# Patient Record
Sex: Male | Born: 1955
Health system: Southern US, Community
[De-identification: ages and names within clinical notes are randomized; demographics above are authoritative.]

## PROBLEM LIST (undated history)

## (undated) DIAGNOSIS — I10 Essential (primary) hypertension: Secondary | ICD-10-CM

## (undated) DIAGNOSIS — M1990 Unspecified osteoarthritis, unspecified site: Secondary | ICD-10-CM

## (undated) DIAGNOSIS — K219 Gastro-esophageal reflux disease without esophagitis: Secondary | ICD-10-CM

## (undated) DIAGNOSIS — E611 Iron deficiency: Secondary | ICD-10-CM

## (undated) DIAGNOSIS — I471 Supraventricular tachycardia: Secondary | ICD-10-CM

## (undated) DIAGNOSIS — I4719 Other supraventricular tachycardia: Secondary | ICD-10-CM

## (undated) DIAGNOSIS — E1129 Type 2 diabetes mellitus with other diabetic kidney complication: Secondary | ICD-10-CM

## (undated) DIAGNOSIS — E1139 Type 2 diabetes mellitus with other diabetic ophthalmic complication: Secondary | ICD-10-CM

## (undated) DIAGNOSIS — E785 Hyperlipidemia, unspecified: Secondary | ICD-10-CM

## (undated) DIAGNOSIS — I428 Other cardiomyopathies: Secondary | ICD-10-CM

## (undated) DIAGNOSIS — I509 Heart failure, unspecified: Secondary | ICD-10-CM

## (undated) DIAGNOSIS — N189 Chronic kidney disease, unspecified: Secondary | ICD-10-CM

## (undated) HISTORY — DX: Type 2 diabetes mellitus with other diabetic kidney complication: E11.29

## (undated) HISTORY — DX: Chronic kidney disease, unspecified: N18.9

## (undated) HISTORY — DX: Other cardiomyopathies: I42.8

## (undated) HISTORY — DX: Gastro-esophageal reflux disease without esophagitis: K21.9

## (undated) HISTORY — DX: Essential (primary) hypertension: I10

## (undated) HISTORY — DX: Other supraventricular tachycardia: I47.19

## (undated) HISTORY — DX: Hyperlipidemia, unspecified: E78.5

## (undated) HISTORY — DX: Supraventricular tachycardia: I47.1

## (undated) HISTORY — DX: Type 2 diabetes mellitus with other diabetic ophthalmic complication: E11.39

---
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
## 2001-01-14 ENCOUNTER — Emergency Department (HOSPITAL_COMMUNITY): Admission: EM | Admit: 2001-01-14 | Discharge: 2001-01-14 | Payer: Self-pay | Admitting: Emergency Medicine

## 2001-12-22 ENCOUNTER — Encounter: Admission: RE | Admit: 2001-12-22 | Discharge: 2001-12-22 | Payer: Self-pay | Admitting: Internal Medicine

## 2002-01-12 ENCOUNTER — Encounter: Admission: RE | Admit: 2002-01-12 | Discharge: 2002-01-12 | Payer: Self-pay | Admitting: Internal Medicine

## 2002-02-16 ENCOUNTER — Encounter: Admission: RE | Admit: 2002-02-16 | Discharge: 2002-02-16 | Payer: Self-pay | Admitting: Internal Medicine

## 2002-03-27 ENCOUNTER — Encounter: Admission: RE | Admit: 2002-03-27 | Discharge: 2002-03-27 | Payer: Self-pay | Admitting: Internal Medicine

## 2003-02-24 ENCOUNTER — Encounter: Admission: RE | Admit: 2003-02-24 | Discharge: 2003-02-24 | Payer: Self-pay | Admitting: Internal Medicine

## 2004-10-03 ENCOUNTER — Emergency Department (HOSPITAL_COMMUNITY): Admission: EM | Admit: 2004-10-03 | Discharge: 2004-10-03 | Payer: Self-pay | Admitting: Family Medicine

## 2004-10-04 ENCOUNTER — Emergency Department (HOSPITAL_COMMUNITY): Admission: EM | Admit: 2004-10-04 | Discharge: 2004-10-04 | Payer: Self-pay | Admitting: Family Medicine

## 2004-12-25 ENCOUNTER — Ambulatory Visit: Payer: Self-pay | Admitting: Internal Medicine

## 2004-12-29 ENCOUNTER — Ambulatory Visit: Payer: Self-pay | Admitting: Internal Medicine

## 2005-01-16 ENCOUNTER — Ambulatory Visit: Payer: Self-pay | Admitting: Internal Medicine

## 2006-04-22 DIAGNOSIS — E1165 Type 2 diabetes mellitus with hyperglycemia: Secondary | ICD-10-CM | POA: Insufficient documentation

## 2006-04-22 DIAGNOSIS — I428 Other cardiomyopathies: Secondary | ICD-10-CM | POA: Insufficient documentation

## 2006-04-22 DIAGNOSIS — E118 Type 2 diabetes mellitus with unspecified complications: Secondary | ICD-10-CM

## 2006-04-22 DIAGNOSIS — I10 Essential (primary) hypertension: Secondary | ICD-10-CM | POA: Insufficient documentation

## 2006-04-22 HISTORY — DX: Other cardiomyopathies: I42.8

## 2006-05-04 HISTORY — PX: CARDIAC CATHETERIZATION: SHX172

## 2006-05-10 ENCOUNTER — Inpatient Hospital Stay (HOSPITAL_COMMUNITY): Admission: EM | Admit: 2006-05-10 | Discharge: 2006-05-15 | Payer: Self-pay | Admitting: Family Medicine

## 2006-05-10 ENCOUNTER — Ambulatory Visit: Payer: Self-pay | Admitting: Internal Medicine

## 2006-05-13 ENCOUNTER — Encounter: Payer: Self-pay | Admitting: Vascular Surgery

## 2006-05-13 ENCOUNTER — Encounter: Payer: Self-pay | Admitting: Cardiology

## 2006-05-14 ENCOUNTER — Ambulatory Visit: Payer: Self-pay

## 2006-05-17 ENCOUNTER — Ambulatory Visit: Payer: Self-pay | Admitting: Internal Medicine

## 2006-05-22 ENCOUNTER — Encounter (INDEPENDENT_AMBULATORY_CARE_PROVIDER_SITE_OTHER): Payer: Self-pay | Admitting: *Deleted

## 2006-05-22 ENCOUNTER — Ambulatory Visit: Payer: Self-pay | Admitting: *Deleted

## 2006-05-22 LAB — CONVERTED CEMR LAB
CO2: 27 meq/L (ref 19–32)
Calcium: 9.8 mg/dL (ref 8.4–10.5)
Glucose, Bld: 180 mg/dL — ABNORMAL HIGH (ref 70–99)
Potassium: 4.1 meq/L (ref 3.5–5.3)

## 2006-05-23 ENCOUNTER — Ambulatory Visit: Payer: Self-pay | Admitting: Internal Medicine

## 2006-06-08 DIAGNOSIS — I471 Supraventricular tachycardia: Secondary | ICD-10-CM | POA: Insufficient documentation

## 2006-06-08 DIAGNOSIS — E785 Hyperlipidemia, unspecified: Secondary | ICD-10-CM | POA: Insufficient documentation

## 2006-06-08 DIAGNOSIS — K219 Gastro-esophageal reflux disease without esophagitis: Secondary | ICD-10-CM | POA: Insufficient documentation

## 2006-06-08 DIAGNOSIS — E1129 Type 2 diabetes mellitus with other diabetic kidney complication: Secondary | ICD-10-CM | POA: Insufficient documentation

## 2006-06-08 HISTORY — DX: Type 2 diabetes mellitus with other diabetic kidney complication: E11.29

## 2006-06-12 ENCOUNTER — Encounter (INDEPENDENT_AMBULATORY_CARE_PROVIDER_SITE_OTHER): Payer: Self-pay | Admitting: Internal Medicine

## 2006-06-24 ENCOUNTER — Ambulatory Visit: Payer: Self-pay | Admitting: Internal Medicine

## 2006-06-28 ENCOUNTER — Ambulatory Visit (HOSPITAL_COMMUNITY): Admission: RE | Admit: 2006-06-28 | Discharge: 2006-06-28 | Payer: Self-pay | Admitting: Internal Medicine

## 2006-06-28 ENCOUNTER — Ambulatory Visit: Payer: Self-pay | Admitting: Internal Medicine

## 2006-07-11 ENCOUNTER — Ambulatory Visit: Payer: Self-pay | Admitting: Internal Medicine

## 2006-07-15 ENCOUNTER — Ambulatory Visit: Payer: Self-pay | Admitting: Hospitalist

## 2006-07-17 ENCOUNTER — Encounter (INDEPENDENT_AMBULATORY_CARE_PROVIDER_SITE_OTHER): Payer: Self-pay | Admitting: *Deleted

## 2006-07-25 ENCOUNTER — Ambulatory Visit: Payer: Self-pay | Admitting: Internal Medicine

## 2006-07-25 LAB — CONVERTED CEMR LAB
BUN: 15 mg/dL (ref 6–23)
Basophils Absolute: 0.1 10*3/uL (ref 0.0–0.1)
Creatinine, Ser: 1.1 mg/dL (ref 0.4–1.5)
Eosinophils Absolute: 1.6 10*3/uL — ABNORMAL HIGH (ref 0.0–0.6)
GFR calc non Af Amer: 74 mL/min
MCHC: 33.3 g/dL (ref 30.0–36.0)
Monocytes Absolute: 1 10*3/uL — ABNORMAL HIGH (ref 0.2–0.7)
Monocytes Relative: 8.4 % (ref 3.0–11.0)
Potassium: 4.5 meq/L (ref 3.5–5.1)
Prothrombin Time: 11.7 s (ref 10.0–14.0)
RBC: 4.58 M/uL (ref 4.22–5.81)
RDW: 13 % (ref 11.5–14.6)
aPTT: 30 s (ref 26.5–36.5)

## 2006-07-30 ENCOUNTER — Telehealth (INDEPENDENT_AMBULATORY_CARE_PROVIDER_SITE_OTHER): Payer: Self-pay | Admitting: Pharmacy Technician

## 2006-07-31 ENCOUNTER — Telehealth (INDEPENDENT_AMBULATORY_CARE_PROVIDER_SITE_OTHER): Payer: Self-pay | Admitting: Pharmacy Technician

## 2006-08-01 ENCOUNTER — Ambulatory Visit: Payer: Self-pay | Admitting: Internal Medicine

## 2006-08-01 ENCOUNTER — Ambulatory Visit (HOSPITAL_COMMUNITY): Admission: RE | Admit: 2006-08-01 | Discharge: 2006-08-01 | Payer: Self-pay | Admitting: Internal Medicine

## 2006-08-16 ENCOUNTER — Ambulatory Visit: Payer: Self-pay

## 2006-08-16 ENCOUNTER — Encounter: Payer: Self-pay | Admitting: Cardiology

## 2006-08-20 ENCOUNTER — Ambulatory Visit: Payer: Self-pay | Admitting: Internal Medicine

## 2006-11-20 ENCOUNTER — Ambulatory Visit: Payer: Self-pay | Admitting: Internal Medicine

## 2006-12-11 ENCOUNTER — Telehealth (INDEPENDENT_AMBULATORY_CARE_PROVIDER_SITE_OTHER): Payer: Self-pay | Admitting: *Deleted

## 2007-02-06 ENCOUNTER — Encounter (INDEPENDENT_AMBULATORY_CARE_PROVIDER_SITE_OTHER): Payer: Self-pay | Admitting: *Deleted

## 2007-02-06 ENCOUNTER — Ambulatory Visit: Payer: Self-pay | Admitting: Internal Medicine

## 2007-02-06 DIAGNOSIS — E1139 Type 2 diabetes mellitus with other diabetic ophthalmic complication: Secondary | ICD-10-CM | POA: Insufficient documentation

## 2007-02-06 HISTORY — DX: Type 2 diabetes mellitus with other diabetic ophthalmic complication: E11.39

## 2007-02-06 LAB — CONVERTED CEMR LAB
Blood Glucose, Fingerstick: 429
Hgb A1c MFr Bld: >14 %

## 2007-02-11 ENCOUNTER — Ambulatory Visit: Payer: Self-pay | Admitting: Internal Medicine

## 2007-02-11 ENCOUNTER — Encounter (INDEPENDENT_AMBULATORY_CARE_PROVIDER_SITE_OTHER): Payer: Self-pay | Admitting: *Deleted

## 2007-02-11 LAB — CONVERTED CEMR LAB
ALT: 29 units/L (ref 0–53)
AST: 15 units/L (ref 0–37)
Alkaline Phosphatase: 116 units/L (ref 39–117)
LDL Cholesterol: 203 mg/dL — ABNORMAL HIGH (ref 0–99)
Sodium: 139 meq/L (ref 135–145)
Total Bilirubin: 0.7 mg/dL (ref 0.3–1.2)
Total CHOL/HDL Ratio: 5.4
Total Protein: 7.2 g/dL (ref 6.0–8.3)
VLDL: 45 mg/dL — ABNORMAL HIGH (ref 0–40)

## 2007-02-17 ENCOUNTER — Emergency Department (HOSPITAL_COMMUNITY): Admission: EM | Admit: 2007-02-17 | Discharge: 2007-02-17 | Payer: Self-pay | Admitting: Family Medicine

## 2007-06-18 ENCOUNTER — Ambulatory Visit: Payer: Self-pay | Admitting: Internal Medicine

## 2007-06-18 LAB — CONVERTED CEMR LAB
ALT: 21 units/L (ref 0–53)
AST: 21 units/L (ref 0–37)
Albumin: 3.8 g/dL (ref 3.5–5.2)
Bilirubin, Direct: 0.1 mg/dL (ref 0.0–0.3)
Calcium: 10.1 mg/dL (ref 8.4–10.5)
Chloride: 97 meq/L (ref 96–112)
Direct LDL: 247.6 mg/dL
GFR calc Af Amer: 81 mL/min
GFR calc non Af Amer: 67 mL/min
Glucose, Bld: 139 mg/dL — ABNORMAL HIGH (ref 70–99)
Hgb A1c MFr Bld: 12.9 % — ABNORMAL HIGH (ref 4.6–6.0)
Total CHOL/HDL Ratio: 8.3
Triglycerides: 281 mg/dL (ref 0–149)
VLDL: 56 mg/dL — ABNORMAL HIGH (ref 0–40)

## 2007-08-04 ENCOUNTER — Telehealth (INDEPENDENT_AMBULATORY_CARE_PROVIDER_SITE_OTHER): Payer: Self-pay | Admitting: *Deleted

## 2007-09-29 ENCOUNTER — Encounter (INDEPENDENT_AMBULATORY_CARE_PROVIDER_SITE_OTHER): Payer: Self-pay | Admitting: *Deleted

## 2008-01-22 ENCOUNTER — Ambulatory Visit: Payer: Self-pay | Admitting: Internal Medicine

## 2008-02-17 ENCOUNTER — Telehealth (INDEPENDENT_AMBULATORY_CARE_PROVIDER_SITE_OTHER): Payer: Self-pay | Admitting: Internal Medicine

## 2008-02-17 ENCOUNTER — Ambulatory Visit: Payer: Self-pay | Admitting: Gastroenterology

## 2008-02-23 ENCOUNTER — Ambulatory Visit: Payer: Self-pay | Admitting: Internal Medicine

## 2008-02-26 ENCOUNTER — Encounter: Payer: Self-pay | Admitting: Gastroenterology

## 2008-02-26 ENCOUNTER — Ambulatory Visit: Payer: Self-pay | Admitting: Gastroenterology

## 2008-02-26 ENCOUNTER — Encounter (INDEPENDENT_AMBULATORY_CARE_PROVIDER_SITE_OTHER): Payer: Self-pay | Admitting: Internal Medicine

## 2008-03-02 ENCOUNTER — Encounter: Payer: Self-pay | Admitting: Gastroenterology

## 2008-03-05 ENCOUNTER — Telehealth: Payer: Self-pay | Admitting: Internal Medicine

## 2008-04-19 ENCOUNTER — Ambulatory Visit: Payer: Self-pay | Admitting: Internal Medicine

## 2008-04-19 ENCOUNTER — Inpatient Hospital Stay (HOSPITAL_COMMUNITY): Admission: EM | Admit: 2008-04-19 | Discharge: 2008-04-26 | Payer: Self-pay | Admitting: Family Medicine

## 2008-04-19 LAB — CONVERTED CEMR LAB: Hgb A1c MFr Bld: 12 %

## 2008-04-20 ENCOUNTER — Ambulatory Visit: Payer: Self-pay | Admitting: Dentistry

## 2008-04-21 ENCOUNTER — Encounter (INDEPENDENT_AMBULATORY_CARE_PROVIDER_SITE_OTHER): Payer: Self-pay | Admitting: Internal Medicine

## 2008-04-21 ENCOUNTER — Ambulatory Visit: Payer: Self-pay | Admitting: Dentistry

## 2008-04-22 ENCOUNTER — Encounter (INDEPENDENT_AMBULATORY_CARE_PROVIDER_SITE_OTHER): Payer: Self-pay | Admitting: Internal Medicine

## 2008-05-03 ENCOUNTER — Encounter: Admission: RE | Admit: 2008-05-03 | Discharge: 2008-05-03 | Payer: Self-pay | Admitting: Dentistry

## 2008-05-04 ENCOUNTER — Telehealth (INDEPENDENT_AMBULATORY_CARE_PROVIDER_SITE_OTHER): Payer: Self-pay | Admitting: Internal Medicine

## 2008-05-04 ENCOUNTER — Ambulatory Visit: Payer: Self-pay | Admitting: Internal Medicine

## 2008-05-04 ENCOUNTER — Encounter (INDEPENDENT_AMBULATORY_CARE_PROVIDER_SITE_OTHER): Payer: Self-pay | Admitting: *Deleted

## 2008-05-04 LAB — CONVERTED CEMR LAB
Blood Glucose, Fingerstick: 330
CO2: 28 meq/L (ref 19–32)
Calcium: 9.6 mg/dL (ref 8.4–10.5)
Creatinine, Ser: 2.14 mg/dL — ABNORMAL HIGH (ref 0.40–1.50)
Creatinine, Urine: 89.3 mg/dL
Hemoglobin, Urine: NEGATIVE
Ketones, ur: NEGATIVE mg/dL
Leukocytes, UA: NEGATIVE
Microalb Creat Ratio: 314.7 mg/g — ABNORMAL HIGH (ref 0.0–30.0)
Microalb, Ur: 28.1 mg/dL — ABNORMAL HIGH (ref 0.00–1.89)
Nitrite: NEGATIVE
Protein, ur: 30 mg/dL — AB
Sodium: 134 meq/L — ABNORMAL LOW (ref 135–145)
Urobilinogen, UA: 0.2 (ref 0.0–1.0)
pH: 5.5 (ref 5.0–8.0)

## 2008-05-11 ENCOUNTER — Ambulatory Visit: Payer: Self-pay | Admitting: Internal Medicine

## 2008-05-13 ENCOUNTER — Telehealth (INDEPENDENT_AMBULATORY_CARE_PROVIDER_SITE_OTHER): Payer: Self-pay | Admitting: Internal Medicine

## 2008-05-20 ENCOUNTER — Encounter (INDEPENDENT_AMBULATORY_CARE_PROVIDER_SITE_OTHER): Payer: Self-pay | Admitting: Internal Medicine

## 2008-05-20 ENCOUNTER — Ambulatory Visit: Payer: Self-pay | Admitting: Internal Medicine

## 2008-05-20 ENCOUNTER — Encounter (INDEPENDENT_AMBULATORY_CARE_PROVIDER_SITE_OTHER): Payer: Self-pay | Admitting: *Deleted

## 2008-05-20 LAB — CONVERTED CEMR LAB
BUN: 41 mg/dL — ABNORMAL HIGH (ref 6–23)
CO2: 24 meq/L (ref 19–32)
Chloride: 102 meq/L (ref 96–112)
Glucose, Bld: 131 mg/dL — ABNORMAL HIGH (ref 70–99)
Potassium: 4.5 meq/L (ref 3.5–5.3)
Sodium: 139 meq/L (ref 135–145)

## 2008-05-31 ENCOUNTER — Ambulatory Visit: Payer: Self-pay | Admitting: Infectious Diseases

## 2008-05-31 ENCOUNTER — Encounter (INDEPENDENT_AMBULATORY_CARE_PROVIDER_SITE_OTHER): Payer: Self-pay | Admitting: Internal Medicine

## 2008-05-31 LAB — CONVERTED CEMR LAB: Blood Glucose, Fingerstick: 250

## 2008-06-02 ENCOUNTER — Encounter: Admission: RE | Admit: 2008-06-02 | Discharge: 2008-06-03 | Payer: Self-pay | Admitting: Internal Medicine

## 2008-06-16 ENCOUNTER — Ambulatory Visit: Payer: Self-pay | Admitting: Internal Medicine

## 2008-06-16 ENCOUNTER — Encounter (INDEPENDENT_AMBULATORY_CARE_PROVIDER_SITE_OTHER): Payer: Self-pay | Admitting: Internal Medicine

## 2008-06-16 LAB — CONVERTED CEMR LAB
Calcium: 9.8 mg/dL (ref 8.4–10.5)
Creatinine, Urine: 90.9 mg/dL
Glucose, Bld: 229 mg/dL — ABNORMAL HIGH (ref 70–99)
Hgb A1c MFr Bld: 9.9 %
Microalb, Ur: 9.07 mg/dL — ABNORMAL HIGH (ref 0.00–1.89)
Potassium: 4.1 meq/L (ref 3.5–5.3)
Sodium: 143 meq/L (ref 135–145)

## 2008-06-23 ENCOUNTER — Ambulatory Visit: Payer: Self-pay | Admitting: Internal Medicine

## 2008-06-23 ENCOUNTER — Encounter (INDEPENDENT_AMBULATORY_CARE_PROVIDER_SITE_OTHER): Payer: Self-pay | Admitting: Internal Medicine

## 2008-06-25 ENCOUNTER — Encounter (INDEPENDENT_AMBULATORY_CARE_PROVIDER_SITE_OTHER): Payer: Self-pay | Admitting: *Deleted

## 2008-06-25 ENCOUNTER — Encounter (INDEPENDENT_AMBULATORY_CARE_PROVIDER_SITE_OTHER): Payer: Self-pay | Admitting: Internal Medicine

## 2008-06-25 LAB — CONVERTED CEMR LAB
BUN: 42 mg/dL — ABNORMAL HIGH (ref 6–23)
Calcium: 8.8 mg/dL (ref 8.4–10.5)
Glucose, Bld: 220 mg/dL — ABNORMAL HIGH (ref 70–99)
Potassium: 4.3 meq/L (ref 3.5–5.3)
Sodium: 140 meq/L (ref 135–145)

## 2008-06-30 ENCOUNTER — Ambulatory Visit: Payer: Self-pay | Admitting: Internal Medicine

## 2008-06-30 ENCOUNTER — Encounter (INDEPENDENT_AMBULATORY_CARE_PROVIDER_SITE_OTHER): Payer: Self-pay | Admitting: Internal Medicine

## 2008-06-30 LAB — CONVERTED CEMR LAB
BUN: 46 mg/dL — ABNORMAL HIGH (ref 6–23)
Blood Glucose, Fingerstick: 231
CO2: 22 meq/L (ref 19–32)
Calcium: 9.1 mg/dL (ref 8.4–10.5)
Creatinine, Ser: 1.92 mg/dL — ABNORMAL HIGH (ref 0.40–1.50)

## 2008-07-12 ENCOUNTER — Ambulatory Visit: Payer: Self-pay | Admitting: Internal Medicine

## 2008-07-12 LAB — CONVERTED CEMR LAB

## 2008-07-14 ENCOUNTER — Encounter (INDEPENDENT_AMBULATORY_CARE_PROVIDER_SITE_OTHER): Payer: Self-pay | Admitting: Internal Medicine

## 2008-07-19 ENCOUNTER — Ambulatory Visit: Payer: Self-pay | Admitting: *Deleted

## 2008-07-29 ENCOUNTER — Encounter (INDEPENDENT_AMBULATORY_CARE_PROVIDER_SITE_OTHER): Payer: Self-pay | Admitting: Internal Medicine

## 2008-07-29 ENCOUNTER — Ambulatory Visit: Payer: Self-pay | Admitting: Internal Medicine

## 2008-07-29 LAB — CONVERTED CEMR LAB
BUN: 29 mg/dL — ABNORMAL HIGH (ref 6–23)
CO2: 23 meq/L (ref 19–32)
Chloride: 107 meq/L (ref 96–112)
Creatinine, Ser: 1.55 mg/dL — ABNORMAL HIGH (ref 0.40–1.50)
Glucose, Bld: 62 mg/dL — ABNORMAL LOW (ref 70–99)

## 2008-08-18 ENCOUNTER — Encounter (INDEPENDENT_AMBULATORY_CARE_PROVIDER_SITE_OTHER): Payer: Self-pay | Admitting: Internal Medicine

## 2008-08-19 ENCOUNTER — Ambulatory Visit: Payer: Self-pay | Admitting: Internal Medicine

## 2008-08-23 ENCOUNTER — Ambulatory Visit: Payer: Self-pay | Admitting: Internal Medicine

## 2008-08-23 ENCOUNTER — Encounter (INDEPENDENT_AMBULATORY_CARE_PROVIDER_SITE_OTHER): Payer: Self-pay | Admitting: Internal Medicine

## 2008-08-23 LAB — CONVERTED CEMR LAB
BUN: 42 mg/dL — ABNORMAL HIGH (ref 6–23)
Blood Glucose, Fingerstick: 155
CO2: 23 meq/L (ref 19–32)
Calcium: 9.3 mg/dL (ref 8.4–10.5)
Creatinine, Ser: 1.84 mg/dL — ABNORMAL HIGH (ref 0.40–1.50)
Glucose, Bld: 149 mg/dL — ABNORMAL HIGH (ref 70–99)
Total Bilirubin: 0.3 mg/dL (ref 0.3–1.2)

## 2008-08-25 ENCOUNTER — Telehealth (INDEPENDENT_AMBULATORY_CARE_PROVIDER_SITE_OTHER): Payer: Self-pay | Admitting: Internal Medicine

## 2008-09-01 ENCOUNTER — Encounter (INDEPENDENT_AMBULATORY_CARE_PROVIDER_SITE_OTHER): Payer: Self-pay | Admitting: Internal Medicine

## 2008-09-06 ENCOUNTER — Telehealth (INDEPENDENT_AMBULATORY_CARE_PROVIDER_SITE_OTHER): Payer: Self-pay | Admitting: Internal Medicine

## 2008-10-14 ENCOUNTER — Telehealth (INDEPENDENT_AMBULATORY_CARE_PROVIDER_SITE_OTHER): Payer: Self-pay | Admitting: Internal Medicine

## 2008-11-02 ENCOUNTER — Telehealth (INDEPENDENT_AMBULATORY_CARE_PROVIDER_SITE_OTHER): Payer: Self-pay | Admitting: Internal Medicine

## 2008-12-10 ENCOUNTER — Telehealth (INDEPENDENT_AMBULATORY_CARE_PROVIDER_SITE_OTHER): Payer: Self-pay | Admitting: Internal Medicine

## 2008-12-22 ENCOUNTER — Encounter (INDEPENDENT_AMBULATORY_CARE_PROVIDER_SITE_OTHER): Payer: Self-pay | Admitting: Internal Medicine

## 2008-12-22 ENCOUNTER — Ambulatory Visit: Payer: Self-pay | Admitting: Internal Medicine

## 2008-12-22 LAB — CONVERTED CEMR LAB
Blood Glucose, Fingerstick: 307
Creatinine, Urine: 218.6 mg/dL
Hgb A1c MFr Bld: 9.5 %
Microalb Creat Ratio: 45.5 mg/g — ABNORMAL HIGH (ref 0.0–30.0)
Microalb, Ur: 9.95 mg/dL — ABNORMAL HIGH (ref 0.00–1.89)

## 2008-12-29 ENCOUNTER — Ambulatory Visit: Payer: Self-pay | Admitting: Internal Medicine

## 2008-12-29 ENCOUNTER — Encounter (INDEPENDENT_AMBULATORY_CARE_PROVIDER_SITE_OTHER): Payer: Self-pay | Admitting: Internal Medicine

## 2008-12-31 LAB — CONVERTED CEMR LAB
ALT: 12 units/L (ref 0–53)
AST: 10 units/L (ref 0–37)
BUN: 17 mg/dL (ref 6–23)
Creatinine, Ser: 1.21 mg/dL (ref 0.40–1.50)
HDL: 48 mg/dL (ref >39)
TSH: 1.206 microintl units/mL (ref 0.350–4.5)
Total Bilirubin: 0.3 mg/dL (ref 0.3–1.2)
Total CHOL/HDL Ratio: 5.9
VLDL: 29 mg/dL (ref 0–40)

## 2009-01-05 ENCOUNTER — Ambulatory Visit: Payer: Self-pay | Admitting: Internal Medicine

## 2009-01-05 ENCOUNTER — Encounter (INDEPENDENT_AMBULATORY_CARE_PROVIDER_SITE_OTHER): Payer: Self-pay | Admitting: Internal Medicine

## 2009-01-05 LAB — CONVERTED CEMR LAB: Blood Glucose, Home Monitor: 3 mg/dL

## 2009-01-06 ENCOUNTER — Telehealth (INDEPENDENT_AMBULATORY_CARE_PROVIDER_SITE_OTHER): Payer: Self-pay | Admitting: Internal Medicine

## 2009-01-24 ENCOUNTER — Ambulatory Visit: Payer: Self-pay | Admitting: Internal Medicine

## 2009-01-31 ENCOUNTER — Encounter (INDEPENDENT_AMBULATORY_CARE_PROVIDER_SITE_OTHER): Payer: Self-pay | Admitting: Internal Medicine

## 2009-02-14 ENCOUNTER — Telehealth (INDEPENDENT_AMBULATORY_CARE_PROVIDER_SITE_OTHER): Payer: Self-pay | Admitting: Internal Medicine

## 2009-02-16 ENCOUNTER — Encounter (INDEPENDENT_AMBULATORY_CARE_PROVIDER_SITE_OTHER): Payer: Self-pay | Admitting: *Deleted

## 2009-02-21 ENCOUNTER — Telehealth: Payer: Self-pay | Admitting: Internal Medicine

## 2009-03-04 ENCOUNTER — Encounter (INDEPENDENT_AMBULATORY_CARE_PROVIDER_SITE_OTHER): Payer: Self-pay | Admitting: Internal Medicine

## 2009-03-10 ENCOUNTER — Telehealth: Payer: Self-pay | Admitting: Internal Medicine

## 2009-03-15 ENCOUNTER — Ambulatory Visit (HOSPITAL_COMMUNITY): Admission: RE | Admit: 2009-03-15 | Discharge: 2009-03-15 | Payer: Self-pay | Admitting: Internal Medicine

## 2009-03-15 ENCOUNTER — Ambulatory Visit: Payer: Self-pay | Admitting: Internal Medicine

## 2009-03-15 LAB — CONVERTED CEMR LAB
Blood Glucose, Fingerstick: 380
Hgb A1c MFr Bld: 9.6 %

## 2009-03-16 DIAGNOSIS — M1990 Unspecified osteoarthritis, unspecified site: Secondary | ICD-10-CM | POA: Insufficient documentation

## 2009-03-21 ENCOUNTER — Encounter (INDEPENDENT_AMBULATORY_CARE_PROVIDER_SITE_OTHER): Payer: Self-pay | Admitting: Internal Medicine

## 2009-03-23 ENCOUNTER — Ambulatory Visit: Payer: Self-pay | Admitting: Internal Medicine

## 2009-03-23 ENCOUNTER — Telehealth (INDEPENDENT_AMBULATORY_CARE_PROVIDER_SITE_OTHER): Payer: Self-pay | Admitting: Internal Medicine

## 2009-03-24 ENCOUNTER — Encounter (INDEPENDENT_AMBULATORY_CARE_PROVIDER_SITE_OTHER): Payer: Self-pay | Admitting: Internal Medicine

## 2009-03-29 ENCOUNTER — Ambulatory Visit: Payer: Self-pay | Admitting: Internal Medicine

## 2009-03-29 DIAGNOSIS — E669 Obesity, unspecified: Secondary | ICD-10-CM | POA: Insufficient documentation

## 2009-03-29 LAB — CONVERTED CEMR LAB
AST: 19 units/L (ref 0–37)
Albumin: 4.3 g/dL (ref 3.5–5.2)
BUN: 26 mg/dL — ABNORMAL HIGH (ref 6–23)
Calcium: 10.1 mg/dL (ref 8.4–10.5)
Chloride: 104 meq/L (ref 96–112)
Creatinine, Ser: 1.41 mg/dL (ref 0.40–1.50)
Glucose, Bld: 62 mg/dL — ABNORMAL LOW (ref 70–99)
HDL: 46 mg/dL (ref >39)
Potassium: 4.1 meq/L (ref 3.5–5.3)
Total CHOL/HDL Ratio: 3.5
Triglycerides: 131 mg/dL (ref <150)

## 2009-03-31 ENCOUNTER — Encounter (INDEPENDENT_AMBULATORY_CARE_PROVIDER_SITE_OTHER): Payer: Self-pay | Admitting: Internal Medicine

## 2009-04-01 ENCOUNTER — Telehealth (INDEPENDENT_AMBULATORY_CARE_PROVIDER_SITE_OTHER): Payer: Self-pay | Admitting: Internal Medicine

## 2009-04-22 ENCOUNTER — Telehealth (INDEPENDENT_AMBULATORY_CARE_PROVIDER_SITE_OTHER): Payer: Self-pay | Admitting: Internal Medicine

## 2009-05-31 ENCOUNTER — Telehealth (INDEPENDENT_AMBULATORY_CARE_PROVIDER_SITE_OTHER): Payer: Self-pay | Admitting: Internal Medicine

## 2009-06-01 ENCOUNTER — Telehealth (INDEPENDENT_AMBULATORY_CARE_PROVIDER_SITE_OTHER): Payer: Self-pay | Admitting: Internal Medicine

## 2009-11-29 ENCOUNTER — Telehealth (INDEPENDENT_AMBULATORY_CARE_PROVIDER_SITE_OTHER): Payer: Self-pay | Admitting: *Deleted

## 2009-12-27 ENCOUNTER — Telehealth (INDEPENDENT_AMBULATORY_CARE_PROVIDER_SITE_OTHER): Payer: Self-pay | Admitting: *Deleted

## 2010-01-26 ENCOUNTER — Telehealth: Payer: Self-pay | Admitting: Internal Medicine

## 2010-02-02 ENCOUNTER — Encounter: Payer: Self-pay | Admitting: Internal Medicine

## 2010-02-20 ENCOUNTER — Ambulatory Visit: Payer: Self-pay | Admitting: Internal Medicine

## 2010-02-20 LAB — CONVERTED CEMR LAB
Blood Glucose, Fingerstick: 347
Hgb A1c MFr Bld: 8.6 %

## 2010-02-22 ENCOUNTER — Ambulatory Visit: Payer: Self-pay | Admitting: Internal Medicine

## 2010-02-24 ENCOUNTER — Encounter: Payer: Self-pay | Admitting: Internal Medicine

## 2010-02-24 LAB — CONVERTED CEMR LAB
ALT: 13 units/L (ref 0–53)
AST: 12 units/L (ref 0–37)
CO2: 26 meq/L (ref 19–32)
Calcium: 9.6 mg/dL (ref 8.4–10.5)
Chloride: 99 meq/L (ref 96–112)
Cholesterol: 341 mg/dL — ABNORMAL HIGH (ref 0–200)
Creatinine, Ser: 1.31 mg/dL (ref 0.40–1.50)
Creatinine, Urine: 163.2 mg/dL
HCT: 43.8 % (ref 39.0–52.0)
Microalb Creat Ratio: 1192.6 mg/g — ABNORMAL HIGH (ref 0.0–30.0)
Microalb, Ur: 194.64 mg/dL — ABNORMAL HIGH (ref 0.00–1.89)
Platelets: 192 10*3/uL (ref 150–400)
Potassium: 4.7 meq/L (ref 3.5–5.3)
RDW: 12.9 % (ref 11.5–15.5)
Sodium: 137 meq/L (ref 135–145)
Total CHOL/HDL Ratio: 7.6
Total Protein: 7.1 g/dL (ref 6.0–8.3)
WBC: 7.3 10*3/uL (ref 4.0–10.5)

## 2010-03-06 ENCOUNTER — Encounter: Payer: Self-pay | Admitting: Internal Medicine

## 2010-03-17 ENCOUNTER — Ambulatory Visit: Payer: Self-pay | Admitting: Internal Medicine

## 2010-03-17 LAB — CONVERTED CEMR LAB: Blood Glucose, Fingerstick: 239

## 2010-03-18 ENCOUNTER — Encounter: Payer: Self-pay | Admitting: Internal Medicine

## 2010-03-21 ENCOUNTER — Telehealth: Payer: Self-pay | Admitting: *Deleted

## 2010-04-04 ENCOUNTER — Encounter: Payer: Self-pay | Admitting: Internal Medicine

## 2010-04-04 ENCOUNTER — Ambulatory Visit: Payer: Self-pay | Admitting: Internal Medicine

## 2010-04-04 LAB — CONVERTED CEMR LAB
ALT: 14 units/L (ref 0–53)
CO2: 27 meq/L (ref 19–32)
Calcium: 8.9 mg/dL (ref 8.4–10.5)
Chloride: 98 meq/L (ref 96–112)
Creatinine, Ser: 2.36 mg/dL — ABNORMAL HIGH (ref 0.40–1.50)
Glucose, Bld: 301 mg/dL — ABNORMAL HIGH (ref 70–99)
Sodium: 140 meq/L (ref 135–145)
Total Protein: 6.2 g/dL (ref 6.0–8.3)

## 2010-04-05 ENCOUNTER — Telehealth (INDEPENDENT_AMBULATORY_CARE_PROVIDER_SITE_OTHER): Payer: Self-pay | Admitting: *Deleted

## 2010-04-09 ENCOUNTER — Telehealth: Payer: Self-pay | Admitting: Internal Medicine

## 2010-04-10 ENCOUNTER — Ambulatory Visit: Payer: Self-pay | Admitting: Internal Medicine

## 2010-04-10 ENCOUNTER — Encounter: Payer: Self-pay | Admitting: Internal Medicine

## 2010-04-10 DIAGNOSIS — N179 Acute kidney failure, unspecified: Secondary | ICD-10-CM | POA: Insufficient documentation

## 2010-04-10 LAB — CONVERTED CEMR LAB
Blood Glucose, Fingerstick: 210
CO2: 26 meq/L (ref 19–32)
Calcium: 9.3 mg/dL (ref 8.4–10.5)
Calcium: 9.6 mg/dL (ref 8.4–10.5)
Creatinine, Ser: 1.22 mg/dL (ref 0.40–1.50)
Potassium: 4.2 meq/L (ref 3.5–5.3)
Sodium: 144 meq/L (ref 135–145)
Sodium: 140 meq/L (ref 135–145)

## 2010-04-13 ENCOUNTER — Ambulatory Visit: Payer: Self-pay | Admitting: Internal Medicine

## 2010-04-14 ENCOUNTER — Encounter: Payer: Self-pay | Admitting: Internal Medicine

## 2010-04-14 ENCOUNTER — Ambulatory Visit: Payer: Self-pay | Admitting: Cardiology

## 2010-04-14 ENCOUNTER — Ambulatory Visit (HOSPITAL_COMMUNITY): Admission: RE | Admit: 2010-04-14 | Discharge: 2010-04-14 | Payer: Self-pay | Admitting: Internal Medicine

## 2010-04-14 ENCOUNTER — Ambulatory Visit: Payer: Self-pay

## 2010-04-19 IMAGING — CR DG CHEST 2V
2 series · 2 of 2 positions shown · non-contrast
Comparison: 05/10/2006.

CLINICAL DATA: Facial abscess.  Preoperative evaluation.

CHEST - 2 VIEW

[w chest pa]
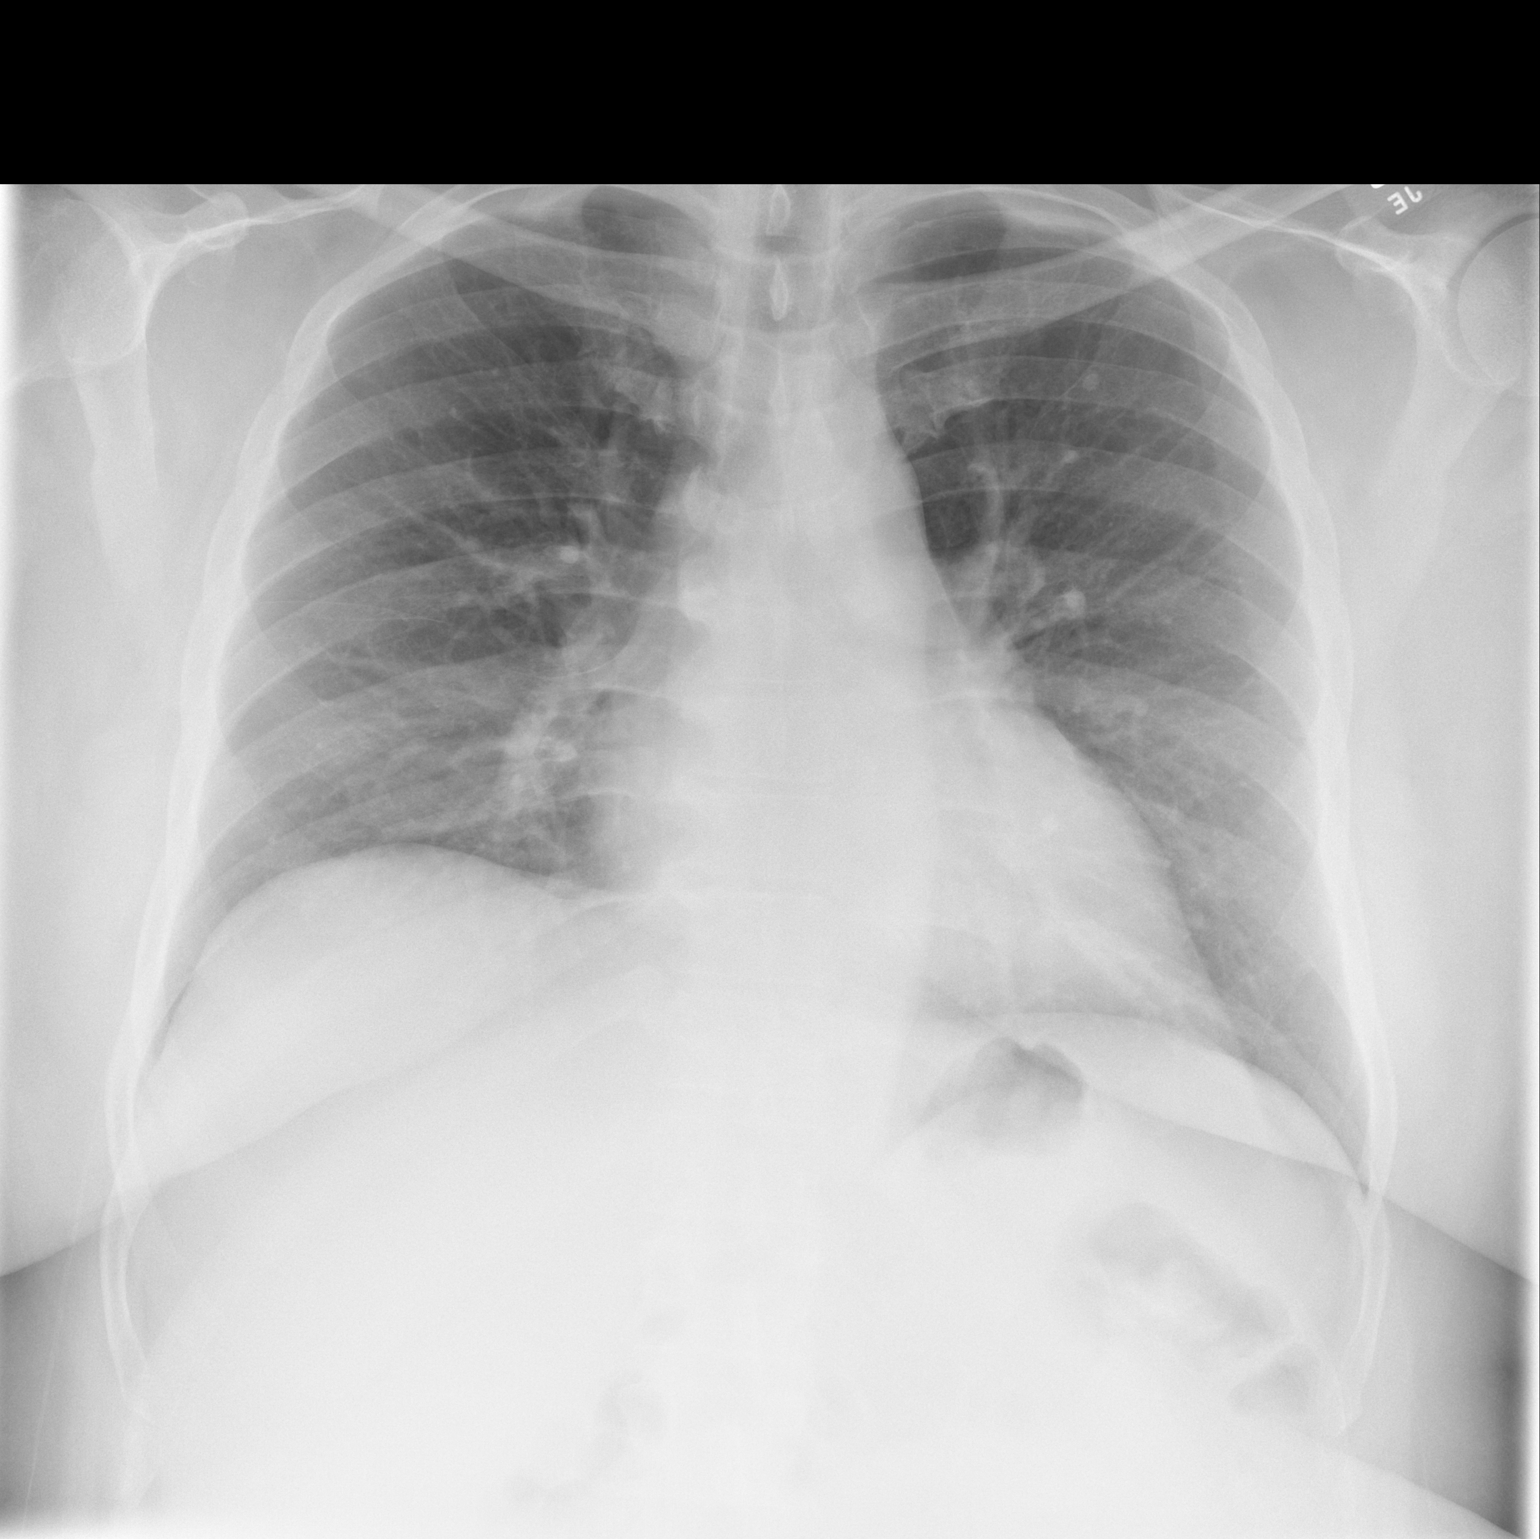

[w chest lat *]
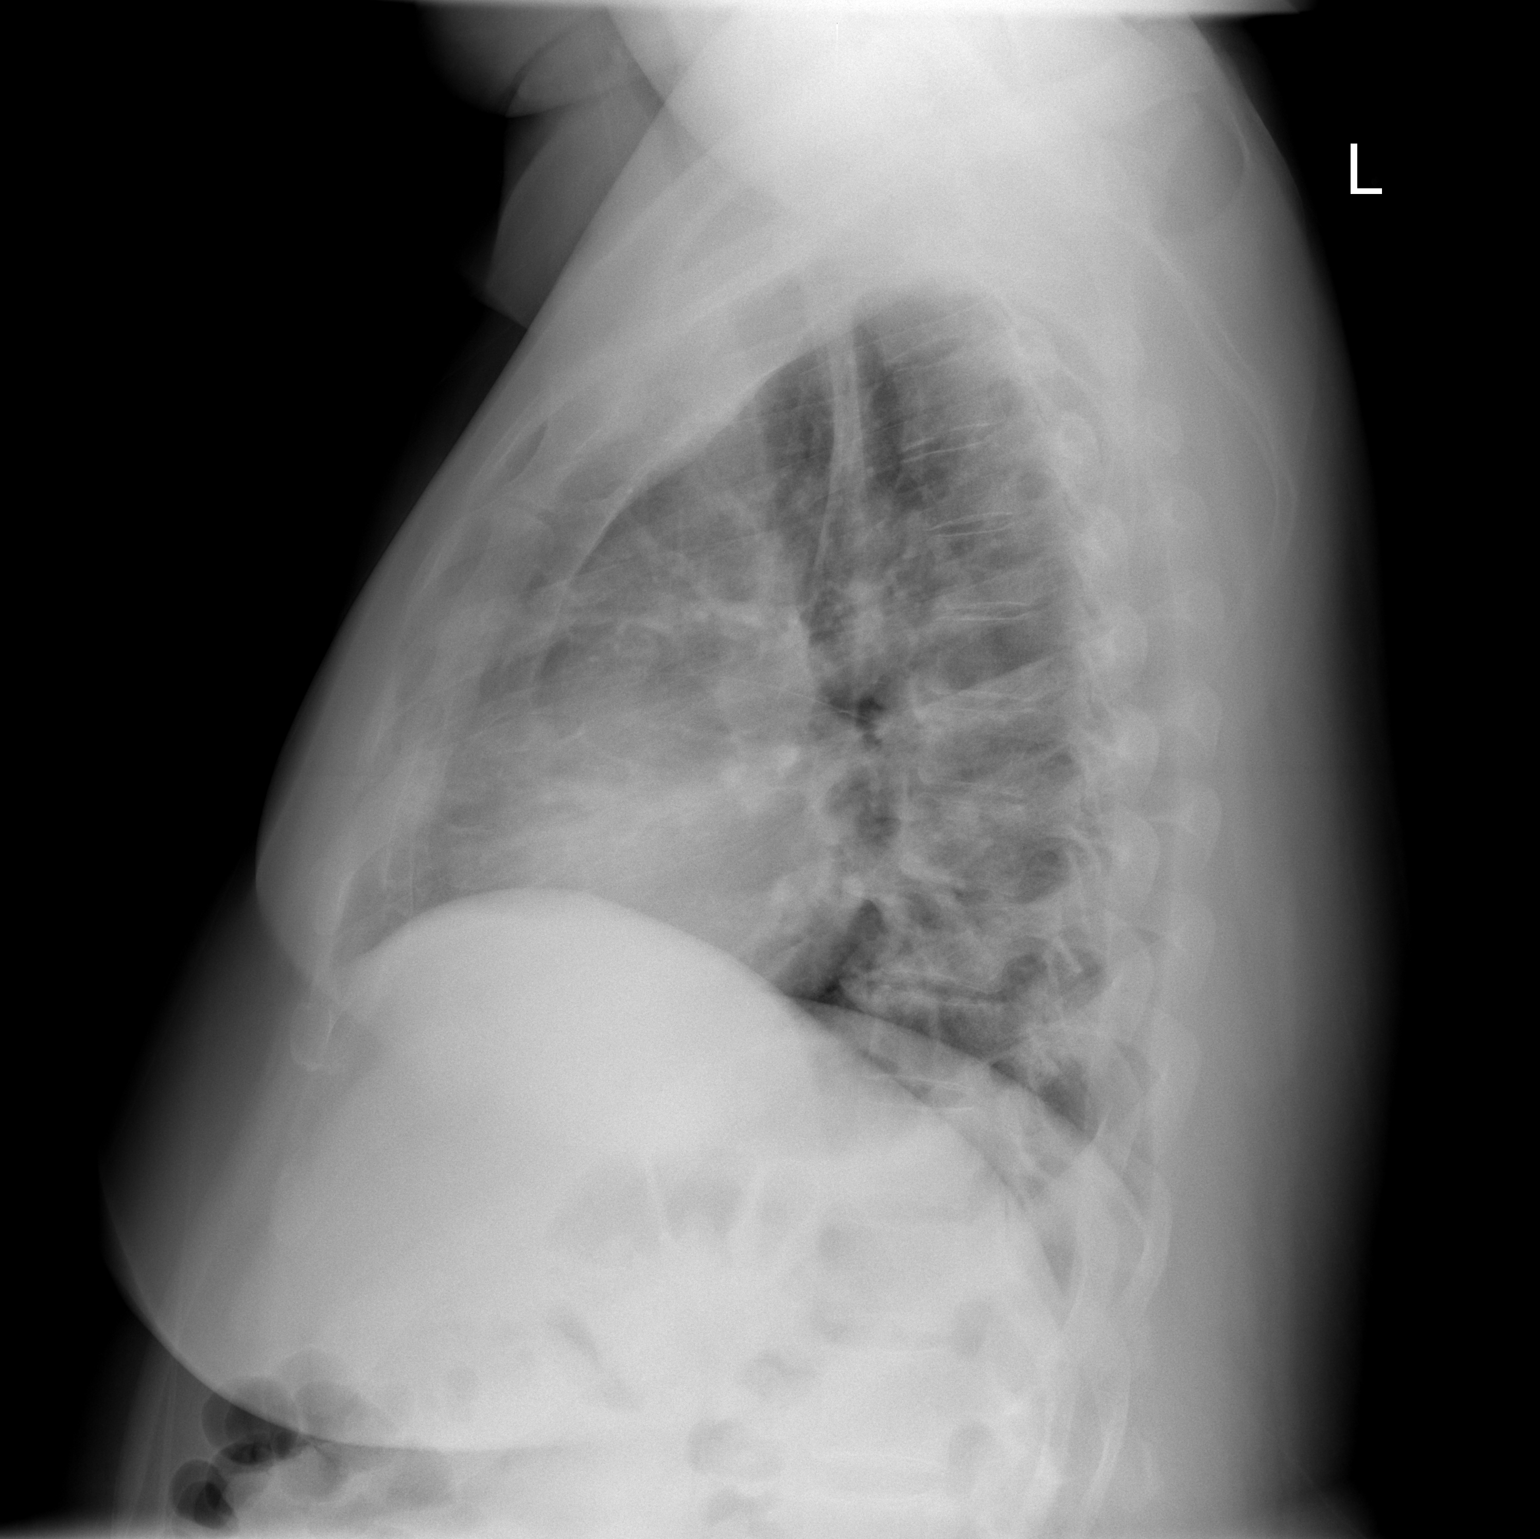

[2 of 2 positions shown; findings below may reference images not displayed]

FINDINGS: Borderline enlarged cardiac silhouette.  Clear lungs.
Mild central peribronchial thickening.  Thoracic spine degenerative
changes.
IMPRESSION: Borderline cardiomegaly and mild chronic bronchitic changes.  No
acute abnormality.

## 2010-04-21 ENCOUNTER — Ambulatory Visit: Payer: Self-pay | Admitting: Internal Medicine

## 2010-04-24 LAB — CONVERTED CEMR LAB
Chloride: 102 meq/L (ref 96–112)
Potassium: 4.2 meq/L (ref 3.5–5.3)

## 2010-05-05 ENCOUNTER — Ambulatory Visit: Payer: Self-pay | Admitting: Internal Medicine

## 2010-05-05 LAB — HM DIABETES FOOT EXAM

## 2010-05-09 LAB — CONVERTED CEMR LAB
BUN: 33 mg/dL — ABNORMAL HIGH (ref 6–23)
CO2: 28 meq/L (ref 19–32)
Calcium: 9.7 mg/dL (ref 8.4–10.5)
Creatinine, Ser: 1.44 mg/dL (ref 0.40–1.50)
Glucose, Bld: 159 mg/dL — ABNORMAL HIGH (ref 70–99)
Sodium: 138 meq/L (ref 135–145)

## 2010-05-15 ENCOUNTER — Telehealth: Payer: Self-pay | Admitting: Internal Medicine

## 2010-05-19 ENCOUNTER — Encounter: Payer: Self-pay | Admitting: Internal Medicine

## 2010-05-19 ENCOUNTER — Ambulatory Visit: Payer: Self-pay | Admitting: Internal Medicine

## 2010-05-19 LAB — CONVERTED CEMR LAB
CO2: 27 meq/L (ref 19–32)
Chloride: 105 meq/L (ref 96–112)
Creatinine, Ser: 1.28 mg/dL (ref 0.40–1.50)
OCCULT 3: NEGATIVE

## 2010-05-22 ENCOUNTER — Telehealth: Payer: Self-pay | Admitting: Internal Medicine

## 2010-05-30 ENCOUNTER — Ambulatory Visit: Payer: Self-pay | Admitting: Internal Medicine

## 2010-05-30 ENCOUNTER — Encounter: Payer: Self-pay | Admitting: Internal Medicine

## 2010-05-30 LAB — CONVERTED CEMR LAB
BUN: 25 mg/dL — ABNORMAL HIGH (ref 6–23)
Cholesterol: 247 mg/dL — ABNORMAL HIGH (ref 0–200)
Glucose, Bld: 92 mg/dL (ref 70–99)
LDL Cholesterol: 156 mg/dL — ABNORMAL HIGH (ref 0–99)
Potassium: 4.4 meq/L (ref 3.5–5.3)
VLDL: 41 mg/dL — ABNORMAL HIGH (ref 0–40)

## 2010-06-01 ENCOUNTER — Encounter: Payer: Self-pay | Admitting: Internal Medicine

## 2010-06-05 ENCOUNTER — Ambulatory Visit: Payer: Self-pay | Admitting: Internal Medicine

## 2010-06-26 ENCOUNTER — Ambulatory Visit: Admit: 2010-06-26 | Payer: Self-pay

## 2010-07-06 NOTE — Assessment & Plan Note (Signed)
Summary: EST-2 WEEK RECHECK FOR BP AND BMP/CH   Vital Signs:  Patient profile:   55 year old male Height:      71 inches (180.34 cm) Weight:      297.1 pounds (135.05 kg) BMI:     41.59 Temp:     98.7 degrees F (37.06 degrees C) oral Pulse rate:   70 / minute BP sitting:   176 / 85  (right arm) Cuff size:   large  Vitals Entered By: Morrison Old RN (March 17, 2010 3:27 PM) CC: Recheck BP. Is Patient Diabetic? Yes Did you bring your meter with you today? Yes Pain Assessment Patient in pain? no      Nutritional Status BMI of > 30 = obese CBG Result 239  Have you ever been in a relationship where you felt threatened, hurt or afraid?No   Does patient need assistance? Functional Status Self care Ambulation Normal   Primary Care Provider:  Rosalia Hammers MD  CC:  Recheck BP.Marland Kitchen  History of Present Illness: This is a 55 year old male with h/o  DM (including Diabetic retinopathy, Renal insufficiency), HTN, HLD , paroxysmal atrial tachycardia and Cardiomyopathy followed by Dr Caryl Comes who presents her for review of his medication since at the last visit it was not aware what he was taking.   1. DM : Today CBG 239, Hgb 8.6 on 9/19 .  We started patient on lantus 100 units  at the last visit to make his medication regimen easier. He noted that he just started it 1 week ago and was taking it in the morning.  Furthermore patient was advised to take Metformin 1000 mg two times a day . He was encouraged to take the medications and check blood glucose level on a regular basis and bring in his meds and his meter at the next visit to reevaluate for possible adjustment in his regimen. Patient brought his meter in but his last readin was on 9/30. He noted that the batteries were low and he did not get a chance to change it.  He did not brink his meds with him.   2. BP today 189/84, repeat 176/75. At the last visit BP was 144/82. Currently on Lasix 80mg   Carvedilol 25mg  and Lisinopril 5 mg which  was started at the last visit.  Patient noted that he is taking his medication on a regular basis. Patient has an appointment with his Cardiologist in November  3. HLD: Lipid panel 9/21 Chol 341, Trigl 195, LDL 257, HDL 45  Was started on Pravastatin 40 mg. Tolerating it well.      Depression History:      The patient denies a depressed mood most of the day and a diminished interest in his usual daily activities.         Preventive Screening-Counseling & Management  Alcohol-Tobacco     Alcohol type: very seldom     Smoking Status: never  Caffeine-Diet-Exercise     Caffeine use/day: tea     Does Patient Exercise: no     Type of exercise: walks on the job  Allergies: No Known Drug Allergies  Review of Systems  The patient denies anorexia, fever, weight loss, chest pain, dyspnea on exertion, peripheral edema, abdominal pain, and severe indigestion/heartburn.    Physical Exam  General:  Well-developed,well-nourished,in no acute distress; alert,appropriate and cooperative throughout examination Lungs:  Normal respiratory effort, chest expands symmetrically. Lungs are clear to auscultation, no crackles or wheezes. Heart:  Normal rate  and regular rhythm. S1 and S2 normal without gallop, murmur, click, rub or other extra sounds. Abdomen:  Bowel sounds positive,abdomen soft and non-tender without masses, organomegaly or hernias noted. Pulses:  R and L carotid,radial,dorsalis pedis and posterior tibial pulses are full and equal bilaterally Extremities:  No clubbing, cyanosis, edema, or deformity noted with normal full range of motion of all joints.   Psych:  Cognition and judgment appear intact. Alert and cooperative with normal attention span and concentration. No apparent delusions, illusions, hallucinations   Impression & Recommendations:  Problem # 1:  DIABETES MELLITUS, TYPE II (ICD-250.00) Patient was advised to continue on current regimen since there were no readings on his  Glucometer since 9/30 which means no information about glucose level on Lantus 100 units. Again  patient was  informed about the importance of taking his medication on a regular basis and was educated to check his blood sugars once a day before breakfast. He  was advised to bring his meter in 2 weeks to evaluate for possible changes in management. We considered to instruct patient to increase Lantus to 1 units every day until fasting blood glucose level were consistent around 130 but since patient has a history of non-compliance it was less likely that this would work for him for now. May consider it at the next visit if blood glucose level are not controlled. He may benefit from [facility] to assist with his mediation. Need to evaluate if he would be eligible.  His updated medication list for this problem includes:    Aspirin 81 Mg Tbec (Aspirin) .Marland Kitchen... Take 1 tablet by mouth once a day    Metformin Hcl 1000 Mg Tabs (Metformin hcl) .Marland Kitchen... Take 1 tablet by mouth two times a day.    Lisinopril 5 Mg Tabs (Lisinopril) .Marland Kitchen... Take one tablet by mouth once a day.    Lantus 100 Unit/ml Soln (Insulin glargine) .Marland Kitchen... Take 100 units once a day in the morning.  you can split 50 units and 50 units and inject it at two locations.  Orders: Capillary Blood Glucose/CBG GU:[id number])  Problem # 2:  HYPERTENSION (ICD-401.9) BP on current regimen uncontrolled. Will check Bmet today and consider if normal to increase Lisinopril to 20 mg once a day. Check Bmet in 2 weeks and evaluate at the next visit for possible changes.   His updated medication list for this problem includes:    Carvedilol 25 Mg Tabs (Carvedilol) .Marland Kitchen... Take 1 tablet by mouth two times a day    Furosemide 80 Mg Tabs (Furosemide) .Marland Kitchen... Take 1 pill by mouth daily, per dr. Caryl Comes    Lisinopril 5 Mg Tabs (Lisinopril) .Marland Kitchen... Take one tablet by mouth once a day.  Orders: T-Basic Metabolic Panel ([id number] Orders: T-Basic Metabolic Panel  ([id number]) ... 04/04/2010 T-CMP with Estimated GFR ([id number]) ... 04/04/2010  Problem # 3:  HYPERLIPIDEMIA (ICD-272.4)  Lipid panel 9/21 Chol 341, Trigl 195, LDL 257, HDL 45  Was started on Pravastatin 40 mg. Tolerating it well.  Continue on current regimen.  Consider to check LFTs in 2 weeks.   His updated medication list for this problem includes:    Pravastatin Sodium 40 Mg Tabs (Pravastatin sodium) .Marland Kitchen... Take one tablet by mouth once a day in the evening.  Future Orders: T-CMP with Estimated GFR ([id number]) ... 04/04/2010  Complete Medication List: 1)  Aspirin 81 Mg Tbec (Aspirin) .... Take 1 tablet by mouth once a day 2)  Carvedilol 25 Mg Tabs (Carvedilol) .... Take 1  tablet by mouth two times a day 3)  Cialis 10 Mg Tabs (Tadalafil) .... Take 1 tablet by mouth once a day as needed for sexual activity 4)  Bd Insulin Syringe 29g X 1/2" 1 Ml Misc (Insulin syringe-needle u-100) .... Use as directed 5)  Onetouch Ultra Test Strp (Glucose blood) .... Check blood sugars before each meal 6)  Onetouch Ultrasoft Lancets Misc (Lancets) .... Use to test your blood sugar before each meal each day 7)  Furosemide 80 Mg Tabs (Furosemide) .... Take 1 pill by mouth daily, per dr. Caryl Comes 8)  Metformin Hcl 1000 Mg Tabs (Metformin hcl) .... Take 1 tablet by mouth two times a day. 9)  Tramadol Hcl 50 Mg Tabs (Tramadol hcl) .... Take 1 pill by mouth three times a day as needed for pain. 10)  Onetouch Ultra Mini W/device Kit (Blood glucose monitoring suppl) .... Use to check blood sugar durng day at work before and after lunch 11)  Fish Oil 1200 Mg Caps (Omega-3 fatty acids) .... Taking one tablet once a day 12)  Osteo Bi-flex Regular Strength 250-200 Mg Tabs (Glucosamine-chondroitin) .... Taking 4 caplets per day with food. 13)  Lisinopril 5 Mg Tabs (Lisinopril) .... Take one tablet by mouth once a day. 14)  Lantus 100 Unit/ml Soln (Insulin glargine) .... Take 100 units once a day in the morning.  you  can split 50 units and 50 units and inject it at two locations. 15)  Pravastatin Sodium 40 Mg Tabs (Pravastatin sodium) .... Take one tablet by mouth once a day in the evening.  Patient Instructions: 1)  Please schedule a follow-up in 2 weeks for blood work and to download glucose meter.  2)  Check your blood sugars every morning before breakfast.  If your readings are usually above : or below 70 you should contact our office. 3)  Please schedule a follow-up appointment in 1 month with PCP.  4)  Limit your Sodium (Salt). 5)  It is important that you exercise regularly at least 20 minutes 5 times a week. If you develop chest pain, have severe difficulty breathing, or feel very tired , stop exercising immediately and seek medical attention. 6)  You need to lose weight. Consider a lower calorie diet and regular exercise.   Prevention & Chronic Care Immunizations   Influenza vaccine: Not documented   Influenza vaccine deferral: Deferred  (02/22/2010)    Tetanus booster: Not documented    Pneumococcal vaccine: Not documented   Pneumococcal vaccine deferral: Not indicated  (02/20/2010)  Colorectal Screening   Hemoccult: Negative  (12/29/2004)    Colonoscopy: Location:  [place].    (02/26/2008)   Colonoscopy due: 03/2013  Other Screening   PSA: Not documented   Smoking status: never  (03/17/2010)  Diabetes Mellitus   HgbA1C: 8.6  (02/20/2010)   HgbA1C action/deferral: Deferred  (01/24/2009)    Eye exam: Proliferative diabetic retinopathy.   Severe OU- referral to Reintal specialist Exam  by Monna Fam   (09/01/2008)   Diabetic eye exam action/deferral: Deferred  (01/24/2009)   Eye exam due: 12/2008    Foot exam: yes  (02/20/2010)   Foot exam action/deferral: Do today   High risk foot: No  (02/20/2010)   Foot care education: Done  (02/20/2010)   Foot exam due: 08/24/2010    Urine microalbumin/creatinine ratio: 1192.6  (02/22/2010)   Urine microalbumin  action/deferral: Ordered  Lipids   Total Cholesterol: 341  (02/22/2010)   Lipid panel action/deferral: Deferred  LDL: 257  (02/22/2010)   LDL Direct: 247.6  (06/18/2007)   HDL: 45  (02/22/2010)   Triglycerides: 195  (02/22/2010)    SGOT (AST): 12  (02/22/2010)   BMP action: Deferred   SGPT (ALT): 13  (02/22/2010)   Alkaline phosphatase: 104  (02/22/2010)   Total bilirubin: 0.5  (02/22/2010)  Hypertension   Last Blood Pressure: 176 / 85  (03/17/2010)   Serum creatinine: 1.31  (02/22/2010)   BMP action: Deferred   Serum potassium 4.7  (02/22/2010)  Self-Management Support :   Personal Goals (by the next clinic visit) :     Personal A1C goal: 7  (03/15/2009)     Personal blood pressure goal: 140/90  (03/15/2009)     Personal LDL goal: 70  (03/15/2009)    Patient will work on the following items until the next clinic visit to reach self-care goals:     Medications and monitoring: take my medicines every day, check my blood pressure, examine my feet every day  (03/17/2010)     Eating: drink diet soda or water instead of juice or soda, eat more vegetables, use fresh or frozen vegetables, eat foods that are low in salt, eat baked foods instead of fried foods, eat fruit for snacks and desserts  (03/17/2010)     Activity: take a 30 minute walk every day  (03/17/2010)     Other: walks a lot "on the job" - wants to start using stationary bike  (01/24/2009)    Diabetes self-management support: Copy of home glucose meter record, Written self-care plan  (03/17/2010)   Diabetes care plan printed   Last diabetes self-management training by diabetes educator: 03/23/2009   Last medical nutrition therapy: 05/13/2008    Hypertension self-management support: Written self-care plan  (03/17/2010)   Hypertension self-care plan printed.    Lipid self-management support: Written self-care plan  (03/17/2010)   Lipid self-care plan printed.  Process Orders Check Orders Results:     [facility]: [id number] not required for this insurance Tests Sent for requisitioning (March 18, 2010 4:32 PM):     03/17/2010: [facility] -- T-Basic Metabolic Panel 0000000 (signed)     04/04/2010: [facility] -- T-Basic Metabolic Panel 0000000 (signed)     04/04/2010: [facility] -- T-CMP with Estimated GFR [id number] (signed)

## 2010-07-06 NOTE — Consult Note (Addendum)
Summary: [facility]   Imported By: Garlan Fillers 06/09/2010 13:55:06  _____________________________________________________________________  External Attachment:    Type:   Image     Comment:   External Document  Appended Document: [facility]   Diabetic Eye Exam  Procedure date:  05/25/2010  Findings:      Severe Proliferative diabetic retinopathy.   Glaucoma suspect    Procedures Next Due Date:    Diabetic Eye Exam: 09/2010   Diabetic Eye Exam  Procedure date:  05/25/2010  Findings:      Severe Proliferative diabetic retinopathy.   Glaucoma suspect    Procedures Next Due Date:    Diabetic Eye Exam: 09/2010

## 2010-07-06 NOTE — Progress Notes (Signed)
  Patient was called for results of blood work and to inform him that he needs to stop lisinopril and lasix. Creatinine increase from 1.24 to 2.36 in 16 days after increasing Lisinopril from 5 mg to 20 mg.  He was asked to come in tomorrow at 2 pm for blood work and office visit.

## 2010-07-06 NOTE — Miscellaneous (Signed)
Summary: Medication  Clinical Lists Changes  Medications: Added new medication of PRAVASTATIN SODIUM 40 MG TABS (PRAVASTATIN SODIUM) Take one tablet by mouth once a day in the evening. - Signed Rx of PRAVASTATIN SODIUM 40 MG TABS (PRAVASTATIN SODIUM) Take one tablet by mouth once a day in the evening.;  #30 x 6;  Signed;  Entered by: Rosalia Hammers MD;  Authorized by: Rosalia Hammers MD;  Method used: Electronically to [facility] [address], [place], [place], [place]  [address], Ph: [phone], Fax: [phone] Orders: Added new Test order of T-Basic Metabolic Panel ([id number]) - Signed     Prescriptions: PRAVASTATIN SODIUM 40 MG TABS (PRAVASTATIN SODIUM) Take one tablet by mouth once a day in the evening.  #30 x 6   Entered and Authorized by:   Rosalia Hammers MD   Signed by:   Rosalia Hammers MD on 02/24/2010   Method used:   Electronically to        [facility] (retail)       [address]       [place], [place]  [address]       Ph: [phone]       Fax: [phone]   RxID:   [id number]  Call to pt to inform him of the need to start medication for his elevated Cholesterol.  Message left for pt to call the Clinics. Sander Nephew RN  February 24, 2010 10:07 AM

## 2010-07-06 NOTE — Assessment & Plan Note (Signed)
Summary: FU/SB.   Vital Signs:  Patient profile:   55 year old male Height:      71 inches Weight:      286.5 pounds BMI:     40.10 Temp:     98.2 degrees F oral Pulse rate:   90 / minute BP sitting:   150 / 90  (right arm)  Vitals Entered By: Silverio Decamp NT II (May 19, 2010 10:09 AM) Is Patient Diabetic? Yes Did you bring your meter with you today? Yes Pain Assessment Patient in pain? no      Nutritional Status BMI of > 30 = obese  Have you ever been in a relationship where you felt threatened, hurt or afraid?No   Does patient need assistance? Functional Status Self care Ambulation Normal   Primary Care Odeth Bry:  Rosalia Hammers MD   History of Present Illness: Patient is a 55 year old man who presents today for follow up from his last clinic appointment. He'd suffered acute renal failure in late October when his Lisinopril was increased from 5mg  to 20mg , Lasix and Lisinopril were stopped, his cardiologist subsequently restarted him on Lasix given poorly controlled HTN, and then Lisinopril was started back 2 weeks ago at 5mg  after his Cr returned back to normal. Today, blood pressure remains poorly controlled, however, Cr is normal today at 1.28.     Preventive Screening-Counseling & Management  Alcohol-Tobacco     Alcohol type: very seldom     Smoking Status: never  Caffeine-Diet-Exercise     Caffeine use/day: tea     Does Patient Exercise: no     Type of exercise: walks on the job  Current Problems (verified): 1)  Fatigue  (ICD-780.79) 2)  Renal Failure, Acute  (ICD-584.9) 3)  Obesity  (ICD-278.00) 4)  Paroxysmal Atrial Tachycardia  (ICD-427.0) 5)  Cardiomyopathy  (ICD-425.4) 6)  Degenerative Joint Disease, Advanced  (ICD-715.90) 7)  Ankle Pain, Right  (ICD-719.47) 8)  Joint Effusion, Left Knee  (ICD-719.06) 9)  Special Screening For Malignant Neoplasms Colon  (ICD-V76.51) 10)  Diabetic Retinopathy  (ICD-250.50) 11)  Nephropathy, Diabetic   (ICD-250.40) 12)  Hyperlipidemia  (ICD-272.4) 66)  Gerd  (ICD-530.81) 14)  Hypertension  (ICD-401.9) 15)  Diabetes Mellitus, Type II  (ICD-250.00)  Current Medications (verified): 1)  Aspirin 81 Mg Tbec (Aspirin) .... Take 1 Tablet By Mouth Once A Day 2)  Carvedilol 25 Mg  Tabs (Carvedilol) .... Take 1 Tablet By Mouth Two Times A Day 3)  Cialis 10 Mg  Tabs (Tadalafil) .... Take 1 Tablet By Mouth Once A Day As Needed For Sexual Activity 4)  Bd Insulin Syringe 29g X 1/2" 1 Ml  Misc (Insulin Syringe-Needle U-100) .... Use As Directed 5)  Onetouch Ultra Test  Strp (Glucose Blood) .... Check Blood Sugars Before Each Meal 6)  Onetouch Ultrasoft Lancets  Misc (Lancets) .... Use To Test Your Blood Sugar Before Each Meal Each Day 7)  Metformin Hcl 1000 Mg Tabs (Metformin Hcl) .... Take 1 Tablet By Mouth Two Times A Day. 8)  Tramadol Hcl 50 Mg Tabs (Tramadol Hcl) .... Take 1 Pill By Mouth Three Times A Day As Needed For Pain. 9)  Onetouch Ultra Mini W/device Kit (Blood Glucose Monitoring Suppl) .... Use To Check Blood Sugar Durng Day At Work Before and After Lunch 10)  Fish Oil 1200 Mg Caps (Omega-3 Fatty Acids) .... Taking Two Tablet Once A Day 11)  Osteo Bi-Flex Regular Strength 250-200 Mg Tabs (Glucosamine-Chondroitin) .... Taking 4 Caplets  Per Day With Food. 12)  Lantus 100 Unit/ml Soln (Insulin Glargine) .... Take 110 Units Once A Day in The Morning.  You Can Split 55 Units and 55 Units and Inject It At Two Locations. 13)  Pravastatin Sodium 40 Mg Tabs (Pravastatin Sodium) .... Take One Tablet By Mouth Once A Day in The Evening. 14)  Furosemide 80 Mg Tabs (Furosemide) .... Once Daily 15)  Lisinopril 5 Mg Tabs (Lisinopril) .... Take 2 Tablets By Mouth Once A Day For Blood Pressure  Allergies (verified): No Known Drug Allergies  Past History:  Past Medical History: Last updated: 02/22/2010 Diabetes mellitus, type II:  proteinuria     eye exam:  Dr. Elonda Husky Sept 18     Proteinuria:  micro/al:   113 Jul 06 Hypertension GERD Hyperlipidemia Paroxysmal atrial tachycardia:  Awaiting results of ambulatory heart monitor from Dr. Caryl Comes (also wearing life vest):   Cardiomyopathy, Nonischemic, tachycardia related:  Cath Dec 2007-normal; EF-20-25% ED  Past Surgical History: Last updated: 02/17/2008 Unremarkable  Family History: Last updated: 02/17/2008 Family History of Diabetes: Mother & Brother  Social History: Last updated: 02/17/2008 Occupation: Production assistant, radio Patient has never smoked.  Alcohol Use - no Illicit Drug Use - no  Risk Factors: Caffeine Use: tea (05/19/2010) Exercise: no (05/19/2010)  Risk Factors: Smoking Status: never (05/19/2010)  Review of Systems  The patient denies fever, chest pain, dyspnea on exertion, headaches, abdominal pain, melena, hematochezia, unusual weight change, and abnormal bleeding.    Physical Exam  General:  alert, well-developed, and well-hydrated.   Head:  normocephalic and atraumatic.   Lungs:  normal respiratory effort and normal breath sounds.   Heart:  normal rate, regular rhythm, no murmur, and no gallop.   Abdomen:  obese, non-tender.   Pulses:  normal peripheral pulses Extremities:  no edema or cyanosis Neurologic:  alert & oriented X3.     Impression & Recommendations:  Problem # 1:  HYPERTENSION (ICD-401.9) BP still not well controlled. On max dose of BB. Given normal Cr at 1.28 today, will increase Lisinopril slowly to 10mg  and have him come back in 2 weeks for another blood pressure check and bmet.  His updated medication list for this problem includes:    Carvedilol 25 Mg Tabs (Carvedilol) .Marland Kitchen... Take 1 tablet by mouth two times a day    Furosemide 80 Mg Tabs (Furosemide) ..... Once daily    Lisinopril 5 Mg Tabs (Lisinopril) .Marland Kitchen... Take 2 tablets by mouth once a day for blood pressure  BP today: 150/90 Prior BP: 147/77 (05/05/2010)  Labs Reviewed: K+: 4.2 (05/05/2010) Creat: : 1.44 (05/05/2010)   Chol:  341 (02/22/2010)   HDL: 45 (02/22/2010)   LDL: 257 (02/22/2010)   TG: 195 (02/22/2010)  Problem # 2:  RENAL FAILURE, ACUTE (ICD-584.9) Resolved.  Orders: T-Basic Metabolic Panel (99991111)  Complete Medication List: 1)  Aspirin 81 Mg Tbec (Aspirin) .... Take 1 tablet by mouth once a day 2)  Carvedilol 25 Mg Tabs (Carvedilol) .... Take 1 tablet by mouth two times a day 3)  Cialis 10 Mg Tabs (Tadalafil) .... Take 1 tablet by mouth once a day as needed for sexual activity 4)  Bd Insulin Syringe 29g X 1/2" 1 Ml Misc (Insulin syringe-needle u-100) .... Use as directed 5)  Onetouch Ultra Test Strp (Glucose blood) .... Check blood sugars before each meal 6)  Onetouch Ultrasoft Lancets Misc (Lancets) .... Use to test your blood sugar before each meal each day 7)  Metformin Hcl 1000 Mg Tabs (  Metformin hcl) .... Take 1 tablet by mouth two times a day. 8)  Tramadol Hcl 50 Mg Tabs (Tramadol hcl) .... Take 1 pill by mouth three times a day as needed for pain. 9)  Onetouch Ultra Mini W/device Kit (Blood glucose monitoring suppl) .... Use to check blood sugar durng day at work before and after lunch 10)  Fish Oil 1200 Mg Caps (Omega-3 fatty acids) .... Taking two tablet once a day 11)  Osteo Bi-flex Regular Strength 250-200 Mg Tabs (Glucosamine-chondroitin) .... Taking 4 caplets per day with food. 12)  Lantus 100 Unit/ml Soln (Insulin glargine) .... Take 110 units once a day in the morning.  you can split 55 units and 55 units and inject it at two locations. 13)  Pravastatin Sodium 40 Mg Tabs (Pravastatin sodium) .... Take one tablet by mouth once a day in the evening. 14)  Furosemide 80 Mg Tabs (Furosemide) .... Once daily 15)  Lisinopril 5 Mg Tabs (Lisinopril) .... Take 2 tablets by mouth once a day for blood pressure  Patient Instructions: 1)  Take 2 tablets of your Lisinopril (total 10mg ) for your blood pressure. 2)  Please make an appointment with Dr. Stanford Scotland for December 27 for blood pressure  check and lab work.   Orders Added: 1)  T-Basic Metabolic Panel 0000000 2)  Est. Patient Level II [id number]    Prevention & Chronic Care Immunizations   Influenza vaccine: Historical  (03/20/2010)   Influenza vaccine deferral: Deferred  (02/22/2010)    Tetanus booster: Not documented    Pneumococcal vaccine: Not documented   Pneumococcal vaccine deferral: Not indicated  (02/20/2010)  Colorectal Screening   Hemoccult: Negative  (12/29/2004)    Colonoscopy: Location:  [place].    (02/26/2008)   Colonoscopy due: 03/2013  Other Screening   PSA: Not documented   PSA action/deferral: Discussed-decision deferred  (05/05/2010)   Smoking status: never  (05/19/2010)  Diabetes Mellitus   HgbA1C: 9.2  (05/05/2010)   HgbA1C action/deferral: Deferred  (01/24/2009)    Eye exam: Proliferative diabetic retinopathy.   Severe OU- referral to Reintal specialist Exam  by Monna Fam   (09/01/2008)   Diabetic eye exam action/deferral: Deferred  (01/24/2009)   Eye exam due: 12/2008    Foot exam: yes  (05/05/2010)   Foot exam action/deferral: Do today   High risk foot: Yes  (05/05/2010)   Foot care education: Done  (05/05/2010)   Foot exam due: 08/24/2010    Urine microalbumin/creatinine ratio: 1192.6  (02/22/2010)   Urine microalbumin action/deferral: Ordered  Lipids   Total Cholesterol: 341  (02/22/2010)   Lipid panel action/deferral: Deferred   LDL: 257  (02/22/2010)   LDL Direct: 247.6  (06/18/2007)   HDL: 45  (02/22/2010)   Triglycerides: 195  (02/22/2010)    SGOT (AST): 12  (04/04/2010)   BMP action: Deferred   SGPT (ALT): 14  (04/04/2010)   Alkaline phosphatase: 91  (04/04/2010)   Total bilirubin: 0.3  (04/04/2010)  Hypertension   Last Blood Pressure: 150 / 90  (05/19/2010)   Serum creatinine: 1.44  (05/05/2010)   BMP action: Deferred   Serum potassium 4.2  (05/05/2010)    Hypertension flowsheet reviewed?: Yes   Progress toward BP goal:  Deteriorated  Self-Management Support :   Personal Goals (by the next clinic visit) :     Personal A1C goal: 7  (03/15/2009)     Personal blood pressure goal: 140/90  (03/15/2009)     Personal LDL goal: 70  (03/15/2009)  Patient will work on the following items until the next clinic visit to reach self-care goals:     Medications and monitoring: take my medicines every day, check my blood sugar, bring all of my medications to every visit  (05/19/2010)     Eating: eat more vegetables, use fresh or frozen vegetables, eat foods that are low in salt, eat baked foods instead of fried foods, eat fruit for snacks and desserts  (05/19/2010)     Activity: take a 30 minute walk every day  (05/19/2010)     Other: walks a lot "on the job" - wants to start using stationary bike  (01/24/2009)    Diabetes self-management support: Written self-care plan, Resources for patients handout  (05/19/2010)   Diabetes care plan printed   Last diabetes self-management training by diabetes educator: 03/23/2009   Last medical nutrition therapy: 05/13/2008    Hypertension self-management support: Education handout, Resources for patients handout  (05/19/2010)   Hypertension education handout printed    Lipid self-management support: Education handout, Resources for patients handout  (05/19/2010)     Lipid education handout printed      Resource handout printed.   Process Orders Check Orders Results:     [facility]: ABN not required for this insurance Tests Sent for requisitioning (May 19, 2010 4:44 PM):     05/19/2010: [facility] -- T-Basic Metabolic Panel 0000000 (signed)     Laboratory Results  Date/Time Received: May 19, 2010 10:42 AM Date/Time Reported: Maryan Rued  May 19, 2010 10:42 AM   Stool - Occult Blood Hemmoccult #1: negative Date: 05/17/2010 Hemoccult #2: negative Date: 05/18/2010 Hemoccult #3: negative Date:  05/19/2010  Kit Test Internal QC: Positive   (Normal Range: Negative)

## 2010-07-06 NOTE — Progress Notes (Signed)
Summary: Refill/gh  Phone Note Refill Request Message from:  Fax from Pharmacy on November 29, 2009 2:19 PM  Refills Requested: Medication #1:  BD INSULIN SYRINGE 29G X 1/2" 1 ML  MISC use as directed   Last Refilled: 08/14/2009 Last office visit was 03/15/2009.  Last labs were 12/29/2008   Method Requested: Electronic Initial call taken by: Sander Nephew RN,  November 29, 2009 2:19 PM  Follow-up for Phone Call       Follow-up by: Sander Nephew RN,  December 01, 2009 12:19 PM    Prescriptions: BD INSULIN SYRINGE 29G X 1/2" 1 ML  MISC (INSULIN SYRINGE-NEEDLE U-100) use as directed  #100 x 11   Entered and Authorized by:   Burman Freestone MD   Signed by:   Burman Freestone MD on 11/29/2009   Method used:   Electronically to        [facility] (retail)       [address]       [place], [place]  [address]       Ph: [phone]       Fax: [phone]   RxID:   [id number]

## 2010-07-06 NOTE — Assessment & Plan Note (Addendum)
Summary: ACUTE/ILLATH/2 WEEK RECHECK/CH   Vital Signs:  Patient profile:   55 year old male Height:      71 inches (180.34 cm) Weight:      288.8 pounds (131.27 kg) BMI:     40.43 Temp:     97.6 degrees F (36.44 degrees C) oral Pulse rate:   67 / minute BP sitting:   147 / 77  (right arm)  Vitals Entered By: Hilda Blades Ditzler RN (May 05, 2010 11:28 AM) Is Patient Diabetic? Yes Did you bring your meter with you today? No Pain Assessment Patient in pain? yes     Location: knees Intensity: 5 Type: aching Onset of pain  long time Nutritional Status BMI of > 30 = obese Nutritional Status Detail appetite good CBG Result 195  Have you ever been in a relationship where you felt threatened, hurt or afraid?denies   Does patient need assistance? Functional Status Self care Ambulation Impaired:Risk for fall Comments Uses a cane. FU - sch for left eye surgery 05/08/10.   Primary Care Domingos Riggi:  Rosalia Hammers MD   History of Present Illness: Patient is a 55 year old man who presents today for follow up from his last clinic appointment.  In the end of October his lisinopril was increased from 5 mg to 20 mg and on follow up lab testing his creatinine had bumped from 1.2 to 2.4.  He was stopped on his lasix and lisinopril.  His creatinine has returned to normal. His cardiologist restarted his lasix now and he is here to follow up on his kidney function before restarting lisinopril.  He is otherwise doing well.    Depression History:      The patient denies a depressed mood most of the day and a diminished interest in his usual daily activities.        The patient denies that he feels like life is not worth living, denies that he wishes that he were dead, and denies that he has thought about ending his life.         Preventive Screening-Counseling & Management  Alcohol-Tobacco     Alcohol type: very seldom     Smoking Status: never  Caffeine-Diet-Exercise     Caffeine use/day:  tea     Does Patient Exercise: no     Type of exercise: walks on the job  Current Medications (verified): 1)  Aspirin 81 Mg Tbec (Aspirin) .... Take 1 Tablet By Mouth Once A Day 2)  Carvedilol 25 Mg  Tabs (Carvedilol) .... Take 1 Tablet By Mouth Two Times A Day 3)  Cialis 10 Mg  Tabs (Tadalafil) .... Take 1 Tablet By Mouth Once A Day As Needed For Sexual Activity 4)  Bd Insulin Syringe 29g X 1/2" 1 Ml  Misc (Insulin Syringe-Needle U-100) .... Use As Directed 5)  Onetouch Ultra Test  Strp (Glucose Blood) .... Check Blood Sugars Before Each Meal 6)  Onetouch Ultrasoft Lancets  Misc (Lancets) .... Use To Test Your Blood Sugar Before Each Meal Each Day 7)  Metformin Hcl 1000 Mg Tabs (Metformin Hcl) .... Take 1 Tablet By Mouth Two Times A Day. 8)  Tramadol Hcl 50 Mg Tabs (Tramadol Hcl) .... Take 1 Pill By Mouth Three Times A Day As Needed For Pain. 9)  Onetouch Ultra Mini W/device Kit (Blood Glucose Monitoring Suppl) .... Use To Check Blood Sugar Durng Day At Work Before and After Lunch 10)  Fish Oil 1200 Mg Caps (Omega-3 Fatty Acids) .... Taking  Two Tablet Once A Day 11)  Osteo Bi-Flex Regular Strength 250-200 Mg Tabs (Glucosamine-Chondroitin) .... Taking 4 Caplets Per Day With Food. 12)  Lantus 100 Unit/ml Soln (Insulin Glargine) .... Take 110 Units Once A Day in The Morning.  You Can Split 55 Units and 55 Units and Inject It At Two Locations. 13)  Pravastatin Sodium 40 Mg Tabs (Pravastatin Sodium) .... Take One Tablet By Mouth Once A Day in The Evening. 14)  Furosemide 80 Mg Tabs (Furosemide) .... Once Daily  Allergies (verified): No Known Drug Allergies  Past History:  Past medical, surgical, family and social histories (including risk factors) reviewed, and no changes noted (except as noted below).  Past Medical History: Reviewed history from 02/22/2010 and no changes required. Diabetes mellitus, type II:  proteinuria     eye exam:  Dr. Elonda Husky Sept 18     Proteinuria:  micro/al:  113  Jul 06 Hypertension GERD Hyperlipidemia Paroxysmal atrial tachycardia:  Awaiting results of ambulatory heart monitor from Dr. Caryl Comes (also wearing life vest):   Cardiomyopathy, Nonischemic, tachycardia related:  Cath Dec 2007-normal; EF-20-25% ED  Past Surgical History: Reviewed history from 02/17/2008 and no changes required. Unremarkable  Family History: Reviewed history from 02/17/2008 and no changes required. Family History of Diabetes: Mother & Brother  Social History: Reviewed history from 02/17/2008 and no changes required. 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  Alcohol Use - no Illicit Drug Use - no  Review of Systems  The patient denies anorexia, fever, weight loss, weight gain, vision loss, decreased hearing, hoarseness, chest pain, syncope, dyspnea on exertion, peripheral edema, prolonged cough, headaches, hemoptysis, abdominal pain, melena, hematochezia, severe indigestion/heartburn, hematuria, incontinence, genital sores, muscle weakness, suspicious skin lesions, transient blindness, difficulty walking, depression, unusual weight change, abnormal bleeding, enlarged lymph nodes, angioedema, and testicular masses.    Physical Exam  Additional Exam:  General: Well developed, male in no acute distress.  Vital signs reviewed.  Head: Atraumatic, normocephalic with no signs of trauma  Eyes: PERRLA, EOM intact  Ears: TM intact  Nose: Nares patent, mucosa is pink and moist, no polyps noted  Mouth: Mucosa is pink and moist, good dention,   Neck: Supple, full ROM, no thyromegaly or masses noted  Resp: Clear to ascultation bilaterally, no wheezes, rales, or rhonchi noted  CV: Regular rate and rhythm with no murmurs, rubs, or gallops noted  Abdomen: Soft, non-tender, non-distended with normal bowel sounds  Musculoskelatal: ROM full with intact strength, no pain to palpation  Neurologic: Alert and oriented x3, Cranial nerves II-XII grossly intact, DTR normal  and symmetric  Pulses: Radial, brachial, carotid, femoral, dorsal pedis, and posterior tibial pulses equal and symmetric    Diabetes Management Exam:    Foot Exam (with socks and/or shoes not present):       Sensory-Monofilament:          Left foot: normal          Right foot: normal   Impression & Recommendations:  Problem # 1:  RENAL FAILURE, ACUTE (ICD-584.9) Creatinine today is 1.44 which is stable from the last reading.  We will go ahead and restart his lisinopril at 5 mg daily and have him follow up in 2 weeks time to check his blood pressure and recheck his kidney function.    Orders: T-Basic Metabolic Panel (99991111)  Labs Reviewed: BUN: 34 (04/21/2010)   Cr: 1.46 (04/21/2010)    Hgb: 14.3 (02/22/2010)   Hct: 43.8 (02/22/2010)   Ca++: 9.4 (04/21/2010)  TP: 6.2 (04/04/2010)   Alb: 3.7 (04/04/2010)  Problem # 2:  HYPERTENSION (ICD-401.9) Blood pressure is a bit high today but he has been without his ACEI for a few week.  He will likely need the ACEI at low dose to get him to his goal of 130/80.    His updated medication list for this problem includes:    Carvedilol 25 Mg Tabs (Carvedilol) .Marland Kitchen... Take 1 tablet by mouth two times a day    Furosemide 80 Mg Tabs (Furosemide) ..... Once daily  Orders: T-Basic Metabolic Panel (99991111)  BP today: 147/77 Prior BP: 135/84 (04/21/2010)  Labs Reviewed: K+: 4.2 (04/21/2010) Creat: : 1.46 (04/21/2010)   Chol: 341 (02/22/2010)   HDL: 45 (02/22/2010)   LDL: 257 (02/22/2010)   TG: 195 (02/22/2010)  Problem # 3:  DIABETES MELLITUS, TYPE II (ICD-250.00) His A1C is a bit elevated today from his last time.  He states that he has been taking his medications but during medication reconciliation he was only taking 50 units two times a day so he will increase to the 55 units two times a day and monitor his blood sugars more closely, alternating before breakfast, before lunch, before supper,and before bed.  When he comes back for  follow up he will bring his meter with him so we can review and see if he needs insulin mal coverage in addition to his basal insulin regimen. His updated medication list for this problem includes:    Aspirin 81 Mg Tbec (Aspirin) .Marland Kitchen... Take 1 tablet by mouth once a day    Metformin Hcl 1000 Mg Tabs (Metformin hcl) .Marland Kitchen... Take 1 tablet by mouth two times a day.    Lantus 100 Unit/ml Soln (Insulin glargine) .Marland Kitchen... Take 110 units once a day in the morning.  you can split 55 units and 55 units and inject it at two locations.  Orders: T- Capillary Blood Glucose ([id number]) T-Hgb A1C (in-house) [id number])  Labs Reviewed: Creat: 1.46 (04/21/2010)     Last Eye Exam: Proliferative diabetic retinopathy.   Severe OU- referral to Reintal specialist Exam  by Monna Fam  (09/01/2008) Reviewed HgBA1c results: 9.2 (05/05/2010)  8.6 (02/20/2010)  Complete Medication List: 1)  Aspirin 81 Mg Tbec (Aspirin) .... Take 1 tablet by mouth once a day 2)  Carvedilol 25 Mg Tabs (Carvedilol) .... Take 1 tablet by mouth two times a day 3)  Cialis 10 Mg Tabs (Tadalafil) .... Take 1 tablet by mouth once a day as needed for sexual activity 4)  Bd Insulin Syringe 29g X 1/2" 1 Ml Misc (Insulin syringe-needle u-100) .... Use as directed 5)  Onetouch Ultra Test Strp (Glucose blood) .... Check blood sugars before each meal 6)  Onetouch Ultrasoft Lancets Misc (Lancets) .... Use to test your blood sugar before each meal each day 7)  Metformin Hcl 1000 Mg Tabs (Metformin hcl) .... Take 1 tablet by mouth two times a day. 8)  Tramadol Hcl 50 Mg Tabs (Tramadol hcl) .... Take 1 pill by mouth three times a day as needed for pain. 9)  Onetouch Ultra Mini W/device Kit (Blood glucose monitoring suppl) .... Use to check blood sugar durng day at work before and after lunch 10)  Fish Oil 1200 Mg Caps (Omega-3 fatty acids) .... Taking two tablet once a day 11)  Osteo Bi-flex Regular Strength 250-200 Mg Tabs (Glucosamine-chondroitin) ....  Taking 4 caplets per day with food. 12)  Lantus 100 Unit/ml Soln (Insulin glargine) .... Take 110 units once a day  in the morning.  you can split 55 units and 55 units and inject it at two locations. 13)  Pravastatin Sodium 40 Mg Tabs (Pravastatin sodium) .... Take one tablet by mouth once a day in the evening. 14)  Furosemide 80 Mg Tabs (Furosemide) .... Once daily  Other Orders: T-Hemoccult Card-Multiple (take home) ([id number])  Patient Instructions: 1)  Stop in the lab for a test of your kidney function.  If the kidney function is good we will call and start the blood pressure medicine. 2)  Check your blood sugar 1-2 times daily alternating between before breakfast, before lunch, before supper, and before bedtime. 3)  Make sure you bring your meter to your next appointment. 4)  Follow up in 2 weeks to check your kidney function and to review your blood sugar readings. Prescriptions: LANTUS 100 UNIT/ML SOLN (INSULIN GLARGINE) Take 110 units once a day in the morning.  You can split 55 units and 55 units and inject it at two locations.  #5 pens x 11   Entered and Authorized by:   Trish Fountain MD   Signed by:   Trish Fountain MD on 05/05/2010   Method used:   Electronically to        [facility] (retail)       [address]       [place], [place]  [address]       Ph: [phone]       Fax: [phone]   RxID:   [id number] METFORMIN HCL 1000 MG TABS (METFORMIN HCL) take 1 tablet by mouth two times a day.  #60 x 4   Entered and Authorized by:   Trish Fountain MD   Signed by:   Trish Fountain MD on 05/05/2010   Method used:   Electronically to        [facility] (retail)       [address]       [place], [place]  [address]       Ph: [phone]       Fax: [phone]   RxID:   [id number]    Orders Added: 1)  T- Capillary Blood Glucose [[id number]] 2)  T-Hgb A1C (in-house) [[id number]] 3)   T-Hemoccult Card-Multiple (take home) [[id number]] 4)  T-Basic Metabolic Panel 0000000 5)  Est. Patient Level III [id number]   Process Orders Check Orders Results:     [facility]: ABN not required for this insurance Tests Sent for requisitioning (May 08, 2010 7:16 PM):     05/05/2010: [facility] -- T-Basic Metabolic Panel 0000000 (signed)     Prevention & Chronic Care Immunizations   Influenza vaccine: Historical  (03/20/2010)   Influenza vaccine deferral: Deferred  (02/22/2010)    Tetanus booster: Not documented    Pneumococcal vaccine: Not documented   Pneumococcal vaccine deferral: Not indicated  (02/20/2010)  Colorectal Screening   Hemoccult: Negative  (12/29/2004)    Colonoscopy: Location:  [place].    (02/26/2008)   Colonoscopy due: 03/2013  Other Screening   PSA: Not documented   PSA action/deferral: Discussed-decision deferred  (05/05/2010)   Smoking status: never  (05/05/2010)  Diabetes Mellitus   HgbA1C: 9.2  (05/05/2010)   HgbA1C action/deferral: Deferred  (01/24/2009)    Eye exam: Proliferative diabetic retinopathy.   Severe OU- referral to Reintal specialist Exam  by Monna Fam   (09/01/2008)  Diabetic eye exam action/deferral: Deferred  (01/24/2009)   Eye exam due: 12/2008    Foot exam: yes  (05/05/2010)   Foot exam action/deferral: Do today   High risk foot: Yes  (05/05/2010)   Foot care education: Done  (05/05/2010)   Foot exam due: 08/24/2010    Urine microalbumin/creatinine ratio: 1192.6  (02/22/2010)   Urine microalbumin action/deferral: Ordered    Diabetes flowsheet reviewed?: Yes   Progress toward A1C goal: Deteriorated  Lipids   Total Cholesterol: 341  (02/22/2010)   Lipid panel action/deferral: Deferred   LDL: 257  (02/22/2010)   LDL Direct: 247.6  (06/18/2007)   HDL: 45  (02/22/2010)   Triglycerides: 195  (02/22/2010)    SGOT (AST): 12  (04/04/2010)   BMP  action: Deferred   SGPT (ALT): 14  (04/04/2010)   Alkaline phosphatase: 91  (04/04/2010)   Total bilirubin: 0.3  (04/04/2010)    Lipid flowsheet reviewed?: Yes   Progress toward LDL goal: Unchanged  Hypertension   Last Blood Pressure: 147 / 77  (05/05/2010)   Serum creatinine: 1.46  (04/21/2010)   BMP action: Deferred   Serum potassium 4.2  (04/21/2010)    Hypertension flowsheet reviewed?: Yes   Progress toward BP goal: Unchanged  Self-Management Support :   Personal Goals (by the next clinic visit) :     Personal A1C goal: 7  (03/15/2009)     Personal blood pressure goal: 140/90  (03/15/2009)     Personal LDL goal: 70  (03/15/2009)    Patient will work on the following items until the next clinic visit to reach self-care goals:     Medications and monitoring: take my medicines every day, check my blood sugar, bring all of my medications to every visit, weigh myself weekly, examine my feet every day  (05/05/2010)     Eating: drink diet soda or water instead of juice or soda, eat more vegetables, use fresh or frozen vegetables, eat foods that are low in salt, eat baked foods instead of fried foods, eat fruit for snacks and desserts, limit or avoid alcohol  (05/05/2010)     Activity: take a 30 minute walk every day  (05/05/2010)     Other: walks a lot "on the job" - wants to start using stationary bike  (01/24/2009)    Diabetes self-management support: Written self-care plan, Education handout, Resources for patients handout  (05/05/2010)   Diabetes care plan printed   Diabetes education handout printed   Last diabetes self-management training by diabetes educator: 03/23/2009   Last medical nutrition therapy: 05/13/2008    Hypertension self-management support: Written self-care plan, Education handout, Resources for patients handout  (05/05/2010)   Hypertension self-care plan printed.   Hypertension education handout printed    Lipid self-management support: Written self-care  plan, Education handout, Resources for patients handout  (05/05/2010)   Lipid self-care plan printed.   Lipid education handout printed      Resource handout printed.   Last LDL:                                                 257 (02/22/2010 7:38:00 PM)        Diabetic Foot Exam Foot Inspection Is there a history of a foot ulcer?              No Is there a  foot ulcer now?              No Can the patient see the bottom of their feet?          Yes Are the shoes appropriate in style and fit?          Yes Is there swelling or an abnormal foot shape?          No Are the toenails long?                Yes Are the toenails thick?                Yes Are the toenails ingrown?              No Is there heavy callous build-up?              No Is there a claw toe deformity?                          No Is there elevated skin temperature?            No Is there limited ankle dorsiflexion?            No Is there foot or ankle muscle weakness?            No Do you have pain in calf while walking?           No      Diabetic Foot Care Education :Patient educated on appropriate care of diabetic feet.  Pulse Check          Right Foot          Left Foot Posterior Tibial:        1+            1+ Dorsalis Pedis:        1+            1+  High Risk Feet? Yes   10-g (5.07) Semmes-Weinstein Monofilament Test Performed by: Hilda Blades Ditzler RN          Right Foot          Left Foot Visual Inspection     normal         normal Test Control      normal         normal Site 1         normal         normal Site 2         normal         normal Site 3         normal         normal Site 4         normal         normal Site 5         normal         normal Site 6         normal         normal Site 7         normal         normal Site 8         normal         normal Site 9         normal         normal Site 10  normal         normal  Impression      normal         normal   Laboratory Results     Blood Tests   Date/Time Received: May 05, 2010 11:47 AM  Date/Time Reported: Lenoria Farrier  May 05, 2010 11:47 AM   HGBA1C: 9.2%   (Normal Range: Non-Diabetic - 3-6%   Control Diabetic - 6-8%) CBG Random:: 195mg /dL     Appended Document: ACUTE/ILLATH/2 WEEK RECHECK/CH Mr. Koeppe history and physical examination were reviewed with Dr. Obie Dredge and the assessment and plan were formulated together.  His hyperlipidemia is uncontrolled on the pravastatin therapy and he requires a more potent statin if he hopes to reach the target LDL.  He was on simvastatin 10 mg by mouth at bedtime in 2009 and it was stopped for unclear reasons.  Notes at that time fail to mention any reasons.  This needs to be explored with Mr. Calliste at the follow-up appointment.  If he did not have a side effect with the simvastatin he should be on 40 mg by mouth at bedtime with a reassessment in his LDL 4-6 weeks later to assure he is at target.

## 2010-07-06 NOTE — Letter (Signed)
Summary: ONE TOUCH   ONE TOUCH   Imported By: Garlan Fillers 05/01/2010 11:05:25  _____________________________________________________________________  External Attachment:    Type:   Image     Comment:   External Document

## 2010-07-06 NOTE — Assessment & Plan Note (Signed)
Summary: REASSIGNED NEW TO DR/CFB   Vital Signs:  Patient profile:   55 year old male Height:      71 inches (180.34 cm) Weight:      290.2 pounds (131.91 kg) BMI:     40.62 Temp:     97.9 degrees F oral Pulse rate:   76 / minute BP sitting:   150 / 86  (right arm) Cuff size:   large  Vitals Entered By: Morrison Old RN (February 20, 2010 10:41 AM) CC: Check-up. Meet new MD.  Medication refills. Is Patient Diabetic? Yes Did you bring your meter with you today? Yes Pain Assessment Patient in pain? no      Nutritional Status BMI of > 30 = obese CBG Result 347  Have you ever been in a relationship where you felt threatened, hurt or afraid?No   Does patient need assistance? Functional Status Self care Ambulation Normal   Primary Care Provider:  Rosalia Hammers MD  CC:  Check-up. Meet new MD.  Medication refills..  History of Present Illness: This is 55 year old male with PMH including DM , GERD, HTN, HLD who presents for a regular check up.  1. DM : CBG 347, HgbA1c 9.6 (03/2009), 8.6 today currently should be on Humalin R 30 am and 20 pm, Humalin N  70 am and  Metformin 1000 mg two times a day . Metformin stopped a couple of month ago because no more prescription. A week ago was running low on Insulin and adjusted accordingly. Since today does not have any R left.   2. HTN: BP 150/86, currently should be on Carvedilol 25 mg, Norvasc 10 mg , Lasix 80 mg but not sure what he is taking  3. HLD currently  should be on Crestor 40 mg, Lipid panel 03/2009: Chol 162, Trigl 131, LDL 90, HDL 46 but  not taking since  no more prescription  4. GERD: omeprazole 40 mg but currently not taking.   5. Paroxysmal atrial tachycardia. Followed by Dr Jens Som  6. Cardiomyopathy, Nonischemic, tachycardia related:  Cath Dec 2007-normal; EF-20-25%  7. 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         Preventive Screening-Counseling & Management  Alcohol-Tobacco     Alcohol type: very seldom     Smoking Status: never  Caffeine-Diet-Exercise     Caffeine use/day: tea     Does Patient Exercise: no     Type of exercise: walks on the job  Problems Prior to Update: 1)  Obesity  (ICD-278.00) 2)  Paroxysmal Atrial Tachycardia  (ICD-427.0) 3)  Cardiomyopathy  (ICD-425.4) 4)  Degenerative Joint Disease, Advanced  (ICD-715.90) 5)  Ankle Pain, Right  (ICD-719.47) 6)  Cellulitis and Abscess of Face  (ICD-682.0) 7)  Joint Effusion, Left Knee  (ICD-719.06) 8)  Special Screening For Malignant Neoplasms Colon  (ICD-V76.51) 9)  Diabetic Retinopathy  (ICD-250.50) 10)  Nephropathy, Diabetic  (ICD-250.40) 11)  Hyperlipidemia  (ICD-272.4) 12)  Gerd  (ICD-530.81) 13)  Hypertension  (ICD-401.9) 14)  Diabetes Mellitus, Type II  (ICD-250.00)  Allergies: No Known Drug Allergies  Past History:  Past Medical History: Last updated: 02/06/2007 Diabetes mellitus, type II:  proteinuria     eye exam:  Dr. Elonda Husky Sept 18     Proteinuria:  micro/al:  113 Jul 06 Hypertension GERD Hyperlipidemia Paroxysmal atrial  tachycardia:  Awaiting results of ambulatory heart monitor from Dr. Jens Som (also wearing life vest):   Cardiomyopathy, Nonischemic, tachycardia related:  Cath Dec 2007-normal; EF-20-25% ED  Past Surgical History: Last updated: 02/17/2008 Unremarkable  Family History: Last updated: 02/17/2008 Family History of Diabetes: Mother & Brother  Social History: Last updated: 02/17/2008 Occupation: Production assistant, radio Patient has never smoked.  Alcohol Use - no Illicit Drug Use - no  Risk Factors: Caffeine Use: tea (02/20/2010) Exercise: no (02/20/2010)  Risk Factors: Smoking Status: never (02/20/2010)  Social History: Caffeine use/day:  tea  Review of Systems General:  Denies chills, fatigue, malaise, and sleep disorder. Eyes:  Denies  blurring. CV:  Denies chest pain or discomfort, lightheadness, and shortness of breath with exertion. Resp:  Denies shortness of breath. GI:  Denies abdominal pain, constipation, and loss of appetite. GU:  Denies urinary frequency and urinary hesitancy. MS:  Complains of joint pain and joint swelling; left ankle .  Physical Exam  General:  Well-developed,well-nourished,in no acute distress; alert,appropriate and cooperative throughout examination Head:  Normocephalic and atraumatic without obvious abnormalities. No apparent alopecia or balding. Eyes:  No corneal or conjunctival inflammation noted. EOMI. Perrla. Ears:  External ear exam shows no significant lesions or deformities.  Otoscopic examination reveals clear canals, tympanic membranes are intact bilaterally without bulging, retraction, inflammation or discharge. Hearing is grossly normal bilaterally. Mouth:  Oral mucosa and oropharynx without lesions or exudates.  Teeth in good repair. Neck:  No deformities, masses, or tenderness noted. Lungs:  Normal respiratory effort, chest expands symmetrically. Lungs are clear to auscultation, no crackles or wheezes. Heart:  Normal rate and regular rhythm. S1 and S2 normal without gallop, murmur, click, rub or other extra sounds. Abdomen:  Bowel sounds positive,abdomen soft and non-tender without masses, organomegaly or hernias noted. Pulses:  R and L carotid,radial,dorsalis pedis are full equal bilaterally except decreased right  posterior tibial pulses Extremities:  2+ left pedal edema and 1+ right pedal edema.   Neurologic:  No cranial nerve deficits noted. Station and gait are normal. Plantar reflexes are down-going bilaterally. DTRs are symmetrical throughout. Sensory, motor and coordinative functions appear intact. Cervical Nodes:  No lymphadenopathy noted Psych:  Cognition and judgment appear intact. Alert and cooperative with normal attention span and concentration. No apparent delusions,  illusions, hallucinations  Diabetes Management Exam:    Foot Exam (with socks and/or shoes not present):       Sensory-Monofilament:          Left foot: normal          Right foot: normal   Impression & Recommendations:  Problem # 1:  HYPERLIPIDEMIA (ICD-272.4) Check lipid panel today. Patient is not aware what is taking. Patient was asked to come back in 2 days with all his medication.  Reevaluate at the next visit for further management.   His updated medication list for this problem includes:    Crestor 40 Mg Tabs (Rosuvastatin calcium) .Marland Kitchen... Take 1 pill by mouth daily.  Future Orders: T-Lipid Profile [id number]) ... 02/21/2010  Problem # 2:  HYPERTENSION (ICD-401.9) BP elevated at this visit. Patient is not aware what is taking. Patient was asked to come back in 2 days with all his medication.  Reevaluate at the next visit for further management.   His updated medication list for this problem includes:    Carvedilol 25 Mg Tabs (Carvedilol) .Marland Kitchen... Take 1 tablet by mouth two times a day    Norvasc 10 Mg Tabs (Amlodipine besylate) .Marland Kitchen... Take  1 pill by mouth daily.    Zestril 10 Mg Tabs (Lisinopril) .Marland Kitchen... Take 1 pill by mouth daily.    Furosemide 80 Mg Tabs (Furosemide) .Marland Kitchen... Take 1 pill by mouth daily, per dr. Caryl Comes  Future Orders: T-Comprehensive Metabolic Panel ([id number]) ... 02/21/2010  Problem # 3:  DIABETES MELLITUS, TYPE II (ICD-250.00) CBG 347, HgbA1c 9.6 (03/2009), 8.6 today currently should be on Humalin R 30 am and 20 pm, Humalin N  70 am and  Metformin 1000 mg two times a day . Metformin stopped a couple of month ago because no more prescription. A week ago was running low on Insulin and adjusted accordingly. Since today does not have any Humalin  R left.  Patient is not aware what is taking. Patient was asked to come back in 2 days with all his medication.  Reevaluate at the next visit for further management.   His updated medication list for this problem  includes:    Humulin R 100 Unit/ml Soln (Insulin regular human) ..... Inject 30 units in the morning and 20 units at night (may mix with humalin n)    Humulin N 100 Unit/ml Susp (Insulin isophane human) ..... Inject 70 units in the morning before breakfast and 60 units at night before bed subcutaneously (may mix with humalin r before breakfast)    Aspirin 81 Mg Tbec (Aspirin) .Marland Kitchen... Take 1 tablet by mouth once a day    Zestril 10 Mg Tabs (Lisinopril) .Marland Kitchen... Take 1 pill by mouth daily.    Metformin Hcl 1000 Mg Tabs (Metformin hcl) .Marland Kitchen... Take 1 tablet by mouth two times a day.  Orders: T- Capillary Blood Glucose ([id number]) T-Hgb A1C (in-house) ([id number])Future Orders: T-Urine Microalbumin w/creat. ratio [id number]) ... 02/21/2010  Problem # 4:  GERD (ICD-530.81) Patient is not aware what is taking. Patient was asked to come back in 2 days with all his medication.  Reevaluate at the next visit for further management.  His updated medication list for this problem includes:    Prilosec 40 Mg Cpdr (Omeprazole) .Marland Kitchen... Take 1 tablet by mouth once a day  Future Orders: T-CBC No Diff [id number]) ... 02/21/2010  Complete Medication List: 1)  Humulin R 100 Unit/ml Soln (Insulin regular human) .... Inject 30 units in the morning and 20 units at night (may mix with humalin n) 2)  Humulin N 100 Unit/ml Susp (Insulin isophane human) .... Inject 70 units in the morning before breakfast and 60 units at night before bed subcutaneously (may mix with humalin r before breakfast) 3)  Aspirin 81 Mg Tbec (Aspirin) .... Take 1 tablet by mouth once a day 4)  Carvedilol 25 Mg Tabs (Carvedilol) .... Take 1 tablet by mouth two times a day 5)  Prilosec 40 Mg Cpdr (Omeprazole) .... Take 1 tablet by mouth once a day 6)  Cialis 10 Mg Tabs (Tadalafil) .... Take 1 tablet by mouth once a day as needed for sexual activity 7)  Bd Insulin Syringe 29g X 1/2" 1 Ml Misc (Insulin syringe-needle u-100) .... Use as directed 8)   Glucosamine-chondroitin 1500-1200 Mg/27ml Liqd (Glucosamine-chondroitin) .... Once daily 9)  Onetouch Ultra Test Strp (Glucose blood) .... Check blood sugars before each meal 10)  Onetouch Ultrasoft Lancets Misc (Lancets) .... Use to test your blood sugar before each meal each day 11)  Norvasc 10 Mg Tabs (Amlodipine besylate) .... Take 1 pill by mouth daily. 12)  Crestor 40 Mg Tabs (Rosuvastatin calcium) .... Take 1 pill by mouth daily. 13)  Zestril 10  Mg Tabs (Lisinopril) .... Take 1 pill by mouth daily. 14)  Tylenol Extra Strength 500 Mg Tabs (Acetaminophen) .... Take 1 pill by mouth daily as needed for pain. 15)  Furosemide 80 Mg Tabs (Furosemide) .... Take 1 pill by mouth daily, per dr. Caryl Comes 16)  Metformin Hcl 1000 Mg Tabs (Metformin hcl) .... Take 1 tablet by mouth two times a day. 17)  Tramadol Hcl 50 Mg Tabs (Tramadol hcl) .... Take 1 pill by mouth three times a day as needed for pain. 18)  Onetouch Ultra Mini W/device Kit (Blood glucose monitoring suppl) .... Use to check blood sugar durng day at work before and after lunch  Patient Instructions: 1)  Follw up in this week max 2-3 days.  2)  BMP prior to visit, ICD-9: 3)  Hepatic Panel prior to visit, ICD-9: 4)  Lipid Panel prior to visit, ICD-9: 5)  CBC w/ Diff prior to visit, ICD-9: 6)  Urine Microalbumin prior to visit, ICD-9: 7)  Limit your Sodium (Salt). 8)  It is important that you exercise regularly at least 20 minutes 5 times a week. If you develop chest pain, have severe difficulty breathing, or feel very tired , stop exercising immediately and seek medical attention. Prescriptions: HUMULIN R 100 UNIT/ML SOLN (INSULIN REGULAR HUMAN) Inject 30 units in the morning and 20 units at night (may mix with Humalin N)  #1 vial x 1   Entered and Authorized by:   Rosalia Hammers MD   Signed by:   Rosalia Hammers MD on 02/20/2010   Method used:   Electronically to        [facility] [address] (retail)       [place]       [place],   [address]       Ph: [phone]       Fax: [phone]   RxID:   [id number]   Prevention & Chronic Care Immunizations   Influenza vaccine: Not documented   Influenza vaccine deferral: Deferred  (02/20/2010)    Tetanus booster: Not documented    Pneumococcal vaccine: Not documented   Pneumococcal vaccine deferral: Not indicated  (02/20/2010)    Immunization comments: Flu shot: Get it at work every year.   Colorectal Screening   Hemoccult: Negative  (12/29/2004)    Colonoscopy: Location:  [facility].    (02/26/2008)   Colonoscopy due: 03/2013  Other Screening   PSA: Not documented  Reports requested:   Last colonoscopy report requested.  Smoking status: never  (02/20/2010)  Diabetes Mellitus   HgbA1C: 8.6  (02/20/2010)   HgbA1C action/deferral: Deferred  (01/24/2009)    Eye exam: Proliferative diabetic retinopathy.   Severe OU- referral to Reintal specialist Exam  by Monna Fam   (09/01/2008)   Last eye exam report requested.   Diabetic eye exam action/deferral: Deferred  (01/24/2009)   Eye exam due: 12/2008    Foot exam: yes  (02/20/2010)   Foot exam action/deferral: Do today   High risk foot: No  (02/20/2010)   Foot care education: Done  (02/20/2010)   Foot exam due: 08/24/2010    Urine microalbumin/creatinine ratio: 45.5  (12/22/2008)   Urine microalbumin action/deferral: Ordered    Diabetes flowsheet reviewed?: Yes   Progress toward A1C goal: Improved  Lipids   Total Cholesterol: 162  (03/24/2009)   Lipid panel action/deferral: Deferred   LDL: 90  (03/24/2009)   LDL Direct: 247.6  (06/18/2007)  HDL: 46  (03/24/2009)   Triglycerides: 131  (03/24/2009)    SGOT (AST): 19  (03/24/2009)   BMP action: Deferred   SGPT (ALT): 19  (03/24/2009) CMP ordered    Alkaline phosphatase: 101  (03/24/2009)   Total bilirubin: 0.3  (03/24/2009)    Lipid flowsheet reviewed?: Yes   Progress toward LDL goal:  At goal  Hypertension   Last Blood Pressure: 150 / 86  (02/20/2010)   Serum creatinine: 1.41  (03/24/2009)   BMP action: Deferred   Serum potassium 4.1  (03/24/2009) CMP ordered     Hypertension flowsheet reviewed?: Yes   Progress toward BP goal: Deteriorated  Self-Management Support :   Personal Goals (by the next clinic visit) :     Personal A1C goal: 7  (03/15/2009)     Personal blood pressure goal: 140/90  (03/15/2009)     Personal LDL goal: 70  (03/15/2009)    Patient will work on the following items until the next clinic visit to reach self-care goals:     Medications and monitoring: take my medicines every day, check my blood sugar, bring all of my medications to every visit, examine my feet every day  (02/20/2010)     Eating: drink diet soda or water instead of juice or soda, eat more vegetables, use fresh or frozen vegetables, eat foods that are low in salt, eat baked foods instead of fried foods, eat fruit for snacks and desserts  (02/20/2010)     Activity: take a 30 minute walk every day  (02/20/2010)     Other: walks a lot "on the job" - wants to start using stationary bike  (01/24/2009)    Diabetes self-management support: Copy of home glucose meter record, Education handout, Written self-care plan  (02/20/2010)   Diabetes care plan printed   Diabetes education handout printed   Last diabetes self-management training by diabetes educator: 03/23/2009   Last medical nutrition therapy: 05/13/2008    Hypertension self-management support: Copy of home glucose meter record, Education handout, Written self-care plan  (02/20/2010)   Hypertension self-care plan printed.   Hypertension education handout printed    Lipid self-management support: Copy of home glucose meter record, Education handout, Written self-care plan  (02/20/2010)   Lipid self-care plan printed.   Lipid education handout printed   Nursing Instructions: Diabetic foot exam today Request report of last  colonoscopy Request report of last diabetic eye exam    Diabetic Foot Exam Last Podiatry Exam Date: 02/20/2010  Foot Inspection Is there a history of a foot ulcer?              No Is there a foot ulcer now?              No Can the patient see the bottom of their feet?          No Are the shoes appropriate in style and fit?          Yes Is there swelling or an abnormal foot shape?          No Are the toenails long?                No Are the toenails thick?                No Are the toenails ingrown?              No Is there heavy callous build-up?  No Is there pain in the calf muscle (Intermittent claudication) when walking?    NoIs there a claw toe deformity?              No Is there elevated skin temperature?            No Is there limited ankle dorsiflexion?            No Is there foot or ankle muscle weakness?            No  Diabetic Foot Care Education Patient educated on appropriate care of diabetic feet.  Pulse Check          Right Foot          Left Foot Posterior Tibial:        absent            normal Dorsalis Pedis:        normal            normal Comments: Discolorations of toes (big toe/2nd toe) noted bilaterally. High Risk Feet? No Set Next Diabetic Foot Exam here: 08/24/2010   10-g (5.07) Semmes-Weinstein Monofilament Test Performed by: Morrison Old RN          Right Foot          Left Foot Visual Inspection     normal         normal Test Control      normal         normal Site 1         normal         normal Site 2         normal         normal Site 3         normal         normal Site 4         normal         normal Site 5         normal         normal Site 6         normal         normal Site 7         normal         normal Site 8         normal         normal Site 9         normal         normal  Impression      normal         normal  Process Orders Check Orders Results:     [facility]: [id number] not required for this  insurance Tests Sent for requisitioning (February 22, 2010 8:50 AM):     02/21/2010: [facility] -- T-Urine Microalbumin w/creat. ratio [[id number]] (signed)     02/21/2010: [facility] -- T-Comprehensive Metabolic Panel [id number] (signed)     02/21/2010: [facility] -- T-Lipid Profile [id number] (signed)     02/21/2010: [facility] -- T-CBC No Diff [id number] (signed)     Laboratory Results   Blood Tests   Date/Time Received: February 20, 2010 11:02 AM Date/Time Reported: Maryan Rued  February 20, 2010 11:02 AM    HGBA1C: 8.6%   (Normal Range: Non-Diabetic - 3-6%   Control Diabetic - 6-8%) CBG Random:: 347mg /dL

## 2010-07-06 NOTE — Assessment & Plan Note (Signed)
Summary: EST-CK/FU/ AND LAB WORK/CFB   Vital Signs:  Patient profile:   55 year old male Height:      71 inches (180.34 cm) Weight:      289.9 pounds (131.77 kg) BMI:     40.58 Temp:     98.3 degrees F oral Pulse rate:   83 / minute BP sitting:   171 / 92  (right arm) Cuff size:   large  Vitals Entered By: Morrison Old RN (April 10, 2010 1:44 PM) Is Patient Diabetic? Yes Did you bring your meter with you today? No Pain Assessment Patient in pain? no      Nutritional Status BMI of > 30 = obese CBG Result 210  Have you ever been in a relationship where you felt threatened, hurt or afraid?No   Does patient need assistance? Functional Status Self care Ambulation Normal   Primary Care Esmay Amspacher:  Rosalia Hammers MD   History of Present Illness: This is a 55 year old male with h/o  DM (including Diabetic retinopathy, Renal insufficiency), HTN, HLD , paroxysmal atrial tachycardia and Cardiomyopathy followed by Dr Caryl Comes who is seen in the clinic for acute renal failure with a Crea of 2.36 after increasing Lisinopril from 5 mg to 20 mg.   1. Acute renal failure: Creatinine increased  from 1.24 on 10/14 to 2.36. Patient denies any symptoms: weakness, dizziness, chest pain, nausea, vomiting, diarrhea.   2.. BP today 171/92 . Patient did not take any medication today since I infomed him to stop taking Lisinopril and Lasix 80 mg furthermore patient ran out of Carvedilol 25 mg since Saturday.   3. DM. CBG 210. Currently on Lantus 100 units once a day. Blood glucose fasting mostly above 140 looking at the Glucometer.      Depression History:      The patient denies a depressed mood most of the day and a diminished interest in his usual daily activities.         Preventive Screening-Counseling & Management  Alcohol-Tobacco     Alcohol type: very seldom     Smoking Status: never  Caffeine-Diet-Exercise     Caffeine use/day: tea     Does Patient Exercise: no     Type of  exercise: walks on the job  Current Medications (verified): 1)  Aspirin 81 Mg Tbec (Aspirin) .... Take 1 Tablet By Mouth Once A Day 2)  Carvedilol 25 Mg  Tabs (Carvedilol) .... Take 1 Tablet By Mouth Two Times A Day 3)  Cialis 10 Mg  Tabs (Tadalafil) .... Take 1 Tablet By Mouth Once A Day As Needed For Sexual Activity 4)  Bd Insulin Syringe 29g X 1/2" 1 Ml  Misc (Insulin Syringe-Needle U-100) .... Use As Directed 5)  Onetouch Ultra Test  Strp (Glucose Blood) .... Check Blood Sugars Before Each Meal 6)  Onetouch Ultrasoft Lancets  Misc (Lancets) .... Use To Test Your Blood Sugar Before Each Meal Each Day 7)  Metformin Hcl 1000 Mg Tabs (Metformin Hcl) .... Take 1 Tablet By Mouth Two Times A Day. 8)  Tramadol Hcl 50 Mg Tabs (Tramadol Hcl) .... Take 1 Pill By Mouth Three Times A Day As Needed For Pain. 9)  Onetouch Ultra Mini W/device Kit (Blood Glucose Monitoring Suppl) .... Use To Check Blood Sugar Durng Day At Work Before and After Lunch 10)  Fish Oil 1200 Mg Caps (Omega-3 Fatty Acids) .... Taking One Tablet Once A Day 11)  Osteo Bi-Flex Regular Strength 250-200 Mg Tabs (  Glucosamine-Chondroitin) .... Taking 4 Caplets Per Day With Food. 12)  Lantus 100 Unit/ml Soln (Insulin Glargine) .... Take 110 Units Once A Day in The Morning.  You Can Split 55 Units and 55 Units and Inject It At Two Locations. 13)  Pravastatin Sodium 40 Mg Tabs (Pravastatin Sodium) .... Take One Tablet By Mouth Once A Day in The Evening.  Allergies: No Known Drug Allergies  Review of Systems  The patient denies weight gain, chest pain, syncope, dyspnea on exertion, peripheral edema, headaches, abdominal pain, and muscle weakness.    Physical Exam  General:  Well-developed,well-nourished,in no acute distress; alert,appropriate and cooperative throughout examination Mouth:  Oral mucosa and oropharynx without lesions or exudates.  Teeth in good repair. Lungs:  Normal respiratory effort, chest expands symmetrically. Lungs  are clear to auscultation, no crackles or wheezes. Heart:  Normal rate and regular rhythm. S1 and S2 normal without gallop, murmur, click, rub or other extra sounds. Abdomen:  Bowel sounds positive,abdomen soft and non-tender without masses, organomegaly or hernias noted. Extremities:  No clubbing, cyanosis, edema, or deformity noted with normal full range of motion of all joints.     Impression & Recommendations:  Problem # 1:  RENAL FAILURE, ACUTE (ICD-584.9) Creatinine increased  from 1.24 on 10/14 to 2.36 after increasing Lisinopril from 5 mg to 20 mg. Patient was tolerating Lisinopril 5 mg and Lasix 80 mg ( prescribed since March/2011 by Dr Klein-Cardiologist). We will stop Lisinopril and Lasix for now. Repeat Bmet in 1 week and if Ceatinine normalizes I will  restart Lasix 40 mg and Lisinopril 5 mg with possible slow dosage increase if BP contunes to be elevated.   Future Orders: T-Basic Metabolic Panel ([id number]) ... 04/17/2010  Problem # 2:  HYPERTENSION (ICD-401.9) BP today 171/92 . Patient did not take any medication today since I infomed him to stop taking Lisinopril and Lasix 80 mg furthermore patient ran out of Carvedilol 25 mg since Saturday.  Due to acute renal failure I will stop Lisinopril and Lasix for 1 week. Repeat a Bmet and if Ceatinine normalizes I will start restart Lasix 40 mg and Lisinopril 5 mg with possible slow incease of dose. At this point I recommend to continue on Carvedilol 25 mg two times a day.   The following medications were removed from the medication list:    Furosemide 80 Mg Tabs (Furosemide) .Marland Kitchen... Take 1 pill by mouth daily, per dr. Caryl Comes    Lisinopril 20 Mg Tabs (Lisinopril) .Marland Kitchen... Take one tablet by mouth once a day. His updated medication list for this problem includes:    Carvedilol 25 Mg Tabs (Carvedilol) .Marland Kitchen... Take 1 tablet by mouth two times a day  Problem # 3:  DIABETES MELLITUS, TYPE II (ICD-250.00) Fasting blood glucose mostly above 140.  Discussed with [name] who recommended an increase of Lantus to 110 units per day. Patient encouraged to check blood glucose level on  a regular basis.   The following medications were removed from the medication list:    Lisinopril 20 Mg Tabs (Lisinopril) .Marland Kitchen... Take one tablet by mouth once a day. His updated medication list for this problem includes:    Aspirin 81 Mg Tbec (Aspirin) .Marland Kitchen... Take 1 tablet by mouth once a day    Metformin Hcl 1000 Mg Tabs (Metformin hcl) .Marland Kitchen... Take 1 tablet by mouth two times a day.    Lantus 100 Unit/ml Soln (Insulin glargine) .Marland Kitchen... Take 110 units once a day in the morning.  you can  split 55 units and 55 units and inject it at two locations.  Orders: Capillary Blood Glucose/CBG RC:[id number])  Complete Medication List: 1)  Aspirin 81 Mg Tbec (Aspirin) .... Take 1 tablet by mouth once a day 2)  Carvedilol 25 Mg Tabs (Carvedilol) .... Take 1 tablet by mouth two times a day 3)  Cialis 10 Mg Tabs (Tadalafil) .... Take 1 tablet by mouth once a day as needed for sexual activity 4)  Bd Insulin Syringe 29g X 1/2" 1 Ml Misc (Insulin syringe-needle u-100) .... Use as directed 5)  Onetouch Ultra Test Strp (Glucose blood) .... Check blood sugars before each meal 6)  Onetouch Ultrasoft Lancets Misc (Lancets) .... Use to test your blood sugar before each meal each day 7)  Metformin Hcl 1000 Mg Tabs (Metformin hcl) .... Take 1 tablet by mouth two times a day. 8)  Tramadol Hcl 50 Mg Tabs (Tramadol hcl) .... Take 1 pill by mouth three times a day as needed for pain. 9)  Onetouch Ultra Mini W/device Kit (Blood glucose monitoring suppl) .... Use to check blood sugar durng day at work before and after lunch 10)  Fish Oil 1200 Mg Caps (Omega-3 fatty acids) .... Taking one tablet once a day 11)  Osteo Bi-flex Regular Strength 250-200 Mg Tabs (Glucosamine-chondroitin) .... Taking 4 caplets per day with food. 12)  Lantus 100 Unit/ml Soln (Insulin glargine) .... Take 110 units once a day  in the morning.  you can split 55 units and 55 units and inject it at two locations. 13)  Pravastatin Sodium 40 Mg Tabs (Pravastatin sodium) .... Take one tablet by mouth once a day in the evening.  Patient Instructions: 1)  Please schedule a follow-up appointment in 1 week for Blood work ( BMP) . 2)  It is important that you exercise regularly at least 20 minutes 5 times a week. If you develop chest pain, have severe difficulty breathing, or feel very tired , stop exercising immediately and seek medical attention. 3)  You need to lose weight. Consider a lower calorie diet and regular exercise.  4)  Check your blood sugars regularly. If your readings are usually above : or below 70 you should contact our office. 5)  Check your feet each night for sore areas, calluses or signs of infection. 6)  Check your Blood Pressure regularly. If it is above: you should make an appointment. 7)  Limit your Sodium (Salt).   Orders Added: 1)  Capillary Blood Glucose/CBG [82948] 2)  T-Basic Metabolic Panel 0000000    Prevention & Chronic Care Immunizations   Influenza vaccine: Not documented   Influenza vaccine deferral: Deferred  (02/22/2010)    Tetanus booster: Not documented    Pneumococcal vaccine: Not documented   Pneumococcal vaccine deferral: Not indicated  (02/20/2010)  Colorectal Screening   Hemoccult: Negative  (12/29/2004)    Colonoscopy: Location:  [facility].    (02/26/2008)   Colonoscopy due: 03/2013  Other Screening   PSA: Not documented   Smoking status: never  (04/10/2010)  Diabetes Mellitus   HgbA1C: 8.6  (02/20/2010)   HgbA1C action/deferral: Deferred  (01/24/2009)    Eye exam: Proliferative diabetic retinopathy.   Severe OU- referral to Reintal specialist Exam  by Monna Fam   (09/01/2008)   Diabetic eye exam action/deferral: Deferred  (01/24/2009)   Eye exam due: 12/2008    Foot exam: yes  (02/20/2010)   Foot exam action/deferral: Do  today   High risk foot: No  (02/20/2010)  Foot care education: Done  (02/20/2010)   Foot exam due: 08/24/2010    Urine microalbumin/creatinine ratio: 1192.6  (02/22/2010)   Urine microalbumin action/deferral: Ordered  Lipids   Total Cholesterol: 341  (02/22/2010)   Lipid panel action/deferral: Deferred   LDL: 257  (02/22/2010)   LDL Direct: 247.6  (06/18/2007)   HDL: 45  (02/22/2010)   Triglycerides: 195  (02/22/2010)    SGOT (AST): 12  (02/22/2010)   BMP action: Deferred   SGPT (ALT): 13  (02/22/2010)   Alkaline phosphatase: 104  (02/22/2010)   Total bilirubin: 0.5  (02/22/2010)  Hypertension   Last Blood Pressure: 171 / 92  (04/10/2010)   Serum creatinine: 1.24  (03/17/2010)   BMP action: Deferred   Serum potassium 4.2  (03/17/2010)  Self-Management Support :   Personal Goals (by the next clinic visit) :     Personal A1C goal: 7  (03/15/2009)     Personal blood pressure goal: 140/90  (03/15/2009)     Personal LDL goal: 70  (03/15/2009)    Patient will work on the following items until the next clinic visit to reach self-care goals:     Medications and monitoring: take my medicines every day, bring all of my medications to every visit, examine my feet every day  (04/10/2010)     Eating: drink diet soda or water instead of juice or soda, eat more vegetables, use fresh or frozen vegetables, eat foods that are low in salt, eat baked foods instead of fried foods, eat fruit for snacks and desserts  (04/10/2010)     Activity: take a 30 minute walk every day  (04/10/2010)     Other: walks a lot "on the job" - wants to start using stationary bike  (01/24/2009)    Diabetes self-management support: Written self-care plan  (04/10/2010)   Diabetes care plan printed   Last diabetes self-management training by diabetes educator: 03/23/2009   Last medical nutrition therapy: 05/13/2008    Hypertension self-management support: Written self-care plan  (04/10/2010)   Hypertension  self-care plan printed.    Lipid self-management support: Written self-care plan  (04/10/2010)   Lipid self-care plan printed.  Process Orders Check Orders Results:     [facility]: [id number] not required for this insurance Tests Sent for requisitioning (April 10, 2010 7:21 PM):     04/17/2010: [facility] -- T-Basic Metabolic Panel 0000000 (signed)     Process Orders Check Orders Results:     [facility]: [id number] not required for this insurance Tests Sent for requisitioning (April 10, 2010 7:21 PM):     04/17/2010: [facility] -- T-Basic Metabolic Panel 0000000 (signed)

## 2010-07-06 NOTE — Progress Notes (Signed)
   Phone Note Other Incoming   Caller: [employer] Details for Reason: no c/b from pt regarding monitor  Follow-up for Phone Call        [employer] has tried about 9 times to contact patient and has left numerous messages.  Pt will not return call. They will deactivate account.  Should pt return calls, pt will be reactivated.  Follow-up by: Joan Flores RN,  May 15, 2010 11:26 AM  Additional Follow-up for Phone Call Additional follow up Details #1::        [email or web address] Joan Flores, RN, BSN [id number] [id number]

## 2010-07-06 NOTE — Letter (Signed)
Summary: ONE TOUCH  ONE TOUCH   Imported By: Garlan Fillers 04/17/2010 11:36:53  _____________________________________________________________________  External Attachment:    Type:   Image     Comment:   External Document

## 2010-07-06 NOTE — Progress Notes (Signed)
Summary: REFILL/GG  Phone Note Refill Request  on January 26, 2010 4:50 PM  Refills Requested: Medication #1:  ONETOUCH ULTRA TEST  STRP check blood sugars before each meal   Dosage confirmed as above?Dosage Confirmed   Supply Requested: 3 months   Last Refilled: 11/20/2009  Method Requested: Electronic Initial call taken by: Gevena Cotton RN,  January 26, 2010 4:50 PM  Follow-up for Phone Call        Please schedule a follow-up appointment with Dr. Newt Lukes some time in October as [name] has not been seen in clinic in nearly 10 months and he has diabetes.  Thank You. Follow-up by: Oval Linsey MD,  January 26, 2010 5:00 PM  Additional Follow-up for Phone Call Additional follow up Details #1::        Called patient unable to speak with him. Left a message on his voice mail asking him to return phone call in regards to his February 20, 2010 appointment with Dr. Newt Lukes. Additional Follow-up by: Enedina Finner,  January 30, 2010 10:04 AM    Prescriptions: [name] TEST  STRP (GLUCOSE BLOOD) check blood sugars before each meal  #90 x 2   Entered and Authorized by:   Oval Linsey MD   Signed by:   Oval Linsey MD on 01/26/2010   Method used:   Electronically to        [facility] (retail)       [address]       [place], [place]  [address]       Ph: [phone]       Fax: [phone]   RxID:   [id number]

## 2010-07-06 NOTE — Progress Notes (Signed)
Summary: meter download/dmr  ---- Converted from flag ---- ---- 03/18/2010 4:34 PM, Rosalia Hammers MD wrote: Patient will check his BG level every morning and was asked to come in two weeks with his meter to check the results and to make changes in his medication regimen accordingly. He is also scheduled for lab workd. Please follow up with this patient.  Thank you. ------------------------------  Phone Note Other Incoming   Caller: patient came in for lab work and had meter downloaded Summary of Call: reveiw of meter download shows daily fasting blood sugar x 2 weeks: median fasting blood sugar of 134mg /dl with 11/17 readings < 140 and 6 > 140 and less than 183mg /dl. Consider increasing basal insulin ( ~10%) to acheive more blood sugars in target range (75%-90%) .  Happy to call patient with order if physician agrees. Initial call taken by: [name] RD,CDE,  April 05, 2010 9:13 AM  Follow-up for Phone Call        Discussed with Dr. Newt Lukes Follow-up by: [name] RD,CDE,  April 10, 2010 2:44 PM

## 2010-07-06 NOTE — Progress Notes (Signed)
Summary: refill/ hla  Phone Note Refill Request Message from:  Fax from Pharmacy on May 22, 2010 5:18 PM  Refills Requested: Medication #1:  TRAMADOL HCL 50 MG TABS take 1 pill by mouth three times a day as needed for pain.   Dosage confirmed as above?Dosage Confirmed   Last Refilled: 11/15 last visit 12/16  Initial call taken by: Freddy Finner RN,  May 22, 2010 5:19 PM    Prescriptions: TRAMADOL HCL 50 MG TABS (TRAMADOL HCL) take 1 pill by mouth three times a day as needed for pain.  #60 x 1   Entered and Authorized by:   Rosalia Hammers MD   Signed by:   Rosalia Hammers MD on 05/23/2010   Method used:   Electronically to        [facility] (retail)       [address]       [place],   [address]       Ph: [phone]       Fax: [phone]   RxID:   [id number]

## 2010-07-06 NOTE — Assessment & Plan Note (Signed)
Summary: EST-2 DAY RECHECK/CH   Vital Signs:  Patient profile:   55 year old male Height:      71 inches (180.34 cm) Weight:      291.7 pounds (132.59 kg) BMI:     40.83 Temp:     97.1 degrees F (36.17 degrees C) oral Pulse rate:   68 / minute BP sitting:   144 / 82  (right arm) Cuff size:   large  Vitals Entered By: Mateo Flow Deborra Medina) (February 22, 2010 8:41 AM) CC: REVIEW MEDS Is Patient Diabetic? Yes  Does patient need assistance? Functional Status Self care Ambulation Normal   Primary Care Provider:  Sherrilyn Rist  CC:  REVIEW MEDS.  History of Present Illness: This is a 55 year old male with h/o  DM (including Diabetic retinopathy, Renal insufficiency), HTN, HLD , paroxysmal atrial tachycardia and Cardiomyopathy followed by Dr Caryl Comes who presents her for review of his medication since at the last visit it was not aware what he was taking.   1. DM : Today CBG 344, Hgb 8.6 on 9/19 . Currently only taking Humalin N 70 units in the morning before bedtime ( He was suppose to take Humalin N 70 units am and 60 units pm, Humalin R 30 units am and 20 units pm and Metformin 1000 mg two times a day)  2. BP today 144/82  currently on Lasix 80mg   and Carvedilol 25mg   3. 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 Take 1 Tablet By Mouth  Once A Day 2)  Carvedilol 25 Mg  Tabs (Carvedilol) .... Take 1 Tablet By Mouth Two Times A Day 3)  Cialis 10 Mg  Tabs (Tadalafil) .... Take 1 Tablet By Mouth Once A Day As Needed For Sexual Activity 4)  Bd Insulin Syringe 29g X 1/2" 1 Ml  Misc (Insulin Syringe-Needle U-100) .... Use As Directed 5)  Onetouch Ultra Test  Strp (Glucose Blood) .... Check Blood Sugars Before Each Meal 6)  Onetouch Ultrasoft Lancets  Misc (Lancets) .... Use To Test Your Blood Sugar Before Each Meal Each Day 7)  Furosemide 80 Mg Tabs (Furosemide) .... Take 1 Pill By Mouth Daily, Per Dr. Caryl Comes 8)  Metformin Hcl 1000 Mg Tabs (Metformin Hcl) .... Take 1 Tablet By Mouth Two Times A Day. 9)  Tramadol Hcl 50 Mg Tabs (Tramadol Hcl) .... Take 1 Pill By Mouth Three Times A Day As Needed For Pain. 10)  Onetouch Ultra Mini W/device Kit (Blood Glucose Monitoring Suppl) .... Use To Check Blood Sugar Durng Day At Work Before and After Lunch 11)  Fish Oil 1200 Mg Caps (Omega-3 Fatty Acids) .... Taking One Tablet Once A Day 12)  Osteo Bi-Flex Regular Strength 250-200 Mg Tabs (Glucosamine-Chondroitin) .... Taking 4 Caplets Per Day With Food. 13)  Lisinopril 5 Mg Tabs (Lisinopril) .... Take One Tablet By Mouth Once  A Day. 14)  Lantus 100 Unit/ml Soln (Insulin Glargine) .... Take 100 Units Once A Day in The Morning.  You Can Split 50 Units and 50 Units and Inject It At Two Locations.  Allergies: No Known Drug Allergies  Past History:  Past Medical History: Diabetes mellitus, type II:  proteinuria     eye exam:  Dr. Elonda Husky Sept 18     Proteinuria:  micro/al:  113 Jul 06 Hypertension GERD Hyperlipidemia Paroxysmal atrial tachycardia:  Awaiting results of ambulatory heart monitor from Dr. Caryl Comes (also wearing life vest):   Cardiomyopathy, Nonischemic, tachycardia related:  Cath Dec 2007-normal; EF-20-25% ED  Physical Exam  General:  Well-developed, overweight -appearing , in no acute distress; alert,appropriate and cooperative  throughout examination Lungs:  Normal respiratory effort, chest expands symmetrically. Lungs are clear to auscultation, no crackles or wheezes. Heart:  Normal rate and regular rhythm. S1 and S2 normal without gallop, murmur, click, rub or other extra sounds. Abdomen:  Bowel sounds positive,abdomen soft and non-tender without masses, organomegaly or hernias noted. Pulses:  R and L carotid,radial,dorsalis pedis and posterior tibial pulses are full and equal bilaterally Extremities:  No clubbing, cyanosis, edema, or deformity noted with normal full range of motion of all joints.     Impression & Recommendations:  Problem # 1:  DIABETES MELLITUS, TYPE II (ICD-250.00) Today CBG 344, Hgb 8.6 on 9/19. Patient was taking Humalin N 70 units since one week since he he had no more prescription for metformin and Humalin R. At this point it was discussed in extense with patient that we need to simplify medication regimen to increase compliance.  We started patient on lantus 100 units and he was advised that he can take 2 times 50 units shots at two different location for better absorption and less pain. Furthermore patient was advised to take Metformin 1000 mg two times a day . He was encouraged to take the medications and check blood glucose level on a regular basis and bring in his meds and his meter at the next visit to reevaluate for possible adjustment in his regimen. Patient need agressive therapy since patient already has Diabetic retinopathy and Renal insufficiency.   The following medications were removed from the medication list:    Humulin R 100 Unit/ml Soln (Insulin regular human) ..... Inject 30 units in the morning and 20 units at night (may mix with humalin n)    Humulin N 100 Unit/ml Susp (Insulin isophane human) ..... Inject 70 units in the morning before breakfast and 60 units at night before bed subcutaneously (may mix with humalin r before breakfast)    Zestril 10 Mg Tabs (Lisinopril) .Marland Kitchen...  Take 1 pill by mouth daily. His updated medication list for this problem includes:    Aspirin 81 Mg Tbec (Aspirin) .Marland Kitchen... Take 1 tablet by mouth once a day    Metformin Hcl 1000 Mg Tabs (Metformin hcl) .Marland Kitchen... Take 1 tablet by mouth two times a day.    Lisinopril 5 Mg Tabs (Lisinopril) .Marland Kitchen... Take one tablet by mouth once a day.    Lantus 100 Unit/ml Soln (Insulin glargine) .Marland Kitchen... Take 100 units once a day in the morning.  you can split 50 units and 50 units and inject it at two locations.  Orders: Diabetic Clinic Referral (Diabetic)  Problem # 2:  HYPERTENSION (ICD-401.9) Assessment: Comment Only BP today 144/82  currently on Lasix 80mg   and Carvedilol 25mg . Considering his DM with renal insuffciency I restarted Lisinopril 5 mg and pt  should be reevaluated in 2 wks for BP control and follow up on BMP for possible changes.   Note:  Patient was started on ACEi in the past. It was stopped in 2009 because of acute renal insufficiency. In 01/ 2010 he was restarted on a lower dosage and it was increased to lisinopril 10 mg but patient has not been taking it for a while . According to records pt should be taking Norvasc but he was not. At this point I would not restart it.    The following medications were removed from the medication list:    Norvasc 10 Mg Tabs (Amlodipine besylate) .Marland Kitchen... Take 1 pill by mouth daily.    Zestril 10 Mg Tabs (Lisinopril) .Marland Kitchen... Take 1 pill by mouth daily. His updated medication list for this problem includes:    Carvedilol 25 Mg Tabs (Carvedilol) .Marland Kitchen... Take 1 tablet by mouth two times a day    Furosemide 80 Mg Tabs (Furosemide) .Marland Kitchen... Take 1 pill by mouth daily, per dr. Caryl Comes    Lisinopril 5 Mg Tabs (Lisinopril) .Marland Kitchen... Take one tablet by mouth once a day.  Problem # 3:  HYPERLIPIDEMIA (P102836.4) Patient was not taking Crestor. His Lipid panel was within normal limits in 2010. He most likely need to be on a statin for cardio-vacular risk reduction especially with h/o HTN and  DM  but at this point I do not think he needs  40 mg Crestor. We will recheck Lipid panel and evaluate at the next visit.    The following medications were removed from the medication list:    Crestor 40 Mg Tabs (Rosuvastatin calcium) .Marland Kitchen... Take 1 pill by mouth daily.  Problem # 4:  Preventive Health Care (ICD-V70.0) Received Vaccination: Tdap. He receives flu vaccination at work.  Colonoscopy: up to date  Problem # 5:  GERD (ICD-530.81) Patient has been not taking omeprazole for a while. He denies any symptoms. Consider not to restart and reevaluate.   The following medications were removed from the medication list:    Prilosec 40 Mg Cpdr (Omeprazole) .Marland Kitchen... Take 1 tablet by mouth once a day  Problem # 6:  PAROXYSMAL ATRIAL TACHYCARDIA (ICD-427.0) Patient has not seen Cardiologist since 03/2009. Recommended to follwo up.  His updated medication list for this problem includes:    Aspirin 81 Mg Tbec (Aspirin) .Marland Kitchen... Take 1 tablet by mouth once a day    Carvedilol 25 Mg Tabs (Carvedilol) .Marland Kitchen... Take 1 tablet by mouth two times a day  Complete Medication List: 1)  Aspirin 81 Mg Tbec (Aspirin) .... Take 1 tablet by mouth once a day 2)  Carvedilol 25 Mg Tabs (Carvedilol) .... Take 1 tablet by mouth two times a day 3)  Cialis 10 Mg Tabs (Tadalafil) .... Take 1 tablet by mouth once a day as needed for sexual activity 4)  Bd Insulin Syringe 29g X 1/2" 1 Ml Misc (Insulin syringe-needle u-100) .... Use as directed 5)  Onetouch Ultra Test Strp (Glucose blood) .... Check blood sugars before each meal 6)  Onetouch Ultrasoft Lancets Misc (Lancets) .... Use to test your blood sugar before each meal each day 7)  Furosemide 80 Mg Tabs (Furosemide) .... Take 1 pill by mouth daily, per dr. Caryl Comes 8)  Metformin Hcl 1000 Mg Tabs (Metformin hcl) .... Take 1 tablet by mouth two times a day. 9)  Tramadol Hcl 50 Mg Tabs (Tramadol hcl) .... Take 1 pill by mouth three times a day as needed for pain. 10)  Onetouch  Ultra Mini W/device Kit (Blood glucose monitoring suppl) .... Use to check blood sugar durng day at work before and after lunch 11)  Fish Oil 1200 Mg Caps (Omega-3 fatty acids) .... Taking one tablet once a day 12)  Osteo Bi-flex Regular Strength 250-200 Mg Tabs (Glucosamine-chondroitin) .... Taking 4 caplets per day with food. 13)  Lisinopril 5 Mg Tabs (Lisinopril) .... Take one tablet by mouth once a day. 14)  Lantus 100 Unit/ml Soln (Insulin glargine) .... Take 100 units once a day in the morning.  you can split 50 units and 50 units and inject it at two locations.  Patient Instructions: 1)  Please schedule a follow-up appointment in 2 weeks for BP check and BMP.  Bring your Glucose meter with you for the next visit.  2)  Please schedule an appointment with your primary doctor in :3 month  3)  It is important that you exercise regularly at least 20 minutes 5 times a week. If you develop chest pain, have severe difficulty breathing, or feel very tired , stop exercising immediately and seek medical attention. 4)  You need to lose weight. Consider a lower calorie diet and regular exercise.  5)  Check your blood sugars regularly. If your readings are usually above : or below 70 you should contact our office. 6)  BMP prior to visit, ICD-9: 7)  Limit your Sodium (Salt). Prescriptions: LANTUS 100 UNIT/ML SOLN (INSULIN GLARGINE) Take 100 units once a day in the morning.  You can split 50 units and 50 units and inject it at two locations.  #4 vials x 4   Entered and Authorized by:   Rosalia Hammers MD   Signed by:   Rosalia Hammers MD on 02/22/2010   Method used:   Electronically to        [facility] (retail)       [address]       [place], [place]  [address]       Ph: [phone]       Fax: [phone]   RxID:   [id number] LISINOPRIL 5 MG TABS (LISINOPRIL) Take one tablet by mouth once a day.  #30 x 0   Entered and Authorized by:   Rosalia Hammers MD    Signed by:   Rosalia Hammers MD on 02/22/2010   Method used:   Electronically to        [facility] (retail)       [address]       [place], [place]  [address]       Ph: [phone]       Fax: [phone]   RxID:   [id number] TRAMADOL HCL 50 MG TABS (TRAMADOL HCL) take 1 pill by mouth three times a day as needed for pain.  #60 x 1   Entered and Authorized by:   Rosalia Hammers MD   Signed by:   Rosalia Hammers MD on 02/22/2010   Method used:   Electronically to        [facility] (retail)       [address]       [place], [place]  [address]       Ph: [phone]       Fax: [phone]   RxID:   [id number] METFORMIN HCL 1000 MG TABS (METFORMIN HCL)  take 1 tablet by mouth two times a day.  #60 x 4   Entered and Authorized by:   Rosalia Hammers MD   Signed by:   Rosalia Hammers MD on 02/22/2010   Method used:   Electronically to        [facility] (retail)       [address]       [place], [place]  [address]       Ph: [phone]       Fax: [phone]   RxID:   [id number] CIALIS 10 MG  TABS (TADALAFIL) Take 1 tablet by mouth once a day as needed for sexual activity  #6 x 4   Entered and Authorized by:   Rosalia Hammers MD   Signed by:   Rosalia Hammers MD on 02/22/2010   Method used:   Electronically to        [facility] (retail)       [address]       [place], [place]  [address]       Ph: [phone]       Fax: [phone]   RxID:   [id number] ASPIRIN 81 MG TBEC (ASPIRIN) Take 1 tablet by mouth once a day  #30 x 4   Entered and Authorized by:   Rosalia Hammers MD   Signed by:   Rosalia Hammers MD on 02/22/2010   Method used:   Electronically to        [facility] (retail)       [address]       [place], [place]  [address]       Ph: [phone]       Fax: [phone]   RxID:    [id number]  Process Orders Check Orders Results:     [facility]: ABN not required for this insurance     Prevention & Chronic Care Immunizations   Influenza vaccine: Not documented   Influenza vaccine deferral: Deferred  (02/22/2010)    Tetanus booster: Not documented    Pneumococcal vaccine: Not documented    Immunization comments: Patient receives flu vaccination at work.   Colorectal Screening   Hemoccult: Negative  (12/29/2004)    Colonoscopy: Location:  [facility].    (02/26/2008)   Colonoscopy due: 03/2013  Other Screening   PSA: Not documented   Smoking status: never  (03/15/2009)  Diabetes Mellitus   HgbA1C: 9.6  (03/15/2009)   HgbA1C action/deferral: Deferred  (01/24/2009)    Eye exam: Proliferative diabetic retinopathy.   Severe OU- referral to Reintal specialist Exam  by Monna Fam   (09/01/2008)   Diabetic eye exam action/deferral: Deferred  (01/24/2009)   Eye exam due: 12/2008    Foot exam: yes  (07/19/2008)   High risk foot: No  (07/19/2008)   Foot care education: Done  (07/19/2008)    Urine microalbumin/creatinine ratio: 45.5  (12/22/2008)   Urine microalbumin action/deferral: Deferred  Lipids   Total Cholesterol: 162  (03/24/2009)   Lipid panel action/deferral: Deferred   LDL: 90  (03/24/2009)   LDL Direct: 247.6  (06/18/2007)   HDL: 46  (03/24/2009)   Triglycerides: 131  (03/24/2009)    SGOT (AST): 19  (03/24/2009)   BMP action: Deferred   SGPT (ALT): 19  (03/24/2009)   Alkaline phosphatase: 101  (  03/24/2009)   Total bilirubin: 0.3  (03/24/2009)  Hypertension   Last Blood Pressure: 144 / 82  (02/22/2010)   Serum creatinine: 1.41  (03/24/2009)   BMP action: Deferred   Serum potassium 4.1  (03/24/2009)  Self-Management Support :   Personal Goals (by the next clinic visit) :     Personal A1C goal: 7  (03/15/2009)     Personal blood pressure goal: 140/90  (03/15/2009)     Personal LDL  goal: 70  (03/15/2009)    Diabetes self-management support: Written self-care plan  (03/15/2009)   Last diabetes self-management training by diabetes educator: 03/23/2009   Referred for diabetes self-mgmt training.   Last medical nutrition therapy: 05/13/2008    Hypertension self-management support: Written self-care plan  (03/15/2009)    Lipid self-management support: Written self-care plan  (03/15/2009)    Nursing Instructions: Give tetanus booster today

## 2010-07-06 NOTE — Assessment & Plan Note (Signed)
Summary: yearly/sl  Medications Added FISH OIL 1200 MG CAPS (OMEGA-3 FATTY ACIDS) Taking two tablet once a day FUROSEMIDE 80 MG TABS (FUROSEMIDE) once daily      Allergies Added: NKDA  Visit Type:  1 year follow up Primary Provider:  Rosalia Hammers MD  CC:  no complaints.  History of Present Illness:   [name] is seen in followup for a now resolved nonischemic myopathy thought attributalle to a left atrial tachycardia.   He also has multiple cardiac risk factors including diabetes and hypertension and morbid obesity.  efforts at weight loss has been complicated by difficulties with his ankle; His ankle is much better at this point.  Review of recent outpatient clinic records demonstrated that an attempt to increase his ACE inhibitor resulted in significant increase in his creatinine from 1.22---2.3 range. This has successfully normalized with the discontinuation of both his diuretic and his ACE inhibitor.    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 Take 1 Tablet By Mouth Once A Day 2)  Carvedilol 25 Mg  Tabs (Carvedilol) .... Take 1 Tablet By Mouth Two Times A Day 3)  Cialis 10 Mg  Tabs (Tadalafil) .... Take 1 Tablet By Mouth Once A Day As Needed For Sexual Activity 4)  Bd Insulin Syringe 29g X 1/2" 1 Ml  Misc (Insulin Syringe-Needle U-100) .... Use As Directed 5)   Onetouch Ultra Test  Strp (Glucose Blood) .... Check Blood Sugars Before Each Meal 6)  Onetouch Ultrasoft Lancets  Misc (Lancets) .... Use To Test Your Blood Sugar Before Each Meal Each Day 7)  Metformin Hcl 1000 Mg Tabs (Metformin Hcl) .... Take 1 Tablet By Mouth Two Times A Day. 8)  Tramadol Hcl 50 Mg Tabs (Tramadol Hcl) .... Take 1 Pill By Mouth Three Times A Day As Needed For Pain. 9)  Onetouch Ultra Mini W/device Kit (Blood Glucose Monitoring Suppl) .... Use To Check Blood Sugar Durng Day At Work Before and After Lunch 10)  Fish Oil 1200 Mg Caps (Omega-3 Fatty Acids) .... Taking Two Tablet Once A Day 11)  Osteo Bi-Flex Regular Strength 250-200 Mg Tabs (Glucosamine-Chondroitin) .... Taking 4 Caplets Per Day With Food. 12)  Lantus 100 Unit/ml Soln (Insulin Glargine) .... Take 110 Units Once A Day in The Morning.  You Can Split 55 Units and 55 Units and Inject It At Two Locations. 13)  Pravastatin Sodium 40 Mg Tabs (Pravastatin Sodium) .... Take One Tablet By Mouth Once A Day in The Evening.  Allergies (verified): No Known Drug Allergies  Past History:  Past Medical History: Last updated: 02/22/2010 Diabetes mellitus, type II:  proteinuria     eye exam:  Dr. Elonda Husky Sept 18     Proteinuria:  micro/al:  113 Jul 06 Hypertension GERD Hyperlipidemia Paroxysmal atrial tachycardia:  Awaiting results of ambulatory heart monitor from Dr. Caryl Comes (also wearing life vest):   Cardiomyopathy, Nonischemic, tachycardia related:  Cath Dec 2007-normal; EF-20-25% ED  Past Surgical History: Last updated: 02/17/2008 Unremarkable  Family History: Last updated: 02/17/2008 Family History of Diabetes: Mother & Brother  Social History: Last updated: 02/17/2008 Occupation: Production assistant, radio Patient has never smoked.  Alcohol Use - no Illicit Drug Use - no  Risk Factors: Caffeine Use: tea (04/10/2010) Exercise: no (04/10/2010)  Risk Factors: Smoking Status: never (04/10/2010)  Vital  Signs:  Patient profile:   55 year old male Height:      71 inches Weight:      293.50 pounds BMI:     41.08 Pulse rate:   66 / minute BP sitting:   170 / 84  (left arm) Cuff size:   large  Vitals Entered By: Hansel Feinstein CMA (April 13, 2010 12:02 PM)  Physical Exam  General:  The patient was alert and oriented in no acute distress'; morbidly obese HEENT Normal.   Lungs were clear.  Heart sounds were regular without murmurs or gallops.  Abdomen was soft with active bowel sounds. There is no clubbing cyanosis ; 2+ edema Skin Warm and dry    EKG  Procedure date:  04/13/2010  Findings:      sinus rhythm at 61 Intervals 0.15/0.09/0.40 Left ventricular hypertrophy-borderline with modest repolarization abnormalities  Impression & Recommendations:  Problem # 1:  RENAL FAILURE, ACUTE (ICD-584.9) His creatinine has improved per the outpatient clinic records. I will resume his furosemide 80 mg a day and he has significant fluid overload;  When he returns to the outpatient clinic as scheduled in the next week or so, if his creatinine remains stable, I would ask that they resume his lisinopril as was their written intention  Problem # 2:  CARDIOMYOPATHY (ICD-425.4) We will need to reassess his left ventricular function by echo given the interlude. I M. concerned regarding the possibility of recurrent cardiomyopathy His updated medication list for this problem includes:    Aspirin 81 Mg Tbec (Aspirin) .Marland Kitchen... Take 1 tablet by mouth once a day    Carvedilol 25 Mg Tabs (Carvedilol) .Marland Kitchen... Take 1 tablet by mouth two times a day    Furosemide 80 Mg Tabs (Furosemide) ..... Once daily  Orders: Event (Event)  Problem # 3:  HYPERTENSION (ICD-401.9)  His blood pressure is poorly controlled. Resuming his diuretic I think it is important. We also discussed salt in his diet. Further dictation this would be very helpful. His updated medication list for this problem includes:    Aspirin 81 Mg  Tbec (Aspirin) .Marland Kitchen... Take 1 tablet by mouth once a day    Carvedilol 25 Mg Tabs (Carvedilol) .Marland Kitchen... Take 1 tablet by mouth two times a day    Furosemide 80 Mg Tabs (Furosemide) ..... Once daily  His updated medication list for this problem includes:    Aspirin 81 Mg Tbec (Aspirin) .Marland Kitchen... Take 1 tablet by mouth once a day    Carvedilol 25 Mg Tabs (Carvedilol) .Marland Kitchen... Take 1 tablet by mouth two times a day    Furosemide 80 Mg Tabs (Furosemide) ..... Once daily  Problem # 4:  FATIGUE (ICD-780.79) Given his significant obesity and hypertension, I think it is incumbent to exclude sleep apnea. We will arrange a night watch study.  Other Orders: Echocardiogram (Echo)  Patient Instructions: 1)  Your physician has recommended you make the following change in your medication: Furosemide 80mg  daily 2)  Your physician wants you to follow-up in:  1 year You will receive a reminder letter in the mail two months  in advance. If you don't receive a letter, please call our office to schedule the follow-up appointment. 3)  Your physician has recommended that you wear an event monitor-Nightwatch.  Event monitors are medical devices that record the heart's electrical activity. Doctors most often use these monitors to diagnose arrhythmias. Arrhythmias are problems with the speed or rhythm of the heartbeat. The monitor is a small, portable device. You can wear one while you do your normal daily activities. This is usually used to diagnose what is causing palpitations/syncope (passing out). 4)  Your physician has requested that you have an echocardiogram.  Echocardiography is a painless test that uses sound waves to create images of your heart. It provides your doctor with information about the size and shape of your heart and how well your heart's chambers and valves are working.  This procedure takes approximately one hour. There are no restrictions for this procedure. Prescriptions: FUROSEMIDE 80 MG TABS (FUROSEMIDE)  once daily  #30 x 11   Entered by:   Joan Flores RN   Authorized by:   Nikki Dom, MD, [facility]   Signed by:   Joan Flores RN on 04/13/2010   Method used:   Electronically to        [facility] (retail)       [address]       [place], [place]  [address]       Ph: [phone]       Fax: [phone]   RxID:   [id number]

## 2010-07-06 NOTE — Letter (Signed)
Summary: BLOOD GLUCOSE 02-02-2010/03-17-2010  BLOOD GLUCOSE 02-02-2010/03-17-2010   Imported By: Garlan Fillers 03/20/2010 15:07:55  _____________________________________________________________________  External Attachment:    Type:   Image     Comment:   External Document

## 2010-07-06 NOTE — Miscellaneous (Signed)
Summary: Change in medication  Clinical Lists Changes   Medications: Changed medication from LISINOPRIL 5 MG TABS (LISINOPRIL) Take one tablet by mouth once a day. to LISINOPRIL 20 MG TABS (LISINOPRIL) Take one tablet by mouth once a day. - Signed Rx of LISINOPRIL 20 MG TABS (LISINOPRIL) Take one tablet by mouth once a day.;  #30 x 2;  Signed;  Entered by: Rosalia Hammers MD;  Authorized by: Rosalia Hammers MD;  Method used: Electronically to [facility] [address], [place], [place], [place]  [address], Ph: [phone], Fax: [phone]    Prescriptions: LISINOPRIL 20 MG TABS (LISINOPRIL) Take one tablet by mouth once a day.  #30 x 2   Entered and Authorized by:   Rosalia Hammers MD   Signed by:   Rosalia Hammers MD on 03/18/2010   Method used:   Electronically to        [facility] (retail)       [address]       [place], [place]  [address]       Ph: [phone]       Fax: [phone]   RxID:   [id number]

## 2010-07-06 NOTE — Assessment & Plan Note (Signed)
Summary: ACUTE/ILLATH/1 MONTH F/U VISIT/CH   Vital Signs:  Patient profile:   55 year old male Height:      71 inches (180.34 cm) Weight:      290.9 pounds (132.23 kg) BMI:     40.72 Temp:     98.2 degrees F (36.78 degrees C) oral Pulse rate:   78 / minute BP sitting:   135 / 84  (right arm) Cuff size:   regular  Vitals Entered By: Lucky Rathke NT II (April 21, 2010 3:35 PM) CC: FOLOW UP FROM LAST OFFICE VISIT / ? BLOOD WORK  /  HAVING SURGERY ON DEC 5. 012 [facility](LEFT EYE) Is Patient Diabetic? Yes Did you bring your meter with you today? Yes Pain Assessment Patient in pain? yes     Location: RIGHT ANKLE Intensity:       5 Type: STIFFNESS Onset of pain  Chronic Nutritional Status BMI of > 30 = obese  Have you ever been in a relationship where you felt threatened, hurt or afraid?No   Does patient need assistance? Functional Status Self care Ambulation Normal  Vision Screening:       Vision Entered By: Lucky Rathke NT II (April 21, 2010 3:44 PM)   Primary Care Provider:  Rosalia Hammers MD  CC:  FOLOW UP FROM LAST OFFICE VISIT / ? BLOOD WORK  /  HAVING SURGERY ON DEC 5. 012 [facility](LEFT EYE).  History of Present Illness: Pt is a 55 year old male with h/o  DM, AKI, HTN, HLD and Cardiomyopathy who came here for check lab results. He has acute renal failure of Cr 2.36 from 1.2 early this month after increasing Lisinopril from 5 mg to 20 mg.  a weel ago BMET was checked and Cr back to his baseline of Cr 1.2. He has no other c/o, including CP, SOB, fever, abdominal pain. No smoking, melena.   Preventive Screening-Counseling & Management  Alcohol-Tobacco     Alcohol type: very seldom     Smoking Status: never  Caffeine-Diet-Exercise     Caffeine use/day: tea     Does Patient Exercise: no     Type of exercise: walks on the job  Problems Prior to Update: 1)  Fatigue  (ICD-780.79) 2)  Renal Failure, Acute  (ICD-584.9) 3)  Obesity   (ICD-278.00) 4)  Paroxysmal Atrial Tachycardia  (ICD-427.0) 5)  Cardiomyopathy  (ICD-425.4) 6)  Degenerative Joint Disease, Advanced  (ICD-715.90) 7)  Ankle Pain, Right  (ICD-719.47) 8)  Joint Effusion, Left Knee  (ICD-719.06) 9)  Special Screening For Malignant Neoplasms Colon  (ICD-V76.51) 10)  Diabetic Retinopathy  (ICD-250.50) 11)  Nephropathy, Diabetic  (ICD-250.40) 12)  Hyperlipidemia  (ICD-272.4) 75)  Gerd  (ICD-530.81) 14)  Hypertension  (ICD-401.9) 15)  Diabetes Mellitus, Type II  (ICD-250.00)  Medications Prior to Update: 1)  Aspirin 81 Mg Tbec (Aspirin) .... Take 1 Tablet By Mouth Once A Day 2)  Carvedilol 25 Mg  Tabs (Carvedilol) .... Take 1 Tablet By Mouth Two Times A Day 3)  Cialis 10 Mg  Tabs (Tadalafil) .... Take 1 Tablet By Mouth Once A Day As Needed For Sexual Activity 4)  Bd Insulin Syringe 29g X 1/2" 1 Ml  Misc (Insulin Syringe-Needle U-100) .... Use As Directed 5)  Onetouch Ultra Test  Strp (Glucose Blood) .... Check Blood Sugars Before Each Meal 6)  Onetouch Ultrasoft Lancets  Misc (Lancets) .... Use To Test Your Blood Sugar Before Each Meal Each Day 7)  Metformin Hcl 1000  Mg Tabs (Metformin Hcl) .... Take 1 Tablet By Mouth Two Times A Day. 8)  Tramadol Hcl 50 Mg Tabs (Tramadol Hcl) .... Take 1 Pill By Mouth Three Times A Day As Needed For Pain. 9)  Onetouch Ultra Mini W/device Kit (Blood Glucose Monitoring Suppl) .... Use To Check Blood Sugar Durng Day At Work Before and After Lunch 10)  Fish Oil 1200 Mg Caps (Omega-3 Fatty Acids) .... Taking Two Tablet Once A Day 11)  Osteo Bi-Flex Regular Strength 250-200 Mg Tabs (Glucosamine-Chondroitin) .... Taking 4 Caplets Per Day With Food. 12)  Lantus 100 Unit/ml Soln (Insulin Glargine) .... Take 110 Units Once A Day in The Morning.  You Can Split 55 Units and 55 Units and Inject It At Two Locations. 13)  Pravastatin Sodium 40 Mg Tabs (Pravastatin Sodium) .... Take One Tablet By Mouth Once A Day in The Evening. 14)   Furosemide 80 Mg Tabs (Furosemide) .... Once Daily  Current Medications (verified): 1)  Aspirin 81 Mg Tbec (Aspirin) .... Take 1 Tablet By Mouth Once A Day 2)  Carvedilol 25 Mg  Tabs (Carvedilol) .... Take 1 Tablet By Mouth Two Times A Day 3)  Cialis 10 Mg  Tabs (Tadalafil) .... Take 1 Tablet By Mouth Once A Day As Needed For Sexual Activity 4)  Bd Insulin Syringe 29g X 1/2" 1 Ml  Misc (Insulin Syringe-Needle U-100) .... Use As Directed 5)  Onetouch Ultra Test  Strp (Glucose Blood) .... Check Blood Sugars Before Each Meal 6)  Onetouch Ultrasoft Lancets  Misc (Lancets) .... Use To Test Your Blood Sugar Before Each Meal Each Day 7)  Metformin Hcl 1000 Mg Tabs (Metformin Hcl) .... Take 1 Tablet By Mouth Two Times A Day. 8)  Tramadol Hcl 50 Mg Tabs (Tramadol Hcl) .... Take 1 Pill By Mouth Three Times A Day As Needed For Pain. 9)  Onetouch Ultra Mini W/device Kit (Blood Glucose Monitoring Suppl) .... Use To Check Blood Sugar Durng Day At Work Before and After Lunch 10)  Fish Oil 1200 Mg Caps (Omega-3 Fatty Acids) .... Taking Two Tablet Once A Day 11)  Osteo Bi-Flex Regular Strength 250-200 Mg Tabs (Glucosamine-Chondroitin) .... Taking 4 Caplets Per Day With Food. 12)  Lantus 100 Unit/ml Soln (Insulin Glargine) .... Take 110 Units Once A Day in The Morning.  You Can Split 55 Units and 55 Units and Inject It At Two Locations. 13)  Pravastatin Sodium 40 Mg Tabs (Pravastatin Sodium) .... Take One Tablet By Mouth Once A Day in The Evening. 14)  Furosemide 80 Mg Tabs (Furosemide) .... Once Daily  Allergies (verified): No Known Drug Allergies  Past History:  Past Medical History: Last updated: 02/22/2010 Diabetes mellitus, type II:  proteinuria     eye exam:  Dr. Elonda Husky Sept 18     Proteinuria:  micro/al:  113 Jul 06 Hypertension GERD Hyperlipidemia Paroxysmal atrial tachycardia:  Awaiting results of ambulatory heart monitor from Dr. Caryl Comes (also wearing life vest):   Cardiomyopathy, Nonischemic,  tachycardia related:  Cath Dec 2007-normal; EF-20-25% ED  Past Surgical History: Last updated: 02/17/2008 Unremarkable  Social History: Last updated: 02/17/2008 Occupation: Production assistant, radio Patient has never smoked.  Alcohol Use - no Illicit Drug Use - no  Risk Factors: Smoking Status: never (04/21/2010)  Family History: Reviewed history from 02/17/2008 and no changes required. Family History of Diabetes: Mother & Brother  Social History: Reviewed history from 02/17/2008 and no changes required. 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  Alcohol Use - no  Illicit Drug Use - no  Review of Systems  The patient denies fever, decreased hearing, syncope, dyspnea on exertion, peripheral edema, prolonged cough, abdominal pain, and melena.    Physical Exam  General:  alert, well-developed, well-nourished, well-hydrated, and overweight-appearing.   Nose:  no nasal discharge.   Mouth:  pharynx pink and moist.   Neck:  supple.   Lungs:  normal respiratory effort, normal breath sounds, no crackles, and no wheezes.   Heart:  normal rate, regular rhythm, and no gallop.   Abdomen:  soft, non-tender, normal bowel sounds, and no distention.   Msk:  normal ROM, no joint tenderness, and no joint swelling.   Pulses:  2+ Extremities:  No edema.  Neurologic:  alert & oriented X3 and gait normal.     Impression & Recommendations:  Problem # 1:  RENAL FAILURE, ACUTE (ICD-584.9) Assessment Improved Since his Cr has been back to his baseline and recently lasix was restarted by his cardiologist. So will recheck BMET. If Cr still at his baseline, may restart lisinopril at 5 mg.   Labs Reviewed: BUN: 16 (04/10/2010)   Cr: 1.22 (04/10/2010)    Hgb: 14.3 (02/22/2010)   Hct: 43.8 (02/22/2010)   Ca++: 9.6 (04/10/2010)    TP: 6.2 (04/04/2010)   Alb: 3.7 (04/04/2010)  Orders: T-Basic Metabolic Panel (99991111)  Problem # 2:  HYPERTENSION (ICD-401.9) Assessment:  Improved BP well controlled. Continue current meds. Recheck at next visit.  His updated medication list for this problem includes:    Carvedilol 25 Mg Tabs (Carvedilol) .Marland Kitchen... Take 1 tablet by mouth two times a day    Furosemide 80 Mg Tabs (Furosemide) ..... Once daily  BP today: 135/84 Prior BP: 170/84 (04/13/2010)  Labs Reviewed: K+: 4.3 (04/10/2010) Creat: : 1.22 (04/10/2010)   Chol: 341 (02/22/2010)   HDL: 45 (02/22/2010)   LDL: 257 (02/22/2010)   TG: 195 (02/22/2010)  Problem # 3:  DIABETES MELLITUS, TYPE II (ICD-250.00) Assessment: Unchanged His CBG well controlled and runs 100s. So will continue current regimen.  His updated medication list for this problem includes:    Aspirin 81 Mg Tbec (Aspirin) .Marland Kitchen... Take 1 tablet by mouth once a day    Metformin Hcl 1000 Mg Tabs (Metformin hcl) .Marland Kitchen... Take 1 tablet by mouth two times a day.    Lantus 100 Unit/ml Soln (Insulin glargine) .Marland Kitchen... Take 110 units once a day in the morning.  you can split 55 units and 55 units and inject it at two locations.  Labs Reviewed: Creat: 1.22 (04/10/2010)     Last Eye Exam: Proliferative diabetic retinopathy.   Severe OU- referral to Reintal specialist Exam  by Monna Fam  (09/01/2008) Reviewed HgBA1c results: 8.6 (02/20/2010)  9.6 (03/15/2009)  Problem # 4:  OBESITY (ICD-278.00) Assessment: Comment Only Advised weight loss and exercise.  Ht: 71 (04/21/2010)   Wt: 290.9 (04/21/2010)   BMI: 40.72 (04/21/2010)  Complete Medication List: 1)  Aspirin 81 Mg Tbec (Aspirin) .... Take 1 tablet by mouth once a day 2)  Carvedilol 25 Mg Tabs (Carvedilol) .... Take 1 tablet by mouth two times a day 3)  Cialis 10 Mg Tabs (Tadalafil) .... Take 1 tablet by mouth once a day as needed for sexual activity 4)  Bd Insulin Syringe 29g X 1/2" 1 Ml Misc (Insulin syringe-needle u-100) .... Use as directed 5)  Onetouch Ultra Test Strp (Glucose blood) .... Check blood sugars before each meal 6)  Onetouch Ultrasoft  Lancets Misc (Lancets) .... Use to test your  blood sugar before each meal each day 7)  Metformin Hcl 1000 Mg Tabs (Metformin hcl) .... Take 1 tablet by mouth two times a day. 8)  Tramadol Hcl 50 Mg Tabs (Tramadol hcl) .... Take 1 pill by mouth three times a day as needed for pain. 9)  Onetouch Ultra Mini W/device Kit (Blood glucose monitoring suppl) .... Use to check blood sugar durng day at work before and after lunch 10)  Fish Oil 1200 Mg Caps (Omega-3 fatty acids) .... Taking two tablet once a day 11)  Osteo Bi-flex Regular Strength 250-200 Mg Tabs (Glucosamine-chondroitin) .... Taking 4 caplets per day with food. 12)  Lantus 100 Unit/ml Soln (Insulin glargine) .... Take 110 units once a day in the morning.  you can split 55 units and 55 units and inject it at two locations. 13)  Pravastatin Sodium 40 Mg Tabs (Pravastatin sodium) .... Take one tablet by mouth once a day in the evening. 14)  Furosemide 80 Mg Tabs (Furosemide) .... Once daily  Patient Instructions: 1)  Please schedule a follow-up appointment in 2 weeks. 2)  Will call you if any abnormal labs.  3)  It is important that you exercise regularly at least 20 minutes 5 times a week. If you develop chest pain, have severe difficulty breathing, or feel very tired , stop exercising immediately and seek medical attention. 4)  You need to lose weight. Consider a lower calorie diet and regular exercise.    Orders Added: 1)  T-Basic Metabolic Panel 0000000 2)  Est. Patient Level III [id number]   Immunization History:  Influenza Immunization History:    Influenza:  historical (03/20/2010)   Immunization History:  Influenza Immunization History:    Influenza:  Historical (03/20/2010) Process Orders Check Orders Results:     [facility]: ABN not required for this insurance Tests Sent for requisitioning (April 21, 2010 10:21 PM):     04/21/2010: [facility] -- T-Basic Metabolic Panel  0000000 (signed)     Prevention & Chronic Care Immunizations   Influenza vaccine: Historical  (03/20/2010)   Influenza vaccine deferral: Deferred  (02/22/2010)    Tetanus booster: Not documented    Pneumococcal vaccine: Not documented   Pneumococcal vaccine deferral: Not indicated  (02/20/2010)  Colorectal Screening   Hemoccult: Negative  (12/29/2004)    Colonoscopy: Location:  [place].    (02/26/2008)   Colonoscopy due: 03/2013  Other Screening   PSA: Not documented  Reports requested:  Smoking status: never  (04/21/2010)  Diabetes Mellitus   HgbA1C: 8.6  (02/20/2010)   HgbA1C action/deferral: Deferred  (01/24/2009)    Eye exam: Proliferative diabetic retinopathy.   Severe OU- referral to Reintal specialist Exam  by Monna Fam   (09/01/2008)   Last eye exam report requested.   Diabetic eye exam action/deferral: Deferred  (01/24/2009)   Eye exam due: 12/2008    Foot exam: yes  (02/20/2010)   Foot exam action/deferral: Do today   High risk foot: No  (02/20/2010)   Foot care education: Done  (02/20/2010)   Foot exam due: 08/24/2010    Urine microalbumin/creatinine ratio: 1192.6  (02/22/2010)   Urine microalbumin action/deferral: Ordered    Diabetes flowsheet reviewed?: Yes   Progress toward A1C goal: Improved  Lipids   Total Cholesterol: 341  (02/22/2010)   Lipid panel action/deferral: Deferred   LDL: 257  (02/22/2010)   LDL Direct: 247.6  (06/18/2007)   HDL: 45  (02/22/2010)   Triglycerides: 195  (02/22/2010)  SGOT (AST): 12  (04/04/2010)   BMP action: Deferred   SGPT (ALT): 14  (04/04/2010)   Alkaline phosphatase: 91  (04/04/2010)   Total bilirubin: 0.3  (04/04/2010)    Lipid flowsheet reviewed?: Yes   Progress toward LDL goal: Deteriorated  Hypertension   Last Blood Pressure: 135 / 84  (04/21/2010)   Serum creatinine: 1.22  (04/10/2010)   BMP action: Deferred   Serum potassium 4.3  (04/10/2010)    Hypertension  flowsheet reviewed?: Yes   Progress toward BP goal: Improved  Self-Management Support :   Personal Goals (by the next clinic visit) :     Personal A1C goal: 7  (03/15/2009)     Personal blood pressure goal: 140/90  (03/15/2009)     Personal LDL goal: 70  (03/15/2009)    Patient will work on the following items until the next clinic visit to reach self-care goals:     Medications and monitoring: take my medicines every day, check my blood sugar, examine my feet every day  (04/21/2010)     Eating: drink diet soda or water instead of juice or soda, eat more vegetables, use fresh or frozen vegetables, eat foods that are low in salt, eat baked foods instead of fried foods, eat fruit for snacks and desserts, limit or avoid alcohol  (04/21/2010)     Activity: take a 30 minute walk every day  (04/21/2010)     Other: walks a lot "on the job" - wants to start using stationary bike  (01/24/2009)    Diabetes self-management support: Resources for patients handout  (04/21/2010)   Last diabetes self-management training by diabetes educator: 03/23/2009   Last medical nutrition therapy: 05/13/2008    Hypertension self-management support: Resources for patients handout  (04/21/2010)    Lipid self-management support: Resources for patients handout  (04/21/2010)        Resource handout printed.   Nursing Instructions: Request report of last diabetic eye exam    Last LDL:                                                 257 (02/22/2010 7:38:00 PM)        Diabetic Foot Exam Last Podiatry Exam Date: 02/20/2010    10-g (5.07) Semmes-Weinstein Monofilament Test Performed by: Lucky Rathke NT II          Right Foot          Left Foot Visual Inspection     normal           normal

## 2010-07-06 NOTE — Letter (Signed)
Summary: ONE TOUCH  ONE TOUCH   Imported By: Garlan Fillers 06/12/2010 11:10:00  _____________________________________________________________________  External Attachment:    Type:   Image     Comment:   External Document

## 2010-07-06 NOTE — Progress Notes (Signed)
----   Converted from flag ---- ---- 03/20/2010 4:31 PM, Morrison Old RN wrote: Pt. was called and informed of lab work and instructed to increase Lisinopril to 20mg  per Dr. Newt Lukes.  ---- 03/18/2010 4:48 PM, Rosalia Hammers MD wrote: please call this patient and let him know that his lab work was normal and he needs to increase his Lisinopril to 20 mg one tablet by mouth once a day. He can complete his current Medication ( which was 5 mg). I send a prescription to [facility] for 20 mg.  Thank you. ------------------------------

## 2010-07-06 NOTE — Miscellaneous (Signed)
  Clinical Lists Changes   Orders: Added new Service order of Est. Patient Level III [id number]) - Signed

## 2010-07-06 NOTE — Progress Notes (Signed)
Summary: [name] APPT LIST  Phone Note Outgoing Call   Call placed by: Enedina Finner,  December 27, 2009 9:50 AM Call placed to: Patient Details for Reason: Appt List for [name] Summary of Call: Called patient and left message on answering machine for patient to call the clinic and sch an appointment with PCP for 6 month Check Up. Initial call taken by: Enedina Finner,  December 27, 2009 9:52 AM

## 2010-07-06 NOTE — Miscellaneous (Signed)
Summary: Lab  Clinical Lists Changes  Problems: Added new problem of RENAL FAILURE, ACUTE (ICD-584.9) - Patient was started on Lisinopril on 10/14. Crea incrased from 1.24 to 2.36. Orders: Added new Test order of T-Basic Metabolic Panel (99991111) - Signed

## 2010-07-06 NOTE — Letter (Signed)
Summary: ONE TOUCH  ONE TOUCH   Imported By: Garlan Fillers 05/23/2010 13:37:13  _____________________________________________________________________  External Attachment:    Type:   Image     Comment:   External Document

## 2010-07-06 NOTE — Assessment & Plan Note (Signed)
Summary: EST-3 MONTH F/U VISIT/CH   Vital Signs:  Patient profile:   55 year old male Height:      71 inches Weight:      290.7 pounds BMI:     40.69 Temp:     97.5 degrees F oral Pulse rate:   62 / minute BP sitting:   182 / 92  (right arm)  Vitals Entered By: Silverio Decamp NT II (June 05, 2010 3:20 PM) CC: followupvisit Is Patient Diabetic? Yes Did you bring your meter with you today? No Pain Assessment Patient in pain? no      Nutritional Status BMI of > 30 = obese  Have you ever been in a relationship where you felt threatened, hurt or afraid?No   Does patient need assistance? Functional Status Self care Ambulation Normal   Primary Care Provider:  Rosalia Hammers MD  CC:  followupvisit.  History of Present Illness: This is a 55 year old Male with PMH significant for DM, HTN, HLD, paroxysmal atrial tachycardia, Cardiomyopathy (2 D Echo (04/14/10 by Dr Caryl Comes) EF 55 % Hypokinesis, mild LVH   who is here for a regular  follow up visit.   1. HTN: 150/80: Lisinopril 20, Lasix 80, Coreg 25 Pt denies any chest pain, SOB, headache. Tolerating medication well  2. DM Hgb A1c 9,2<- 8.6 Lantus 110, Metforim 1000 two times a day noted that he is taking his medication on a regular basis.  Review of Glucometer: highest 357, mean 126, lowest 54 ( pt was symptomatic )  3. HLD: Chol 247, Trigl 204, HDL 50, LDL 156,  currently on Pravastatin 40 since 02/2010 pt noted he has been taking the medication.      Preventive Screening-Counseling & Management  Alcohol-Tobacco     Alcohol type: very seldom     Smoking Status: never  Caffeine-Diet-Exercise     Caffeine use/day: tea     Does Patient Exercise: no  Current Medications (verified): 1)  Aspirin 81 Mg Tbec (Aspirin) .... Take 1 Tablet By Mouth Once A Day 2)  Carvedilol 25 Mg  Tabs (Carvedilol) .... Take 1 Tablet By Mouth Two Times A Day 3)  Cialis 10 Mg  Tabs (Tadalafil) .... Take 1 Tablet By Mouth Once A Day As Needed  For Sexual Activity 4)  Bd Insulin Syringe 29g X 1/2" 1 Ml  Misc (Insulin Syringe-Needle U-100) .... Use As Directed 5)  Onetouch Ultra Test  Strp (Glucose Blood) .... Check Blood Sugars Before Each Meal 6)  Onetouch Ultrasoft Lancets  Misc (Lancets) .... Use To Test Your Blood Sugar Before Each Meal Each Day 7)  Metformin Hcl 1000 Mg Tabs (Metformin Hcl) .... Take 1 Tablet By Mouth Two Times A Day. 8)  Tramadol Hcl 50 Mg Tabs (Tramadol Hcl) .... Take 1 Pill By Mouth Three Times A Day As Needed For Pain. 9)  Onetouch Ultra Mini W/device Kit (Blood Glucose Monitoring Suppl) .... Use To Check Blood Sugar Durng Day At Work Before and After Lunch 10)  Fish Oil 1200 Mg Caps (Omega-3 Fatty Acids) .... Taking Two Tablet Once A Day 11)  Osteo Bi-Flex Regular Strength 250-200 Mg Tabs (Glucosamine-Chondroitin) .... Taking 4 Caplets Per Day With Food. 12)  Lantus 100 Unit/ml Soln (Insulin Glargine) .... Take 110 Units Once A Day in The Morning.  You Can Split 55 Units and 55 Units and Inject It At Two Locations. 13)  Pravastatin Sodium 40 Mg Tabs (Pravastatin Sodium) .... Take One Tablet By Mouth Once  A Day in The Evening. 14)  Furosemide 80 Mg Tabs (Furosemide) .... Once Daily 15)  Lisinopril 20 Mg Tabs (Lisinopril) .... Take One Tablet By Mouth Daily  Allergies: No Known Drug Allergies  Review of Systems  The patient denies fever, weight loss, weight gain, vision loss, chest pain, syncope, peripheral edema, abdominal pain, and muscle weakness.    Physical Exam  General:  Well-developed, in no acute distress, alert,appropriate and cooperative throughout examination Lungs:  Normal respiratory effort, chest expands symmetrically. Lungs are clear to auscultation, no crackles or wheezes. Heart:  Normal rate and regular rhythm. S1 and S2 normal without gallop, murmur, click, rub or other extra sounds. Abdomen:  Bowel sounds positive,abdomen soft and non-tender without masses, organomegaly or hernias  noted. Extremities:  No clubbing, cyanosis, edema, or deformity noted with normal full range of motion of all joints.     Impression & Recommendations:  Problem # 1:  HYPERTENSION (ICD-401.9) BP today 150/80 with  Lisinopril 20, Lasix 80, Coreg 25 Pt denies any chest pain, SOB, headache. Tolerating medication well. At this point I will not change any of her medication since Lisinopril was just increased on 12/27 from 10 mg to 20 mg. And Lasix was restarted after the episode of ARF in October.  I will check a Bmet in 2-3 weeks since pt had episode of ARF after increasing Lisinopril from 5 mg to 20 mg in October.  May consider to evaluate for possible renal stenosis. Will discuss with Dr Caryl Comes if that was ever done.   His updated medication list for this problem includes:    Carvedilol 25 Mg Tabs (Carvedilol) .Marland Kitchen... Take 1 tablet by mouth two times a day    Furosemide 80 Mg Tabs (Furosemide) ..... Once daily    Lisinopril 20 Mg Tabs (Lisinopril) .Marland Kitchen... Take one tablet by mouth daily  Future Orders: T-Basic Metabolic Panel (99991111) ... 06/23/2010  Problem # 2:  DIABETES MELLITUS, TYPE II (ICD-250.00)  Hgb A1c 9.2 on 12/2  compared to 8.6 in 02/2010. Currently on  Lantus 110 and  Metforim 1000 two times a day Pt noted that he has been taking his medication on a regular basis. Review of Glucometer: highest 357, mean 126, lowest 54 ( pt was symptomatic ). Reviewing the chart it pt has a  hx of medical noncompliance. Pt just started to take his Lantus mid of November and on 12/2 Lantus was increased to 110 from 100. At this point I am not making any changes in management especially since pt has episoded of low blood glucose as low as 54 ( although just once ) which most likely indicates that he maximized on his current Lantus regimen. I will recheck Hgb in 2 month and make changes accordingly. At that time may consider meal coverage at least once a day since pt had problems with compliance in the  past. Once a day is better then nothing. Furhtermore may consider  Novolog 70/30 two times a day since this will provide some kind of meal coverage but again pt had a hx of noncompliance and he was therefore changed to Lantus.    His updated medication list for this problem includes:    Aspirin 81 Mg Tbec (Aspirin) .Marland Kitchen... Take 1 tablet by mouth once a day    Metformin Hcl 1000 Mg Tabs (Metformin hcl) .Marland Kitchen... Take 1 tablet by mouth two times a day.    Lantus 100 Unit/ml Soln (Insulin glargine) .Marland Kitchen... Take 110 units once a day in  the morning.  you can split 55 units and 55 units and inject it at two locations.    Lisinopril 20 Mg Tabs (Lisinopril) .Marland Kitchen... Take one tablet by mouth daily  Problem # 3:  HYPERLIPIDEMIA (ICD-272.4) Chol 247, Trigl 204, HDL 50, LDL 156 which has improved compared to FLP in September but pt is not at goal. I will increase the dosag of Pravastatin to 80 mg. Will recheck LFT in 6 weeks.    His updated medication list for this problem includes:    Pravastatin Sodium 80 Mg Tabs (Pravastatin sodium) .Marland Kitchen... Take one tablet by mouth once a day in the evening.  Problem # 4:  CARDIOMYOPATHY (ICD-425.4) Followed by Dr Caryl Comes.  2- D Echo (04/14/10 )showed an  EF 55 %, mild  Hypokinesis, mild LVH   Complete Medication List: 1)  Aspirin 81 Mg Tbec (Aspirin) .... Take 1 tablet by mouth once a day 2)  Carvedilol 25 Mg Tabs (Carvedilol) .... Take 1 tablet by mouth two times a day 3)  Cialis 10 Mg Tabs (Tadalafil) .... Take 1 tablet by mouth once a day as needed for sexual activity 4)  Bd Insulin Syringe 29g X 1/2" 1 Ml Misc (Insulin syringe-needle u-100) .... Use as directed 5)  Onetouch Ultra Test Strp (Glucose blood) .... Check blood sugars before each meal 6)  Onetouch Ultrasoft Lancets Misc (Lancets) .... Use to test your blood sugar before each meal each day 7)  Metformin Hcl 1000 Mg Tabs (Metformin hcl) .... Take 1 tablet by mouth two times a day. 8)  Tramadol Hcl 50 Mg Tabs  (Tramadol hcl) .... Take 1 pill by mouth three times a day as needed for pain. 9)  Onetouch Ultra Mini W/device Kit (Blood glucose monitoring suppl) .... Use to check blood sugar durng day at work before and after lunch 10)  Fish Oil 1200 Mg Caps (Omega-3 fatty acids) .... Taking two tablet once a day 11)  Osteo Bi-flex Regular Strength 250-200 Mg Tabs (Glucosamine-chondroitin) .... Taking 4 caplets per day with food. 12)  Lantus 100 Unit/ml Soln (Insulin glargine) .... Take 110 units once a day in the morning.  you can split 55 units and 55 units and inject it at two locations. 13)  Pravastatin Sodium 80 Mg Tabs (Pravastatin sodium) .... Take one tablet by mouth once a day in the evening. 14)  Furosemide 80 Mg Tabs (Furosemide) .... Once daily 15)  Lisinopril 20 Mg Tabs (Lisinopril) .... Take one tablet by mouth daily  Patient Instructions: 1)  Please schedule an appointment with your primary doctor in  end of February.  2)  Please schedule an appointment for lab work Artist) in 3 weeks. : 3)  It is important that you exercise regularly at least 20 minutes 5 times a week. If you develop chest pain, have severe difficulty breathing, or feel very tired , stop exercising immediately and seek medical attention. 4)  You need to lose weight. Consider a lower calorie diet and regular exercise.  Prescriptions: PRAVASTATIN SODIUM 80 MG TABS (PRAVASTATIN SODIUM) Take one tablet by mouth once a day in the evening.  #30 x 3   Entered and Authorized by:   Rosalia Hammers MD   Signed by:   Rosalia Hammers MD on 06/05/2010   Method used:   Electronically to        [facility] [address] (retail)       [place]  [place], [place]  [address]       Ph: [phone]       Fax: [phone]   RxID:   [id number]    Orders Added: 1)  T-Basic Metabolic Panel 0000000 2)  Est. Patient Level IV [id number]    Prevention & Chronic Care Immunizations   Influenza vaccine:  Historical  (03/20/2010)   Influenza vaccine deferral: Deferred  (02/22/2010)   Influenza vaccine due: 02/03/2011    Tetanus booster: Not documented   Td booster deferral: Refused  (06/05/2010)   Tetanus booster due: 05/30/2020    Pneumococcal vaccine: Not documented   Pneumococcal vaccine deferral: Not indicated  (02/20/2010)   Pneumococcal vaccine due: 07/12/2018    Immunization comments: refused due to financial issues  Colorectal Screening   Hemoccult: Negative  (12/29/2004)    Colonoscopy: Location:  [facility].    (02/26/2008)   Colonoscopy due: 03/2013  Other Screening   PSA: Not documented   PSA action/deferral: Discussed-decision deferred  (05/05/2010)   Smoking status: never  (06/05/2010)  Diabetes Mellitus   HgbA1C: 9.2  (05/05/2010)   HgbA1C action/deferral: Deferred  (01/24/2009)   Hemoglobin A1C due: 08/04/2010    Eye exam: Proliferative diabetic retinopathy.   Severe OU- referral to Reintal specialist Exam  by Monna Fam   (09/01/2008)   Diabetic eye exam action/deferral: Not indicated  (06/05/2010)   Eye exam due: 12/2008    Foot exam: yes  (05/05/2010)   Foot exam action/deferral: Do today   High risk foot: Yes  (05/05/2010)   Foot care education: Done  (05/05/2010)   Foot exam due: 08/29/2010    Urine microalbumin/creatinine ratio: 1192.6  (02/22/2010)   Urine microalbumin action/deferral: Ordered   Urine microalbumin/cr due: 02/23/2011    Diabetes flowsheet reviewed?: Yes   Progress toward A1C goal: Deteriorated  Lipids   Total Cholesterol: 247  (05/30/2010)   Lipid panel action/deferral: Lipid Panel ordered   LDL: 156  (05/30/2010)   LDL Direct: 247.6  (06/18/2007)   HDL: 50  (05/30/2010)   Triglycerides: 204  (05/30/2010)   Lipid panel due: 11/29/2010    SGOT (AST): 12  (04/04/2010)   BMP action: Deferred   SGPT (ALT): 14  (04/04/2010)   Alkaline phosphatase: 91  (04/04/2010)   Total bilirubin: 0.3   (04/04/2010)   Liver panel due: 04/05/2011    Lipid flowsheet reviewed?: Yes   Progress toward LDL goal: Improved  Hypertension   Last Blood Pressure: 182 / 92  (06/05/2010)   Serum creatinine: 1.14  (05/30/2010)   BMP action: Deferred   Serum potassium 4.4  ([id number])   Basic metabolic panel due: 123XX123    Hypertension flowsheet reviewed?: Yes   Progress toward BP goal: Unchanged  Self-Management Support :   Personal Goals (by the next clinic visit) :     Personal A1C goal: 7  (03/15/2009)     Personal blood pressure goal: 140/90  (03/15/2009)     Personal LDL goal: 70  (03/15/2009)    Diabetes self-management support: Resources for patients handout  (05/30/2010)   Last diabetes self-management training by diabetes educator: 03/23/2009   Last medical nutrition therapy: 05/13/2008    Hypertension self-management support: Resources for patients handout  (05/30/2010)    Lipid self-management support: Resources for patients handout  (05/30/2010)

## 2010-07-06 NOTE — Assessment & Plan Note (Signed)
Summary: ACUTE/ILLATH/PER ISAMAH/1 WEEK RECHECK/CH   Vital Signs:  Patient profile:   55 year old male Height:      71 inches (180.34 cm) Weight:      290.8 pounds (132.18 kg) BMI:     40.70 Temp:     98.6 degrees F (37.00 degrees C) oral Pulse rate:   67 / minute BP sitting:   158 / 94  (right arm) Cuff size:   large  Vitals Entered By: Lucky Rathke NT II (May 30, 2010 3:03 PM) CC: FOLLOW UP APPT / DOSEN'T KNOW EXACTLY [place] / DM Is Patient Diabetic? Yes Did you bring your meter with you today? Yes Pain Assessment Patient in pain? no      Nutritional Status BMI of > 30 = obese CBG Result 93  Have you ever been in a relationship where you felt threatened, hurt or afraid?No   Does patient need assistance? Functional Status Self care   Primary Care Provider:  Rosalia Hammers MD  CC:  FOLLOW UP APPT / DOSEN'T KNOW EXACTLY [place] / DM.  History of Present Illness: Follow up on AKI, presumably due to Lasix and lisinopril use. Patient was restarted on both --with Lisinopril  being titrated gradually up per Dr. Glendon Axe OV note. Patient denies any side-effects. Feels "frustrated" because "each time sees a different doctor and has to change doses of his medications."  Preventive Screening-Counseling & Management  Alcohol-Tobacco     Alcohol type: very seldom     Smoking Status: never  Caffeine-Diet-Exercise     Caffeine use/day: tea     Does Patient Exercise: no     Type of exercise: walks on the job  Problems Prior to Update: 1)  Fatigue  (ICD-780.79) 2)  Hypertension  (ICD-401.9) 3)  Diabetes Mellitus, Type II  (ICD-250.00) 4)  Diabetic Retinopathy  (ICD-250.50) 5)  Nephropathy, Diabetic  (ICD-250.40) 6)  Renal Failure, Acute  (ICD-584.9) 7)  Ankle Pain, Right  (ICD-719.47) 8)  Joint Effusion, Left Knee  (ICD-719.06) 9)  Special Screening For Malignant Neoplasms Colon  (ICD-V76.51) 10)  Obesity  (ICD-278.00) 11)  Paroxysmal Atrial  Tachycardia  (ICD-427.0) 12)  Cardiomyopathy  (ICD-425.4) 13)  Degenerative Joint Disease, Advanced  (ICD-715.90) 14)  Hyperlipidemia  (ICD-272.4) 15)  Gerd  (ICD-530.81)  Current Problems (verified): 1)  Hypertension  (ICD-401.9) 2)  Diabetes Mellitus, Type II  (ICD-250.00) 3)  Diabetic Retinopathy  (ICD-250.50) 4)  Nephropathy, Diabetic  (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 Take 1 Tablet By Mouth Once A Day 2)  Carvedilol 25 Mg  Tabs (Carvedilol) .... Take 1 Tablet By Mouth Two Times A Day 3)  Cialis 10 Mg  Tabs (Tadalafil) .... Take 1 Tablet By Mouth Once A Day As Needed For Sexual Activity 4)  Bd Insulin Syringe 29g X 1/2" 1 Ml  Misc (Insulin Syringe-Needle U-100) .... Use As Directed 5)  Onetouch Ultra Test  Strp (Glucose Blood) .... Check Blood Sugars Before Each Meal 6)  Onetouch Ultrasoft Lancets  Misc (Lancets) .... Use To Test Your Blood Sugar Before Each Meal Each Day 7)  Metformin Hcl 1000 Mg Tabs (Metformin Hcl) .... Take 1 Tablet By Mouth Two  Times A Day. 8)  Tramadol Hcl 50 Mg Tabs (Tramadol Hcl) .... Take 1 Pill By Mouth Three Times A Day As Needed For Pain. 9)  Onetouch Ultra Mini W/device Kit (Blood Glucose Monitoring Suppl) .... Use To Check Blood Sugar Durng Day At Work Before and After Lunch 10)  Fish Oil 1200 Mg Caps (Omega-3 Fatty Acids) .... Taking Two Tablet Once A Day 11)  Osteo Bi-Flex Regular Strength 250-200 Mg Tabs (Glucosamine-Chondroitin) .... Taking 4 Caplets Per Day With Food. 12)  Lantus 100 Unit/ml Soln (Insulin Glargine) .... Take 110 Units Once A Day in The Morning.  You Can Split 55 Units and 55 Units and Inject It At Two Locations. 13)  Pravastatin  Sodium 40 Mg Tabs (Pravastatin Sodium) .... Take One Tablet By Mouth Once A Day in The Evening. 14)  Furosemide 80 Mg Tabs (Furosemide) .... Once Daily 15)  Lisinopril 5 Mg Tabs (Lisinopril) .... Take 2 Tablets By Mouth Once A Day For Blood Pressure  Current Medications (verified): 1)  Aspirin 81 Mg Tbec (Aspirin) .... Take 1 Tablet By Mouth Once A Day 2)  Carvedilol 25 Mg  Tabs (Carvedilol) .... Take 1 Tablet By Mouth Two Times A Day 3)  Cialis 10 Mg  Tabs (Tadalafil) .... Take 1 Tablet By Mouth Once A Day As Needed For Sexual Activity 4)  Bd Insulin Syringe 29g X 1/2" 1 Ml  Misc (Insulin Syringe-Needle U-100) .... Use As Directed 5)  Onetouch Ultra Test  Strp (Glucose Blood) .... Check Blood Sugars Before Each Meal 6)  Onetouch Ultrasoft Lancets  Misc (Lancets) .... Use To Test Your Blood Sugar Before Each Meal Each Day 7)  Metformin Hcl 1000 Mg Tabs (Metformin Hcl) .... Take 1 Tablet By Mouth Two Times A Day. 8)  Tramadol Hcl 50 Mg Tabs (Tramadol Hcl) .... Take 1 Pill By Mouth Three Times A Day As Needed For Pain. 9)  Onetouch Ultra Mini W/device Kit (Blood Glucose Monitoring Suppl) .... Use To Check Blood Sugar Durng Day At Work Before and After Lunch 10)  Fish Oil 1200 Mg Caps (Omega-3 Fatty Acids) .... Taking Two Tablet Once A Day 11)  Osteo Bi-Flex Regular Strength 250-200 Mg Tabs (Glucosamine-Chondroitin) .... Taking 4 Caplets Per Day With Food. 12)  Lantus 100 Unit/ml Soln (Insulin Glargine) .... Take 110 Units Once A Day in The Morning.  You Can Split 55 Units and 55 Units and Inject It At Two Locations. 13)  Pravastatin Sodium 40 Mg Tabs (Pravastatin Sodium) .... Take One Tablet By Mouth Once A Day in The Evening. 14)  Furosemide 80 Mg Tabs (Furosemide) .... Once Daily 15)  Lisinopril 5 Mg Tabs (Lisinopril) .... Take 2 Tablets By Mouth Once A Day For Blood Pressure  Allergies (verified): No Known Drug Allergies  Past History:  Past Medical History: Last updated:  02/22/2010 Diabetes mellitus, type II:  proteinuria     eye exam:  Dr. Elonda Husky Sept 18     Proteinuria:  micro/al:  113 Jul 06 Hypertension GERD Hyperlipidemia Paroxysmal atrial tachycardia:  Awaiting results of ambulatory heart monitor from Dr. Caryl Comes (also wearing life vest):   Cardiomyopathy, Nonischemic, tachycardia related:  Cath Dec 2007-normal; EF-20-25% ED  Past Surgical History: Last updated: 02/17/2008 Unremarkable  Family History: Last updated: 02/17/2008 Family History of Diabetes: Mother & Brother  Social History: Last updated: 02/17/2008 Occupation: Production assistant, radio Patient has never smoked.  Alcohol Use - no Illicit Drug Use - no  Risk Factors: Caffeine Use: tea (05/30/2010) Exercise:  no (05/30/2010)  Risk Factors: Smoking Status: never (05/30/2010)  Physical Exam  General:  alert, well-developed, and well-hydrated.   Head:  normocephalic and atraumatic.   Eyes:  No corneal or conjunctival inflammation noted. EOMI. Perrla. Nose:  no nasal discharge.   Mouth:  pharynx pink and moist.   Neck:  supple.   Lungs:  normal respiratory effort and normal breath sounds.   Heart:  normal rate, regular rhythm, no murmur, and no gallop.   Abdomen:  obese, non-tender.   Msk:  normal ROM, no joint tenderness, and no joint swelling.   Pulses:  normal peripheral pulses Extremities:  no edema or cyanosis Neurologic:  alert & oriented X3.   Skin:  Intact without suspicious lesions or rashes Psych:  Cognition and judgment appear intact. Alert and cooperative with normal attention span and concentration. No apparent delusions, illusions, hallucinations   Impression & Recommendations:  Problem # 1:  RENAL FAILURE, ACUTE (ICD-584.9) Assessment Improved  Hx of AKI while on Furosemide and Lisinopril in October of 2011. Subsequently, was restarted on both --> with Lisinopril  that for an unkown reason, being gradually titrated up (due to a recent ? AKI) -> seems to be an  unusual approach. will discuss with Dr. Hilma Favors (attending).  Last Creatinine WNL while on Lisinopril 10 mg Po daily.Patient's BP is still suboptimally controlled and would most likely benefit from a maximum dose of Lisnopril of 40 mg daily due to microlabuminuria and concomittant DM -> patient refused.  Agreed tol increase Lisinopril up to 20 mg by mouth daily today and recheck B-met in 4 weeks. Side-effects of ACEI reviewed. Rationale for ACEI use explained. May consider nondihydropyridines for microalbuminuria. Orders: T-Basic Metabolic Panel ([id number])  Labs Reviewed: BUN: 27 (05/19/2010)   Cr: 1.28 (05/19/2010)    Hgb: 14.3 (02/22/2010)   Hct: 43.8 (02/22/2010)   Ca++: 9.7 (05/19/2010)    TP: 6.2 (04/04/2010)   Alb: 3.7 (04/04/2010)  Problem # 2:  DIABETES MELLITUS, TYPE II (ICD-250.00) Assessment: Deteriorated Patient is noncompliant with his diet and exercise regimens. Risks of DM reviewed with the patient. Referred for a DME with Ms. Ovid Curd and leave up to his PCP Dr. Newt Lukes to address his insulin regimen next visit in 4 weeks. His updated medication list for this problem includes:    Aspirin 81 Mg Tbec (Aspirin) .Marland Kitchen... Take 1 tablet by mouth once a day    Metformin Hcl 1000 Mg Tabs (Metformin hcl) .Marland Kitchen... Take 1 tablet by mouth two times a day.    Lantus 100 Unit/ml Soln (Insulin glargine) .Marland Kitchen... Take 110 units once a day in the morning.  you can split 55 units and 55 units and inject it at two locations.    Lisinopril 20 Mg Tabs (Lisinopril) .Marland Kitchen... Take one tablet by mouth daily  Orders: Capillary Blood Glucose/CBG [id number]) DME Referral (DME)  Labs Reviewed: Creat: 1.28 (05/19/2010)     Last Eye Exam: Proliferative diabetic retinopathy.   Severe OU- referral to Reintal specialist Exam  by Monna Fam  (09/01/2008) Reviewed HgBA1c results: 9.2 (05/05/2010)  8.6 (02/20/2010)  Complete Medication List: 1)  Aspirin 81 Mg Tbec (Aspirin) .... Take 1 tablet by mouth once a day 2)   Carvedilol 25 Mg Tabs (Carvedilol) .... Take 1 tablet by mouth two times a day 3)  Cialis 10 Mg Tabs (Tadalafil) .... Take 1 tablet by mouth once a day as needed for sexual activity 4)  Bd Insulin Syringe 29g X 1/2" 1 Ml Misc (Insulin syringe-needle u-100) .Marland KitchenMarland KitchenMarland Kitchen  Use as directed 5)  Onetouch Ultra Test Strp (Glucose blood) .... Check blood sugars before each meal 6)  Onetouch Ultrasoft Lancets Misc (Lancets) .... Use to test your blood sugar before each meal each day 7)  Metformin Hcl 1000 Mg Tabs (Metformin hcl) .... Take 1 tablet by mouth two times a day. 8)  Tramadol Hcl 50 Mg Tabs (Tramadol hcl) .... Take 1 pill by mouth three times a day as needed for pain. 9)  Onetouch Ultra Mini W/device Kit (Blood glucose monitoring suppl) .... Use to check blood sugar durng day at work before and after lunch 10)  Fish Oil 1200 Mg Caps (Omega-3 fatty acids) .... Taking two tablet once a day 11)  Osteo Bi-flex Regular Strength 250-200 Mg Tabs (Glucosamine-chondroitin) .... Taking 4 caplets per day with food. 12)  Lantus 100 Unit/ml Soln (Insulin glargine) .... Take 110 units once a day in the morning.  you can split 55 units and 55 units and inject it at two locations. 13)  Pravastatin Sodium 40 Mg Tabs (Pravastatin sodium) .... Take one tablet by mouth once a day in the evening. 14)  Furosemide 80 Mg Tabs (Furosemide) .... Once daily 15)  Lisinopril 20 Mg Tabs (Lisinopril) .... Take one tablet by mouth daily  Other Orders: Ophthalmology Referral (Ophthalmology) T-Lipid Profile [id number])  Patient Instructions: 1)  Please, take all your medications as prescribed. 2)  please, call with any questions and follow up with Dr. Newt Lukes in 4-12 weeks or sooner. Dr. Newt Lukes is usually seeing patients every Monday afternoons. Prescriptions: LISINOPRIL 20 MG TABS (LISINOPRIL) Take one tablet by mouth daily  #30 x 11   Entered and Authorized by:   Milana Obey MD   Signed by:   Milana Obey MD on  05/30/2010   Method used:   Electronically to        [facility] (retail)       [address]       [place], [place]  [address]       Ph: [phone]       Fax: [phone]   RxID:   [id number] LISINOPRIL 40 MG TABS (LISINOPRIL) Take one tablet by mouth daily for blood pressure and kidney protection  #30 x 11   Entered and Authorized by:   Milana Obey MD   Signed by:   Milana Obey MD on 05/30/2010   Method used:   Electronically to        [facility] (retail)       [address]       [place], [place]  [address]       Ph: [phone]       Fax: [phone]   RxID:   [id number]    Orders Added: 1)  Ophthalmology Referral [Ophthalmology] 2)  T-Lipid Profile [id number] 3)  Capillary Blood Glucose/CBG [[id number]] 4)  Est. Patient Level IV [id number] 5)  DME Referral [DME] 6)  T-Basic Metabolic Panel 0000000    Prevention & Chronic Care Immunizations   Influenza vaccine: Historical  (03/20/2010)   Influenza vaccine deferral: Deferred  (02/22/2010)   Influenza vaccine due: 02/03/2011    Tetanus booster: Not documented   Tetanus booster due: 05/30/2020    Pneumococcal vaccine: Not documented   Pneumococcal vaccine deferral: Not indicated  (02/20/2010)   Pneumococcal vaccine due: 07/12/2018  Colorectal Screening   Hemoccult: Negative  (12/29/2004)  Colonoscopy: Location:  [facility].    (02/26/2008)   Colonoscopy due: 03/2013  Other Screening   PSA: Not documented   PSA action/deferral: Discussed-decision deferred  (05/05/2010)   Smoking status: never  (05/30/2010)  Diabetes Mellitus   HgbA1C: 9.2  (05/05/2010)   HgbA1C action/deferral: Deferred  (01/24/2009)   Hemoglobin A1C due: 08/04/2010    Eye exam: Proliferative diabetic retinopathy.   Severe OU- referral to Reintal specialist Exam  by Monna Fam   (09/01/2008)   Diabetic eye exam action/deferral:  Ophthalmology referral  (05/30/2010)   Eye exam due: 12/2008    Foot exam: yes  (05/05/2010)   Foot exam action/deferral: Do today   High risk foot: Yes  (05/05/2010)   Foot care education: Done  (05/05/2010)   Foot exam due: 08/29/2010    Urine microalbumin/creatinine ratio: 1192.6  (02/22/2010)   Urine microalbumin action/deferral: Ordered   Urine microalbumin/cr due: 02/23/2011    Diabetes flowsheet reviewed?: Yes   Progress toward A1C goal: Unchanged    Stage of readiness to change (diabetes management): Action  Lipids   Total Cholesterol: 341  (02/22/2010)   Lipid panel action/deferral: Lipid Panel ordered   LDL: 257  (02/22/2010)   LDL Direct: 247.6  (06/18/2007)   HDL: 45  (02/22/2010)   Triglycerides: 195  (02/22/2010)   Lipid panel due: 11/29/2010    SGOT (AST): 12  (04/04/2010)   BMP action: Deferred   SGPT (ALT): 14  (04/04/2010)   Alkaline phosphatase: 91  (04/04/2010)   Total bilirubin: 0.3  (04/04/2010)   Liver panel due: 04/05/2011    Lipid flowsheet reviewed?: Yes   Progress toward LDL goal: Unchanged  Hypertension   Last Blood Pressure: 158 / 94  (05/30/2010)   Serum creatinine: 1.28  (05/19/2010)   BMP action: Deferred   Serum potassium 4.3  (99991111)   Basic metabolic panel due: 123XX123    Hypertension flowsheet reviewed?: Yes   Progress toward BP goal: Unchanged    Stage of readiness to change (hypertension management): Action  Self-Management Support :   Personal Goals (by the next clinic visit) :     Personal A1C goal: 7  (03/15/2009)     Personal blood pressure goal: 140/90  (03/15/2009)     Personal LDL goal: 70  (03/15/2009)    Patient will work on the following items until the next clinic visit to reach self-care goals:     Medications and monitoring: take my medicines every day, check my blood sugar, examine my feet every day  (05/30/2010)     Eating: drink diet soda or water instead of juice or soda, eat more vegetables, use  fresh or frozen vegetables, eat foods that are low in salt, eat baked foods instead of fried foods, eat fruit for snacks and desserts, limit or avoid alcohol  (05/30/2010)     Activity: take a 30 minute walk every day  (05/30/2010)     Other: walks a lot "on the job" - wants to start using stationary bike  (01/24/2009)    Diabetes self-management support: Resources for patients handout  (05/30/2010)   Last diabetes self-management training by diabetes educator: 03/23/2009   Last medical nutrition therapy: 05/13/2008    Hypertension self-management support: Resources for patients handout  (05/30/2010)    Lipid self-management support: Resources for patients handout  (05/30/2010)        Resource handout printed.   Nursing Instructions: Give tetanus booster today Give Pneumovax today Refer for screening diabetic eye exam (  see order)   Process Orders Check Orders Results:     [facility]: Order checked:     Milana Obey MD NOT AUTHORIZED TO ORDER Tests Sent for requisitioning (May 31, 2010 1:11 PM):     05/30/2010: [facility] -- T-Lipid Profile [phone] (signed)     05/30/2010: [facility] -- T-Basic Metabolic Panel 0000000 (signed)   [facility] call for clarification on dose of lisinopril.   I talked with Dr Stanford Scotland and pt refused to take lisinopril 40 mg at this time so  he was given lisinopril 20 mg . Plarmacy informed. Gevena Cotton RN  May 31, 2010 9:29 AM

## 2010-07-31 ENCOUNTER — Ambulatory Visit (INDEPENDENT_AMBULATORY_CARE_PROVIDER_SITE_OTHER): Payer: 59 | Admitting: Dietician

## 2010-07-31 ENCOUNTER — Ambulatory Visit (INDEPENDENT_AMBULATORY_CARE_PROVIDER_SITE_OTHER): Payer: 59 | Admitting: Internal Medicine

## 2010-07-31 ENCOUNTER — Encounter: Payer: Self-pay | Admitting: Internal Medicine

## 2010-07-31 DIAGNOSIS — E1139 Type 2 diabetes mellitus with other diabetic ophthalmic complication: Secondary | ICD-10-CM

## 2010-07-31 DIAGNOSIS — E119 Type 2 diabetes mellitus without complications: Secondary | ICD-10-CM

## 2010-07-31 DIAGNOSIS — I10 Essential (primary) hypertension: Secondary | ICD-10-CM

## 2010-07-31 MED ORDER — GLUCOSE BLOOD VI STRP: ORAL_STRIP | Status: DC

## 2010-07-31 MED ORDER — TRAMADOL HCL 50 MG PO TABS: 50.0000 mg | ORAL_TABLET | Freq: Three times a day (TID) | ORAL | Status: DC | PRN

## 2010-07-31 NOTE — Assessment & Plan Note (Signed)
Blood glucose fairly well controlled. Will not change any meds today. Motivated for more regular exercise for wt loss and eventually better DM and health control.

## 2010-07-31 NOTE — Progress Notes (Signed)
  Subjective:    Patient ID: [name], male    DOB: Nov 24, 1955, 55 y.o.   MRN: [id number]  HPI [name] is a 55 yo man with PMH of DM2, HTN who comes to the clinic for a follow up appointment and med refill. He has no significant complaints as of today.  He He denies any chest pain, SOB, headaches, abd pain, urinary abnormalities.    Review of Systems  Constitutional: Negative for fever, chills, diaphoresis, activity change, appetite change and fatigue.  HENT: Negative.   Eyes: Negative.   Respiratory: Negative.   Cardiovascular: Negative.   Gastrointestinal: Negative.   Genitourinary: Negative.   Neurological: Negative.   Psychiatric/Behavioral: Negative.        Objective:   Physical Exam  Constitutional: He is oriented to person, place, and time. He appears well-developed and well-nourished.  HENT:  Head: Normocephalic and atraumatic.  Eyes: Conjunctivae and EOM are normal. Pupils are equal, round, and reactive to light. Right eye exhibits no discharge. Left eye exhibits no discharge.  Neck: Normal range of motion. Neck supple. No JVD present.  Cardiovascular: Normal rate and regular rhythm.  Exam reveals no gallop and no friction rub.   No murmur heard. Pulmonary/Chest: Effort normal and breath sounds normal. No respiratory distress. He has no wheezes. He exhibits no tenderness.  Abdominal: Soft. Bowel sounds are normal. He exhibits no distension. There is no tenderness.  Musculoskeletal: Normal range of motion.       R ankle brace in place for arthritis and 1+ swelling of R leg.  Neurological: He is alert and oriented to person, place, and time. No cranial nerve deficit.  Skin: Skin is warm and dry. No erythema.          Assessment & Plan:

## 2010-07-31 NOTE — Assessment & Plan Note (Signed)
BP 157/75, mildly elevated SBP. Will continue current regimen as seems to be motivated for wt loss and exercise.

## 2010-07-31 NOTE — Patient Instructions (Signed)
Please make a follow up appointment in 3 months. Please try to follow more close exercise schedule for better control of your Diabetes and overall health. Please take all your medicines regularly.

## 2010-07-31 NOTE — Progress Notes (Signed)
Blood sugar meter downloaded and revealed 100% of checks done in am, range from 73-253.71% > 140 mg/dl and no hypoglycemia. Patient checked his after lunch (5 wings & lemonade- not sure if sugared or not)  reading in office today with result of 196 mg/dl. Discussed high A1C In December. Patient does not verbalize any needs for diabetes self management training or Medical nutrition therapy today even with discussion of his high LDL cholesterol and need to attain tight blood sugar control to prevent further risk to his eyes. He was late to his appointment, so we had limited time today as well.  Suggested follow up with CDE if patient started on meal time insulin. A1C due 08/04/10. Will tryto coordinate care with his PCP.

## 2010-08-14 LAB — GLUCOSE, CAPILLARY: Glucose-Capillary: 195 mg/dL — ABNORMAL HIGH (ref 70–99)

## 2010-08-15 LAB — GLUCOSE, CAPILLARY: Glucose-Capillary: 210 mg/dL — ABNORMAL HIGH (ref 70–99)

## 2010-08-17 LAB — GLUCOSE, CAPILLARY: Glucose-Capillary: 347 mg/dL — ABNORMAL HIGH (ref 70–99)

## 2010-08-27 ENCOUNTER — Other Ambulatory Visit: Payer: Self-pay | Admitting: Internal Medicine

## 2010-08-27 DIAGNOSIS — E119 Type 2 diabetes mellitus without complications: Secondary | ICD-10-CM

## 2010-09-09 LAB — GLUCOSE, CAPILLARY: Glucose-Capillary: 150 mg/dL — ABNORMAL HIGH (ref 70–99)

## 2010-09-11 ENCOUNTER — Telehealth: Payer: Self-pay | Admitting: Dietician

## 2010-09-11 NOTE — Telephone Encounter (Signed)
Dr. Newt Lukes ordered a lab. Called patient to let him know that it was ordered and he should be hearing form [place] with a lab appintment.

## 2010-09-14 LAB — GLUCOSE, CAPILLARY: Glucose-Capillary: 155 mg/dL — ABNORMAL HIGH (ref 70–99)

## 2010-09-14 NOTE — Telephone Encounter (Signed)
Note patient scheduled and notified of appointment for A1C.

## 2010-09-18 ENCOUNTER — Other Ambulatory Visit (INDEPENDENT_AMBULATORY_CARE_PROVIDER_SITE_OTHER): Payer: 59

## 2010-09-18 DIAGNOSIS — E119 Type 2 diabetes mellitus without complications: Secondary | ICD-10-CM

## 2010-09-18 LAB — GLUCOSE, CAPILLARY: Glucose-Capillary: 220 mg/dL — ABNORMAL HIGH (ref 70–99)

## 2010-09-18 NOTE — Progress Notes (Signed)
Addended by: Maryan Rued on: 09/18/2010 08:46 AM   Modules accepted: Orders

## 2010-10-17 NOTE — Op Note (Signed)
NAME:  DEMONT, [name]                ACCOUNT NO.:  [id number]   MEDICAL RECORD NO.:  [id number]          PATIENT TYPE:  INP   LOCATION:  3018                         FACILITY:  [facility]   PHYSICIAN:  Lenn Cal, D.D.S.DATE OF BIRTH:  03-Aug-1955   DATE OF PROCEDURE:  04/21/2008  DATE OF DISCHARGE:                               OPERATIVE REPORT   PREOPERATIVE DIAGNOSES:  1. Right facial swelling.  2. Status post incision and drainage and drain placement by Dr.      Erik Obey on April 20, 2008.  3. Apical periodontitis.  4. Periapical abscess  5. Multiple retained root segments.   POSTOPERATIVE DIAGNOSES:  1. Right facial swelling.  2. Status post incision and drainage and drain placement by Dr.      Erik Obey on April 20, 2008.  3. Apical periodontitis.  4. Periapical abscess  5. Multiple retained root segments.   OPERATION:  Extraction of tooth numbers 3, 14, and 30 with  alveoloplasty.   SURGEON:  Lenn Cal, DDS   ASSISTANT:  Arnoldo Hooker (dental assistant).   ANESTHESIA:  General anesthesia via oral endotracheal tube.   MEDICATIONS:  1. IV antibiotic therapy per previous orders.  2. Local anesthesia with a total utilization of 3 carpules each      containing 34 mg of lidocaine with 0.017 mg of epinephrine as well      as 1 carpule containing 9 mg of bupivacaine with 0.009 mg of      epinephrine.   SPECIMENS:  There were 3 teeth that were discarded.   DRAINS:  None placed.  Previous drain from Dr. Erik Obey in the area #3  was left in as is.  Dr. Erik Obey is to remove as indicated later.   ESTIMATED BLOOD LOSS:  Less than 25 mL.   FLUIDS:  300 mL of normal saline solution.   COMPLICATIONS:  None.   INDICATIONS:  The patient was recently admitted with a history of right  facial swelling.  The patient was examined to provide dental treatment  as indicated and to rule out dental etiology for the facial swelling.  The patient was examined and  treatment planned for multiple extraction  of tooth numbers 3, 14, and 30, and others as indicated with  alveoloplasty.  Tooth #3 appeared to be the tooth that was causing the  facial swelling.  Buccal swelling was noted, and ENT was contacted for  possible incision and drainage procedure prior to the dental medicine  procedure on April 21, 2008.  The incision and drainage procedure was  performed by Dr. Erik Obey as per his note in the chart on April 20, 2008.  Please note, during the dental consultation, periapical abscess  in area #3 was noted along with chronic periodontitis, multiple retained  root segments, and the presence of chronic apical periodontitis.  The  aforementioned necessitated removal of tooth numbers 3, 14, and 30 with  alveoloplasty as indicated.   DESCRIPTION OF PROCEDURE:  The patient was brought to main operating  room #2.  The patient was then placed in supine position upon the  operating room table.  General anesthesia was then induced via an oral  endotracheal tube.  The patient was then prepped and draped in usual  manner for dental medicine procedure.  A time-out was performed.  The  patient was identified and procedures verified.  A throat pack was  placed at this time.  The oral cavity was then thoroughly examined with  the findings noted above.  The patient was then ready with a dental  medicine procedure as follows:   Local anesthesia was administered with a total utilization of 3 carpules  each containing 34 mg of lidocaine with 0.017 mg of epinephrine as well  as one carpule containing 9 mg of bupivacaine with 0.009 mg of  epinephrine.   The maxillary right and left quadrants were first approached.  Anesthesia was delivered via infiltration utilizing the lidocaine with  epinephrine.  The mandibular right quadrant was then approached.  The  patient was given an inferior alveolar nerve block and long buccal nerve  block utilizing the bupivacaine  with epinephrine.  Further infiltration  was then achieved in the area of #30 with lidocaine with epinephrine.   At this point in time, the maxillary right quadrant was approached.  Tooth #3 was then subluxated with a series of straight elevators.  Tooth  #3 was then removed with a 53R forceps without further complications.  Minor alveoloplasty was then performed utilizing rongeurs and bone file.  The surgical site was then irrigated with copious amounts of sterile  saline.  The Penrose drain was then evaluated and no further of  purulence was noted to come from either the extraction socket #3 or the  Penrose drain.  The Penrose drain was left as is to allow for future  drainage as indicated and Dr. Erik Obey will remove this as indicated.  The extraction site area #3 was not cloased with sutures to allow  drainage of this surgical site.   The maxillary left quadrant was then approached in the area of #14.  Tooth number 14, retained root segments were then subluxated with a  series of straight elevators.  Tooth #14 was then removed with a  rongeurs and bone file without further complications.  This was achieved  utilizing the removal of the separate root segments present in the  mouth.  Alveoplasty was then performed utilizing rongeurs and bone file.  The surgical site was then irrigated with copious amounts of sterile  saline.  The tissues were approximated and trimmed appropriately.  Surgical site was then closed from the distal #15 and extended to the  mesial of #13 utilizing 3-0 chromic gut suture in a continuous  interrupted suture technique x1.   At this point in time, the mandibular right quadrant was approached.  A  15-blade incision was then made from the distal of #30 and extended to  the mesial of #30 buccal flap was then reflected.  Appropriate amounts  of buccal and interseptal bone was removed around this tooth.  Tooth #30  was then removed with a 23 forceps without further  complications.  Alveoplasty was then performed utilizing rongeurs and bone file.  The  surgical site was then irrigated with copious amounts of sterile saline.  The surgical site was then closed from the distal #30 and extended to  the mesial of #30 utilizing 3-0 chromic gut suture in a continuous  interrupted suture technique x1.   At this point in time, the entire mouth was irrigated with copious  amounts of sterile  saline.  The patient was examined for complications,  seeing none, dental medicine procedure was deemed to be complete.  The  throat pack was removed at this time.  The patient was then handed over  to the anesthesia team for final disposition.  After appropriate amount  of time, the patient was extubated and taken to the [facility] with stable vital  signs, good oxygenation level.  All counts were correct for dental  medicine procedure.  The patient will be seen in approximately 1 week  for evaluation for suture removal.  In the meantime, the patient is to  continue IV antibiotic therapy and will then be discharge per Internal  Medicine team as indicated.      Lenn Cal, D.D.S.  Electronically Signed     RFK/MEDQ  D:  04/21/2008  T:  04/22/2008  Job:  [id number]   cc:   Ileene Hutchinson T. Erik Obey, M.D.

## 2010-10-17 NOTE — Discharge Summary (Signed)
NAME:  [name], [name] NO.:  [id number]   MEDICAL RECORD NO.:  [id number]         PATIENT TYPE:  INP   LOCATION:  [id number]                         FACILITY:  [facility]   PHYSICIAN:  Evette Doffing, M.D.  DATE OF BIRTH:  08/07/1955   DATE OF ADMISSION:  04/19/2008  DATE OF DISCHARGE:  04/26/2008                               DISCHARGE SUMMARY   CONTINUITY DOCTORS:  Flo Shanks, MD and  Erik Obey, MD.   DISCHARGE DIAGNOSES:  1. Facial abscess secondary to tooth abscess and periapical abscess.  2. Acute renal failure secondary to acute tubular necrosis versus      interstitial nephritis.  3. Left knee effusion likely secondary to inflammatory arthritis.  4. Cardiomyopathy with a 2-D echo in March 2008 that showed mild      dilation of the left ventricle and systolic function at lower limit      of normal.  5. Diabetes type 2 with a hemoglobin A1c at 12.0.  6. Hypertension.  7. Hyperlipidemia.  8. Gastroesophageal reflux disease.  9. Diabetic nephropathy.  10.Diabetic retinopathy.  11.Paroxysmal atrial tachycardia.   DISCHARGE MEDICATIONS:  1. Aspirin 81 mg by mouth daily.  2. Glucosamine/chondroitin by mouth twice a day.  3. Coreg 25 mg by mouth daily.  4. Humulin 30 units inject twice a day.  5. Humulin R 10 units twice a day with meals.  6. Prilosec 40 mg by mouth daily.  7. Clindamycin 300 mg by mouth every 6 hours for 7 more days.  8. Ultram 50 mg 1 tablet twice a day for pain.  9. Tylenol 325 mg by mouth every 6 hours for pain as needed.   Medications that were stopped during this admission were lisinopril,  Motrin, and Lasix.   DISPOSITION AND FOLLOWUP:  [name] has an appointment at the  [facility] on May 04, 2008, at 3 p.m.  During that  appointment, we need to check his creatinine to make sure that his acute  renal failure has improved.  We also need to control his diabetes and  increase his insulin dose.  His insulin dose  was decreased because the  patient was eating less due to the right facial abscess.  Also, the  resolution of the left knee effusion needs to be followed up. labs that  need to be followed during his appointment are rheumatoid factor and  Bmet.   PROCEDURE PERFORMED:  The patient had tooth extraction and right facial  abscess drainage.   CONSULTANTS:  1. Lilli Few, MD.  [place] T. Erik Obey, MD, ENT doctor.   BRIEF HISTORY OF PRESENT ILLNESS:  This is a 55 year old man with past  medical history of diabetes, hypertension, and hyperlipidemia who  presents with right facial swelling and pain since 4 days prior to  admission.  He relates that the pain and the swelling got worse one day  prior to admission.  He denies fever, chills, myalgia, or drainage.  No  dyspnea.  No difficulty breathing or swallowing.   PHYSICAL EXAMINATION:  VITAL SIGNS:  Temperature 98.7, blood pressure  160/76, pulse 80,  respirations 16, and sats 98% on room air.  GENERAL:  The patient was in no acute distress.  EYES:  Pupils equal and reactive to light.  ENT:  He has edema of the right cheek and neck, tender to palpation.  NECK:  Supple.  RESPIRATORY:  Clear to auscultation.  CARDIOVASCULAR:  S1 and S2.  No murmur.  NEURO EXAM:  Nonfocal.   LABORATORY DATA:  Sodium 136, potassium 4.0, chloride 102, bicarb 29,  BUN 23, creatinine 1.4, and glucose 222.  White blood cells 15.2,  hemoglobin 12.1, hematocrit 31.6, platelets 248, PT 13.9, INR 1.0, PTT  32, LDL 236, HDL 37, triglycerides 109, cholesterol 295, bilirubin 0.6,  alkaline phosphatase 96, AST 12, ALT 14, and protein 3.1.   PROCEDURE PERFORMED:  1. CT maxillofacial, 11 x 12 mm abscess lateral to the right maxilla.      These appear to be related to an infected tooth #2 which has      periapical lucency and cortical destruction.  Also mild chronic      sinusitis.  2. Orthopantogram.  Right maxillary and mandibular dental caries and      periapical  lucency are seen on CT scan.  3. Renal ultrasound.  The right and the left kidney without renal      parenchyma abnormality.  No hydronephrosis.  4. X-ray/suprapatellar effusion.  Mild tricompartmental      osteoarthritis, more pronounced in the patellofemoral compartment.      No osseous lesion, no fracture.   HOSPITAL COURSE:  1. Facial abscess.  The patient was admitted to regular floor.  He was      started on IV antibiotics, vancomycin and Zosyn.  ENT and dentist      were consulted.  Facial abscess drainage and tooth extraction were      done  during this admission.  The patient's swelling improved      during this admission.  IV antibiotics were stopped and then he was      restarted on p.o. clindamycin.  White blood cell were trending down      and on the day of discharge, his white blood cell was 11.7.  The      patient remained afebrile.  No culture was obtained.   1. Acute renal failure.  The patient developed during this admission,      acute renal failure.  His creatinine increased from 1.1 to 1.4 and      besides on IV fluid, the patient's creatinine increased to 2.4.      Vancomycin and Zosyn were stopped because we were considering that      his acute renal failure was likely secondary to antibiotics.  Renal      ultrasound was negative for hydronephrosis or obstruction.  His      FENa was 1.  So, it was less likely prerenal failure. Urine      Analysis for eosinophil and for muddy brown cast was also negative.      The patient was also receiving hydration.  His creatinine decreased      from 2.4 to 2.2 on the day of discharge.  He will need a BMET as an      outpatient.  Lisinopril and Lasix were stopped due to his acute      renal failure.  These need to be restarted as an outpatient when      renal failure resolves.   1. Left knee effusion.  On the 57rd day  of hospitalization, the patient      developed left knee pain and swelling.  It was thought to be       secondary to inflammatory arthritis.  We proceeded with knee tap      and the culture of synovial fluid was negative for any organism.      The white blood cell of the synovial fluid was 14,000 with a      neutrophil 88%.  No crystal was seen in the synovial fluid.  These      findings are consistent with inflammatory arthritis.  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  Rheumatoid      factor was ordered to check for rheumatoid arthritis.  He will need      a workup for secondary cause of osteoarthritis as an outpatient.   1. Diabetes.  We decreased his insulin dose during this      hospitalization due to the patient was having poor oral intake.      His hemoglobin A1c was 12.  We will need to readjust his insulin      dose during his next appointment.   DISCHARGE LABORATORY AND VITALS:  On the day of discharge, the patient  was in good condition.  Physical therapy was arranged for the patient at  home.  Vitals, blood pressure 142/76, pulse 75, respirations 19, and  temperature 99.8.  Sodium 139, potassium 3.6, creatinine 2.25, BUN 19,  glucose 159, white blood cell 11.4, hemoglobin 9.2. and platelet 250.      Niel Hummer, MD  Electronically Signed      Evette Doffing, M.D.  Electronically Signed    BR/MEDQ  D:  04/26/2008  T:  04/27/2008  Job:  [id number]

## 2010-10-17 NOTE — Assessment & Plan Note (Signed)
[name]                         ELECTROPHYSIOLOGY OFFICE NOTE   [name], [name]                       MRN:          [id number]  DATE:11/20/2006                            DOB:          01/11/1956    [name] is seen.  He has a resolved tachycardia-induced  cardiomyopathy.  His big issue now is his weight.  We spent about 20  minutes talking about that and the importance of working on weight  reduction.   His weight today is up another 10 pounds and it is 290 pounds.  His  blood pressure is 142/70, pulse is 77, and lungs were clear.   IMPRESSION:  1. Nonischemic cardiomyopathy, now resolved.  2. Left atrial tachycardia, presumably contributing to #1.  3. Diabetes.  4. Hypertension.  5. Morbid obesity.   I will plan to see him again in 6 months' time and I have encouraged him  to try to shoot to lose about 2 pounds a month in the interim.     Deboraha Sprang, MD, [facility]  Electronically Signed    SCK/MedQ  DD: 11/20/2006  DT: 11/20/2006  Job #: [id number]   cc:   Erma Heritage, M.D.

## 2010-10-17 NOTE — Assessment & Plan Note (Signed)
[facility]                         ELECTROPHYSIOLOGY OFFICE NOTE   HANI, BLADOW                       MRN:          [id number]  DATE:02/23/2008                            DOB:          17-Aug-1955    Mr. Vanderham is seen in followup for cardiomyopathy that was thought to be  tachycardia-mediated has resolved with medical therapy of his atrial  arrhythmia.   Most recent assessment of left ventricular systolic function  demonstrated near normal function that was in March 2008.  He is doing  pretty well.  From an exercise tolerance point of view, the big issue  has been progressive weight gain (see below).  This is aggravated by  recent ankle injury.   MEDICATIONS:  1. Insulin.  2. Aspirin.  3. Omeprazole.  4. Furosemide 80.  5. Coreg 25 b.i.d.  6. Lisinopril 20.   PHYSICAL EXAMINATION:  VITAL SIGNS:  His blood pressure is well-  controlled at 136/82 with pulse of 85.  His weight was 298 up from 286  nine months ago.  LUNGS:  Clear.  HEART:  Sounds were regular.  ABDOMEN:  Soft.  EXTREMITIES:  Without edema.   Electrocardiogram today demonstrates a sinus rhythm at 85 with intervals  of 0.14/0.09/0.38, the axis was leftward at -11.   IMPRESSION:  1. Atrial tachycardia - resolved.  2. Cardiomyopathy resolving with atrial tachycardia.  3. Morbid obesity.   Mr. Futrell is doing well from arrhythmia point of view.  We will continue  on his current medications.   We spent about 15 minutes discussing weight and strategies for losing  weight.  What we came up with was to put the exercise bicycle that he  has at home in front of the television and see if he cannot learn to do  two things at once.   We will see him again in 1 year's time.     Deboraha Sprang, MD, [facility]  Electronically Signed    SCK/MedQ  DD: 02/23/2008  DT: 02/24/2008  Job #: [id number]

## 2010-10-17 NOTE — Consult Note (Signed)
NAME:  [name] NO.:  [id number]   MEDICAL RECORD NO.:  [id number]          PATIENT TYPE:  INP   LOCATION:  [id number]                         FACILITY:  [facility]   PHYSICIAN:  Lenn Cal, D.D.S.DATE OF BIRTH:  1955-08-17   DATE OF CONSULTATION:  04/20/2008  DATE OF DISCHARGE:                                 CONSULTATION   REFERRING PHYSICIAN:  Evette Doffing, M.D.   [name] is a 54 year old male referred by Dr. [name]. Phifer  for a dental consultation.  The patient was admitted for right facial  swelling.  Dental consultation was requested to evaluate and provide  treatment as indicated.   PAST MEDICAL HISTORY:  1. Right facial swelling, reason for this admission.  2. Diabetes mellitus, type 2 with nephropathy and retinopathy      secondary to the diabetes.  3. Hypertension.  4. Hyperlipidemia.  5. Cardiomyopathy.  6. Gastroesophageal reflux disorder.  7. History of paroxysmal atrial tachycardia.   ALLERGIES:  None known.   MEDICATIONS:  1. Vancomycin 1750 mg IV every 12 hours.  2. NovoLog insulin per sliding scale.  3. NPH insulin 45 units daily.  4. Carvedilol 25 mg daily.  5. Zosyn 3/0.375 g IV every 8 hours.  6. Protonix 40 mg daily.  7. Lisinopril 20 mg daily.   SOCIAL HISTORY:  The patient is single.  The patient has never smoked.  The patient with occasional use of alcohol.  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   FAMILY HISTORY:  Mother died in her 5s with stroke and diabetic  complications.  Father died of unknown causes.   FUNCTIONAL ASSESSMENT:  The patient was independent for ADLs prior to  this admission.   REVIEW OF SYSTEMS:  History reviewed from the chart and health history  assessment form for this admission.   CHIEF COMPLAINT:  Dental consultation requested to evaluate potential  source of right facial swelling and to provide dental treatment as  indicated.   HISTORY OF PRESENT  ILLNESS:  The patient with a history of swelling,  which started down the right face approximately on April 14, 2008.  The patient indicates that the pain and swelling worsened over the  weekend and the patient subsequently went to the emergency room on  Monday, April 19, 2008.  The patient was subsequently admitted,  placed on IV antibiotic therapy.  The patient currently is complaining  of some toothache symptoms and more pain from the significant swelling  over the right face and the infraorbital and right facial area.  The  patient currently indicates that he has spontaneous and sharp pain and  is relieved somewhat with the pain medication.  The patient indicates he  last saw a dentist a few years ago to have several teeth pulled.  The  patient does not seek regular dental care and has no regular dentist.   PHYSICAL EXAMINATION:  GENERAL:  The patient is a well-developed, well-  nourished male with no apparent acute distress.  The patient does  indicate the swelling appears to be increasing  by his report.  The  patient, however, denies significant shortness of breath or difficulty  swallowing at this time.  VITAL SIGNS:  Blood pressure is 154/85, pulse is 81, respirations are  18, temperature is 98.8.  NECK:  The patient with significant right facial swelling with some  closing of the right eye.  The swelling is diffuse over the infraorbital  area and extends down into the mandible area.  There is significant  right neck lymphadenopathy noted at this time.  The patient denies acute  TMJ symptoms and the patient is able to open approximately 30 mm at  least.   INTRAORAL EXAM:  The patient with normal saliva.  There is evidence of  an abscess formation involving the right buccal vestibule and the area  of tooth numbers #1 through #6.  This area appears to be fluctuant in  nature.  Ideally, this could have an incision and drainage performed by  the ear, nose, and throat  specialist.   DENTITION:  The patient is missing tooth #1, #16, #18, #19, and #31.  The patient has retained roots in the area of tooth number #3 and #14.   DENTAL CARIES:  There are dental caries affecting tooth numbers #3 and  #14 at this time.  We would need a full series of dental radiographs to  identify the extent of the dental caries.   ENDODONTIC:  The patient with a history of acute pulpitis symptoms  involving the upper right molar.  This appears to have some periapical  pathology including the abscess in the buccal vestibule.  There appears  to be periapical pathology associated with retained roots numbers #14  and #30 as well.   CROWN OR BRIDGE:  There are no crown or bridge restorations.   PROSTHODONTIC:  The patient denies history of partial dentures.   OCCLUSION:  The patient with a poor occlusal scheme secondary to  multiple missing teeth, supereruption and drifting of the unopposed  teeth into the edentulous areas.   PERIODONTAL:  The patient with chronic advanced periodontal disease,  plaque calculus accumulations, generalized gingival recession, and tooth  mobility.   RADIOGRAPHIC INTERPRETATION:  A panoramic x-ray was obtained on April 19, 2008.   There are multiple missing teeth.  There are multiple diastemas.  There  are retained roots in the area of tooth numbers #3 and #14.  There is  periapical pathology associated with tooth numbers #3, #14, and #30.  There is supereruption and drifting of the unopposed teeth into the  edentulous areas.   ASSESSMENT:  1. History of right facial swelling, most likely secondary to      odontogenicic infections associated with tooth numbers #3 and #30.  2. Abscess formation in the right buccal vestibule in the area of      numbers #2 through #6.  Ideally, this could have an incision and      drainage performed by the ear, nose, and throat specialist.  3. History of acute pulpitis symptoms and periapical  pathology.  4. History of oral neglect.  5. Multiple retained root segments.  6. Dental caries.  7. Poor occlusal scheme.  8. Supereruption and drifting of the unopposed teeth into the      edentulous areas.  9. Multiple areas of apical periodontitis.  10.No significant trismus at this time.   PLAN/RECOMMENDATIONS:  1. I have discussed the risks, benefits, complications, and various      treatment options with the patient in relationship to  his medical      and dental conditions.  We discussed various treatment options to      include multiple extractions with alveoloplasty as indicated.  We      also discussed possible need for incision and drainage by the ear,      nose, and throat specialist.  I have contacted the internal      medicine team to follow up with the ear, nose, and throat      specialist to determine if they are able to perform incision and      drainage at this time.  In the meantime, I will continue planning      the operating room procedure currently scheduled after my first      case, which will be approximately at 11:15 a.m. to perform multiple      extractions with alveoloplasty as indicated.  2. Suggest continuation of IV antibiotic therapy as indicated.      Lenn Cal, D.D.S.  Electronically Signed     RFK/MEDQ  D:  04/20/2008  T:  04/21/2008  Job:  [id number]   cc:   Ileene Hutchinson T. Erik Obey, M.D.  Evette Doffing, M.D.

## 2010-10-17 NOTE — Assessment & Plan Note (Signed)
[facility]                         ELECTROPHYSIOLOGY OFFICE NOTE   [name], [name]                       MRN:          [id number]  DATE:06/18/2007                            DOB:          [birth year]    HISTORY OF PRESENT ILLNESS:  [name] is seen following resolution of  tachycardia induced cardiomyopathy.  He is doing quite well without  complaints of chest pain or shortness of breath.  On examination his  weight is down from 290-286.  His blood pressure remains elevated at  155/96.  His medications are notable for insulin , omeprazole, digoxin,  furosemide, Coreg, simvastatin, lisinopril 20.  In addition to the vital  signs noted above, on examination his lungs were clear.  His heart  sounds were regular.  Extremities with trace edema.  Skin was warm and  dry.   Electrocardiogram dated today demonstrated sinus rhythm at 63 with  intervals of 0.16/0.09/0.37.  There are ST-T changes in the  inferolateral leads and they were fairly nonspecific but were somewhat  worse compared to June 18.   IMPRESSION:  1. History of left distal tachycardia.  2. Cardiomyopathy, now resolved, presumably related to #1.  3. Obesity.  4. Diabetes.  5. Hypertension.   [name] has made a little  bit of headway with his weight with a 4-  pound weight loss represent about 1.5%. We are going to continue to  encourage him to try and shoot for 2-3 pounds a month.   I have also given her risk factor modification tracking card.  This  applies to his hemoglobin, his diet.  This applies initially to his  diabetes and hypertension.  I have told him that his target blood  pressure is 130/80.  His target hemoglobin A1c is under 6.5.  the last  record we have is about a year ago and it was 11.7 or so.  We will plan  to see him again in six months time.     Deboraha Sprang, MD, [facility]  Electronically Signed    SCK/MedQ  DD: 06/18/2007  DT: 06/18/2007  Job #:  [id number]   cc:   Erma Heritage, M.D.

## 2010-10-17 NOTE — Consult Note (Signed)
NAME:  [name], [name]                ACCOUNT NO.:  [id number]   MEDICAL RECORD NO.:  [id number]          PATIENT TYPE:  INP   LOCATION:  3018                         FACILITY:  [facility]   PHYSICIAN:  [name], M.D. DATE OF BIRTH:  Aug 10, 1955   DATE OF CONSULTATION:  04/20/2008  DATE OF DISCHARGE:                                 CONSULTATION   CHIEF COMPLAINT:  Right facial tenderness/swelling.   HISTORY:  A 55 year old black male diabetic began developing pain in the  right upper teeth and cheek roughly 5-6 days ago.  It has gotten  progressively worse.  He presented to the emergency room this morning  and a CT scan demonstrated an abscess along the base of the maxilla and  several abscessed and carious teeth both upper and lower.  He was  admitted to the hospital and began intravenous antibiotics.  Dr.  Enrique Sack, hospital dentist, was consulted and felt like dental  extractions were in order.  ENT was consulted for drainage of the  abscess acutely.  The patient has had prior trouble with his teeth, but  never a facial abscess of this nature.  He has had low-grade fever.   EXAMINATION:  This is a stocky and obese middle-aged black male.  He has  visible swelling of the right midface.  He appears somewhat  uncomfortable.  He is mildly warm to touch.  Mental status is  appropriate.  He hears well in conversational speech.  Voice is clear  and respirations unlabored through the nose.  The head is atraumatic and  neck supple.  Cranial nerves intact.  Vision intact in each eye.  Ear  canals are clear with normal aerated drums.  Anterior nose is mildly  dry, but healthy.  Oral cavity reveals teeth in fair repair.  He has a  redundant bulging mucosa along the right buccal maxillary alveolus with  no obvious frank drainage.  It is somewhat fluctuant.  It corresponds  with the internal aspect of the external facial swelling which is  roughly 4 cm in diameter and very tender and also with  some erythema.  Oropharynx is redundant, but healthy.  I did not examine nasopharynx or  hypopharynx.  Neck without adenopathy.   X-RAY:  I reviewed a CT scan in multiple axes and also a Panorex done  earlier today showing irregular contrast enhancing locule of fluid  consistent with an abscess along the face of the right maxilla.   IMPRESSION:  1. Odontogenic right maxillary abscess including buccal space.  2. Diabetes mellitus.   PLAN:  I discussed this with the patient.  I recommend we go ahead and  drain the abscess this evening to allow the infection to be in  improving.  He is scheduled for dental extractions per Dr. Enrique Sack  tomorrow.   With informed consent, I anesthetized the right cheek with 1% Xylocaine  with 1:100,000 epinephrine, beginning with an infraorbital nerve block  from exteriorly, and then in a circumferential fashion around the  fluctuant mucosa.  Several minutes were allowed for this to take effect.   Upon ascertaining  adequate anesthesia, a 2-cm linear incision was made  in the buccal mucosa away from the alveolus, but avoiding Stensen  papilla.  Immediately, loculations of pus were encountered.  The  dissection was carried down to the bony face of the maxilla where a bony  defect was palpable.  The wound was explored with a hemostat to make  sure that all loculations had been identified and opened.  Hemostasis  was spontaneous.   A one-quarter inch Penrose drain was placed into the depth of the  abscess cavity and secured to the buccal mucosa with a 3-0 silk stitch.  The external portion of the drain was cut short.  The patient tolerated  the entire procedure nicely.  After completing the procedure, I had him  gargle with a 50/50 mixture of saline and peroxide to clean up all old  blood and secretions.   He will continue on his current antibiotic management.  He will require  some analgesics.  I will have him rinse his mouth every 2 hours while   awake.  He can have some dinner tonight, but will need to be n.p.o. past  midnight.      [name], M.D.  Electronically Signed     KTW/MEDQ  D:  04/20/2008  T:  04/21/2008  Job:  [id number]   cc:   Evette Doffing, M.D.  Lenn Cal, D.D.S.

## 2010-10-20 NOTE — Cardiovascular Report (Signed)
NAME:  [name], [name]                ACCOUNT NO.:  [id number]   MEDICAL RECORD NO.:  [id number]          PATIENT TYPE:  INP   LOCATION:  4729                         FACILITY:  [facility]   PHYSICIAN:  Wallis Bamberg. Johnsie Cancel, MD, FACCDATE OF BIRTH:  1956/05/23   DATE OF PROCEDURE:  05/13/2006  DATE OF DISCHARGE:                            CARDIAC CATHETERIZATION   Right and left heart cath.   INDICATIONS:  New onset congestive heart failure.   Cine catheterization was done from the right femoral artery and vein.   Left main coronary artery was normal.   Left anterior descending artery was normal.   Circumflex coronary artery was nondominant and normal.   Right coronary artery was dominant and normal.   No LV gram was done due to the patient's creatinine of 1.4 and diabetes.  By echo, he had global hypokinesis with an EF of 25%.   Right heart catheterization was done to assess heart failure.  Mean  right atrial pressure was 14, RV pressure was 58/9, PA pressure was  55/25, mean pulmonary capillary wedge pressure was 29.  Cardiac index by  Fick was 1.9 liters per minute per meters squared, aortic pressure was  115/73, LV pressure was 118/18.   Interestingly, the patient's heart rate during the case was only 75  beats per minute.   IMPRESSION:  The patient has a nonischemic cardiomyopathy.  He will be  evaluated by Electrophysiology for possible right atrial ectopic  tachycardia.  He will continue his current medications which include  Coreg, dig, Lasix, and lisinopril.  He appears to still have significant  volume on board with the mean wedge pressure close to 30.  He tolerated  procedure well.      Wallis Bamberg. Johnsie Cancel, MD, [facility]  Electronically Signed     PCN/MEDQ  D:  05/13/2006  T:  05/13/2006  Job:  [id number]

## 2010-10-20 NOTE — Discharge Summary (Signed)
NAME:  [name], [name]                ACCOUNT NO.:  [id number]   MEDICAL RECORD NO.:  [id number]          PATIENT TYPE:  INP   LOCATION:  4729                         FACILITY:  [facility]   PHYSICIAN:  Thomes Lolling, M.D.    DATE OF BIRTH:  1956-03-15   DATE OF ADMISSION:  05/10/2006  DATE OF DISCHARGE:  05/14/2006                               DISCHARGE SUMMARY   CONTINUITY DOCTOR:  Dr. Riccardo Dubin.   CONSULTANTS:  [facility].   DISCHARGE DIAGNOSES:  1. 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  2. Urinary tract infection.  3. Diabetes.  4. Increase in liver function tests.  5. Hyperlipidemia.  6. Hypertension.   DISCHARGE MEDICATIONS:  1. Humulin R 15 units twice a day.  2. Humulin N 40 units twice a day.  3. Aspirin 81 mg daily.  4. Omeprazole 40 mg when needed.  5. Digoxin 0.25 mg daily.  6. Coreg 6.25 mg twice a day.  7. Bactrim double strength 1 tablet b.i.d.  8. Lasix 40 mg daily.  9. Lisinopril 10 mg daily.  10.Zocor 10 mg daily.   DISPOSITION:  Patient will followup at [facility] with Dr.  Caryl Comes, where an auto event monitor will be given to him.  Here also, we  will recheck his blood pressure and to see how he is doing on his rhythm  control.  We will also check if the patient tolerates an increase in his  Coreg to better control his tachycardia.  Patient would also need a BMET  on his followup appointment to see how he is tolerating his medication  and a Digoxin level to see therapeutic level, if it is within  therapeutic range.  Patient will also follow up with Dr. Riccardo Dubin at  the outpatient clinic on December 19, at 9:15.  Here, his blood glucose  will be checked and make any adjustments if needed.  Patient's  appointment with [facility] will be on December 20 at 12 p.m.   PROCEDURE PERFORMED:  Patient had a right heart cath that showed  nonischemic cardiomyopathy and tachycardia.  Patient had a 2D echo that  showed  global hypokinesia with left ventricular size on the upper limits  with an EF of 35%.  This study was inadequate for regional wall motion  abnormality.  The aortic valve was normal.  The mitral valve showed mild  valvular regurgitation with a mean trans mitral gradient of 2 mLHg.  Mitral valve areas, by pressure, half time was 4 cm.  The right  ventricle was mildly dilated.  Patient had an ultrasound Doppler of  lower extremities that showed no obvious signs of DVT.  Patient also had  an ultrasound of the abdomen that showed bilateral pleural effusion,  pancreas that was obscured; otherwise, no intraabdominal pathology.   ADMITTING H&P:  [name] is a 55 year old African American male  with a past medical history significant for hypertension, diabetes type  2 and not taking his medications, arthritis of the right ankle that  presents with shortness of breath.  He reported that shortness of breath  had increased for  1 week that is associated with dry cough, chills and 2-  3 pillow orthopnea, paroxysmal nocturnal dyspnea and his shortness of  breath is aggravated by exertion.  Patient has no chest pain, no fever  and no palpitations.  He has also no prior episodes.   VITAL SIGNS:  On admission, temperature 98.3, blood pressure 160/103,  pulse 139, respiration rate 20.  He was saturating 96% on room air.  HEENT:  His eyes are anicteric.  No pallor.  NECK:  Supple.  No carotid bruits.  No JVD.  RESPIRATIONS:  He has bilateral mild crackles with wheezing, no rhonchi  and decreased breathing sound due to body habitus.  CARDIOVASCULAR:  He has decreased heart sounds with tachycardia and  regular rate with an S1 and S2 and a systolic murmur.  GI:  Soft, obese, positive bowel sounds.  Nontender.  No organomegaly.  No Murphys sign.  EXTREMITIES:  He has bilateral pedal edema 2 to the knee.   LABS ON ADMISSION:  White blood cell 10.1, hemoglobin 13.1, platelets  255 with an absolute  lymphocyte count of 4.5 and absolute neutrophilic  count of 4.8.  Cardiac enzymes:  Troponin 0.8, CK 291, CK-MB 4.1.  Second set of cardiac enzymes showed a CK of 240, CK-MB of 3.1, troponin  of 0.6.  Third set of cardiac enzymes showed a CK of 204, CK-MB of 3.2  and a troponin of 0.5.  D-dimer was 0.82.  UDS was negative.  TSH was  1.3.  Sodium was 139, potassium of 3.8, chloride 106, bicarb 26, glucose  123, BUN 15, creatinine 1.1, bilirubin 0.6, alkaline phosphatase 141,  AST 119, ALT 136, total protein 6.5, albumin 3.4, calcium 9.4.   HOSPITAL COURSE:  1. Nonischemic cardiomyopathy, secondary to atrial tachycardia.      Patient was admitted to the telemetry unit.  Cardiac enzymes were      cycled, they showed just a slight increase on the first set of      cardiac enzymes and repeat cardiac enzymes were within normal      limits.  TSH was done, which was within normal.  Due to the      patient's echo of low EF, a cath was done to rule out any coronary      artery disease.  It showed no ischemic cardiomyopathy.  So,      cardiology recommended an EP evaluation.  EP evaluated the patient      and thought the patient to have his cardiomyopathy secondary to his      atrial tachycardia.  So, they recommended to start Coreg and      increase as tolerated and ACE inhibitors.  Also, we added Lasix and      lisinopril.  By the time of discharge, patient's blood pressure was      111/24 with a pulse of 77.  So, his Coreg was increased and the      patient remained asymptomatic during the day.  2. UTI.  A UA was done, that showed a urinary tract infection.  Urine      culture is pending to date.  Patient was started on Bactrim      empirically double strength and patient responded.  Patient      remained afebrile.  His white blood cell came down throughout his      hospital stay.  3. Diabetes.  Patient was kept on his home regimen of Humulin R 15  units twice a day and Humulin N 40 units  twice a day and the      patient's CBG, at the day of discharge, were 174-183.  4. Increasing LFTs.  An ultrasound was done that showed no gallstones.      Patient had no pain on admission.  This was thought to be secondary      to his CHF.  5. Hyperlipidemia.  Patient was continued on his Zocor.  No changes      were made to his medication.  6. Hypertension.  Patient's blood pressure was higher on admission, so      patient was started on lisinopril 10 mg due to his diabetes, which      he responded well and cardiology was consulted and recommended      Lasix 40 mg to help on his hypertension and his nonischemic      cardiomyopathy.  When EP saw him, they recommended digoxin and      Coreg.  Patient's blood pressure, at the time of discharge, was      102/76.   LABS:  On the day of discharge, showed a sodium of 135, potassium 4.1,  chloride 101, bicarb 28, BUN 18, creatinine 1.6, calcium 9.1.  White  blood cell of 6.9, hemoglobin of 12.3 and platelets of 223.  His PT was  13.1 and his INR was 1.0.   Vitals, on the day of discharge, temperature 98.5, pulse 77,  respirations 18, blood pressure 132/77 with a CBG of 183.  Patient was  saturating 97% on room air and his weight was 271.4 pounds, 123  kilograms.      Charlynne Cousins, M.D.  Electronically Signed      Thomes Lolling, M.D.  Electronically Signed    AF/MEDQ  D:  05/14/2006  T:  05/15/2006  Job:  [id number]   cc:   Deboraha Sprang, MD, [facility]  Erma Heritage, M.D.

## 2010-10-20 NOTE — Assessment & Plan Note (Signed)
[facility]                         ELECTROPHYSIOLOGY OFFICE NOTE   [name], [name]                       MRN:          [id number]  DATE:05/23/2006                            DOB:          18-Apr-1956    [name] is seen today.  He has a cardiomyopathy that is hoped to be  tachycardia mediated.  He was found to have an atrial tachycardia with a  biphasic P wave in lead V1 (positive negative).  He was put on Coreg and  lisinopril and his heart rate monitor, which he comes in today to be  surveyed, is running normal.   He wears a LifeVest because of his LV dysfunction that was estimated in  the 20-25% range.   His other medications include Lanoxin 0.25, furosemide 40, omeprazole  20, carvedilol dose at 6.25, and aspirin.  He is also on insulin and  Zocor.   On examination today, his blood pressure was 130/70 and pulse is 73.  Neck veins were flat.  Lungs were clear, heart sounds were regular, the  extremities had 1+ edema.   Electrocardiogram demonstrated sinus rhythm at 72 with intervals of  0.15/0.09/0.036 with an axis that was leftward at -30.   IMPRESSION:  1. Atrial tachycardia, appears to be quiescent.  2. Cardiomyopathy, presumed related to #1.  3. LifeVest for #1.  4. Diabetes.  5. Obesity.  6. Peripheral edema.   Mr. Hafler is doing pretty well.  We will plan now to double his  carvedilol to 12.5.  I have also asked him to take his Lasix of 80  alternating with 40 for a week to see if we cannot make some improvement  on his edema.  We will see him again in about 3-4 weeks' time and we  will tentatively set him up for an ESI-guided atrial tachycardia  ablation in mid to late January.     Deboraha Sprang, MD, [facility]  Electronically Signed    SCK/MedQ  DD: 05/23/2006  DT: 05/23/2006  Job #: [id number]

## 2010-10-20 NOTE — H&P (Signed)
NAME:  [name], [name] NO.:  [id number]   MEDICAL RECORD NO.:  [id number]          PATIENT TYPE:  OIB   LOCATION:  2899                         FACILITY:  [place]   PHYSICIAN:  Deboraha Sprang, MD, FACCDATE OF BIRTH:  January 15, 1956   DATE OF ADMISSION:  06/28/2006  DATE OF DISCHARGE:                              HISTORY & PHYSICAL   CARDIOLOGIST:  Shaune Pascal. Bensimhon, M.D.   ELECTROPHYSIOLOGIST:  Deboraha Sprang, M.D.   PRIMARY CAREGIVER:  [facility].   This patient has no known drug allergies.   PRESENTING CIRCUMSTANCES:  I'm here for the procedure.   HISTORY OF PRESENT ILLNESS:  [name] is a 55 year old male presented  to the emergency room at [facility] May 11, 2007 with progressive  dyspnea and orthopnea. At that time, he thought he was having a cold and  was taking decongestants and fluids.  The patient had no prior history  of heart disease.  Electrocardiogram showed a sinus tachycardia.  He had  an echocardiogram at bedside by Dr. Haroldine Laws in the emergency room  which demonstrated ejection fraction of 25-30%.  He subsequently  underwent left and right heart catheterization. This study revealed  normal coronaries, with diagnosis of nonischemic cardiomyopathy.  This  cardiomyopathy was suspected related to ectopic atrial tachycardia  mediation.   The patient was eventually discharged with a LifeVest and medical  therapy consisting of Coreg, lisinopril, digoxin, and Lasix.  The  patient wore a Holter monitor which confirms atrial tachycardia.  His  rhythm is fairly well controlled under medications at that time. He saw  Dr. Caryl Comes in the office on May 23, 2006, and he was in sinus  rhythm.   PAST MEDICAL HISTORY:  1. Diabetes x12 years.  2. Obesity.  3. Hypertension.  4. Arthritis.  5. He feels back to his baseline respiratory status at work now.  He      has normal activity without dyspnea.  He has New York Heart      Association  class I-II congestive heart failure.   SOCIAL HISTORY:  He is divorced. Lives alone. Works as a Engineer, maintenance for [employer].  He does not use drugs.  He does not smoke.  He drinks very occasionally.   FAMILY HISTORY:  Mother died in her 76s.  She had a history of diabetes  and stroke.  Father died of unknown causes.  He has a brother with COPD  and a brother with diabetes.  There is no family history of premature  coronary artery disease.   MEDICATIONS:  1. Aspirin 81 mg daily.  2. Lisinopril 10 mg daily.  3. Coreg 6.25 mg twice daily.  4. Zocor 10 mg daily at bedtime.  5. Digoxin 0.25 mg daily.  6. Furosemide 40 mg daily.  7. Omeprazole 20 mg 2 tablets daily.  8. Humulin R 17 units b.i.d.  9. Humulin N 45 units b.i.d.  10.The patient also takes Motrin for arthritis and chondroitin      sulfate/glucosamine.   REVIEW OF SYSTEMS:  The patient is  not experiencing fevers, chills.  He  has no significant weight gain or loss in the last 6 months.  The  patient is not having epistaxis or hemoptysis.  He does not have vertigo  or photophobia.  The patient is not experiencing chest pain, orthopnea,  dyspnea on exertion, palpitations, or presyncope.  The patient is not  experiencing dysuria.  He has minor nocturia, but he is on Lasix.  He  has no history of GI bleed.  He does have mild gastroesophageal reflux.  He has arthralgias for which he takes Motrin, as mentioned above.  They  seem to be fairly disseminated arthralgias.  No neuro deficits.  The  patient has a longstanding history of diabetes, as mentioned. He has no  prior history of myocardial infarction, cerebrovascular accident,  seizure, peptic ulcer disease, or GI bleeding.   PHYSICAL EXAMINATION:  VITAL SIGNS:  Temperature is 97.4, pulse is 71  and regular, respiratory rate is 20, oxygen saturations on room air 99%,  blood pressure is 155/87 the patient weighs 175 kg.  GENERAL:  He is alert and  oriented x3.  HEENT:  Normocephalic, atraumatic.  Eyes: Pupils equal, round, reactive  to light.  Extraocular movements are intact.  NECK:  Supple.  No carotid bruits auscultated.  No jugular venous  distension.  LUNGS:  Clear to auscultation bilaterally.  HEART: Regular rate and rhythm, without murmur.  ABDOMEN:  Soft, nondistended, nontender.  Bowel sounds are present.  He  is obese.  EXTREMITIES:  Show dorsalis pedis pulses are palpable at 4/4  bilaterally.  NEUROLOGIC:  He does not exhibit any neurologic deficits.   IMPRESSION:  1. Nonischemic cardiomyopathy, with ejection fraction, 25%-30%,      echocardiogram on May 11, 2007.  2. Class I-II congestive heart failure according to New [place] Heart      Association ratings.  The patient is currently wearing a LifeVest      for his nonischemic cardiomyopathy.  3. Hypertension.  4. Arthritis.  5. Diabetes x12 years.   PLAN:  ESI mapping, ablation of the atrial tachycardia Dr. Virl Axe  today.      Sueanne [name], [place]      Deboraha Sprang, MD, [facility]  Electronically Signed    GM/MEDQ  D:  06/28/2006  T:  06/28/2006  Job:  [id number]

## 2010-10-20 NOTE — Discharge Summary (Signed)
NAME:  [name], [name]                ACCOUNT NO.:  [id number]   MEDICAL RECORD NO.:  [id number]          PATIENT TYPE:  INP   LOCATION:  4729                         FACILITY:  [place]   PHYSICIAN:  Shaune Pascal. Bensimhon, MDDATE OF BIRTH:  [birth year]   DATE OF ADMISSION:  05/10/2006  DATE OF DISCHARGE:  05/15/2006                               DISCHARGE SUMMARY   ALLERGIES:  NO KNOWN DRUG ALLERGIES.   PRINCIPAL DIAGNOSES:  1. Admit with progressive dyspnea x1 week.  2. Finding of recurrent atrial tachycardia, probably left-sided during      this hospitalization.  3. Bedside echo May 10, 2006 ejection fraction 25-30%.  4. Left heart catheterization May 13, 2006 angiographically      normal coronaries, this is a nonischemic cardiomyopathy, possibly      tachycardia mediated.  5. Positive urinary tract infection this admission with Bactrim      treatment.   SECONDARY DIAGNOSES:  1. Obesity.  2. Osteoarthritis.  3. Diabetes with a hemoglobin A1c of 11.2.  4. Hypertension.  The patient was seen in consultation by Dr. Virl Axe.   BRIEF HISTORY:  [name] is a 55 year old male who has had trouble  breathing.  When it first occurred about 1 week ago, he thought it was  due to a cold going around at work.  He dosed himself with cold  medicines and drank tea.  He seemed to get better over a weekend but  back at work, walking he had trouble handling his work load, he had  trouble especially walking up inclines.   At [facility] he was found to have a positive urinary tract  infection, bedside echocardiogram showed ejection fraction of 25-30%, he  was scheduled for a catheterization and his telemetry shows tachycardia  sudden onset.  The patient is not aware that his heart is racing.  He  has no palpitations, no chest pain, no prior history of heart disease,  no history of pre/syncope.   HOSPITAL COURSE:  The patient presented to [facility] May 10, 2006 with progressive dyspnea.  He had effective IV diuresis with  Lasix before changing to oral Lasix.  He has effectively lost about 11  pounds to diuresis this hospitalization.  He was scheduled for LifeVest  fitting but unfortunately was discharged early May 14, 2006 before  the fitting could take place and was returned to the hospital after  discovery of this by [facility] at the office where he went to  pick up an auto event recorder.  The patient will be fitted with  LifeVest May 15, 2006 in the afternoon and then he is able to be  discharged.   DISCHARGE INSTRUCTIONS:  1. He will go home with medication therapy.  2. LifeVest once again fitted May 15, 2006 in the afternoon.  3. He will pick up the auto event monitor at the office.  4. He has office visit with Dr. Virl Axe May 23, 2006 at      noon.  5. He will have a follow-up  echocardiogram some 4-6 weeks out from      this hospitalization on maximal medical therapy.  6. Possible return at some point for ablation of an atrial      tachycardia.   MEDICATIONS AT DISCHARGE:  1. Humulin R 15 units twice a day.  2. Humulin N 40 units twice a day.  3. Aspirin 81 mg daily.  4. Omeprazole 40 mg as needed.  5. Digoxin 0.25 mg daily.  6. Coreg 6.25 mg twice daily.  7. Bactrim-DS one tablet twice daily for two more days.  8. Lasix 40 mg daily.  9. Lisinopril 10 mg daily.  10.Zocor 10 mg daily at bedtime.   FOLLOW UP:  Once again follow up at [facility] with Dr. Caryl Comes  May 23, 2006 at noon.      Sueanne [name], PA      Shaune Pascal. Bensimhon, MD  Electronically Signed    GM/MEDQ  D:  05/15/2006  T:  05/15/2006  Job:  [id number]   cc:   Deboraha Sprang, MD, [facility]

## 2010-10-20 NOTE — Discharge Summary (Signed)
NAME:  ESTOL, GARRIS NO.:  [id number]   MEDICAL RECORD NO.:  [id number]          PATIENT TYPE:  OIB   LOCATION:  2899                         FACILITY:  [place]   PHYSICIAN:  Sueanne [name], PA   DATE OF BIRTH:  [birth year]   DATE OF ADMISSION:  08/01/2006  DATE OF DISCHARGE:  08/01/2006                               DISCHARGE SUMMARY   ALLERGIES:  THIS PATIENT HAS NO KNOWN DRUG ALLERGIES.   PRINCIPAL DIAGNOSES:  1. Nonsustained left atrial tachycardia.  2. Transseptal puncture with attempted mapping of a left atrial      tachycardia.  3. Left atrial tachycardia is nonsustained, no ablation attempted   SECONDARY DIAGNOSES:  1. Electrophysiology study with electro-anatomical mapping with      attempted ablation of atrial tachycardia, June 28, 2006.  This      study showed a left atrial tachycardia which would require a      transseptal approach.  2. Nonischemic cardiomyopathy, left heart catheterization, December      2007, ejection fraction of 25-30% with normal coronary anatomy.  3. 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  4. The patient wearing life vest medical therapy consisting of Coreg,      lisinopril, digoxin, and Lasix.  5. Diabetes diagnosed 12 years ago.  6. Obesity.  7. Hypertension.  8. Arthritis.  9. New York Heart Association class I-II congestive heart failure      symptoms, chronic, systolic.   PROCEDURE:  On August 01, 2006, transseptal approach search for left  atrial tachycardia/no ablation attempted as tachycardia could not be  sustained, Dr. Cristopher Peru, patient discharging same day with continued  medical therapy.   BRIEF HISTORY:  Mr. Waldinger is a 55 year old male.  He presented to the  emergency room on May 11, 2007.  He had progressive dyspnea.  The  patient has had no prior history of coronary disease.  The  electrocardiogram showed a sinus tachycardia.  An echocardiogram  at  bedside showed ejection fraction 25-30%.  He subsequently underwent left  and right heart catheterization.  The study showed normal coronary  arteries.  He has a diagnosis of nonischemic cardiomyopathy with  possible cardiomyopathy related to ectopic atrial tachycardia.  He  subsequently presented for electrophysiology study with Dr. Virl Axe  on June 28, 2006.  In the interim, he was wearing a life vest.  He  underwent electro-anatomical mapping with the finding that the patient  had a left atrial tachycardia which originated at the right inferior  pulmonary vein region.  It breaks through to the right atrium.  The  study was discontinued.  The patient was set up for a return to lab for  a transseptal puncture with mapping of the patient's left atrial  tachycardia.  This was planned for now, August 01, 2006, with Dr.  Lovena Le.   HOSPITAL COURSE:  The patient presented on August 01, 2006, in a  fasting state.  He underwent an transseptal puncture with mapping of the  left atrium with search for a left atrial tachycardia.  This  could not  be sustained and no radiofrequency ablation was delivered, the patient  discharging the same day to continue on medical therapy.   DISCHARGE MEDICATIONS:  The patient discharged on:  1. Enteric-coated aspirin 81 mg daily.  2. Nexium 40 mg daily.  3. Digoxin 0.25 mg daily.  4. Coreg 25 mg twice daily.  This was increased when he saw Dr. Caryl Comes      on July 11, 2006, at office visit.  5. Furosemide 80 mg daily.  6. Lisinopril 10 mg daily.  7. Zocor 10 mg daily at bedtime.  8. Humulin-N 40 units twice daily.  9. Humulin-R 15 units twice daily.   FOLLOW UP:  He follows up with [facility] at [address] with Dr. Caryl Comes Tuesday August 20, 2006, at 3:30.  There was a  question if Mr. Shakur is still wearing a Lifecor life vest and what we  will do with this in the future.   LABORATORY STUDIES PERTINENT TO THIS  ADMISSION:  White cells 12.1,  hemoglobin 13, hematocrit 39.1, platelets 210,000.  Pro-time 11.7, INR  0.9.  Serum electrolytes, sodium 140, potassium is 4.5, chloride 100,  carbonate 33, glucose is 300, BUN is 15, creatinine 1.1.      Sueanne [name], PA     GM/MEDQ  D:  08/01/2006  T:  08/01/2006  Job:  [id number]   cc:   Deboraha Sprang, MD, [facility]

## 2010-10-20 NOTE — Discharge Summary (Signed)
NAME:  [name], [name] NO.:  [id number]   MEDICAL RECORD NO.:  [id number]          PATIENT TYPE:  OIB   LOCATION:  2899                         FACILITY:  [place]   PHYSICIAN:  Deboraha Sprang, MD, FACCDATE OF BIRTH:  [birth year]   DATE OF ADMISSION:  06/28/2006  DATE OF DISCHARGE:  06/28/2006                               DISCHARGE SUMMARY   ALLERGIES:  No known drug allergies.   PRINCIPAL DIAGNOSES:  1. Discharging status post electrophysiology study with electro-      anatomical mapping, attempted ablation of atrial tachycardia.      a.     This study showed that the patient had left atrial       tachycardia.  The patient will need follow up procedure with trans-       septal approach.      b.     Dr. Caryl Comes will discuss with Dr. Lovena Le and arrange for       future procedure.      c.     The left atrium at the coronary sinus activates proximal to       distal.   SECONDARY DIAGNOSES:  1. Nonischemic cardiomyopathy, left heart catheterization December      2008 with ejection fraction 25-30%, normal coronary anatomy.  2. Electrocardiogram demonstrates ectopic atrial tachycardia mediation      possibly causing a tachycardia mediated cardiomyopathy.  3. The patient wearing LifeVest, medical therapy consists of Coreg,      lisinopril, digoxin, and Lasix.  4. Diabetes diagnosed 12 years ago.  5. Obesity.  6. Hypertension.  7. Arthritis.  8. New York Heart Association class I-II congestive heart failure.   BRIEF HISTORY:  [name] is a 55 year old male who presented to  the emergency room at [facility] May 11, 2007, with  progressive dyspnea and orthopnea. At that time, he thought that he was  suffering cold symptoms and had bought cold medications but noted that  the dyspnea was quite pronounced with slight activity. In fact, he had  trouble walking from his car to his office without becoming very acutely  short of breath.  The patient, on  presentation, had no prior history of  heart disease.  Electrocardiogram showed a tachycardia.  He had a  bedside echocardiogram in the emergency room which demonstrated an EF of  25-30%.  Subsequent left and right heart catheterization revealed normal  coronary anatomy. The patient's nonischemic cardiomyopathy was suspected  relating related to a tachycardia mediation. The patient was discharged  with a LifeVest and medical therapy which consists of Coreg, lisinopril,  digoxin, and Lasix.  The Holter monitor the patient wore after discharge  confirms an atrial tachycardia.  The patient presents for [facility] mapping  and atrial tachycardia ablation by Dr. Virl Axe.   HOSPITAL COURSE:  The patient presented electively on June 28, 2006,  for electro-anatomical mapping and radiofrequency catheter ablation of  atrial tachycardia. On mapping today in the electrophysiology  laboratory, it was determined that his atrial tachycardia originates in  the left atrium. The procedure was discontinued  with plans made for a  future trans-septal approach and ablation of the left atrial  tachycardia. The patient was mildly hypertensive with systolic blood  pressure [id number] this admission and his Coreg and lisinopril have both been  increased for him to take at increased doses at discharge. He is asked  to be very careful with his activity for the next 24 hours, not to drive  for 24 hours, to avoid alcoholic beverages, and have someone at home at  all times. If he experiences bleeding from the groin, he is to apply  pressure and to call [phone].   MEDICATIONS AT DISCHARGE:  1. Aspirin 81 mg daily.  2. Lisinopril 20 mg daily, this is a new dose.  3. Coreg 12.5 mg twice daily, this is a new dose.  4. Zocor 10 mg daily.  5. Digoxin 0.25 mg daily.  6. Lasix 40 mg daily.  7. Omeprazole 20 mg, 2 tablets daily.   LABORATORY STUDIES PERTINENT TO THIS ADMISSION:  Taken June 28, 2006,  hemoglobin 13.2,  hematocrit 39.2, white cells 9.2.  Serum electrolytes  sodium 140, potassium is 5, chloride 105, carbonate 28, BUN is 12,  creatinine 0.9, glucose is 180. Protime is 13.1, INR is 1.  His calcium  is 9.1.   The patient has been given prescriptions for the new doses of both Coreg  and lisinopril.  He will follow up with Dr. Virl Axe Thursday,  February 7 at 1:45 p.m.      Sueanne [name], [place]      Deboraha Sprang, MD, [facility]  Electronically Signed    GM/MEDQ  D:  06/28/2006  T:  06/28/2006  Job:  [id number]

## 2010-10-20 NOTE — Op Note (Signed)
NAME:  [name], [name] NO.:  [id number]   MEDICAL RECORD NO.:  [id number]          PATIENT TYPE:  OIB   LOCATION:  2899                         FACILITY:  [place]   PHYSICIAN:  Champ Mungo. Lovena Le, MD    DATE OF BIRTH:  01/22/1956   DATE OF PROCEDURE:  08/01/2006  DATE OF DISCHARGE:                               OPERATIVE REPORT   PROCEDURE PERFORMED:  Electrophysiologic study with attempted induction  of arrhythmia utilizing Isuprel.   INTRODUCTION:  The patient is a 55 year old man who presented initially  several weeks ago with an incessant atrial tachycardia and a tachycardia  induced cardiomyopathy.  He underwent electrophysiologic study by Dr.  Caryl Comes and at that time was found to have the left atrial tachycardia.  He was placed on heart failure medications and returns today for  potential transseptal catheterization and left atrial ablation.   PROCEDURE:  After informed consent was obtained, the patient was taken  to the diagnostic EP lab in the fasting state.  At the usual preparation  and draping, intravenous fentanyl and midazolam was given for sedation.  A 6-French hexapolar catheter was inserted percutaneously through the  right jugular vein and advanced to the coronary sinus.  A 5-French  quadripolar catheter was inserted percutaneously in the right femoral  vein and advanced to the His bundle region.  A 7-French 20-pole halo  catheter was inserted percutaneously in the right femoral vein and  advanced to the right atrium.  After the measurement of the basic  intervals, rapid ventricular pacing was carried out demonstrating VA  dissociation at 500 milliseconds.  Programmed ventricular stimulation  was also carried out demonstrating VA dissociation at 500 milliseconds.  Programmed atrial stimulation was then carried out from the coronary  sinus as well as the high right atrium at base drive cycle lengths of  600, 500, and 400 milliseconds.  S1-S2 and S1,  S2, S3 stimuli were  delivered with the S1-S2 and S2-S3 interval stepwise decreased down to  atrial refractoriness.  During probing stimulation, there was  nonsustained (typically three to four beats) of the patient's initial  atrial tachycardia at a cycle length of about 590 milliseconds.  Mapping  demonstrated the earliest atrial activation to be at the septum of the  right atrium.  This area was earlier in activation than the proximal,  middle or distal coronary sinus activation.  Unfortunately, the  tachycardia could not be sustained.  Rapid atrial pacing was then  carried out from the coronary sinus and the right atrium at pacing cycle  length of 600 milliseconds and stepwise decreased down to 420  milliseconds where AV Wenckebach was observed.  Additional decrements  were carried out down to 200 milliseconds but despite pacing, there was  no inducible SVT.  The patient would have occasional PAC of the same  morphology of his initial tachycardia beats but there was no sustained  arrhythmias.  At this point, isoproterenol was infused at rates between  1 and 6 mcg per minute.  Additional rapid atrial pacing was carried out  both from the coronary sinus as  well as the right atrium but despite a  very aggressive pacing protocol with pacing down to 200 milliseconds,  tachycardia could not be induced.  The patient will typically have 2 or  3 beats of nonsustained atrial tachycardia but never any sustained  arrhythmia.  Despite high dose isoproterenol and because of the lack of  sustained arrhythmia, the procedure was abandoned and the catheters were  removed.  Hemostasis was assured and the patient was returned to his  room in satisfactory condition.   COMPLICATIONS:  There were no immediate procedure complications.   RESULTS:  1. Baseline ECG:  The baseline ECG demonstrates sinus rhythm, normal      axis intervals.  2. Baseline intervals:  Sinus node cycle length was 1094  milliseconds.      The HV interval was 44 milliseconds.  The AH interval was 64      milliseconds.  The QRS duration was 119 milliseconds.  3. Rapid ventricular pacing:  Rapid atrial pacing at 500 milliseconds      demonstrated VA dissociation.  4. Programmed ventricular stimulation:  Programmed atrial stimulation      at 500 milliseconds demonstrated VA dissociation.  5. Rapid atrial pacing:  Rapid atrial pacing was carried out from the      coronary sinus and the high right atrium and stepwise decreased      down to 420 milliseconds where AV Wenckebach was observed.  During      rapid atrial pacing the PR interval was less than the RR interval      and there was no inducible SVT.  Additional decrements were carried      out down to 200 milliseconds and again no inducible sustained      atrial tachycardia or other SVTs could be induced.  6. Programmed atrial stimulation:  Programmed atrial stimulation was      carried out from the coronary sinus and high right atrium at base      drive cycle length of 600, 500, and 400 milliseconds.  S1-S2 and      S1, S2, S3 stimuli were delivered with the S1-S2 and S2-S3 interval      stepwise decreased down to ventricular refractoriness.  Despite      this there was no inducible SVT.  7. Arrhythmias observed.      a.     Nonsustained atrial tach.  Cycle length 590 milliseconds.       Initiation was rapid atrial pacing.  Duration was nonsustained.       Termination was spontaneous.  8. Mapping:  Mapping of atrial tachycardia demonstrated the earliest      atrial activation at the right atrial septum.  This was not      particularly early with regard to the patient's surface ECG, with      this confirming the presence of a left atrial tachycardia.   CONCLUSION:  This study demonstrated no inducible sustained atrial  tachycardia despite a very aggressive pacing protocol utilizing  isoproterenol at up to 6 mcg per minute.  Because of the  left-sided nature the tachycardia and because it was of its inability to be  inducibly sustained, the decision not to proceed with ablation was  carried out.  Recommendation will be to continue the patient on his  present heart failure medications and following clinically.      Champ Mungo. Lovena Le, MD  Electronically Signed     GWT/MEDQ  D:  08/01/2006  T:  08/01/2006  Job:  [id number]   cc:   Deboraha Sprang, MD, [facility]

## 2010-10-20 NOTE — Assessment & Plan Note (Signed)
[facility]                         ELECTROPHYSIOLOGY OFFICE NOTE   LUIZ, ANGELOS                       MRN:          [id number]  DATE:07/11/2006                            DOB:          [birth year]    Mr. Cagnina is seen. He underwent an ESI Guidant EP study a couple of  weeks ago which demonstrated a left atrial tachycardia. This was despite  the fact that P wave morphologies had suggested new rhythm biphasic B1  that it would be on the right side. However, he has a cardiomyopathy  which is thought to be tachycardia mediated and I reviewed this with Dr.  Lovena Le. The plan would be to bring him back to the lab tentatively on  February 28th where  Transeptal puncture and mapping will be undertaken to try and map and  ablate this left atrial tachycardia.   His current medications will be adjusted to try and control his rate by  taking his Coreg from 12.5 b.i.d. to 25 b.i.d. and his Lasix which is 80  a day will be left at 80 a day.   His lungs were clear today. Heart sounds were regular. Blood pressure  was 124/74, pulse was 120 as noted and he had 1+ edema.     Deboraha Sprang, MD, [facility]  Electronically Signed    SCK/MedQ  DD: 07/11/2006  DT: 07/11/2006  Job #: [id number]

## 2010-10-20 NOTE — Op Note (Signed)
NAME:  [name], [name] NO.:  [id number]   MEDICAL RECORD NO.:  [id number]          PATIENT TYPE:  OIB   LOCATION:  2899                         FACILITY:  [place]   PHYSICIAN:  Deboraha Sprang, MD, FACCDATE OF BIRTH:  06-19-1955   DATE OF PROCEDURE:  06/28/2006  DATE OF DISCHARGE:  06/28/2006                               OPERATIVE REPORT   PREOPERATIVE DIAGNOSIS:  Atrial tachycardia.   POSTOPERATIVE DIAGNOSIS:  Atrial tachycardia.   PROCEDURE:  Invasive electrophysiological study with electrical  anatomical mapping.   Following obtaining informed consent, the patient was brought to the  electrophysiology laboratory and placed on the fluoroscopic table in the  supine position.  After routine prep and drape, cardiac catheterization  was performed with local anesthesia and conscious sedation.  Noninvasive  blood pressure monitoring, transcutaneous oxygen saturation monitoring,  and end-tidal CO2 monitoring were performed continuously throughout the  procedure.  Following the procedure, the catheters were removed,  hemostasis was obtained, and the patient was transferred to the floor in  stable condition.   CATHETERS:  A 5-French quadripolar catheter was inserted via femoral  vein to AV junction.  A 6-French octapolar catheter inserted right femoral vein to the  coronary sinus.  A 9-French DSI array catheter was inserted via the left femoral vein to  the right atrium.  An 8-French 5 mm deflective tip ablation catheter was inserted via the  right femoral vein to the right atrium.   SURFACE LEADS:  1, aVF, and v1 were monitored continuously.  Following  insertion of the catheters, the stimulation protocol included  incremental atrial pacing, single double atrial extra stimuli at paced  cycle length of 350, 500 milliseconds, incremental ventricular pacing.   RESULTS:  1. Surface electrocardiogram:  Initial rhythm is sinus; RR interval:  806 milliseconds; PR  interval:  160 milliseconds; QRS duration:  85 milliseconds; QT interval:  357  milliseconds; P-wave duration 74 milliseconds.  AH interval:  105 milliseconds; HV interval 44 milliseconds.  AV nodal function:  First line AV Wenckebach 350 milliseconds.  VA conduction was dissociated at 600 milliseconds.  The atrial effective refractory period at a paced cycle length of 500  milliseconds was 280 milliseconds and AV nodal conduction was  continuous.   1. Accessory pathway function:  No evidence of accessory pathway was      identified.   1. Ventricular response to programmed stimulation:  Normal for      ventricular stimulation as described above.   1. Arrhythmias induced:  Atrial tachycardia was reproducibly induced      with coronary sinus pacing at 300 milliseconds.  The cycle length      ranged from 590 to 550 milliseconds and it would terminate      spontaneously.   ELECTROPHYSIOLOGICAL MAPPING OF THIS CATHETER DEMONSTRATED:  1. Negative P-waves in the inferior leads of the surface      electrocardiogram.  2. Left atrial activation high in the coronary sinus was from proximal      to distal.  3. The earliest right atrial activation was +50 milliseconds prior  onset of surface P-wave.  4. Right atrial break through was in the mid to high septum.   CONCLUSION:  It was our conclusion, thereafter, that this patient had a  left atrial tachycardia originating from the right inferior pulmonary  vein region with break through to the right atrium.  This was for not  withstanding a very high pretest probability of the right atrial  tachycardia based on the patient's positive/negative V1 P-wave  morphology.   FLUOROSCOPY TIME:  Total of 9 minutes and 51 seconds in the biplane  mode.   The patient will return to the lab for transseptal puncture and mapping  of the patient's tachycardia with the hopes that we can ablate the  tachycardia that may be contributing to his  cardiomyopathy.      Deboraha Sprang, MD, [facility]  Electronically Signed     SCK/MEDQ  D:  06/28/2006  T:  06/28/2006  Job:  [id number]

## 2010-10-20 NOTE — Consult Note (Signed)
NAME:  [name], [name]                ACCOUNT NO.:  [id number]   MEDICAL RECORD NO.:  [id number]          PATIENT TYPE:  INP   LOCATION:  4729                         FACILITY:  [place]   PHYSICIAN:  Shaune Pascal. Bensimhon, MDDATE OF BIRTH:  11/13/1955   DATE OF CONSULTATION:  05/10/2006  DATE OF DISCHARGE:                                 CONSULTATION   PRIMARY CARE PHYSICIAN:  Dr. Derrel Nip.   CARDIOLOGIST:  He is new to [facility].   REASON FOR CONSULTATION:  Dyspnea for 1 week and tachycardia.   HISTORY OF PRESENT ILLNESS:  [name] is a very pleasant 55 year old  male with a history of diabetes (x12 years), hypertension and obesity.  He denies any history of known heart disease.  He did have a remote  stress test as part of a job interview but has never had a cardiac  catheterization or echocardiogram.  He states that starting last Friday  he began to notice progressive dyspnea on exertion and orthopnea.  However, he continued to work as a Pharmacist, community for [employer].  He did notice that on walking he was more short of  breath.  He denied any chest pain, PND or lower extremity edema.  He  took some decongestants thinking that this might be a respiratory  infection.  However, his shortness of breath persisted and tonight he  came to emergency room.  In the emergency room his chest x-ray showed  CHF, his EKG showed sinus tachycardia with a rate of 130, he was given  40 mg of IV Lasix with 1 liter only of urine out and symptomatic  improvement.   REVIEW OF SYSTEMS:  He denies fevers or chills.  He has not had any  nausea and vomiting.  No black stools.  He has had a nonproductive  cough.  No weight gain.  No neurologic symptoms.  Remainder of review of  systems is negative except for HPI and problem list.   PROBLEM LIST:  1. Hypertension diagnosed 1 year ago.  2. Diabetes x12 years.  3. Obesity.  4. Arthritis.   MEDICATIONS ON ADMISSION:  Aspirin  81 a day, Motrin for arthritis,  insulin, chondroitin sulfate and glucosamine and hydrochlorothiazide  which he was not taking.  He is stopped taking his blood pressure  medication 1 year ago.   ALLERGIES:  NO KNOWN DRUG ALLERGIES.   SOCIAL HISTORY:  He is divorced.  He lives alone.  He works as a Engineer, maintenance for [employer].  He denies any drugs.  He does not smoke.  He drinks very occasionally.   FAMILY HISTORY:  Mother died in her 72s.  She had a history of diabetes  and stroke.  Father is deceased of unknown causes.  He has a brother  with COPD and a brother with diabetes.  There is no family history of  premature coronary artery disease.   PHYSICAL EXAMINATION:  GENERAL:  He is lying flat in bed in no acute  distress.  VITAL SIGNS:  Respirations are unlabored.  Blood pressure  initially was  168/103 and on followup 123/73.  He is afebrile, heart rate is 130.  He  is saturating 96% on room air.  HEENT:  Sclerae anicteric.  EOMI.  There is no xanthelasma.  Mucous  membranes are moist.  There is good dentition.  Oropharynx is clear.  NECK:  Supple and thick, unable to evaluate JVP accurately.  Carotids  are 2+ bilaterally without bruits.  There is no lymphadenopathy or  thyromegaly.  CARDIAC:  He has distant heart sounds.  He is tachycardiac and regular.  No obvious murmur.  There is no S3 appreciated.  RESPIRATORY:  Lungs are clear.  ABDOMEN:  Abdomen is obese, nontender, nondistended.  No  hepatosplenomegaly, no bruits, no masses.  Good bowel sounds.  EXTREMITIES:  Warm with no cyanosis or clubbing.  There is 2+ edema  bilaterally.  There are no rashes or arthropathies.  Distal pulses are  2+.  NEURO:  He is alert and oriented x3.  Cranial nerves II-XII are intact.  Moves all four extremities without difficulty.  Affect is appropriate.   DIAGNOSTIC STUDIES:  EKG shows sinus tachycardia, rate of 131, with  nonspecific T-wave abnormalities.  Chest  x-ray shows congestive heart  failure.  White count is 10.1, hemoglobin is 13.1, platelets are 255.  Sodium is 139, potassium 3.9, chloride is 109, bicarb is 24, BUN is 17,  creatinine is 1.3, glucose is 147.  Point-of-care markers:  Troponin is  0.08, CK-MB is 4.4, BNP is pending.  Bedside echo which I performed  showed a left ventricular ejection fraction of 25-30% with dilated LV.  There is trivial mitral regurgitation.  The RV appeared grossly normal.   ASSESSMENT:  1. New-onset congestive heart failure with evidence of left      ventricular systolic dysfunction.  2. Hypertension.  3. Diabetes.  4. Obesity.   PLAN/DISCUSSION:  I suspect his cardiomyopathy is nonischemic but given  his multiple cardiac risk factors he will obviously need a cardiac  catheterization.  For now, we will continue diuresis, start digoxin 0.25  mg a day and captopril.  We will also try to initiate low dose beta  blocker in the morning.  We will also order a formal 2-D echocardiogram.  For completeness sake, we will check a TSH, HIV and lipid panel.   We appreciate the consult and will continue to follow with you.      Shaune Pascal. Bensimhon, MD  Electronically Signed     DRB/MEDQ  D:  05/10/2006  T:  05/11/2006  Job:  [id number]

## 2010-10-20 NOTE — Letter (Signed)
August 21, 2006     RE:  QASIM, SCHICKEL  MRN:  [id number]  /  DOB:  05-11-1956   Erma Heritage, M.D.  [address] [address]   Dear Sherren Mocha,   Mr. Matheus is seen following presentation with an incessant atrial  tachycardia.  It was mapped with electronic internal mapping and found  to be left-sided.  We brought him back, subsequently for a repeat  procedure, but he was not inducible at that time.  Medical therapy has  been pursued with Coreg and Digoxin.  In the interim, his ejection  fraction has gone from being 20-25% to nearly normal.   On examination, today, his blood pressure is 122/78, his pulse was 64.  Lungs were clear.  The heart sounds were regular.  His extremities were  without edema.   IMPRESSION:  1. Left atrial tachycardia.  2. Tachycardia induced cardiomyopathy, now resolved.  3. Diabetes.  4. Hypertension.   We will plan to see him again, in about 2 months time; and then follow  with Dr. Riccardo Dubin about the care of his diabetes.    Sincerely,     Deboraha Sprang, MD, [facility]  Electronically Signed   SCK/MedQ  DD: 08/21/2006  DT: 08/22/2006  Job #: [id number]

## 2010-11-06 ENCOUNTER — Other Ambulatory Visit: Payer: Self-pay | Admitting: Dietician

## 2010-11-06 NOTE — Progress Notes (Signed)
DSMT referral per verbal order

## 2010-11-17 ENCOUNTER — Other Ambulatory Visit: Payer: Self-pay | Admitting: Internal Medicine

## 2010-11-17 DIAGNOSIS — E785 Hyperlipidemia, unspecified: Secondary | ICD-10-CM

## 2010-12-11 ENCOUNTER — Encounter: Payer: 59 | Admitting: Internal Medicine

## 2010-12-25 ENCOUNTER — Ambulatory Visit (INDEPENDENT_AMBULATORY_CARE_PROVIDER_SITE_OTHER): Payer: 59 | Admitting: Internal Medicine

## 2010-12-25 ENCOUNTER — Ambulatory Visit (INDEPENDENT_AMBULATORY_CARE_PROVIDER_SITE_OTHER): Payer: 59 | Admitting: Dietician

## 2010-12-25 ENCOUNTER — Encounter: Payer: Self-pay | Admitting: Internal Medicine

## 2010-12-25 VITALS — BP 139/77 | HR 63 | Temp 97.9°F | Ht 71.0 in | Wt 292.7 lb

## 2010-12-25 DIAGNOSIS — I10 Essential (primary) hypertension: Secondary | ICD-10-CM

## 2010-12-25 DIAGNOSIS — D649 Anemia, unspecified: Secondary | ICD-10-CM

## 2010-12-25 DIAGNOSIS — M1990 Unspecified osteoarthritis, unspecified site: Secondary | ICD-10-CM

## 2010-12-25 DIAGNOSIS — N179 Acute kidney failure, unspecified: Secondary | ICD-10-CM

## 2010-12-25 DIAGNOSIS — I428 Other cardiomyopathies: Secondary | ICD-10-CM

## 2010-12-25 DIAGNOSIS — E785 Hyperlipidemia, unspecified: Secondary | ICD-10-CM

## 2010-12-25 DIAGNOSIS — Z23 Encounter for immunization: Secondary | ICD-10-CM

## 2010-12-25 DIAGNOSIS — E119 Type 2 diabetes mellitus without complications: Secondary | ICD-10-CM

## 2010-12-25 DIAGNOSIS — E1139 Type 2 diabetes mellitus with other diabetic ophthalmic complication: Secondary | ICD-10-CM

## 2010-12-25 LAB — COMPREHENSIVE METABOLIC PANEL
ALT: 16 U/L (ref 0–53)
AST: 14 U/L (ref 0–37)
Alkaline Phosphatase: 71 U/L (ref 39–117)
Creat: 1.64 mg/dL — ABNORMAL HIGH (ref 0.50–1.35)
Total Bilirubin: 0.2 mg/dL — ABNORMAL LOW (ref 0.3–1.2)

## 2010-12-25 LAB — LIPID PANEL
HDL: 43 mg/dL (ref >39)
Triglycerides: 417 mg/dL — ABNORMAL HIGH (ref <150)

## 2010-12-25 LAB — CBC
HCT: 37.5 % — ABNORMAL LOW (ref 39.0–52.0)
MCH: 28.9 pg (ref 26.0–34.0)
MCHC: 33.1 g/dL (ref 30.0–36.0)
MCV: 87.4 fL (ref 78.0–100.0)
RDW: 13.1 % (ref 11.5–15.5)

## 2010-12-25 MED ORDER — GLUCOSE BLOOD VI STRP: ORAL_STRIP | Status: DC

## 2010-12-25 MED ORDER — LISINOPRIL 30 MG PO TABS: 30.0000 mg | ORAL_TABLET | Freq: Every day | ORAL | Status: DC

## 2010-12-25 MED ORDER — TRAMADOL HCL 50 MG PO TABS: 50.0000 mg | ORAL_TABLET | Freq: Three times a day (TID) | ORAL | Status: DC | PRN

## 2010-12-25 MED ORDER — "INSULIN SYRINGE-NEEDLE U-100 29G X 1/2"" 0.3 ML MISC": Status: DC

## 2010-12-25 MED ORDER — INSULIN GLARGINE 100 UNIT/ML ~~LOC~~ SOLN: SUBCUTANEOUS | Status: DC

## 2010-12-25 MED ORDER — INSULIN ASPART 100 UNIT/ML ~~LOC~~ SOLN: 10.0000 [IU] | Freq: Once | SUBCUTANEOUS | Status: DC

## 2010-12-25 NOTE — Patient Instructions (Addendum)
Reduce lantus dose to a total of 100 units in the morning ( take 50 units in 2 different injections)  Begin 10 units Novolog ( rapid acting insulin ) 15 minutes before supper  Check blood sugar before going to bed each night as long as it is more than 110- you are fine. (If your bedtime blood sugar is less than 110 have a small snack before bed.).    Follow up with [name] in 2 weeks for a regular visit.

## 2010-12-25 NOTE — Progress Notes (Signed)
Diabetes Self-Management Training (DSMT)  Follow-Up 3 Visit  12/25/2010 [name], identified by name and date of birth, is a 55 y.o. male with Type 2 Diabetes. Year of diabetes diagnosis: 1995  ASSESSMENT Patient concerns are Glycemic control and Weight control. Gets frustrated that even when he feels that he is doing well, his blood sugars are high, then sometimes when he doesn't feel like he is eating healthy, his blood sugars are lower.  Height 5\' 11"  (1.803 m), weight 292 lb 11.2 oz (132.768 kg). Body mass index is 40.82 kg/(m^2). Lab Results  Component Value Date   LDLCALC 156* 05/30/2010   Lab Results  Component Value Date   HGBA1C 8.7 09/18/2010   Medication Nutrition Monitor: one touch  Labs reviewed.  DIABETES BUNDLE: A1C in past 6 months? Yes.  Less than 7%? No LDL in past year? Yes.  Less than 100 mg/dL? No Microalbumin ratio in past year? No. Patient taking ACE or ARB? Yes. Blood pressure less than 130/80? Yes. Foot exam in last year? Yes. Eye exam in past year? Yes. Tobacco use? No. Pneumovax? No Flu vaccine? Yes Asprin? Yes  Family history of diabetes: Yes  Support systems: family  Special needs: None  Prior DM Education: Yes   Medications See Medications list.  Is interested in learning more  Patients belief/attitude about diabetes: No matter what I do I can't control my blood glucose levels.  Self foot exams daily: Yes  Diabetes Complications: Neuropathy Retinopathy   Exercise Plan Doing ADLs, physical/active work and walking for 60 minutesa day.   Self-Monitoring Frequency of testing: 1-2 times/day Breakfast: 178-228  Hyperglycemia: Yes Daily Hypoglycemia: No   Meal Planning Some knowledge and No interest in improving   Assessment comments: patient seems very frustrated by high blood sugars: discussed benefits and burdens and options of new medicine to help him better control his diabetes- incretin mimetics vs mealtime  insulin, both would be about the same copay for him.    INDIVIDUAL DIABETES EDUCATION PLAN:  Medical Nutrition Therapy Monitoring _______________________________________________________________________  Intervention TOPICS COVERED TODAY:  Medication  Reviewed patients medication for diabetes, action, purpose, timing of dose and side effects. Monitoring  Purpose and frequency of SMBG. Taught/discussed recording of test results and interpretation of SMBG. Identified appropriate SMBG and A1C goals.  PATIENTS GOALS/PLAN (copy and paste in patient instructions so patient receives a copy): 1.  Learning Objective:       Understand that meter numbers are not a relfection of how hard you are trying andyour value 2.  Behavioral Objective:           Personalized Follow-Up Plan for Ongoing Self Management Support:  [name], family and CDE visits ______________________________________________________________________   Outcomes Expected outcomes: Demonstrated limited interest in learning. Expect minimal changes., Demonstrated interest in learning.Expect positive changes in lifestyle.  Self-care Barriers: Unable to determine  Education material provided: yes  Patient to contact team via Phone if problems or questions.  Time in: 1130 and 1430     Time out: 1530  Future DSMT - 2 wks   [name],[name]

## 2010-12-25 NOTE — Patient Instructions (Signed)
See Dr. Loistine Simas patient instructions.  Please make a follow up for a 30 minute visit to look at your bedtime blood sugars in 2 weeks.

## 2010-12-25 NOTE — Assessment & Plan Note (Addendum)
BP elevated. Will increase Lisinopril today and continue lasix and Coreg. Will re-evalute at the next office visit.   BP Readings from Last 3 Encounters:  12/25/10 139/77  07/31/10 157/75  05/30/10 158/94   Update 7/25: Due to acute renal failure will decrease lasix to 40 mg and Lisinopril to 20 mg daily. Will repeat bmet in one week.

## 2010-12-25 NOTE — Assessment & Plan Note (Signed)
Uncontrolled. Hgb A1c today 9.9.  Patient seen by [name] who discussed with him diet changes ( but did not show much interest), exercise and close monitoring of blood glucose level. Furthermore discussed about changes in management. Will start patient on Novolog 10 units before supper and decrease Lantus to 100 units daily. Patient will need to check blood glucose on a regular basis will see [name] in 2 weeks. Patient may benefit from newer agent but patient would like to try it this way at this point.

## 2010-12-25 NOTE — Progress Notes (Signed)
  Subjective:    Patient ID: [name], male    DOB: 12/25/1955, 55 y.o.   MRN: [id number]  HPI This is a 55 year old male with PMH as outlined above who presented to clinic for a regular office visit. Patient denies any concerns except that his blood glucose level is elevated.  Patient has not been seen in the clinic since 07/2010.  1. Uncontrolled DM, Patient was seen by [name] today. Patient noted that he has not checked his blood glucose on a regular basis since he was really busy and a friend died of cancer which was very stressful. He continued to take Lantus 110 units daily and Metformin.  2. Joint pain: is controlled on current regimen.    Review of Systems  Constitutional: Negative for appetite change and fatigue.  HENT: Negative for hearing loss.   Eyes: Positive for visual disturbance.  Respiratory: Negative for chest tightness and shortness of breath.   Cardiovascular: Negative for chest pain and palpitations.  Gastrointestinal: Negative for nausea, abdominal pain, diarrhea, constipation and abdominal distention.  Genitourinary: Negative for difficulty urinating.  Musculoskeletal: Positive for joint swelling and arthralgias. Negative for myalgias.  Neurological: Negative for dizziness, weakness and light-headedness.       Objective:   Physical Exam  Constitutional: He is oriented to person, place, and time. He appears well-developed and well-nourished.  HENT:  Head: Normocephalic.  Neck: Neck supple.  Cardiovascular: Normal rate and regular rhythm.   Pulmonary/Chest: Effort normal and breath sounds normal.  Abdominal: Soft. Bowel sounds are normal. He exhibits distension. There is no tenderness. There is no rebound.  Musculoskeletal: Normal range of motion.  Neurological: He is alert and oriented to person, place, and time.  Skin: No rash noted.          Assessment & Plan:

## 2010-12-25 NOTE — Assessment & Plan Note (Addendum)
Will check lipid panel today for possible changes in management.  Lab Results  Component Value Date   CHOL 247* 05/30/2010   CHOL 341* 02/22/2010   CHOL 162 03/24/2009   Lab Results  Component Value Date   HDL 50 05/30/2010   HDL 45 02/22/2010   HDL 46 03/24/2009   Lab Results  Component Value Date   LDLCALC 156* 05/30/2010   LDLCALC 257* 02/22/2010   LDLCALC 90 03/24/2009   Lab Results  Component Value Date   TRIG 204* 05/30/2010   TRIG 195* 02/22/2010   TRIG 131 03/24/2009   Lab Results  Component Value Date   CHOLHDL 4.9 Ratio 05/30/2010   CHOLHDL 7.6 Ratio 02/22/2010   CHOLHDL 3.5 Ratio 03/24/2009   Lab Results  Component Value Date   LDLDIRECT 247.6 06/18/2007   Update 7/24: Triglyceride elevated to 417. I am considering to start the patient on Gemfibrozil but patient has acute renal failure at this point. Fenofibrate is to expensive for the patient. Will monitor renal function and will start the medication.

## 2010-12-26 ENCOUNTER — Telehealth: Payer: Self-pay | Admitting: Internal Medicine

## 2010-12-26 DIAGNOSIS — N179 Acute kidney failure, unspecified: Secondary | ICD-10-CM | POA: Insufficient documentation

## 2010-12-26 DIAGNOSIS — D649 Anemia, unspecified: Secondary | ICD-10-CM | POA: Insufficient documentation

## 2010-12-26 MED ORDER — LISINOPRIL 20 MG PO TABS: 20.0000 mg | ORAL_TABLET | Freq: Every day | ORAL | Status: DC

## 2010-12-26 MED ORDER — FUROSEMIDE 40 MG PO TABS: 40.0000 mg | ORAL_TABLET | Freq: Every day | ORAL | Status: DC

## 2010-12-26 NOTE — Progress Notes (Signed)
Addended by: Rosalia Hammers on: 12/26/2010 02:59 PM   Modules accepted: Orders

## 2010-12-26 NOTE — Assessment & Plan Note (Signed)
Patient was found to have acute renal failure. Will reduce lasix from 80 mg to 40 mg and will not increase Lisinopril to 30 mg instead will keep it at 20 mg. Will repeat Bmet in one week.

## 2010-12-26 NOTE — Telephone Encounter (Signed)
Informed about lab results. Notified about changes in medication regimen. Will decrease  Lasix to 40 mg daily and Lisinopril to 20 mg.

## 2010-12-26 NOTE — Assessment & Plan Note (Signed)
Patient was noted to have a Hgb of 12.4 which is a change from 14 one year ago. Patient denies any bloody or black stool. Denies any blood in the urine. Colonoscopy in 2009 was within normal limits. Will continue to monitor. Will check in one week.

## 2011-01-01 ENCOUNTER — Other Ambulatory Visit: Payer: 59

## 2011-01-22 ENCOUNTER — Telehealth: Payer: Self-pay | Admitting: Dietician

## 2011-01-22 NOTE — Telephone Encounter (Signed)
Left message for patient to return call.

## 2011-01-26 NOTE — Telephone Encounter (Signed)
Left 2nd message about follow up to new type of insulin .

## 2011-03-05 LAB — GLUCOSE, CAPILLARY
Glucose-Capillary: 157 — ABNORMAL HIGH
Glucose-Capillary: 176 — ABNORMAL HIGH

## 2011-03-06 LAB — CBC
HCT: 30 — ABNORMAL LOW
HCT: 30.5 — ABNORMAL LOW
HCT: 30.7 — ABNORMAL LOW
HCT: 35 — ABNORMAL LOW
HCT: 36.5 — ABNORMAL LOW
Hemoglobin: 10.2 — ABNORMAL LOW
Hemoglobin: 10.5 — ABNORMAL LOW
Hemoglobin: 11.5 — ABNORMAL LOW
Hemoglobin: 12.1 — ABNORMAL LOW
MCHC: 34.3
MCV: 85.4
MCV: 85.5
MCV: 85.6
MCV: 86.7
MCV: 87.1
Platelets: 223
Platelets: 229
Platelets: 250
Platelets: 256
RBC: 3.51 — ABNORMAL LOW
RBC: 3.57 — ABNORMAL LOW
RBC: 4.18 — ABNORMAL LOW
RDW: 12.5
RDW: 12.9
RDW: 13
RDW: 13
RDW: 13.2
WBC: 11.4 — ABNORMAL HIGH
WBC: 11.7 — ABNORMAL HIGH
WBC: 13.8 — ABNORMAL HIGH
WBC: 15.2 — ABNORMAL HIGH

## 2011-03-06 LAB — POCT I-STAT, CHEM 8
Calcium, Ion: 1.2
Chloride: 102
Glucose, Bld: 222 — ABNORMAL HIGH
HCT: 40
TCO2: 29

## 2011-03-06 LAB — CULTURE, BLOOD (ROUTINE X 2): Culture: NO GROWTH

## 2011-03-06 LAB — GLUCOSE, CAPILLARY
Glucose-Capillary: 112 — ABNORMAL HIGH
Glucose-Capillary: 120 — ABNORMAL HIGH
Glucose-Capillary: 121 — ABNORMAL HIGH
Glucose-Capillary: 121 — ABNORMAL HIGH
Glucose-Capillary: 145 — ABNORMAL HIGH
Glucose-Capillary: 154 — ABNORMAL HIGH
Glucose-Capillary: 163 — ABNORMAL HIGH
Glucose-Capillary: 168 — ABNORMAL HIGH
Glucose-Capillary: 175 — ABNORMAL HIGH
Glucose-Capillary: 178 — ABNORMAL HIGH
Glucose-Capillary: 201 — ABNORMAL HIGH
Glucose-Capillary: 253 — ABNORMAL HIGH
Glucose-Capillary: 261 — ABNORMAL HIGH
Glucose-Capillary: 272 — ABNORMAL HIGH
Glucose-Capillary: 280 — ABNORMAL HIGH
Glucose-Capillary: 298 — ABNORMAL HIGH
Glucose-Capillary: 301 — ABNORMAL HIGH
Glucose-Capillary: 359 — ABNORMAL HIGH
Glucose-Capillary: 92

## 2011-03-06 LAB — URINALYSIS, ROUTINE W REFLEX MICROSCOPIC
Bilirubin Urine: NEGATIVE
Nitrite: NEGATIVE
Protein, ur: 100 — AB
Specific Gravity, Urine: 1.017
Urobilinogen, UA: 1

## 2011-03-06 LAB — DIFFERENTIAL
Eosinophils Absolute: 0.2
Eosinophils Relative: 1
Eosinophils Relative: 2
Lymphocytes Relative: 16
Lymphs Abs: 1.9
Lymphs Abs: 2.4
Monocytes Absolute: 1.1 — ABNORMAL HIGH
Monocytes Absolute: 1.4 — ABNORMAL HIGH
Monocytes Relative: 9
Monocytes Relative: 9
Neutro Abs: 11.2 — ABNORMAL HIGH

## 2011-03-06 LAB — BODY FLUID CULTURE: Culture: NO GROWTH

## 2011-03-06 LAB — BASIC METABOLIC PANEL
BUN: 12
BUN: 15
BUN: 17
BUN: 18
CO2: 25
CO2: 29
Calcium: 8.5
Calcium: 9.4
Chloride: 100
Chloride: 101
Chloride: 106
Chloride: 108
Chloride: 99
Creatinine, Ser: 1.18
Creatinine, Ser: 2.4 — ABNORMAL HIGH
Creatinine, Ser: 2.53 — ABNORMAL HIGH
GFR calc Af Amer: 32 — ABNORMAL LOW
GFR calc Af Amer: 34 — ABNORMAL LOW
GFR calc Af Amer: 34 — ABNORMAL LOW
GFR calc Af Amer: >60
GFR calc non Af Amer: 28 — ABNORMAL LOW
GFR calc non Af Amer: 53 — ABNORMAL LOW
GFR calc non Af Amer: >60
Glucose, Bld: 112 — ABNORMAL HIGH
Glucose, Bld: 133 — ABNORMAL HIGH
Glucose, Bld: 251 — ABNORMAL HIGH
Potassium: 3.5
Potassium: 3.8
Potassium: 4.3
Sodium: 133 — ABNORMAL LOW
Sodium: 135
Sodium: 135
Sodium: 136
Sodium: 137

## 2011-03-06 LAB — URINE CULTURE
Colony Count: NO GROWTH
Culture: NO GROWTH

## 2011-03-06 LAB — GRAM STAIN

## 2011-03-06 LAB — COMPREHENSIVE METABOLIC PANEL
AST: 12
Albumin: 3.1 — ABNORMAL LOW
BUN: 16
Creatinine, Ser: 1.17
GFR calc Af Amer: >60
Total Protein: 7.1

## 2011-03-06 LAB — LIPID PANEL
Cholesterol: 295 — ABNORMAL HIGH
HDL: 37 — ABNORMAL LOW

## 2011-03-06 LAB — URINALYSIS, MICROSCOPIC ONLY
Bilirubin Urine: NEGATIVE
Ketones, ur: NEGATIVE
Nitrite: NEGATIVE
Protein, ur: 30 — AB
pH: 6

## 2011-03-06 LAB — RENAL FUNCTION PANEL
Albumin: 2.1 — ABNORMAL LOW
Chloride: 108
Creatinine, Ser: 2.25 — ABNORMAL HIGH
GFR calc Af Amer: 37 — ABNORMAL LOW
GFR calc non Af Amer: 31 — ABNORMAL LOW
Phosphorus: 3.7
Potassium: 3.6

## 2011-03-06 LAB — PROTEIN, BODY FLUID

## 2011-03-06 LAB — PROTIME-INR: INR: 1

## 2011-03-06 LAB — SYNOVIAL CELL COUNT + DIFF, W/ CRYSTALS
Monocyte-Macrophage-Synovial Fluid: 12 — ABNORMAL LOW
Neutrophil, Synovial: 88 — ABNORMAL HIGH

## 2011-03-06 LAB — RHEUMATOID FACTOR: Rhuematoid fact SerPl-aCnc: <20

## 2011-03-09 LAB — GLUCOSE, CAPILLARY: Glucose-Capillary: 338 mg/dL — ABNORMAL HIGH (ref 70–99)

## 2011-03-12 ENCOUNTER — Other Ambulatory Visit: Payer: Self-pay | Admitting: Internal Medicine

## 2011-03-15 LAB — POCT URINALYSIS DIP (DEVICE)
Bilirubin Urine: NEGATIVE
Ketones, ur: NEGATIVE
pH: 5.5

## 2011-03-20 NOTE — Progress Notes (Signed)
Addended by: Orson Gear on: 03/20/2011 01:28 PM   Modules accepted: Orders

## 2011-03-23 ENCOUNTER — Other Ambulatory Visit: Payer: Self-pay | Admitting: Internal Medicine

## 2011-03-28 ENCOUNTER — Encounter: Payer: Self-pay | Admitting: Internal Medicine

## 2011-03-28 ENCOUNTER — Ambulatory Visit (INDEPENDENT_AMBULATORY_CARE_PROVIDER_SITE_OTHER): Payer: 59 | Admitting: Internal Medicine

## 2011-03-28 VITALS — BP 130/70 | HR 80 | Temp 98.2°F | Ht 71.0 in | Wt 293.0 lb

## 2011-03-28 DIAGNOSIS — E785 Hyperlipidemia, unspecified: Secondary | ICD-10-CM

## 2011-03-28 DIAGNOSIS — E1129 Type 2 diabetes mellitus with other diabetic kidney complication: Secondary | ICD-10-CM

## 2011-03-28 DIAGNOSIS — I10 Essential (primary) hypertension: Secondary | ICD-10-CM

## 2011-03-28 DIAGNOSIS — N179 Acute kidney failure, unspecified: Secondary | ICD-10-CM

## 2011-03-28 DIAGNOSIS — E119 Type 2 diabetes mellitus without complications: Secondary | ICD-10-CM

## 2011-03-28 DIAGNOSIS — D649 Anemia, unspecified: Secondary | ICD-10-CM

## 2011-03-28 LAB — CBC
HCT: 37.8 % — ABNORMAL LOW (ref 39.0–52.0)
MCV: 85.7 fL (ref 78.0–100.0)
RBC: 4.41 MIL/uL (ref 4.22–5.81)
WBC: 7.3 10*3/uL (ref 4.0–10.5)

## 2011-03-28 LAB — BASIC METABOLIC PANEL
Potassium: 4.6 mEq/L (ref 3.5–5.3)
Sodium: 140 mEq/L (ref 135–145)

## 2011-03-28 LAB — POCT GLYCOSYLATED HEMOGLOBIN (HGB A1C): Hemoglobin A1C: 10.9

## 2011-03-28 MED ORDER — EXENATIDE 5 MCG/0.02ML ~~LOC~~ SOPN: 5.0000 ug | PEN_INJECTOR | Freq: Two times a day (BID) | SUBCUTANEOUS | Status: DC

## 2011-03-28 MED ORDER — LISINOPRIL 30 MG PO TABS: 30.0000 mg | ORAL_TABLET | Freq: Every day | ORAL | Status: DC

## 2011-03-28 MED ORDER — INSULIN PEN NEEDLE 31G X 6 MM MISC: Status: DC

## 2011-03-28 MED ORDER — INSULIN PEN NEEDLE 31G X 8 MM MISC: Status: DC

## 2011-03-28 NOTE — Progress Notes (Deleted)
  Subjective:    Patient ID: [name], male    DOB: 1956-01-12, 54 y.o.   MRN: [id number]  HPI    Review of Systems     Objective:   Physical Exam        Assessment & Plan:

## 2011-03-28 NOTE — Progress Notes (Signed)
Subjective:   Patient ID: [name] male   DOB: 05-31-1956 55 y.o.   MRN: [id number]  HPI: [name] is a 55 y.o. male with PMH significant as outlined below who presented to the clinic for a regular follow up. Patient noted that he has been doing fine. He has been taking his insulin on a regular basis. He does not check his sugars that frequently because as he noted he does forget it. He was asked to follow up in July since he was found to have worsening renal function and Hgb drop which he did not. He further continued to take Lisinopril 30 mg instead of 20 mg as recommended.     Past Medical History  Diagnosis Date  . Diabetes mellitus   . Hypertension   . GERD (gastroesophageal reflux disease)   . Hyperlipemia   . Paroxysmal atrial tachycardia   . Cardiomyopathy    Current Outpatient Prescriptions  Medication Sig Dispense Refill  . acetaminophen (TYLENOL) 500 MG tablet Take 500 mg by mouth every 6 (six) hours as needed.        Marland Kitchen aspirin 81 MG tablet Take 81 mg by mouth daily.        . Blood Glucose Monitoring Suppl (ONE TOUCH ULTRA MINI) W/DEVICE KIT Use to check blood sugars during the day at work before and after lunch       . carvedilol (COREG) 25 MG tablet TAKE ONE TABLET BY MOUTH TWICE DAILY  60 tablet  3  . furosemide (LASIX) 40 MG tablet Take 1 tablet (40 mg total) by mouth daily.  30 tablet  3  . Glucosamine-Chondroitin (OSTEO BI-FLEX REGULAR STRENGTH) 250-200 MG TABS Take 4 caplets per day with food       . glucose blood (ONE TOUCH ULTRA TEST) test strip Check blood sugars before each meal  100 each  5  . insulin glargine (LANTUS) 100 UNIT/ML injection Inject 100 units in the morning before breakfast ( take 50 units in 2 different injections).   10 mL  3  . Insulin Syringe-Needle U-100 (SUNMARK INSULIN SYR .3CC/29G) 29G X 1/2" 0.3 ML MISC Use as directed.  200 each  3  . Lancets (ONETOUCH ULTRASOFT) lancets Use to test your blood sugar before each meal each  day       . lisinopril (PRINIVIL,ZESTRIL) 30 MG tablet Take 1 tablet (30 mg total) by mouth daily.  30 tablet  3  . metFORMIN (GLUCOPHAGE) 1000 MG tablet TAKE ONE TABLET BY MOUTH TWICE DAILY  60 tablet  3  . Omega-3 Fatty Acids (FISH OIL) 1200 MG CAPS Take 2 capsules by mouth daily.        . pravastatin (PRAVACHOL) 80 MG tablet Take 80 mg by mouth every evening.        . traMADol (ULTRAM) 50 MG tablet Take 1 tablet (50 mg total) by mouth 3 (three) times daily as needed. For pain  30 tablet  3  . exenatide (BYETTA 5 MCG PEN) 5 MCG/0.02ML SOLN Inject 0.02 mLs (5 mcg total) into the skin 2 (two) times daily with a meal.  1.2 mL  3  . Insulin Pen Needle 31G X 8 MM MISC Use twice a day for insulin.  100 each  0  . tadalafil (CIALIS) 10 MG tablet Take 10 mg by mouth daily as needed. For sexual activity        Review of Systems: Constitutional: Denies fever, chills, diaphoresis, appetite change and fatigue.  Respiratory:  Denies SOB, DOE, cough, chest tightness,  and wheezing.   Cardiovascular: Denies chest pain, palpitations and leg swelling.  Gastrointestinal: Denies nausea, vomiting, abdominal pain, diarrhea, constipation, blood in stool and abdominal distention.  Genitourinary: Denies dysuria, urgency, frequency, hematuria, flank pain and difficulty urinating.  Musculoskeletal: Occasionally myalgias, back pain, joint swelling, arthralgias and gait problem.  Skin: Denies pallor, rash and wound.  Neurological: Denies dizziness, seizures, syncope, weakness, light-headedness, numbness and headaches.  Hematological: Denies adenopathy. Easy bruising, personal or family bleeding history    Objective:  Physical Exam: Filed Vitals:   03/28/11 1541 03/28/11 1542  BP: 157/84 130/70  Pulse: 80   Temp: 98.2 F (36.8 C)   TempSrc: Oral   Height: 5\' 11"  (1.803 m)   Weight: 293 lb (132.904 kg)    Constitutional: Vital signs reviewed.  Patient is a well-developed and well-nourished in no acute distress  and cooperative with exam. Alert and oriented x3.   Neck: Supple,  Cardiovascular: RRR, S1 normal, S2 normal, no MRG, pulses symmetric and intact bilaterally Pulmonary/Chest: CTAB, no wheezes, rales, or rhonchi Abdominal: Soft. Non-tender, non-distended, bowel sounds are normal, no masses, organomegaly, or guarding present.  GU: no CVA tenderness Musculoskeletal: No joint deformities, erythema, or stiffness, ROM full and no nontender Neurological: A&O x3, Strenght is normal and symmetric bilaterally, cranial nerve II-XII are grossly intact, no focal motor deficit, sensory intact to light touch bilaterally.  Skin: Warm, dry and intact. No rash, cyanosis, or clubbing.  Psychiatric: Normal mood and affect. speech and behavior is normal.

## 2011-03-28 NOTE — Assessment & Plan Note (Addendum)
Uncontrolled. After discussion with patient will start Byetta twice a day and will d/c Novolog. Will continue Lantus 100 units daily. I recommended to check his CBG daily before breakfast and the evening . We will see him one month for possible changes in management. [name] will follow up . Patient was recommend to call if he has any questions.

## 2011-03-28 NOTE — Assessment & Plan Note (Addendum)
Will continue current regimen.   BP Readings from Last 3 Encounters:  03/28/11 130/70  12/25/10 139/77  07/31/10 157/75

## 2011-03-28 NOTE — Assessment & Plan Note (Addendum)
Uncontrolled. Patient currently on pravastatin 80 mg daily and I wanted to iniate Gemfibrozil at the last office visit but patient was found to have acute renal failure. Fenofibrate is to expensive for him at this point. Will check creatinine function today and will consider to start therapy. Lab Results  Component Value Date   CHOL 243* 12/25/2010   HDL 43 12/25/2010   LDLCALC Comment:   Not calculated due to Triglyceride >400. Suggest ordering Direct LDL (Unit Code: 6804163946).   Total Cholesterol/HDL Ratio:CHD Risk                        Coronary Heart Disease Risk Table                                        Men       Women          1/2 Average Risk              3.4        3.3              Average Risk              5.0        4.4           2X Average Risk              9.6        7.1           3X Average Risk             23.4       11.0 Use the calculated Patient Ratio above and the CHD Risk table  to determine the patient's CHD Risk. ATP III Classification (LDL):       < 100        mg/dL         Optimal      100 - 129     mg/dL         Near or Above Optimal      130 - 159     mg/dL         Borderline High      160 - 189     mg/dL         High       > 190        mg/dL         Very High   12/25/2010   LDLDIRECT 247.6 06/18/2007   TRIG 417* 12/25/2010   CHOLHDL 5.7 12/25/2010

## 2011-03-28 NOTE — Assessment & Plan Note (Signed)
Patient was found to have an increase in creatinine  form  1.14 (05/2011) to 1.64 at the last office visit in 12/2010. Patient was asked to follow up in one week which he did not. He was informed to take only lasix 40 mg which he did and to take Lisinopril 20 mg which he did not instead he took 30 mg daily. Will recheck Bmet today for possible changes in management.

## 2011-03-28 NOTE — Assessment & Plan Note (Signed)
Patient was found to have a drop of Hgb form 14 to 12.4 a the last office visit in 12/2010. Patient was asked to follow up in one week which he did not. He underlined again that he is not experiencing any blood in the stool or in the urin. Will recheck CBC today.

## 2011-03-28 NOTE — Patient Instructions (Addendum)
1.Please check your sugars daily before breakfast and before bedtime. Call the clinic if you have always <70. Blood glucose level. 2. Stop taking Novolog.  3. Start taking Byetta.

## 2011-03-29 ENCOUNTER — Other Ambulatory Visit: Payer: Self-pay | Admitting: Internal Medicine

## 2011-03-29 DIAGNOSIS — E1129 Type 2 diabetes mellitus with other diabetic kidney complication: Secondary | ICD-10-CM

## 2011-04-02 ENCOUNTER — Other Ambulatory Visit: Payer: 59

## 2011-04-02 DIAGNOSIS — E1129 Type 2 diabetes mellitus with other diabetic kidney complication: Secondary | ICD-10-CM

## 2011-04-03 LAB — MICROALBUMIN / CREATININE URINE RATIO: Microalb Creat Ratio: 1980.4 mg/g — ABNORMAL HIGH (ref 0.0–30.0)

## 2011-04-12 DIAGNOSIS — H3340 Traction detachment of retina, unspecified eye: Secondary | ICD-10-CM | POA: Insufficient documentation

## 2011-04-12 DIAGNOSIS — E113513 Type 2 diabetes mellitus with proliferative diabetic retinopathy with macular edema, bilateral: Secondary | ICD-10-CM | POA: Insufficient documentation

## 2011-04-12 DIAGNOSIS — E113599 Type 2 diabetes mellitus with proliferative diabetic retinopathy without macular edema, unspecified eye: Secondary | ICD-10-CM | POA: Insufficient documentation

## 2011-05-01 ENCOUNTER — Other Ambulatory Visit: Payer: Self-pay | Admitting: Internal Medicine

## 2011-05-01 DIAGNOSIS — E785 Hyperlipidemia, unspecified: Secondary | ICD-10-CM

## 2011-05-02 ENCOUNTER — Other Ambulatory Visit: Payer: Self-pay | Admitting: Internal Medicine

## 2011-05-14 ENCOUNTER — Telehealth: Payer: Self-pay | Admitting: Dietician

## 2011-05-14 ENCOUNTER — Ambulatory Visit (INDEPENDENT_AMBULATORY_CARE_PROVIDER_SITE_OTHER): Payer: 59 | Admitting: Internal Medicine

## 2011-05-14 ENCOUNTER — Encounter: Payer: Self-pay | Admitting: Internal Medicine

## 2011-05-14 VITALS — BP 170/88 | HR 81 | Temp 97.7°F | Ht 71.0 in | Wt 298.9 lb

## 2011-05-14 DIAGNOSIS — E1129 Type 2 diabetes mellitus with other diabetic kidney complication: Secondary | ICD-10-CM

## 2011-05-14 DIAGNOSIS — D649 Anemia, unspecified: Secondary | ICD-10-CM

## 2011-05-14 DIAGNOSIS — Z23 Encounter for immunization: Secondary | ICD-10-CM

## 2011-05-14 DIAGNOSIS — E785 Hyperlipidemia, unspecified: Secondary | ICD-10-CM

## 2011-05-14 DIAGNOSIS — I10 Essential (primary) hypertension: Secondary | ICD-10-CM

## 2011-05-14 DIAGNOSIS — E119 Type 2 diabetes mellitus without complications: Secondary | ICD-10-CM

## 2011-05-14 LAB — LIPID PANEL
Cholesterol: 202 mg/dL — ABNORMAL HIGH (ref 0–200)
HDL: 49 mg/dL (ref >39)
LDL Cholesterol: 91 mg/dL (ref 0–99)
Total CHOL/HDL Ratio: 4.1 Ratio
Triglycerides: 312 mg/dL — ABNORMAL HIGH (ref <150)
VLDL: 62 mg/dL — ABNORMAL HIGH (ref 0–40)

## 2011-05-14 LAB — GLUCOSE, CAPILLARY: Glucose-Capillary: 212 mg/dL — ABNORMAL HIGH (ref 70–99)

## 2011-05-14 LAB — IRON AND TIBC: TIBC: 378 ug/dL (ref 215–435)

## 2011-05-14 LAB — BASIC METABOLIC PANEL
Chloride: 103 mEq/L (ref 96–112)
Potassium: 3.9 mEq/L (ref 3.5–5.3)

## 2011-05-14 LAB — VITAMIN B12: Vitamin B-12: 551 pg/mL (ref 211–911)

## 2011-05-14 LAB — FOLATE: Folate: 11.2 ng/mL

## 2011-05-14 MED ORDER — EXENATIDE 10 MCG/0.04ML ~~LOC~~ SOPN: 10.0000 ug | PEN_INJECTOR | Freq: Two times a day (BID) | SUBCUTANEOUS | Status: DC

## 2011-05-14 NOTE — Telephone Encounter (Signed)
Left a message for medical records at Dr. Payton Emerald office. Their recording said to allow 72 hours for request to be processed.

## 2011-05-14 NOTE — Progress Notes (Signed)
Subjective:   Patient ID: [name] male   DOB: [birth year] 55 y.o.   MRN: [id number]  HPI: [name] is a 55 y.o. male with PMH significant as outlined who presented to the clinic for a regular follow up. Patient was started on Byetta during the last office visit. Tolerating Byetta well. He noted that he has been checking his CBG on a regular basis . CBG mean 178, lowest 120, highest 294. Saw the ophtahlmologist last month. Patient has no concern.   Past Medical History  Diagnosis Date  . Diabetes mellitus   . Hypertension   . GERD (gastroesophageal reflux disease)   . Hyperlipemia   . Paroxysmal atrial tachycardia   . Cardiomyopathy    Current Outpatient Prescriptions  Medication Sig Dispense Refill  . acetaminophen (TYLENOL) 500 MG tablet Take 500 mg by mouth every 6 (six) hours as needed.        Marland Kitchen aspirin 81 MG tablet Take 81 mg by mouth daily.        . Blood Glucose Monitoring Suppl (ONE TOUCH ULTRA MINI) W/DEVICE KIT Use to check blood sugars during the day at work before and after lunch       . carvedilol (COREG) 25 MG tablet TAKE ONE TABLET BY MOUTH TWICE DAILY  60 tablet  3  . exenatide (BYETTA 5 MCG PEN) 5 MCG/0.02ML SOLN Inject 0.02 mLs (5 mcg total) into the skin 2 (two) times daily with a meal.  1.2 mL  3  . furosemide (LASIX) 40 MG tablet Take 1 tablet (40 mg total) by mouth daily.  30 tablet  3  . furosemide (LASIX) 80 MG tablet TAKE ONE TABLET BY MOUTH EVERY DAY  30 tablet  2  . Glucosamine-Chondroitin (OSTEO BI-FLEX REGULAR STRENGTH) 250-200 MG TABS Take 4 caplets per day with food       . glucose blood (ONE TOUCH ULTRA TEST) test strip Check blood sugars before each meal  100 each  5  . insulin glargine (LANTUS) 100 UNIT/ML injection Inject 100 units in the morning before breakfast ( take 50 units in 2 different injections).   10 mL  3  . Insulin Pen Needle 31G X 6 MM MISC Twice a day.  100 each  3  . Insulin Syringe-Needle U-100 (SUNMARK INSULIN SYR  .3CC/29G) 29G X 1/2" 0.3 ML MISC Use as directed.  200 each  3  . Lancets (ONETOUCH ULTRASOFT) lancets Use to test your blood sugar before each meal each day       . lisinopril (PRINIVIL,ZESTRIL) 30 MG tablet Take 1 tablet (30 mg total) by mouth daily.  30 tablet  3  . metFORMIN (GLUCOPHAGE) 1000 MG tablet TAKE ONE TABLET BY MOUTH TWICE DAILY  60 tablet  3  . Omega-3 Fatty Acids (FISH OIL) 1200 MG CAPS Take 2 capsules by mouth daily.        . pravastatin (PRAVACHOL) 40 MG tablet TAKE TWO TABLETS BY MOUTH IN THE EVENING  60 tablet  3  . pravastatin (PRAVACHOL) 80 MG tablet Take 80 mg by mouth every evening.        Marland Kitchen RELION INSULIN SYRINGE 29G X 1/2" 1 ML MISC USE AS DIRECTED  100 each  2  . tadalafil (CIALIS) 10 MG tablet Take 10 mg by mouth daily as needed. For sexual activity       . traMADol (ULTRAM) 50 MG tablet Take 1 tablet (50 mg total) by mouth 3 (three) times daily as needed.  For pain  30 tablet  3   Review of Systems: Constitutional: Denies fever, chills, diaphoresis, appetite change and fatigue.  Respiratory: Denies SOB, DOE, cough, chest tightness,  and wheezing.   Cardiovascular: Denies chest pain, palpitations and leg swelling.  Gastrointestinal: Denies nausea, vomiting, abdominal pain, diarrhea, constipation, blood in stool and abdominal distention.  Genitourinary: Denies dysuria, urgency, frequency, hematuria, flank pain and difficulty urinating.    Objective:  Physical Exam: Filed Vitals:   05/14/11 1527  BP: 180/92  Pulse: 83  Temp: 97.7 F (36.5 C)  TempSrc: Oral  Height: 5\' 11"  (1.803 m)  Weight: 298 lb 14.4 oz (135.58 kg)   Constitutional: Vital signs reviewed.  Patient is a well-developed and well-nourished  in no acute distress and cooperative with exam. Alert and oriented x3.  Neck: Supple,  Cardiovascular: RRR, S1 normal, S2 normal, no MRG, pulses symmetric and intact bilaterally Pulmonary/Chest: CTAB, no wheezes, rales, or rhonchi Abdominal: Soft.  Non-tender, non-distended, bowel sounds are normal GU: no CVA tenderness Neurological: A&O x3,  no focal motor deficit,Skin: Warm, dry and intact. No rash, cyanosis, or clubbing.  Psychiatric: Normal mood and affect. speech and behavior is normal. Judgment and thought content normal.

## 2011-05-15 NOTE — Assessment & Plan Note (Signed)
Triglyceride and Cholesterol has improved but continues to be elevated. LDL is at target. I may consider to start Lipitor 40 mg at the next office visit.

## 2011-05-15 NOTE — Assessment & Plan Note (Addendum)
Blood pressure was elevated during this office visit. I will have the patient come back in one week to reevaluate the blood pressure for possible changes in management. Consider to increase Lisinopril.  Patient need to have tight control of his blood pressure in the setting of CKD with proteinuria and Cardiomyopathy.

## 2011-05-15 NOTE — Assessment & Plan Note (Signed)
Will check Anemia panel today.

## 2011-05-15 NOTE — Assessment & Plan Note (Signed)
CBG mean 178, lowest 120, highest 294. Will increase Byetta to 62mcg and continue Lantus 100 units daily. Patient blood blood glucose level are improved significantly. Patient noted that he is eating a better diet and trying to walk but patient weight has increased. Encouraged to continue diet and exercise.  Patient need tight control of blood glucose considering her Diabetic Retinopathy and CKD.

## 2011-05-15 NOTE — Assessment & Plan Note (Signed)
Patient was noted to have worsening renal function during the last office visit with Micoralbumin/crea ratio of 1980 which is elevated from last year ( 1192). Patient need to have tight control of his blood pressure and diabetes. Will check bmet today.  Update. Creatinine improving. Will continue to monitor.

## 2011-05-16 NOTE — Telephone Encounter (Signed)
Exam report received  And given to medical records.

## 2011-05-22 ENCOUNTER — Ambulatory Visit (INDEPENDENT_AMBULATORY_CARE_PROVIDER_SITE_OTHER): Payer: 59 | Admitting: Internal Medicine

## 2011-05-22 ENCOUNTER — Other Ambulatory Visit: Payer: Self-pay | Admitting: Internal Medicine

## 2011-05-22 ENCOUNTER — Encounter: Payer: Self-pay | Admitting: Internal Medicine

## 2011-05-22 VITALS — BP 177/95 | HR 87 | Temp 97.5°F | Resp 20 | Ht 69.5 in | Wt 293.2 lb

## 2011-05-22 DIAGNOSIS — I10 Essential (primary) hypertension: Secondary | ICD-10-CM

## 2011-05-22 LAB — GLUCOSE, CAPILLARY: Glucose-Capillary: 228 mg/dL — ABNORMAL HIGH (ref 70–99)

## 2011-05-22 MED ORDER — AMLODIPINE BESYLATE 10 MG PO TABS: 10.0000 mg | ORAL_TABLET | Freq: Every day | ORAL | Status: DC

## 2011-05-22 NOTE — Progress Notes (Signed)
Subjective:   Patient ID: [name] male   DOB: August 23, 1955 55 y.o.   MRN: [id number]  HPI: [name] is a 55 y.o. man who presents to clinic for follow up from his last visit.  His blood pressure was elevated at his last appointment and he was told to watch the salt in his diet and return for a blood pressure recheck.  He states that he is taking his coreg twice daily, his lasix once daily, and his lisinopril daily.  He denies headache, nausea, vomiting, chest pain, or changes in vision.    Past Medical History  Diagnosis Date  . Diabetes mellitus   . Hypertension   . GERD (gastroesophageal reflux disease)   . Hyperlipemia   . Paroxysmal atrial tachycardia   . Cardiomyopathy    Current Outpatient Prescriptions  Medication Sig Dispense Refill  . acetaminophen (TYLENOL) 500 MG tablet Take 500 mg by mouth every 6 (six) hours as needed.        Marland Kitchen aspirin 81 MG tablet Take 81 mg by mouth daily.        . Blood Glucose Monitoring Suppl (ONE TOUCH ULTRA MINI) W/DEVICE KIT Use to check blood sugars during the day at work before and after lunch       . carvedilol (COREG) 25 MG tablet TAKE ONE TABLET BY MOUTH TWICE DAILY  60 tablet  3  . exenatide (BYETTA 10 MCG PEN) 10 MCG/0.04ML SOLN Inject 0.04 mLs (10 mcg total) into the skin 2 (two) times daily with a meal.  1.2 mL  3  . furosemide (LASIX) 80 MG tablet TAKE ONE TABLET BY MOUTH EVERY DAY  30 tablet  2  . Glucosamine-Chondroitin (OSTEO BI-FLEX REGULAR STRENGTH) 250-200 MG TABS Take 4 caplets per day with food       . glucose blood (ONE TOUCH ULTRA TEST) test strip Check blood sugars before each meal  100 each  5  . insulin glargine (LANTUS) 100 UNIT/ML injection Inject 100 units in the morning before breakfast ( take 50 units in 2 different injections).   10 mL  3  . Insulin Pen Needle 31G X 6 MM MISC Twice a day.  100 each  3  . Insulin Syringe-Needle U-100 (SUNMARK INSULIN SYR .3CC/29G) 29G X 1/2" 0.3 ML MISC Use as directed.  200  each  3  . Lancets (ONETOUCH ULTRASOFT) lancets Use to test your blood sugar before each meal each day       . lisinopril (PRINIVIL,ZESTRIL) 30 MG tablet Take 1 tablet (30 mg total) by mouth daily.  30 tablet  3  . metFORMIN (GLUCOPHAGE) 1000 MG tablet TAKE ONE TABLET BY MOUTH TWICE DAILY  60 tablet  3  . Omega-3 Fatty Acids (FISH OIL) 1200 MG CAPS Take 2 capsules by mouth daily.        . pravastatin (PRAVACHOL) 80 MG tablet Take 80 mg by mouth every evening.        Marland Kitchen RELION INSULIN SYRINGE 29G X 1/2" 1 ML MISC USE AS DIRECTED  100 each  2  . tadalafil (CIALIS) 10 MG tablet Take 10 mg by mouth daily as needed. For sexual activity       . traMADol (ULTRAM) 50 MG tablet Take 1 tablet (50 mg total) by mouth 3 (three) times daily as needed. For pain  30 tablet  3   Family History  Problem Relation Age of Onset  . Diabetes Mother   . Diabetes Brother  History   Social History  . Marital Status: Single    Spouse Name: N/A    Number of Children: N/A  . Years of Education: N/A   Social History Main Topics  . Smoking status: Never Smoker   . Smokeless tobacco: None  . Alcohol Use: No  . Drug Use: No  . Sexually Active: None   Other Topics Concern  . None   Social History Narrative  . None   Review of Systems: Constitutional: Denies fever, chills, diaphoresis, appetite change and fatigue.  HEENT: Denies photophobia, eye pain, redness, hearing loss, ear pain, congestion, sore throat, rhinorrhea, sneezing, mouth sores, trouble swallowing, neck pain, neck stiffness and tinnitus.   Respiratory: Denies SOB, DOE, cough, chest tightness,  and wheezing.   Cardiovascular: Denies chest pain, palpitations and leg swelling.  Gastrointestinal: Denies nausea, vomiting, abdominal pain, diarrhea, constipation, blood in stool and abdominal distention.  Genitourinary: Denies dysuria, urgency, frequency, hematuria, flank pain and difficulty urinating.  Musculoskeletal: Denies myalgias, back pain,  joint swelling, arthralgias and gait problem.  Skin: Denies pallor, rash and wound.  Neurological: Denies dizziness, seizures, syncope, weakness, light-headedness, numbness and headaches.  Hematological: Denies adenopathy. Easy bruising, personal or family bleeding history  Psychiatric/Behavioral: Denies suicidal ideation, mood changes, confusion, nervousness, sleep disturbance and agitation  Objective:  Physical Exam: Filed Vitals:   05/22/11 1534  BP: 177/95  Pulse: 87  Temp: 97.5 F (36.4 C)  TempSrc: Oral  Resp: 20  Height: 5' 9.5" (1.765 m)  Weight: 293 lb 3.2 oz (132.995 kg)   Constitutional: Vital signs reviewed.  Patient is a well-developed and well-nourished man in no acute distress and cooperative with exam. Alert and oriented x3.  Head: Normocephalic and atraumatic Ear: TM normal bilaterally Mouth: no erythema or exudates, MMM Eyes: PERRL, EOMI, conjunctivae normal, No scleral icterus.  Neck: Supple, Trachea midline normal ROM, No JVD, mass, thyromegaly, or carotid bruit present.  Cardiovascular: RRR, S1 normal, S2 normal, no MRG, pulses symmetric and intact bilaterally Pulmonary/Chest: CTAB, no wheezes, rales, or rhonchi Abdominal: Soft. Non-tender, non-distended, bowel sounds are normal, no masses, organomegaly, or guarding present.  GU: no CVA tenderness Musculoskeletal: No joint deformities, erythema, or stiffness, ROM full and no nontender Hematology: no cervical, inginal, or axillary adenopathy.  Neurological: A&O x3, Strength is normal and symmetric bilaterally, cranial nerve II-XII are grossly intact, no focal motor deficit, sensory intact to light touch bilaterally.  Skin: Warm, dry and intact. No rash, cyanosis, or clubbing.  Psychiatric: Normal mood and affect. speech and behavior is normal. Judgment and thought content normal. Cognition and memory are normal.   Assessment & Plan:

## 2011-05-22 NOTE — Patient Instructions (Signed)
1.  Start Amlodipine 10 mg tablets take 1 tablet daily for your blood pressure.  2.  Take your blood pressure at a pharmacy and write it down.  Bring that with you to your follow up.  If you have any dizziness on standing or light headedness please call us.  3.  Continue all of your other medications as prescribed.  4.  Follow up in 2 weeks to recheck your blood pressure.

## 2011-05-31 ENCOUNTER — Other Ambulatory Visit: Payer: Self-pay | Admitting: Internal Medicine

## 2011-06-06 ENCOUNTER — Ambulatory Visit (INDEPENDENT_AMBULATORY_CARE_PROVIDER_SITE_OTHER): Payer: 59 | Admitting: Internal Medicine

## 2011-06-06 ENCOUNTER — Encounter: Payer: Self-pay | Admitting: Internal Medicine

## 2011-06-06 ENCOUNTER — Encounter: Payer: Self-pay | Admitting: *Deleted

## 2011-06-06 VITALS — BP 138/78 | HR 78 | Temp 97.7°F | Ht 69.5 in | Wt 283.8 lb

## 2011-06-06 DIAGNOSIS — I10 Essential (primary) hypertension: Secondary | ICD-10-CM

## 2011-06-06 DIAGNOSIS — R059 Cough, unspecified: Secondary | ICD-10-CM

## 2011-06-06 DIAGNOSIS — R05 Cough: Secondary | ICD-10-CM

## 2011-06-06 DIAGNOSIS — E119 Type 2 diabetes mellitus without complications: Secondary | ICD-10-CM

## 2011-06-06 LAB — POCT GLYCOSYLATED HEMOGLOBIN (HGB A1C): Hemoglobin A1C: 9.4

## 2011-06-06 NOTE — Progress Notes (Signed)
Subjective:   Patient ID: DRACEN FLUHARTY male   DOB: 17-Jan-1956 56 y.o.   MRN: [id number]  HPI: Mr.Clois B Haba is a 56 y.o. male with past history significant as outlined below who presented to the clinic for a regular office visit to followup on his blood pressure and diabetes. Patient noted that he has been doing fine. He was started on Norvasc 10 mg during the last office visit. He reports that he has been experiencing some nasal congestion and cough since one week. The cough is nonproductive. Denies any shortness of breath, fevers or chills or sick contact. He has been take checking his blood sugars on a daily basis and is tolerating diet very well.    Past Medical History  Diagnosis Date  . Diabetes mellitus   . Hypertension   . GERD (gastroesophageal reflux disease)   . Hyperlipemia   . Paroxysmal atrial tachycardia   . Cardiomyopathy    Current Outpatient Prescriptions  Medication Sig Dispense Refill  . acetaminophen (TYLENOL) 500 MG tablet Take 500 mg by mouth every 6 (six) hours as needed.        Marland Kitchen amLODipine (NORVASC) 10 MG tablet Take 1 tablet (10 mg total) by mouth daily.  30 tablet  11  . aspirin 81 MG tablet Take 81 mg by mouth daily.        . Blood Glucose Monitoring Suppl (ONE TOUCH ULTRA MINI) W/DEVICE KIT Use to check blood sugars during the day at work before and after lunch       . carvedilol (COREG) 25 MG tablet TAKE ONE TABLET BY MOUTH TWICE DAILY  60 tablet  3  . exenatide (BYETTA 10 MCG PEN) 10 MCG/0.04ML SOLN Inject 0.04 mLs (10 mcg total) into the skin 2 (two) times daily with a meal.  1.2 mL  3  . furosemide (LASIX) 80 MG tablet TAKE ONE TABLET BY MOUTH EVERY DAY  30 tablet  2  . Glucosamine-Chondroitin (OSTEO BI-FLEX REGULAR STRENGTH) 250-200 MG TABS Take 4 caplets per day with food       . glucose blood (ONE TOUCH ULTRA TEST) test strip Check blood sugars before each meal  100 each  5  . insulin glargine (LANTUS) 100 UNIT/ML injection Inject 100 units in  the morning before breakfast ( take 50 units in 2 different injections).   10 mL  3  . Insulin Pen Needle 31G X 6 MM MISC Twice a day.  100 each  3  . Insulin Syringe-Needle U-100 (SUNMARK INSULIN SYR .3CC/29G) 29G X 1/2" 0.3 ML MISC Use as directed.  200 each  3  . Lancets (ONETOUCH ULTRASOFT) lancets Use to test your blood sugar before each meal each day       . lisinopril (PRINIVIL,ZESTRIL) 30 MG tablet Take 1 tablet (30 mg total) by mouth daily.  30 tablet  3  . metFORMIN (GLUCOPHAGE) 1000 MG tablet TAKE ONE TABLET BY MOUTH TWICE DAILY  60 tablet  3  . Omega-3 Fatty Acids (FISH OIL) 1200 MG CAPS Take 2 capsules by mouth daily.        . pravastatin (PRAVACHOL) 80 MG tablet Take 80 mg by mouth every evening.        Marland Kitchen RELION INSULIN SYRINGE 29G X 1/2" 1 ML MISC USE AS DIRECTED  100 each  2  . RELION PEN NEEDLE 31G/8MM 31G X 8 MM MISC USE AS DIRECTED TWICE DAILY  100 each  3  . tadalafil (CIALIS) 10 MG tablet  Take 10 mg by mouth daily as needed. For sexual activity       . traMADol (ULTRAM) 50 MG tablet Take 1 tablet (50 mg total) by mouth 3 (three) times daily as needed. For pain  30 tablet  3   Family History  Problem Relation Age of Onset  . Diabetes Mother   . Diabetes Brother    History   Social History  . Marital Status: Single    Spouse Name: N/A    Number of Children: N/A  . Years of Education: N/A   Social History Main Topics  . Smoking status: Never Smoker   . Smokeless tobacco: None  . Alcohol Use: No  . Drug Use: No  . Sexually Active: None   Other Topics Concern  . None   Social History Narrative  . None   Review of Systems: Constitutional: Denies fever, chills, diaphoresis, appetite change and fatigue.  HEENT:Noted congestion,rhinorrhea, sneezing, but denies mouth sores, trouble swallowing, neck pain, neck stiffness and tinnitus.   Respiratory: Denies SOB, DOE,  chest tightness,  and wheezing.   Cardiovascular: Denies chest pain, palpitations and leg  swelling.  Gastrointestinal: Denies nausea, vomiting, abdominal pain, diarrhea, constipation,   Objective:  Physical Exam: Filed Vitals:   06/06/11 1453  BP: 138/78  Pulse: 78  Temp: 97.7 F (36.5 C)  TempSrc: Oral  Height: 5' 9.5" (1.765 m)  Weight: 283 lb 12.8 oz (128.731 kg)  SpO2: 98%   Constitutional: Vital signs reviewed.  Patient is a well-developed and well-nourished  in no acute distress and cooperative with exam. Alert and oriented x3. Neck: Supple,  Cardiovascular: RRR, S1 normal, S2 normal, no MRG, pulses symmetric and intact bilaterally, 1+pitting edema bilateral  Pulmonary/Chest: CTAB, no wheezes, rales, or rhonchi Abdominal: Soft. Non-tender, non-distended, bowel sounds are normal,  Neurological: A&O x3, no focal motor deficit,

## 2011-06-06 NOTE — Assessment & Plan Note (Signed)
Blood pressure significantly improved after starting Norvasc 10 mg daily. I will continue current regimen with lisinopril, Corag, Lasix and Norvasc. Considering patient's comorbidities he needs tight blood pressure control.

## 2011-06-06 NOTE — Assessment & Plan Note (Signed)
Most likely viral etiology. Recommended Mucinex if needed. Advised the patient noticed worsening cough, shortness of breath and fevers and chills to call the clinic for reevaluation.

## 2011-06-06 NOTE — Assessment & Plan Note (Signed)
The patient's diabetes is better controlled on Byetta and Lantus insulin and metformin. Hemoglobin A1c is down from 10.9 to 9.4 today. Patient's blood sugars are better controlled with highest numbers in the 417 range, median 167, though his 97. I will continue current regimen and congratulated the patient for the improvement in his diabetes control. In the setting of retinopathy neuropathy and nephropathy patient needs very tight control.

## 2011-06-08 ENCOUNTER — Encounter: Payer: Self-pay | Admitting: Internal Medicine

## 2011-06-08 ENCOUNTER — Ambulatory Visit (INDEPENDENT_AMBULATORY_CARE_PROVIDER_SITE_OTHER): Payer: 59 | Admitting: Internal Medicine

## 2011-06-08 DIAGNOSIS — I10 Essential (primary) hypertension: Secondary | ICD-10-CM

## 2011-06-08 DIAGNOSIS — I471 Supraventricular tachycardia, unspecified: Secondary | ICD-10-CM

## 2011-06-08 DIAGNOSIS — I428 Other cardiomyopathies: Secondary | ICD-10-CM

## 2011-06-08 NOTE — Patient Instructions (Signed)
Your physician wants you to follow-up in: 2 years with Dr. Caryl Comes. You will receive a reminder letter in the mail two months in advance. If you don't receive a letter, please call our office to schedule the follow-up appointment.  Your physician recommends that you continue on your current medications as directed. Please refer to the Current Medication list given to you today.

## 2011-06-08 NOTE — Assessment & Plan Note (Signed)
No clinical recurrences continue current meds

## 2011-06-08 NOTE — Progress Notes (Signed)
HPI  [name] is a 56 y.o. 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  He also has multiple cardiac risk factors including diabetes and hypertension and morbid obesity  Echo 2011 EF 55%.   .  Review of recent outpatient clinic records demonstrated that an attempt to increase his ACE inhibitor resulted in significant increase in his creatinine from 1.22---2.3 range.  Last assessment since resumption was in oct 2012 Cr was 1.34 still on his ACE   Past Medical History  Diagnosis Date  . Diabetes mellitus   . Hypertension   . GERD (gastroesophageal reflux disease)   . Hyperlipemia   . Paroxysmal atrial tachycardia   . Cardiomyopathy     Past Surgical History  Procedure Date  . Cardiac catheterization Dec 2007    normal; EF- 20-25%    Current Outpatient Prescriptions  Medication Sig Dispense Refill  . acetaminophen (TYLENOL) 500 MG tablet Take 500 mg by mouth every 6 (six) hours as needed.        Marland Kitchen amLODipine (NORVASC) 10 MG tablet Take 1 tablet (10 mg total) by mouth daily.  30 tablet  11  . aspirin 81 MG tablet Take 81 mg by mouth daily.        . Blood Glucose Monitoring Suppl (ONE TOUCH ULTRA MINI) W/DEVICE KIT Use to check blood sugars during the day at work before and after lunch       . carvedilol (COREG) 25 MG tablet TAKE ONE TABLET BY MOUTH TWICE DAILY  60 tablet  3  . exenatide (BYETTA 10 MCG PEN) 10 MCG/0.04ML SOLN Inject 0.04 mLs (10 mcg total) into the skin 2 (two) times daily with a meal.  1.2 mL  3  . glucose blood (ONE TOUCH ULTRA TEST) test strip Check blood sugars before each meal  100 each  5  . insulin glargine (LANTUS) 100 UNIT/ML injection Inject 100 units in the morning before breakfast ( take 50 units in 2 different injections).   10 mL  3  . Insulin Pen Needle 31G X 6 MM MISC Twice a day.  100 each  3  . Insulin Syringe-Needle U-100 (SUNMARK INSULIN SYR .3CC/29G) 29G X 1/2" 0.3  ML MISC Use as directed.  200 each  3  . Lancets (ONETOUCH ULTRASOFT) lancets Use to test your blood sugar before each meal each day       . lisinopril (PRINIVIL,ZESTRIL) 30 MG tablet Take 1 tablet (30 mg total) by mouth daily.  30 tablet  3  . metFORMIN (GLUCOPHAGE) 1000 MG tablet TAKE ONE TABLET BY MOUTH TWICE DAILY  60 tablet  3  . Omega-3 Fatty Acids (FISH OIL) 1200 MG CAPS Take 2 capsules by mouth daily.        . pravastatin (PRAVACHOL) 80 MG tablet Take 80 mg by mouth every evening.        Marland Kitchen RELION INSULIN SYRINGE 29G X 1/2" 1 ML MISC USE AS DIRECTED  100 each  2  . RELION PEN NEEDLE 31G/8MM 31G X 8 MM MISC USE AS DIRECTED TWICE DAILY  100 each  3  . tadalafil (CIALIS) 10 MG tablet Take 10 mg by mouth daily as needed. For sexual activity       . traMADol (ULTRAM) 50 MG tablet Take 1 tablet (50 mg total) by mouth 3 (three) times daily as needed. For pain  30 tablet  3  . furosemide (LASIX) 80 MG  tablet TAKE ONE TABLET BY MOUTH EVERY DAY  30 tablet  2  . Glucosamine-Chondroitin (OSTEO BI-FLEX REGULAR STRENGTH) 250-200 MG TABS Take 4 caplets per day with food         No Known Allergies  Review of Systems negative except from HPI and PMH  Physical Exam Well developed and morbidly obese in no acute distress HENT normal E scleral and icterus clear Neck Supple JVP flat; carotids brisk and full Clear to ausculation Regular rate and rhythm, no murmurs gallops or rub Soft with active bowel sounds No clubbing cyanosis Trace Edema Alert and oriented, grossly normal motor and sensory function Skin Warm and Dry  Electrocardiogram dated today demonstrates sinus rhythm at 78 Intervals 0.15/0.09/0.38 Axis is -24  Assessment and  Plan

## 2011-06-08 NOTE — Assessment & Plan Note (Signed)
Resolved; continue current medications

## 2011-06-08 NOTE — Assessment & Plan Note (Signed)
Well-controlled on current meds 

## 2011-06-11 ENCOUNTER — Telehealth: Payer: Self-pay | Admitting: *Deleted

## 2011-06-11 NOTE — Telephone Encounter (Signed)
Pt calls to say he has been sick and feverish for over a week, will schedule appt, call [phone] for tues or wed

## 2011-06-13 ENCOUNTER — Ambulatory Visit (INDEPENDENT_AMBULATORY_CARE_PROVIDER_SITE_OTHER): Payer: 59 | Admitting: Internal Medicine

## 2011-06-13 ENCOUNTER — Encounter: Payer: Self-pay | Admitting: Internal Medicine

## 2011-06-13 ENCOUNTER — Other Ambulatory Visit: Payer: Self-pay | Admitting: Internal Medicine

## 2011-06-13 DIAGNOSIS — R05 Cough: Secondary | ICD-10-CM

## 2011-06-13 DIAGNOSIS — R059 Cough, unspecified: Secondary | ICD-10-CM

## 2011-06-13 LAB — GLUCOSE, CAPILLARY: Glucose-Capillary: 207 mg/dL — ABNORMAL HIGH (ref 70–99)

## 2011-06-13 NOTE — Assessment & Plan Note (Signed)
Improving with conservative therapy including Tylenol and Mucinex. Most likely viral etiology. Recommended to continue current treatment. Advised if he is experiencing worsening symptoms to call the clinic for further recommendations and management.

## 2011-06-13 NOTE — Patient Instructions (Signed)
1.Take Tylenol if needed for fever or aching pain 2. Take any ibuprofen, Aleve or any other nonsteroidal since your kidney function is decreased 3. Keep out of cold weather

## 2011-06-13 NOTE — Progress Notes (Signed)
Subjective:   Patient ID: YONIC GUYMON male   DOB: [birth year] 56 y.o.   MRN: [id number]  HPI: Mr.Jakari B Burges is a 56 y.o. male with past medical history significant as outlined below who presented to the clinic with cough. Patient has been experiencing since beginning of this month cough and reported after starting the Mucinex symptoms has improved. He still feels tired but he thinks he is getting better. He denies any productive cough, shortness of breath, throat pain, trouble swallowing or fevers or chills although he has not checked his temperature. 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    Past Medical History  Diagnosis Date  . Diabetes mellitus   . Hypertension   . GERD (gastroesophageal reflux disease)   . Hyperlipemia   . Paroxysmal atrial tachycardia   . Cardiomyopathy     Resolved with EF 55% 2011   Current Outpatient Prescriptions  Medication Sig Dispense Refill  . acetaminophen (TYLENOL) 500 MG tablet Take 500 mg by mouth every 6 (six) hours as needed.        Marland Kitchen amLODipine (NORVASC) 10 MG tablet Take 1 tablet (10 mg total) by mouth daily.  30 tablet  11  . aspirin 81 MG tablet Take 81 mg by mouth daily.        . Blood Glucose Monitoring Suppl (ONE TOUCH ULTRA MINI) W/DEVICE KIT Use to check blood sugars during the day at work before and after lunch       . carvedilol (COREG) 25 MG tablet TAKE ONE TABLET BY MOUTH TWICE DAILY  60 tablet  3  . exenatide (BYETTA 10 MCG PEN) 10 MCG/0.04ML SOLN Inject 0.04 mLs (10 mcg total) into the skin 2 (two) times daily with a meal.  1.2 mL  3  . furosemide (LASIX) 80 MG tablet TAKE ONE TABLET BY MOUTH EVERY DAY  30 tablet  2  . Glucosamine-Chondroitin (OSTEO BI-FLEX REGULAR STRENGTH) 250-200 MG TABS Take 4 caplets per day with food       . glucose blood (ONE TOUCH ULTRA TEST) test strip Check blood sugars before each meal  100 each  5  . insulin glargine (LANTUS) 100 UNIT/ML injection  Inject 100 units in the morning before breakfast ( take 50 units in 2 different injections).   10 mL  3  . Insulin Pen Needle 31G X 6 MM MISC Twice a day.  100 each  3  . Insulin Syringe-Needle U-100 (SUNMARK INSULIN SYR .3CC/29G) 29G X 1/2" 0.3 ML MISC Use as directed.  200 each  3  . Lancets (ONETOUCH ULTRASOFT) lancets Use to test your blood sugar before each meal each day       . lisinopril (PRINIVIL,ZESTRIL) 30 MG tablet Take 1 tablet (30 mg total) by mouth daily.  30 tablet  3  . metFORMIN (GLUCOPHAGE) 1000 MG tablet TAKE ONE TABLET BY MOUTH TWICE DAILY  60 tablet  3  . Omega-3 Fatty Acids (FISH OIL) 1200 MG CAPS Take 2 capsules by mouth daily.        . pravastatin (PRAVACHOL) 80 MG tablet Take 80 mg by mouth every evening.        Marland Kitchen RELION INSULIN SYRINGE 29G X 1/2" 1 ML MISC USE AS DIRECTED  100 each  2  . RELION PEN NEEDLE 31G/8MM 31G X 8 MM MISC USE AS DIRECTED TWICE DAILY  100 each  3  . tadalafil (CIALIS)  10 MG tablet Take 10 mg by mouth daily as needed. For sexual activity       . traMADol (ULTRAM) 50 MG tablet Take 1 tablet (50 mg total) by mouth 3 (three) times daily as needed. For pain  30 tablet  3   Review of Systems: Constitutional: Denies fever, chills, diaphoresis, appetite change and fatigue.  HEENT: Noted  congestion, , rhinorrhea, sneezing. Denies trouble swallowing, neck pain, neck stiffness and tinnitus.   Respiratory: Denies SOB, DOE, cough, chest tightness,  and wheezing.   Cardiovascular: Denies chest pain, palpitations and leg swelling.  Gastrointestinal: Denies nausea, vomiting, abdominal pain, diarrhea, constipation.  Skin: Denies pallor, rash and wound.  Neurological: Denies dizziness,  light-headedness,  and headaches.    Objective:  Physical Exam: Filed Vitals:   06/13/11 1414  BP: 146/78  Pulse: 90  Temp: 98.9 F (37.2 C)  TempSrc: Oral  Height: 5' 9.5" (1.765 m)  Weight: 283 lb (128.368 kg)   Constitutional: Vital signs reviewed.  Patient is a  well-developed and well-nourished ,  in no acute distress and cooperative with exam. Alert and oriented x3.  Ear: TM normal bilaterally Mouth: no erythema or exudates, MMM Neck: Supple,  Cardiovascular: RRR, S1 normal, S2 normal,  pulses symmetric and intact bilaterally Pulmonary/Chest: CTAB, no wheezes, rales, or rhonchi Abdominal: Soft. Non-tender, non-distended, bowel sounds are normal, .  Hematology: no cervicaladenopathy.  Neurological: A&O x3,  no focal motor deficit,  Skin: Warm, dry and intact. No rash.

## 2011-06-18 NOTE — Assessment & Plan Note (Signed)
Lab Results  Component Value Date   NA 141 05/14/2011   K 3.9 05/14/2011   CL 103 05/14/2011   CO2 25 05/14/2011   BUN 21 05/14/2011   CREATININE 1.34 05/14/2011   CREATININE 1.14 05/30/2010   BP: 177/95  Assessment: Hypertension control:  moderately elevated  Progress toward goals:  deteriorated Barriers to meeting goals:  no barriers identified  Plan: Hypertension treatment:  continue current medications His blood pressure continues to be elevated.  He states that he is taking his medication.  We will start Norvasc 10 mg daily and have him come back in 1-2 weeks to recheck his blood pressure.

## 2011-06-30 ENCOUNTER — Other Ambulatory Visit: Payer: Self-pay | Admitting: Internal Medicine

## 2011-06-30 DIAGNOSIS — I10 Essential (primary) hypertension: Secondary | ICD-10-CM

## 2011-07-09 ENCOUNTER — Encounter: Payer: Self-pay | Admitting: Internal Medicine

## 2011-07-09 ENCOUNTER — Ambulatory Visit (INDEPENDENT_AMBULATORY_CARE_PROVIDER_SITE_OTHER): Payer: 59 | Admitting: Internal Medicine

## 2011-07-09 DIAGNOSIS — E119 Type 2 diabetes mellitus without complications: Secondary | ICD-10-CM

## 2011-07-09 DIAGNOSIS — I10 Essential (primary) hypertension: Secondary | ICD-10-CM

## 2011-07-09 DIAGNOSIS — M1990 Unspecified osteoarthritis, unspecified site: Secondary | ICD-10-CM

## 2011-07-09 MED ORDER — TRAMADOL HCL 50 MG PO TABS: 50.0000 mg | ORAL_TABLET | Freq: Three times a day (TID) | ORAL | Status: DC | PRN

## 2011-07-09 NOTE — Assessment & Plan Note (Signed)
Blood pressure well controlled. Continue current regimen.  

## 2011-07-09 NOTE — Assessment & Plan Note (Signed)
Reviewing patient's glucose meter CBGs elevated. Patient reports significant stressors during  January upper respiratory infection. At this point  I'm not changing anything in management for now and will continue to monitor.  Reevaluate during next office visit for possible changes.

## 2011-07-09 NOTE — Assessment & Plan Note (Signed)
Patient noted during cold with the season pain is aggravated. I recommended he could take Tylenol 500 mg every 6 hours as needed for pain and tramadol.

## 2011-07-09 NOTE — Progress Notes (Signed)
Subjective:   Patient ID: [name] male   DOB: [birth year] 57 y.o.   MRN: [id number]  HPI: [name] is a 56 y.o. male with past bases significant as outlined below presented to the clinic for regular office visit. Patient reports he is doing fine except that the weather causing trouble with his joints. The pain is worse in his ankles and his knee especially at the left. He further reports that he is more active during this spring and summer. He does a lot of exercises which he is currently not doing  Past Medical History  Diagnosis Date  . Diabetes mellitus   . Hypertension   . GERD (gastroesophageal reflux disease)   . Hyperlipemia   . Paroxysmal atrial tachycardia   . Cardiomyopathy     Resolved with EF 55% 2011  . CARDIOMYOPATHY 04/22/2006  . DIABETIC  RETINOPATHY 02/06/2007  . NEPHROPATHY, DIABETIC 06/08/2006   Current Outpatient Prescriptions  Medication Sig Dispense Refill  . acetaminophen (TYLENOL) 500 MG tablet Take 500 mg by mouth every 6 (six) hours as needed.        Marland Kitchen amLODipine (NORVASC) 10 MG tablet Take 1 tablet (10 mg total) by mouth daily.  30 tablet  11  . aspirin 81 MG tablet Take 81 mg by mouth daily.        . Blood Glucose Monitoring Suppl (ONE TOUCH ULTRA MINI) W/DEVICE KIT Use to check blood sugars during the day at work before and after lunch       . carvedilol (COREG) 25 MG tablet TAKE ONE TABLET BY MOUTH TWICE DAILY  60 tablet  3  . exenatide (BYETTA 10 MCG PEN) 10 MCG/0.04ML SOLN Inject 0.04 mLs (10 mcg total) into the skin 2 (two) times daily with a meal.  1.2 mL  3  . furosemide (LASIX) 80 MG tablet TAKE ONE TABLET BY MOUTH EVERY DAY  30 tablet  2  . Glucosamine-Chondroitin (OSTEO BI-FLEX REGULAR STRENGTH) 250-200 MG TABS Take 4 caplets per day with food       . glucose blood (ONE TOUCH ULTRA TEST) test strip Check blood sugars before each meal  100 each  5  . Insulin Pen Needle 31G X 6 MM MISC Twice a day.  100 each  3  . Insulin Syringe-Needle  U-100 (SUNMARK INSULIN SYR .3CC/29G) 29G X 1/2" 0.3 ML MISC Use as directed.  200 each  3  . Lancets (ONETOUCH ULTRASOFT) lancets Use to test your blood sugar before each meal each day       . LANTUS 100 UNIT/ML injection INJECT 110 UNITS ONCE DAILY IN THE MORNING. YOU CAN SPLIT 55 UNITS AND 55 UNITS AND INJECT AT TWO LOCATIONS  50 mL  3  . lisinopril (PRINIVIL,ZESTRIL) 30 MG tablet Take 1 tablet (30 mg total) by mouth daily.  30 tablet  3  . lisinopril (PRINIVIL,ZESTRIL) 30 MG tablet TAKE ONE TABLET BY MOUTH EVERY DAY  30 tablet  2  . metFORMIN (GLUCOPHAGE) 1000 MG tablet TAKE ONE TABLET BY MOUTH TWICE DAILY  60 tablet  3  . Omega-3 Fatty Acids (FISH OIL) 1200 MG CAPS Take 2 capsules by mouth daily.        . pravastatin (PRAVACHOL) 80 MG tablet Take 80 mg by mouth every evening.        Marland Kitchen RELION INSULIN SYRINGE 29G X 1/2" 1 ML MISC USE AS DIRECTED  100 each  2  . RELION PEN NEEDLE 31G/8MM 31G X 8 MM  MISC USE AS DIRECTED TWICE DAILY  100 each  3  . tadalafil (CIALIS) 10 MG tablet Take 10 mg by mouth daily as needed. For sexual activity       . traMADol (ULTRAM) 50 MG tablet Take 1 tablet (50 mg total) by mouth 3 (three) times daily as needed. For pain  30 tablet  3   Family History  Problem Relation Age of Onset  . Diabetes Mother   . Diabetes Brother    History   Social History  . Marital Status: Single    Spouse Name: N/A    Number of Children: N/A  . Years of Education: N/A   Social History Main Topics  . Smoking status: Never Smoker   . Smokeless tobacco: None  . Alcohol Use: No  . Drug Use: No  . Sexually Active: None   Other Topics Concern  . None   Social History Narrative  . None   Review of Systems: Constitutional: Denies fever, chills, diaphoresis, appetite change and fatigue.  Respiratory: Denies SOB, DOE, cough, chest tightness,  and wheezing.   Cardiovascular: Denies chest pain, palpitations and leg swelling.  Gastrointestinal: Denies nausea, vomiting, abdominal  pain, diarrhea, constipation, blood in stool and abdominal distention.  Genitourinary: Denies dysuria, urgency, frequency, hematuria, flank pain and difficulty urinating.  Musculoskeletal: Noted myalgias, back pain, joint swelling, arthralgias .  Skin: Denies pallor, rash and wound.  Neurological: Denies dizziness, .    Objective:  Physical Exam: Filed Vitals:   07/09/11 1545  BP: 132/72  Pulse: 75  Temp: 98 F (36.7 C)  TempSrc: Oral  Height: 5' 9.5" (1.765 m)  Weight: 281 lb 1.6 oz (127.506 kg)  SpO2: 98%   Constitutional: Vital signs reviewed.  Patient is a well-developed and well-nourished  in no acute distress and cooperative with exam. Alert and oriented x3.  Neck: Supple,  Cardiovascular: RRR, S1 normal, S2 normal, pulses symmetric and intact bilaterally Pulmonary/Chest: CTAB, no wheezes, rales, or rhonchi Abdominal: Soft. Non-tender, non-distended, bowel sounds are normal,  Musculoskeletal: No joint deformities, erythema, or stiffness, mild decreased ROM due to pain in knee and ankle    Neurological: A&O x3, Strenght is normal and symmetric bilaterally,  no focal motor deficit, sensory intact to light touch bilaterally.  Skin: Warm, dry and intact.

## 2011-07-30 ENCOUNTER — Telehealth: Payer: Self-pay | Admitting: Dietician

## 2011-08-03 NOTE — Telephone Encounter (Signed)
Patient scheduled for 8:30 am on 08/09/11 with CDE

## 2011-08-09 ENCOUNTER — Ambulatory Visit (INDEPENDENT_AMBULATORY_CARE_PROVIDER_SITE_OTHER): Payer: 59 | Admitting: Dietician

## 2011-08-09 ENCOUNTER — Encounter: Payer: Self-pay | Admitting: Dietician

## 2011-08-09 VITALS — Ht 69.5 in | Wt 282.7 lb

## 2011-08-09 DIAGNOSIS — E119 Type 2 diabetes mellitus without complications: Secondary | ICD-10-CM

## 2011-08-09 NOTE — Progress Notes (Signed)
Medical Nutrition Therapy  Visit 1:08/09/2011 Time in: 8:20 am, Time out: 9:05 am [name], identified by name and date of birth, is a 56 y.o. male with Type 2 Diabetes. ASSESSMENT Patient concerns are Glycemic control and Weight control.  Height 5' 9.5" (1.765 m), weight 282 lb 11.2 oz (128.232 kg). Body mass index is 41.15 kg/(m^2). Labs reviewed: LDL 91 at target, A1C 9.4% imporving, 2.4% above target Support systems: family- currently stressed with illness in family Patients belief/attitude about diabetes: some feeling of "no matter what I do I can't control my blood glucose levels", but today seems a bit more hopeful  Has only been taking 100 units of Lantus each day with Byetta twice a day before breakfast and dinner. Patient likes Byetta. Activity limited by right ankle and now left knee painful to patient Per meter download: 1 time/day-fasting range is 108 to 345, average x 30 days is 142.5 Breakfast: 198 today and 380 after meal (ate ~ 60 grams carb)  Per 24 hour recall: Has improved diet considerably, Does not eat out as often, bakes chicken and fish, eats more vegetables and fruit. Says he is learning a lot about how fried foods affect his blood sugars. Encouraged after meal blood sugar checks periodically.    Nutrtiion Diagnosis:  [place] 2.2 altered nutrition related lab value as related to inadequate blood sugar control due to mismatch of food intake, nutrient utilazation and insulin as evidenced by his elevated blood sugar post meal today and previously elevated a1C  Interventions: 1- Education about how to use meter download to identify trends and possible areas for improvement/lifestyle change. 2- Social support:encouraged patient to continue with healthy food choices and weight loss. 3- Coordination of care- discuss care with physician. Encouraged patient to schedule with PCP for knee pain.    Monitoring and Evaluation:   Patient to contact team via Phone if problems or  questions. Blood sugars, weight and food intake in 2-3 months. Future DSMT - 6 months Future MNT- 8 weeks   Carden Teel, Butch Penny

## 2011-08-09 NOTE — Patient Instructions (Signed)
Please inject 110 units lantus daily per your physician's instructions.   You are doing a great job changing your food choices and improving your blood pressure!  I encourage you to continue to try to decrease your weight and check an after meal blood sugar once in a while to see how the food choices you made worked.  Please follow up with [name] in 2-3 months: May- June 2013.

## 2011-08-11 ENCOUNTER — Other Ambulatory Visit: Payer: Self-pay | Admitting: Internal Medicine

## 2011-08-13 ENCOUNTER — Other Ambulatory Visit: Payer: Self-pay | Admitting: *Deleted

## 2011-08-13 MED ORDER — GLUCOSE BLOOD VI STRP: ORAL_STRIP | Status: DC

## 2011-10-11 DIAGNOSIS — IMO0002 Reserved for concepts with insufficient information to code with codable children: Secondary | ICD-10-CM | POA: Insufficient documentation

## 2011-10-22 ENCOUNTER — Ambulatory Visit (INDEPENDENT_AMBULATORY_CARE_PROVIDER_SITE_OTHER): Payer: 59 | Admitting: Internal Medicine

## 2011-10-22 ENCOUNTER — Encounter: Payer: Self-pay | Admitting: Internal Medicine

## 2011-10-22 VITALS — BP 129/73 | HR 76 | Temp 97.6°F | Ht 69.5 in | Wt 270.2 lb

## 2011-10-22 DIAGNOSIS — E669 Obesity, unspecified: Secondary | ICD-10-CM

## 2011-10-22 DIAGNOSIS — E119 Type 2 diabetes mellitus without complications: Secondary | ICD-10-CM

## 2011-10-22 DIAGNOSIS — I10 Essential (primary) hypertension: Secondary | ICD-10-CM

## 2011-10-22 DIAGNOSIS — N058 Unspecified nephritic syndrome with other morphologic changes: Secondary | ICD-10-CM

## 2011-10-22 DIAGNOSIS — Z79899 Other long term (current) drug therapy: Secondary | ICD-10-CM

## 2011-10-22 DIAGNOSIS — E1129 Type 2 diabetes mellitus with other diabetic kidney complication: Secondary | ICD-10-CM

## 2011-10-22 LAB — BASIC METABOLIC PANEL
BUN: 31 mg/dL — ABNORMAL HIGH (ref 6–23)
Chloride: 106 mEq/L (ref 96–112)
Potassium: 4.4 mEq/L (ref 3.5–5.3)

## 2011-10-22 LAB — POCT GLYCOSYLATED HEMOGLOBIN (HGB A1C): Hemoglobin A1C: 7.9

## 2011-10-22 NOTE — Progress Notes (Signed)
Subjective:   Patient ID: [name] male   DOB: Apr 04, 1956 56 y.o.   MRN: [id number]  HPI: [name] is a 56 y.o. male with past history significant as outlined below who presented to the clinic for regular office visit. Patient reports that he has been doing great. His knee pain has decreased significantly. He is trying to change his diet but has not been exercising. He feels overall good.    Past Medical History  Diagnosis Date  . Diabetes mellitus   . Hypertension   . GERD (gastroesophageal reflux disease)   . Hyperlipemia   . Paroxysmal atrial tachycardia   . Cardiomyopathy     Resolved with EF 55% 2011  . CARDIOMYOPATHY 04/22/2006  . DIABETIC  RETINOPATHY 02/06/2007  . NEPHROPATHY, DIABETIC 06/08/2006   Current Outpatient Prescriptions  Medication Sig Dispense Refill  . acetaminophen (TYLENOL) 500 MG tablet Take 500 mg by mouth every 6 (six) hours as needed.        Marland Kitchen amLODipine (NORVASC) 10 MG tablet Take 1 tablet (10 mg total) by mouth daily.  30 tablet  11  . aspirin 81 MG tablet Take 81 mg by mouth daily.        . Blood Glucose Monitoring Suppl (ONE TOUCH ULTRA MINI) W/DEVICE KIT Use to check blood sugars during the day at work before and after lunch       . carvedilol (COREG) 25 MG tablet TAKE ONE TABLET BY MOUTH TWICE DAILY  60 tablet  6  . exenatide (BYETTA 10 MCG PEN) 10 MCG/0.04ML SOLN Inject 0.04 mLs (10 mcg total) into the skin 2 (two) times daily with a meal.  1.2 mL  3  . furosemide (LASIX) 80 MG tablet TAKE ONE TABLET BY MOUTH EVERY DAY  30 tablet  2  . Glucosamine-Chondroitin (OSTEO BI-FLEX REGULAR STRENGTH) 250-200 MG TABS Take 4 caplets per day with food       . glucose blood (ONE TOUCH ULTRA TEST) test strip Check blood sugars before each meal  100 each  11  . Insulin Pen Needle 31G X 6 MM MISC Twice a day.  100 each  3  . Insulin Syringe-Needle U-100 (INSULIN SYRINGE 1CC/31GX5/16") 31G X 5/16" 1 ML MISC by Does not apply route 1 day or 1 dose. Use to  inject lantus insulin.      . Insulin Syringe-Needle U-100 (SUNMARK INSULIN SYR .3CC/29G) 29G X 1/2" 0.3 ML MISC Use as directed.  200 each  3  . Lancets (ONETOUCH ULTRASOFT) lancets Use to test your blood sugar before each meal each day       . LANTUS 100 UNIT/ML injection INJECT 110 UNITS ONCE DAILY IN THE MORNING. YOU CAN SPLIT 55 UNITS AND 55 UNITS AND INJECT AT TWO LOCATIONS  50 mL  3  . lisinopril (PRINIVIL,ZESTRIL) 30 MG tablet TAKE ONE TABLET BY MOUTH EVERY DAY  30 tablet  2  . metFORMIN (GLUCOPHAGE) 1000 MG tablet TAKE ONE TABLET BY MOUTH TWICE DAILY  60 tablet  3  . Omega-3 Fatty Acids (FISH OIL) 1200 MG CAPS Take 2 capsules by mouth daily.        . pravastatin (PRAVACHOL) 80 MG tablet Take 80 mg by mouth every evening.        Marland Kitchen RELION INSULIN SYRINGE 29G X 1/2" 1 ML MISC USE AS DIRECTED  100 each  2  . RELION PEN NEEDLE 31G/8MM 31G X 8 MM MISC USE AS DIRECTED TWICE DAILY  100 each  3  . tadalafil (CIALIS) 10 MG tablet Take 10 mg by mouth daily as needed. For sexual activity       . traMADol (ULTRAM) 50 MG tablet Take 1 tablet (50 mg total) by mouth 3 (three) times daily as needed. For pain  30 tablet  3   Family History  Problem Relation Age of Onset  . Diabetes Mother   . Diabetes Brother    History   Social History  . Marital Status: Single    Spouse Name: N/A    Number of Children: N/A  . Years of Education: N/A   Social History Main Topics  . Smoking status: Never Smoker   . Smokeless tobacco: None  . Alcohol Use: No  . Drug Use: No  . Sexually Active: None   Other Topics Concern  . None   Social History Narrative  . None   Review of Systems: Constitutional: Denies fever, chills, diaphoresis, appetite change and fatigue.  Respiratory: Denies SOB, DOE, cough, chest tightness,  and wheezing.   Cardiovascular: Denies chest pain, palpitations and leg swelling.  Gastrointestinal: Denies nausea, vomiting, abdominal pain, diarrhea, constipation, blood in stool and  abdominal distention.  Genitourinary: Denies dysuria, urgency, frequency, hematuria, flank pain and difficulty urinating.   Skin: Denies pallor, rash and wound.  Neurological: Denies dizziness,  weakness, light-headedness, numbness and headaches.  Psychiatric/Behavioral: Denies mood changes  Objective:  Physical Exam: Filed Vitals:   10/22/11 1548  BP: 129/73  Pulse: 76  Temp: 97.6 F (36.4 C)  TempSrc: Oral  Height: 5' 9.5" (1.765 m)  Weight: 270 lb 3.2 oz (122.562 kg)   Constitutional: Vital signs reviewed.  Patient is a well-developed and well-nourished man in no acute distress and cooperative with exam. Alert and oriented x3.  Mouth: no erythema or exudates, MMM Neck: Supple, Cardiovascular: RRR, S1 normal, S2 normal, no MRG, pulses symmetric and intact bilaterally Pulmonary/Chest: CTAB, no wheezes, rales, or rhonchi Abdominal: Soft. Non-tender, non-distended, bowel sounds are normal,  Neurological: A&O x3, no focal motor deficit, sensory intact to light touch bilaterally.  Skin: Warm, dry and intact. No rash, cyanosis, or clubbing.  Psychiatric: Normal mood and affect.

## 2011-10-22 NOTE — Assessment & Plan Note (Signed)
Patient has lost 12 pounds. I encouraged to continue his diet and start to exercise.

## 2011-10-22 NOTE — Assessment & Plan Note (Signed)
Blood pressure well controlled on current regimen. BP Readings from Last 3 Encounters:  10/22/11 129/73  07/09/11 132/72  06/13/11 146/78

## 2011-10-22 NOTE — Assessment & Plan Note (Signed)
We will obtain basic metabolic panel today.

## 2011-10-22 NOTE — Assessment & Plan Note (Addendum)
Patient's hemoglobin A1c has been significantly improved compared to January from 9.4 to 7.9. Patient has no lows reviewing patient CBGs. Furthermore patient has lost 12 pounds since her last office visit. I will at this point continue current regimen since patient did have significant improvement. Currently patient is taking Byetta 10 MCG and Lantus 110 units. I encouraged patient to continue his diet changes and start to exercise. Lab Results  Component Value Date   HGBA1C 7.9 10/22/2011

## 2011-11-02 ENCOUNTER — Other Ambulatory Visit: Payer: Self-pay | Admitting: Internal Medicine

## 2011-11-20 ENCOUNTER — Other Ambulatory Visit: Payer: Self-pay | Admitting: *Deleted

## 2011-11-20 DIAGNOSIS — I10 Essential (primary) hypertension: Secondary | ICD-10-CM

## 2011-11-20 MED ORDER — LISINOPRIL 30 MG PO TABS: 30.0000 mg | ORAL_TABLET | Freq: Every day | ORAL | Status: DC

## 2011-12-04 ENCOUNTER — Other Ambulatory Visit: Payer: Self-pay | Admitting: *Deleted

## 2011-12-04 DIAGNOSIS — E119 Type 2 diabetes mellitus without complications: Secondary | ICD-10-CM

## 2011-12-05 MED ORDER — EXENATIDE 10 MCG/0.04ML ~~LOC~~ SOPN: 10.0000 ug | PEN_INJECTOR | Freq: Two times a day (BID) | SUBCUTANEOUS | Status: DC

## 2011-12-28 ENCOUNTER — Other Ambulatory Visit: Payer: Self-pay | Admitting: *Deleted

## 2011-12-28 DIAGNOSIS — E119 Type 2 diabetes mellitus without complications: Secondary | ICD-10-CM

## 2011-12-28 MED ORDER — METFORMIN HCL 1000 MG PO TABS: 1000.0000 mg | ORAL_TABLET | Freq: Two times a day (BID) | ORAL | Status: DC

## 2012-01-28 ENCOUNTER — Ambulatory Visit (INDEPENDENT_AMBULATORY_CARE_PROVIDER_SITE_OTHER): Payer: 59 | Admitting: Internal Medicine

## 2012-01-28 ENCOUNTER — Encounter: Payer: Self-pay | Admitting: Internal Medicine

## 2012-01-28 VITALS — BP 126/70 | HR 80 | Temp 98.9°F | Ht 71.0 in | Wt 273.5 lb

## 2012-01-28 DIAGNOSIS — E119 Type 2 diabetes mellitus without complications: Secondary | ICD-10-CM

## 2012-01-28 DIAGNOSIS — I428 Other cardiomyopathies: Secondary | ICD-10-CM

## 2012-01-28 DIAGNOSIS — D649 Anemia, unspecified: Secondary | ICD-10-CM

## 2012-01-28 DIAGNOSIS — N183 Chronic kidney disease, stage 3 unspecified: Secondary | ICD-10-CM

## 2012-01-28 DIAGNOSIS — I10 Essential (primary) hypertension: Secondary | ICD-10-CM

## 2012-01-28 DIAGNOSIS — M7989 Other specified soft tissue disorders: Secondary | ICD-10-CM

## 2012-01-28 DIAGNOSIS — E669 Obesity, unspecified: Secondary | ICD-10-CM

## 2012-01-28 DIAGNOSIS — I129 Hypertensive chronic kidney disease with stage 1 through stage 4 chronic kidney disease, or unspecified chronic kidney disease: Secondary | ICD-10-CM

## 2012-01-28 DIAGNOSIS — E785 Hyperlipidemia, unspecified: Secondary | ICD-10-CM

## 2012-01-28 DIAGNOSIS — Z79899 Other long term (current) drug therapy: Secondary | ICD-10-CM

## 2012-01-28 LAB — CBC
HCT: 35.2 % — ABNORMAL LOW (ref 39.0–52.0)
Hemoglobin: 12 g/dL — ABNORMAL LOW (ref 13.0–17.0)
MCHC: 34.1 g/dL (ref 30.0–36.0)
RBC: 4.18 MIL/uL — ABNORMAL LOW (ref 4.22–5.81)

## 2012-01-28 LAB — GLUCOSE, CAPILLARY: Glucose-Capillary: 205 mg/dL — ABNORMAL HIGH (ref 70–99)

## 2012-01-28 NOTE — Patient Instructions (Signed)
No changes in medication. Exercise at least 2 times a week for 10-30 min . Continue to good work with your diet.

## 2012-01-28 NOTE — Progress Notes (Signed)
Subjective:   Patient ID: [name] male   DOB: [birth year] 56 y.o.   MRN: [id number]  HPI: [name] is a 56 y.o.  Male with PMH significant as outlined below who presented to the clinic for a regular follow up. Patient report that he has been doing fine. He has no complains today.  Diabetes:  CBG reading ( 7/28-8/26/13): Highest : 262 Lowest 125 Median 145   Diet:   Breakfast: cereal, boiled egg, tee   Snack: granola bar, apple,banana  Lunch: whole grain sandwich with low fat mayonesse/ peanut butter/ tuna Dinner: veggies, soup, fish , rice  Exercise: walking at work    Past Medical History  Diagnosis Date  . Diabetes mellitus   . Hypertension   . GERD (gastroesophageal reflux disease)   . Hyperlipemia   . Paroxysmal atrial tachycardia   . Cardiomyopathy     2 D echo 2011 EF 55% with some hypokinesis at the base of the inferior wall. Mild LVH  . CARDIOMYOPATHY 04/22/2006  . DIABETIC  RETINOPATHY 02/06/2007  . NEPHROPATHY, DIABETIC 06/08/2006   Current Outpatient Prescriptions  Medication Sig Dispense Refill  . acetaminophen (TYLENOL) 500 MG tablet Take 500 mg by mouth every 6 (six) hours as needed.        Marland Kitchen amLODipine (NORVASC) 10 MG tablet Take 1 tablet (10 mg total) by mouth daily.  30 tablet  11  . aspirin 81 MG tablet Take 81 mg by mouth daily.        . Blood Glucose Monitoring Suppl (ONE TOUCH ULTRA MINI) W/DEVICE KIT Use to check blood sugars during the day at work before and after lunch       . carvedilol (COREG) 25 MG tablet TAKE ONE TABLET BY MOUTH TWICE DAILY  60 tablet  6  . exenatide (BYETTA 10 MCG PEN) 10 MCG/0.04ML SOLN Inject 0.04 mLs (10 mcg total) into the skin 2 (two) times daily with a meal.  2.4 mL  3  . furosemide (LASIX) 80 MG tablet TAKE ONE TABLET BY MOUTH EVERY DAY  30 tablet  6  . Glucosamine-Chondroitin (OSTEO BI-FLEX REGULAR STRENGTH) 250-200 MG TABS Take 4 caplets per day with food       . glucose blood (ONE TOUCH ULTRA TEST) test  strip Check blood sugars before each meal  100 each  11  . Insulin Pen Needle 31G X 6 MM MISC Twice a day.  100 each  3  . Insulin Syringe-Needle U-100 (INSULIN SYRINGE 1CC/31GX5/16") 31G X 5/16" 1 ML MISC by Does not apply route 1 day or 1 dose. Use to inject lantus insulin.      . Insulin Syringe-Needle U-100 (SUNMARK INSULIN SYR .3CC/29G) 29G X 1/2" 0.3 ML MISC Use as directed.  200 each  3  . Lancets (ONETOUCH ULTRASOFT) lancets Use to test your blood sugar before each meal each day       . LANTUS 100 UNIT/ML injection INJECT 110 UNITS ONCE DAILY IN THE MORNING. YOU CAN SPLIT 55 UNITS AND 55 UNITS AND INJECT AT TWO LOCATIONS  50 mL  3  . lisinopril (PRINIVIL,ZESTRIL) 30 MG tablet Take 1 tablet (30 mg total) by mouth daily.  30 tablet  2  . metFORMIN (GLUCOPHAGE) 1000 MG tablet Take 1 tablet (1,000 mg total) by mouth 2 (two) times daily.  60 tablet  2  . Omega-3 Fatty Acids (FISH OIL) 1200 MG CAPS Take 2 capsules by mouth daily.        Marland Kitchen  pravastatin (PRAVACHOL) 80 MG tablet Take 80 mg by mouth every evening.        Marland Kitchen RELION INSULIN SYRINGE 29G X 1/2" 1 ML MISC USE AS DIRECTED  100 each  2  . RELION PEN NEEDLE 31G/8MM 31G X 8 MM MISC USE AS DIRECTED TWICE DAILY  100 each  3  . tadalafil (CIALIS) 10 MG tablet Take 10 mg by mouth daily as needed. For sexual activity       . traMADol (ULTRAM) 50 MG tablet Take 1 tablet (50 mg total) by mouth 3 (three) times daily as needed. For pain  30 tablet  3   Family History  Problem Relation Age of Onset  . Diabetes Mother   . Diabetes Brother    History   Social History  . Marital Status: Single    Spouse Name: N/A    Number of Children: N/A  . Years of Education: N/A   Social History Main Topics  . Smoking status: Never Smoker   . Smokeless tobacco: None  . Alcohol Use: No  . Drug Use: No  . Sexually Active: None   Other Topics Concern  . None   Social History Narrative  . None   Review of Systems: Constitutional: Denies fever,  chills, diaphoresis, appetite change and fatigue.    Respiratory: Denies SOB, DOE, cough, chest tightness,  and wheezing.   Cardiovascular: Denies chest pain, palpitations and leg swelling.  Gastrointestinal: Denies nausea, vomiting, abdominal pain, diarrhea, constipation, blood in stool and abdominal distention.  Genitourinary: Denies dysuria, urgency, frequency, hematuria, flank pain and difficulty urinating.  Musculoskeletal: Denies myalgias, back pain, joint swelling, arthralgias and gait problem.  Neurological: Denies dizziness, weakness, light-headedness,  and headaches.    Objective:  Physical Exam: Filed Vitals:   01/28/12 1554  BP: 126/70  Pulse: 80  Temp: 98.9 F (37.2 C)  TempSrc: Oral  Height: 5\' 11"  (1.803 m)  Weight: 273 lb 8 oz (124.059 kg)   Constitutional: Vital signs reviewed.  Patient is a well-developed and well-nourished  in no acute distress and cooperative with exam. Alert and oriented x3.  Mouth: no erythema or exudates, MMM Neck: Supple,  Cardiovascular: RRR, S1 normal, S2 normal, no MRG, pulses symmetric and intact bilaterally,  2-3 + pitting edema in lower extremities  Pulmonary/Chest: CTAB, no wheezes, rales, or rhonchi Abdominal: Soft. Non-tender, obese, bowel sounds are normal,  Musculoskeletal: No joint deformities, erythema, or stiffness, ROM full and no nontender Neurological: A&O x3, no focal motor deficit, Skin: Warm, dry and intact.

## 2012-01-29 LAB — COMPLETE METABOLIC PANEL WITH GFR
AST: 10 U/L (ref 0–37)
Albumin: 3.7 g/dL (ref 3.5–5.2)
BUN: 29 mg/dL — ABNORMAL HIGH (ref 6–23)
Calcium: 8.9 mg/dL (ref 8.4–10.5)
Chloride: 106 mEq/L (ref 96–112)
Glucose, Bld: 198 mg/dL — ABNORMAL HIGH (ref 70–99)
Potassium: 4.2 mEq/L (ref 3.5–5.3)
Total Protein: 6.3 g/dL (ref 6.0–8.3)

## 2012-01-30 DIAGNOSIS — N183 Chronic kidney disease, stage 3 unspecified: Secondary | ICD-10-CM | POA: Insufficient documentation

## 2012-01-30 DIAGNOSIS — M7989 Other specified soft tissue disorders: Secondary | ICD-10-CM | POA: Insufficient documentation

## 2012-01-30 DIAGNOSIS — N185 Chronic kidney disease, stage 5: Secondary | ICD-10-CM | POA: Insufficient documentation

## 2012-01-30 NOTE — Assessment & Plan Note (Signed)
Discussed about the importance of diet and exercise control. Patient had lost 9 pounds in the last 5 month.

## 2012-01-30 NOTE — Assessment & Plan Note (Signed)
I will obtain CBC to monitor Hgb.

## 2012-01-30 NOTE — Assessment & Plan Note (Signed)
Control of his diabetes has been a long struggle with Hgb A1c of 14 in 2007 to now 8.1 today. This is higher then Hgb A1c from 3 month ago when it was 7.9. Patient is currently on Metformin 1000 mg bid, Byetta 10 mcg bid  and Lantus 110 units daily.  Patient diet seems to be appropriate and he noted that he controls his carb intake. At this point I will continue current regimen and will give another 3 month trial. We may have to again change management if no significant improvement is notable. I encouraged to control diet and emphasized the importance of exercise.  Patient was evaluated by Ophthalmology this month. I have not yet received the note.

## 2012-01-30 NOTE — Assessment & Plan Note (Signed)
Blood pressure well controlled. Patient medication regimen include Iisinopril 30 mg , lasix 80, Norvasc 10 and Coreg 25 bid .Will continue current regimen BP Readings from Last 3 Encounters:  01/28/12 126/70  10/22/11 129/73  07/09/11 132/72

## 2012-01-30 NOTE — Assessment & Plan Note (Signed)
Patient is having progressive worsening renal function. Today patient was noted to have increased lower extremity edema. Patient medication regimen include Iisinopril 30 mg , lasix 80, Norvasc 10 and Coreg 25 bid with good blood pressure control. I have tried to increase Lisinopril to max dose but this caused worsening renal function.  Control of his diabetes has been a long struggle with Hgb A1c of 14 in 2007 to now 8.1 today. Patient is currently on Metformin 1000 mg bid, Byetta 10 mcg bid  and Lantus 110 units daily.  Considering the progression of renal disease I will refer patient to Nephrology.

## 2012-01-30 NOTE — Assessment & Plan Note (Signed)
Patient was noted to have significant worsening lower extremity edema. Unclear if this is related to worsening renal function. Patient also had history of cardiomyopathy with EF 55 % per Echo in 2011 ( which is improved from 2007 when EF was 35 %) with some hypokinesis at the base of the inferior wall. Mild LVH . No note about diastolic dysfunction. I will obtain a 2 D echo and will refer patient to Nephrology.

## 2012-01-31 ENCOUNTER — Ambulatory Visit (HOSPITAL_COMMUNITY): Payer: 59

## 2012-02-05 ENCOUNTER — Ambulatory Visit (HOSPITAL_COMMUNITY)
Admission: RE | Admit: 2012-02-05 | Discharge: 2012-02-05 | Disposition: A | Discharge status: Home / Self Care | Payer: 59 | Source: Ambulatory Visit | Attending: Internal Medicine | Admitting: Internal Medicine

## 2012-02-05 DIAGNOSIS — I10 Essential (primary) hypertension: Secondary | ICD-10-CM | POA: Insufficient documentation

## 2012-02-05 DIAGNOSIS — I428 Other cardiomyopathies: Secondary | ICD-10-CM

## 2012-02-05 DIAGNOSIS — I369 Nonrheumatic tricuspid valve disorder, unspecified: Secondary | ICD-10-CM | POA: Insufficient documentation

## 2012-02-05 DIAGNOSIS — R609 Edema, unspecified: Secondary | ICD-10-CM | POA: Insufficient documentation

## 2012-02-05 DIAGNOSIS — I059 Rheumatic mitral valve disease, unspecified: Secondary | ICD-10-CM | POA: Insufficient documentation

## 2012-02-05 DIAGNOSIS — E119 Type 2 diabetes mellitus without complications: Secondary | ICD-10-CM | POA: Insufficient documentation

## 2012-02-05 NOTE — Progress Notes (Signed)
  Echocardiogram 2D Echocardiogram has been performed.  [name] 02/05/2012, 12:54 PM

## 2012-04-04 ENCOUNTER — Other Ambulatory Visit: Payer: Self-pay | Admitting: Internal Medicine

## 2012-05-09 ENCOUNTER — Other Ambulatory Visit: Payer: Self-pay | Admitting: Internal Medicine

## 2012-05-09 DIAGNOSIS — I10 Essential (primary) hypertension: Secondary | ICD-10-CM

## 2012-05-09 DIAGNOSIS — E785 Hyperlipidemia, unspecified: Secondary | ICD-10-CM

## 2012-06-18 ENCOUNTER — Encounter: Payer: Self-pay | Admitting: Internal Medicine

## 2012-07-19 ENCOUNTER — Other Ambulatory Visit: Payer: Self-pay | Admitting: Internal Medicine

## 2012-07-21 ENCOUNTER — Ambulatory Visit (INDEPENDENT_AMBULATORY_CARE_PROVIDER_SITE_OTHER): Payer: 59 | Admitting: Internal Medicine

## 2012-07-21 ENCOUNTER — Encounter: Payer: Self-pay | Admitting: Internal Medicine

## 2012-07-21 VITALS — BP 170/84 | HR 70 | Temp 96.4°F | Ht 71.0 in | Wt 267.3 lb

## 2012-07-21 DIAGNOSIS — N183 Chronic kidney disease, stage 3 unspecified: Secondary | ICD-10-CM

## 2012-07-21 DIAGNOSIS — E1139 Type 2 diabetes mellitus with other diabetic ophthalmic complication: Secondary | ICD-10-CM

## 2012-07-21 DIAGNOSIS — I129 Hypertensive chronic kidney disease with stage 1 through stage 4 chronic kidney disease, or unspecified chronic kidney disease: Secondary | ICD-10-CM

## 2012-07-21 DIAGNOSIS — M7989 Other specified soft tissue disorders: Secondary | ICD-10-CM

## 2012-07-21 DIAGNOSIS — E11319 Type 2 diabetes mellitus with unspecified diabetic retinopathy without macular edema: Secondary | ICD-10-CM

## 2012-07-21 DIAGNOSIS — R37 Sexual dysfunction, unspecified: Secondary | ICD-10-CM

## 2012-07-21 DIAGNOSIS — E119 Type 2 diabetes mellitus without complications: Secondary | ICD-10-CM

## 2012-07-21 DIAGNOSIS — I10 Essential (primary) hypertension: Secondary | ICD-10-CM

## 2012-07-21 DIAGNOSIS — E785 Hyperlipidemia, unspecified: Secondary | ICD-10-CM

## 2012-07-21 DIAGNOSIS — E669 Obesity, unspecified: Secondary | ICD-10-CM

## 2012-07-21 DIAGNOSIS — Z79899 Other long term (current) drug therapy: Secondary | ICD-10-CM

## 2012-07-21 LAB — LIPID PANEL
Cholesterol: 243 mg/dL — ABNORMAL HIGH (ref 0–200)
HDL: 48 mg/dL (ref >39)
LDL Cholesterol: 163 mg/dL — ABNORMAL HIGH (ref 0–99)
Total CHOL/HDL Ratio: 5.1 Ratio
Triglycerides: 162 mg/dL — ABNORMAL HIGH (ref <150)
VLDL: 32 mg/dL (ref 0–40)

## 2012-07-21 LAB — PHOSPHORUS: Phosphorus: 3.3 mg/dL (ref 2.3–4.6)

## 2012-07-21 MED ORDER — CARVEDILOL 25 MG PO TABS: 25.0000 mg | ORAL_TABLET | Freq: Two times a day (BID) | ORAL | Status: DC

## 2012-07-21 MED ORDER — AMLODIPINE BESYLATE 10 MG PO TABS
10.0000 mg | ORAL_TABLET | Freq: Every day | ORAL | Status: DC
Start: 2012-07-21 — End: 2012-10-20

## 2012-07-21 MED ORDER — LISINOPRIL 20 MG PO TABS: 30.0000 mg | ORAL_TABLET | Freq: Every day | ORAL | Status: DC

## 2012-07-21 MED ORDER — VARDENAFIL HCL 10 MG PO TABS: 10.0000 mg | ORAL_TABLET | Freq: Every day | ORAL | Status: DC | PRN

## 2012-07-21 MED ORDER — FUROSEMIDE 80 MG PO TABS: 80.0000 mg | ORAL_TABLET | Freq: Every day | ORAL | Status: DC

## 2012-07-21 NOTE — Assessment & Plan Note (Signed)
Patient has an appointment with ophthalmology next month.

## 2012-07-21 NOTE — Assessment & Plan Note (Signed)
Patient had lost 6 pounds since the last office visit in August. Patient reports he has been doing some exercise and changed his diet. I encouraged continued these.

## 2012-07-21 NOTE — Assessment & Plan Note (Signed)
Blood pressures uncontrolled during this office visit. Patient has not been taking his medication since 1 week. I advised him to take medication on a daily basis. I will reevaluate patient in 2 weeks for possible changes in management

## 2012-07-21 NOTE — Assessment & Plan Note (Signed)
Hemoglobin A1c today 7.8 which has improved from 8.1 6 month ago. Patient informed about the importance to followup on a regular basis. I will continue current regimen with metformin thousand milligrams twice a day, Lantus 55 units twice a day and byetta . I encouraged to continue diet control and exercise as much as possible. Continue future I would like to discontinue Byetta if his hemoglobin A1c is improving

## 2012-07-21 NOTE — Progress Notes (Signed)
Subjective:   Patient ID: REGORY CLUNEY male   DOB: 08-11-1955 57 y.o.   MRN: [id number]  HPI: Mr.Cuong B Flaugher is a 57 y.o. male with past medical history significant as outlined below who presented to the clinic for regular office visit. Patient reports he has been doing great. Has no complaints today  DM: Average 155 , Hypoglycemia ) Changed diet , tries his best to do some exercise   Hypertension; patient not had money to buy his blood pressure medication since 1 week but he noted that he will do it today.   Past Medical History  Diagnosis Date  . Diabetes mellitus   . Hypertension   . GERD (gastroesophageal reflux disease)   . Hyperlipemia   . Paroxysmal atrial tachycardia   . Cardiomyopathy     Resolved with EF 55% 2011  . CARDIOMYOPATHY 04/22/2006  . DIABETIC  RETINOPATHY 02/06/2007  . NEPHROPATHY, DIABETIC 06/08/2006   Current Outpatient Prescriptions  Medication Sig Dispense Refill  . acetaminophen (TYLENOL) 500 MG tablet Take 500 mg by mouth every 6 (six) hours as needed.        Marland Kitchen amLODipine (NORVASC) 10 MG tablet Take 1 tablet (10 mg total) by mouth daily.  30 tablet  11  . aspirin 81 MG tablet Take 81 mg by mouth daily.        . Blood Glucose Monitoring Suppl (ONE TOUCH ULTRA MINI) W/DEVICE KIT Use to check blood sugars during the day at work before and after lunch       . carvedilol (COREG) 25 MG tablet TAKE ONE TABLET BY MOUTH TWICE DAILY  60 tablet  6  . exenatide (BYETTA 10 MCG PEN) 10 MCG/0.04ML SOLN Inject 0.04 mLs (10 mcg total) into the skin 2 (two) times daily with a meal.  2.4 mL  3  . furosemide (LASIX) 80 MG tablet TAKE ONE TABLET BY MOUTH EVERY DAY  30 tablet  6  . Glucosamine-Chondroitin (OSTEO BI-FLEX REGULAR STRENGTH) 250-200 MG TABS Take 4 caplets per day with food       . glucose blood (ONE TOUCH ULTRA TEST) test strip Check blood sugars before each meal  100 each  11  . Insulin Pen Needle 31G X 6 MM MISC Twice a day.  100 each  3  . Insulin  Syringe-Needle U-100 (INSULIN SYRINGE 1CC/31GX5/16") 31G X 5/16" 1 ML MISC by Does not apply route 1 day or 1 dose. Use to inject lantus insulin.      . Insulin Syringe-Needle U-100 (SUNMARK INSULIN SYR .3CC/29G) 29G X 1/2" 0.3 ML MISC Use as directed.  200 each  3  . Lancets (ONETOUCH ULTRASOFT) lancets Use to test your blood sugar before each meal each day       . LANTUS 100 UNIT/ML injection INJECT 110 UNITS ONCE DAILY IN THE MORNING. YOU CAN SPLIT 55 UNITS AND 55 UNITS AND INJECT AT TWO LOCATIONS  50 mL  3  . lisinopril (PRINIVIL,ZESTRIL) 20 MG tablet TAKE ONE & ONE-HALF TABLETS BY MOUTH EVERY DAY  45 tablet  1  . metFORMIN (GLUCOPHAGE) 1000 MG tablet Take 1 tablet (1,000 mg total) by mouth 2 (two) times daily.  60 tablet  2  . Omega-3 Fatty Acids (FISH OIL) 1200 MG CAPS Take 2 capsules by mouth daily.        . pravastatin (PRAVACHOL) 40 MG tablet TAKE TWO TABLETS BY MOUTH IN THE EVENING  60 tablet  2  . pravastatin (PRAVACHOL) 80 MG tablet Take  80 mg by mouth every evening.        Marland Kitchen RELION INSULIN SYRINGE 29G X 1/2" 1 ML MISC USE AS DIRECTED  100 each  2  . RELION PEN NEEDLE 31G/8MM 31G X 8 MM MISC USE AS DIRECTED TWICE DAILY  100 each  3  . tadalafil (CIALIS) 10 MG tablet Take 10 mg by mouth daily as needed. For sexual activity       . traMADol (ULTRAM) 50 MG tablet TAKE ONE TABLET BY MOUTH THREE TIMES DAILY AS NEEDED FOR PAIN  30 tablet  2   No current facility-administered medications for this visit.   Family History  Problem Relation Age of Onset  . Diabetes Mother   . Diabetes Brother    History   Social History  . Marital Status: Single    Spouse Name: N/A    Number of Children: N/A  . Years of Education: N/A   Social History Main Topics  . Smoking status: Never Smoker   . Smokeless tobacco: Not on file  . Alcohol Use: No  . Drug Use: No  . Sexually Active: Not on file   Other Topics Concern  . Not on file   Social History Narrative  . No narrative on file    Review of Systems: Constitutional: Denies fever, chills, diaphoresis, appetite change and fatigue.    Respiratory: Denies SOB, DOE, cough, chest tightness,  and wheezing.   Cardiovascular: Denies chest pain, palpitations and leg swelling.  Gastrointestinal: Denies nausea, vomiting, abdominal pain, diarrhea, constipation, blood in stool and abdominal distention.  Genitourinary: Denies dysuria, urgency, frequency, hematuria, flank pain and difficulty urinating.   Skin: Denies pallor, rash and wound.  Neurological: Denies dizziness, weakness, light-headedness, numbness and headaches.    Objective:  Physical Exam: Filed Vitals:   07/21/12 1338  BP: 170/84  Pulse: 70  Temp: 96.4 F (35.8 C)  TempSrc: Oral  Height: 5\' 11"  (1.803 m)  Weight: 267 lb 4.8 oz (121.246 kg)  SpO2: 99%   Constitutional: Vital signs reviewed.  Patient is a well-developed and well-nourished male  in no acute distress and cooperative with exam. Alert and oriented x3.  Neck: Supple,   Cardiovascular: RRR, S1 normal, S2 normal, no MRG, pulses 1 + in right foot and 2 + in left foot Pulmonary/Chest: CTAB, no wheezes, rales, or rhonchi Abdominal: Soft. Non-tender, non-distended, bowel sounds are normal,  Hematology: no cervical adenopathy.  Neurological: A&O x3, Strength is normal and symmetric bilaterally,no focal motor deficit, sensory intact to light touch bilaterally.  Skin: Warm, dry and intact. No rash, cyanosis, or clubbing.

## 2012-07-21 NOTE — Assessment & Plan Note (Signed)
Patient was referred to nephrology in the past it he was not able to keep his appointment. I will refer patient today in again. I will obtain basic metabolic panel to

## 2012-07-21 NOTE — Assessment & Plan Note (Signed)
I will obtain lipid panel today for possible changes in management.

## 2012-07-21 NOTE — Patient Instructions (Signed)
Stop taking Cialis  if you are taking the new medication :

## 2012-07-21 NOTE — Assessment & Plan Note (Signed)
Patient has chronic leg swelling. Mostly worse in the evening. But recently it has been increased in his right leg. On physical exam pulse in right foot was decreased. I will obtain an ABI for further evaluation and management.

## 2012-07-22 ENCOUNTER — Encounter (HOSPITAL_COMMUNITY): Payer: 59

## 2012-07-22 LAB — BASIC METABOLIC PANEL WITH GFR
BUN: 35 mg/dL — ABNORMAL HIGH (ref 6–23)
Calcium: 9.1 mg/dL (ref 8.4–10.5)
Creat: 1.52 mg/dL — ABNORMAL HIGH (ref 0.50–1.35)
GFR, Est African American: 57 mL/min — ABNORMAL LOW
GFR, Est Non African American: 49 mL/min — ABNORMAL LOW
Potassium: 4.7 mEq/L (ref 3.5–5.3)

## 2012-07-23 ENCOUNTER — Ambulatory Visit (HOSPITAL_COMMUNITY)
Admission: RE | Admit: 2012-07-23 | Discharge: 2012-07-23 | Disposition: A | Discharge status: Home / Self Care | Payer: 59 | Source: Ambulatory Visit | Attending: Internal Medicine | Admitting: Internal Medicine

## 2012-07-23 DIAGNOSIS — M7989 Other specified soft tissue disorders: Secondary | ICD-10-CM

## 2012-07-23 NOTE — Progress Notes (Signed)
VASCULAR LAB PRELIMINARY  ARTERIAL  ABI completed:    RIGHT    LEFT    PRESSURE WAVEFORM  PRESSURE WAVEFORM  BRACHIAL 135 Triphasic BRACHIAL 133 Triphasic  DP 136 Triphasic DP 145 Triphasic  AT   AT    PT 125 Triphasic PT 133 Triphasic  PER   PER    GREAT TOE  NA GREAT TOE  NA    RIGHT LEFT  ABI 1.01 1.07    07/23/2012 3:20 PM Maudry Mayhew, RDMS, RDCS

## 2012-07-25 ENCOUNTER — Encounter: Payer: Self-pay | Admitting: Internal Medicine

## 2012-07-29 ENCOUNTER — Telehealth: Payer: Self-pay | Admitting: *Deleted

## 2012-07-29 ENCOUNTER — Encounter: Payer: Self-pay | Admitting: Internal Medicine

## 2012-07-29 NOTE — Telephone Encounter (Signed)
Pt calls and states he made his appt for 3/11 at 1530 with kidney md

## 2012-08-04 ENCOUNTER — Encounter: Payer: 59 | Admitting: Internal Medicine

## 2012-08-07 ENCOUNTER — Other Ambulatory Visit: Payer: Self-pay | Admitting: Internal Medicine

## 2012-08-07 DIAGNOSIS — E1165 Type 2 diabetes mellitus with hyperglycemia: Secondary | ICD-10-CM

## 2012-08-20 ENCOUNTER — Other Ambulatory Visit: Payer: Self-pay | Admitting: Nephrology

## 2012-08-20 DIAGNOSIS — N183 Chronic kidney disease, stage 3 unspecified: Secondary | ICD-10-CM

## 2012-08-21 ENCOUNTER — Ambulatory Visit
Admission: RE | Admit: 2012-08-21 | Discharge: 2012-08-21 | Disposition: A | Discharge status: Home / Self Care | Payer: 59 | Source: Ambulatory Visit | Attending: Nephrology | Admitting: Nephrology

## 2012-08-21 DIAGNOSIS — N183 Chronic kidney disease, stage 3 unspecified: Secondary | ICD-10-CM

## 2012-09-04 ENCOUNTER — Other Ambulatory Visit: Payer: Self-pay | Admitting: Internal Medicine

## 2012-09-12 ENCOUNTER — Other Ambulatory Visit: Payer: Self-pay | Admitting: Internal Medicine

## 2012-09-12 DIAGNOSIS — E1159 Type 2 diabetes mellitus with other circulatory complications: Secondary | ICD-10-CM

## 2012-10-06 ENCOUNTER — Encounter: Payer: Self-pay | Admitting: Internal Medicine

## 2012-10-20 ENCOUNTER — Ambulatory Visit (INDEPENDENT_AMBULATORY_CARE_PROVIDER_SITE_OTHER): Payer: 59 | Admitting: Internal Medicine

## 2012-10-20 ENCOUNTER — Encounter: Payer: Self-pay | Admitting: Internal Medicine

## 2012-10-20 VITALS — BP 129/73 | HR 63 | Temp 98.1°F | Ht 71.0 in | Wt 260.0 lb

## 2012-10-20 DIAGNOSIS — E1159 Type 2 diabetes mellitus with other circulatory complications: Secondary | ICD-10-CM

## 2012-10-20 DIAGNOSIS — I10 Essential (primary) hypertension: Secondary | ICD-10-CM

## 2012-10-20 DIAGNOSIS — E785 Hyperlipidemia, unspecified: Secondary | ICD-10-CM

## 2012-10-20 DIAGNOSIS — N183 Chronic kidney disease, stage 3 unspecified: Secondary | ICD-10-CM

## 2012-10-20 DIAGNOSIS — I428 Other cardiomyopathies: Secondary | ICD-10-CM

## 2012-10-20 DIAGNOSIS — E669 Obesity, unspecified: Secondary | ICD-10-CM

## 2012-10-20 DIAGNOSIS — I129 Hypertensive chronic kidney disease with stage 1 through stage 4 chronic kidney disease, or unspecified chronic kidney disease: Secondary | ICD-10-CM

## 2012-10-20 LAB — POCT GLYCOSYLATED HEMOGLOBIN (HGB A1C): Hemoglobin A1C: 8.8

## 2012-10-20 LAB — GLUCOSE, CAPILLARY: Glucose-Capillary: 177 mg/dL — ABNORMAL HIGH (ref 70–99)

## 2012-10-20 MED ORDER — CARVEDILOL 25 MG PO TABS: 25.0000 mg | ORAL_TABLET | Freq: Two times a day (BID) | ORAL | Status: DC

## 2012-10-20 MED ORDER — LISINOPRIL 20 MG PO TABS: 30.0000 mg | ORAL_TABLET | Freq: Every day | ORAL | Status: DC

## 2012-10-20 MED ORDER — PRAVASTATIN SODIUM 40 MG PO TABS: ORAL_TABLET | ORAL | Status: DC

## 2012-10-20 MED ORDER — METFORMIN HCL 1000 MG PO TABS: ORAL_TABLET | ORAL | Status: DC

## 2012-10-20 MED ORDER — AMLODIPINE BESYLATE 10 MG PO TABS: 10.0000 mg | ORAL_TABLET | Freq: Every day | ORAL | Status: DC

## 2012-10-20 MED ORDER — TRAMADOL HCL 50 MG PO TABS: ORAL_TABLET | ORAL | Status: DC

## 2012-10-20 NOTE — Progress Notes (Signed)
Subjective:   Patient ID: [name] male   DOB: Aug 22, 1955 57 y.o.   MRN: [id number]  HPI: [name] is a 57 y.o. male with past history significant as outlined below who presented to the clinic for regular followup. Patient noted that he has been doing fine and has no complaints today except for erectile dysfunction.     Past Medical History  Diagnosis Date  . Diabetes mellitus   . Hypertension   . GERD (gastroesophageal reflux disease)   . Hyperlipemia   . Paroxysmal atrial tachycardia   . Cardiomyopathy     Resolved with EF 55% 2011  . CARDIOMYOPATHY 04/22/2006  . DIABETIC  RETINOPATHY 02/06/2007  . NEPHROPATHY, DIABETIC 06/08/2006   Current Outpatient Prescriptions  Medication Sig Dispense Refill  . acetaminophen (TYLENOL) 500 MG tablet Take 500 mg by mouth every 6 (six) hours as needed.        Marland Kitchen amLODipine (NORVASC) 10 MG tablet Take 1 tablet (10 mg total) by mouth daily.  30 tablet  2  . aspirin 81 MG tablet Take 81 mg by mouth daily.        . Blood Glucose Monitoring Suppl (ONE TOUCH ULTRA MINI) W/DEVICE KIT Use to check blood sugars during the day at work before and after lunch       . carvedilol (COREG) 25 MG tablet Take 1 tablet (25 mg total) by mouth 2 (two) times daily with a meal.  60 tablet  3  . exenatide (BYETTA 10 MCG PEN) 10 MCG/0.04ML SOLN Inject 0.04 mLs (10 mcg total) into the skin 2 (two) times daily with a meal.  2.4 mL  3  . furosemide (LASIX) 80 MG tablet Take 1 tablet (80 mg total) by mouth daily.  30 tablet  3  . Glucosamine-Chondroitin (OSTEO BI-FLEX REGULAR STRENGTH) 250-200 MG TABS Take 4 caplets per day with food       . Insulin Pen Needle 31G X 6 MM MISC Twice a day.  100 each  3  . Insulin Syringe-Needle U-100 (INSULIN SYRINGE 1CC/31GX5/16") 31G X 5/16" 1 ML MISC by Does not apply route 1 day or 1 dose. Use to inject lantus insulin.      . Insulin Syringe-Needle U-100 (SUNMARK INSULIN SYR .3CC/29G) 29G X 1/2" 0.3 ML MISC Use as directed.   200 each  3  . Lancets (ONETOUCH ULTRASOFT) lancets Use to test your blood sugar before each meal each day       . LANTUS 100 UNIT/ML injection INJECT 110 UNITS SUBCUTANEOUSLY ONCE DAILY IN THE MORNING. YOU CAN SPLIT 55 UNITS AND 55 UNITS AND INJECT AT TWO LOCATIONS  2 vial  3  . lisinopril (PRINIVIL,ZESTRIL) 20 MG tablet Take 1.5 tablets (30 mg total) by mouth daily.  45 tablet  2  . metFORMIN (GLUCOPHAGE) 1000 MG tablet TAKE ONE TABLET BY MOUTH TWICE DAILY  60 tablet  2  . Omega-3 Fatty Acids (FISH OIL) 1200 MG CAPS Take 2 capsules by mouth daily.        . ONE TOUCH ULTRA TEST test strip USE TO CHECK BLOOD SUGAR BEFORE EACH MEAL  100 each  1  . pravastatin (PRAVACHOL) 40 MG tablet TAKE TWO TABLETS BY MOUTH IN THE EVENING  60 tablet  2  . pravastatin (PRAVACHOL) 80 MG tablet Take 80 mg by mouth every evening.        Marland Kitchen RELION INSULIN SYRINGE 29G X 1/2" 1 ML MISC USE AS DIRECTED  100 each  2  . RELION PEN NEEDLE 31G/8MM 31G X 8 MM MISC USE AS DIRECTED TWICE DAILY  100 each  3  . traMADol (ULTRAM) 50 MG tablet TAKE ONE TABLET BY MOUTH THREE TIMES DAILY AS NEEDED FOR PAIN  30 tablet  2  . vardenafil (LEVITRA) 10 MG tablet Take 1 tablet (10 mg total) by mouth daily as needed for erectile dysfunction.  10 tablet  0   No current facility-administered medications for this visit.   Family History  Problem Relation Age of Onset  . Diabetes Mother   . Diabetes Brother    History   Social History  . Marital Status: Single    Spouse Name: N/A    Number of Children: N/A  . Years of Education: N/A   Social History Main Topics  . Smoking status: Never Smoker   . Smokeless tobacco: None  . Alcohol Use: No  . Drug Use: No  . Sexually Active: None   Other Topics Concern  . None   Social History Narrative  . None   Review of Systems: Constitutional: Denies fever, chills, diaphoresis, appetite change and fatigue.  Respiratory: Denies SOB, DOE, cough, chest tightness,  and wheezing.    Cardiovascular: Denies chest pain, palpitations and leg swelling.  Gastrointestinal: Denies nausea, vomiting, abdominal pain, diarrhea, constipation, blood in stool and abdominal distention.  Genitourinary: Denies dysuria, urgency, frequency, hematuria, flank pain and difficulty urinating. .  Skin: Denies pallor, rash and wound.  Neurological: Denies dizziness   Objective:  Physical Exam: Filed Vitals:   10/20/12 1623  BP: 129/73  Pulse: 63  Temp: 98.1 F (36.7 C)  TempSrc: Oral  Height: 5\' 11"  (1.803 m)  Weight: 260 lb (117.935 kg)  SpO2: 98%   Constitutional: Vital signs reviewed.  Patient is a well-developed and well-nourished male in no acute distress and cooperative with exam. Alert and oriented x3.  Eyes: PERRL, EOMI, conjunctivae normal, No scleral icterus.  Neck: Supple,  Cardiovascular: RRR, S1 normal, S2 normal, no MRG, pulses symmetric and intact bilaterally Pulmonary/Chest: CTAB, no wheezes, rales, or rhonchi Abdominal: Soft. Non-tender, non-distended, bowel sounds are normal,  Hematology: no cervical adenopathy.  Neurological: A&O x3, S  Skin: Warm, dry and intact. No rash, cyanosis, or clubbing.

## 2012-10-21 ENCOUNTER — Encounter: Payer: Self-pay | Admitting: Internal Medicine

## 2012-10-21 NOTE — Assessment & Plan Note (Signed)
Patient continues to have uncontrolled diabetes with hemoglobin A1c of 8.8 which is a decline from 3 months ago from a hemoglobin A1c was 7.8. Patient reports he's complained with his medication. Patient currently on byetta 10 MCG 2 times daily, Lantus 10 units daily and metformin thousand milligram twice a day. Patient noted he has improved his diet and reviewing the chart patient has lost since December 2012 38 pounds. Considering patient's diabetic retinopathy and diabetic nephropathy patient needs well controlled diabetes. It seems with current medication regimen with me once he is not improving. I'll refer patient to endocrinology for further evaluation and management.

## 2012-10-21 NOTE — Assessment & Plan Note (Signed)
Blood pressure is at goal. Will continue lisinopril during 30 mg daily, carbidopa 25 mg daily Lasix 80 mg daily and amlodipine 10 mg daily. BP Readings from Last 3 Encounters:  10/20/12 129/73  07/21/12 170/84  01/28/12 126/70

## 2012-11-03 NOTE — Progress Notes (Signed)
INTERNAL MEDICINE TEACHING ATTENDING ADDENDUM: I discussed this case with Dr. Newt Lukes soon after the patient visit. I have read the documentation and I agree with the plan of care. Please see the resident note for details of management.

## 2013-03-04 ENCOUNTER — Encounter: Payer: Self-pay | Admitting: Gastroenterology

## 2013-04-13 ENCOUNTER — Other Ambulatory Visit: Payer: Self-pay | Admitting: *Deleted

## 2013-04-13 DIAGNOSIS — I10 Essential (primary) hypertension: Secondary | ICD-10-CM

## 2013-04-14 MED ORDER — TRAMADOL HCL 50 MG PO TABS: ORAL_TABLET | ORAL | Status: DC

## 2013-04-15 NOTE — Telephone Encounter (Signed)
Rx called into pharmacy.Despina Hidden Cassady11/12/20143:08 PM

## 2013-04-21 ENCOUNTER — Other Ambulatory Visit: Payer: Self-pay | Admitting: *Deleted

## 2013-04-21 DIAGNOSIS — E1159 Type 2 diabetes mellitus with other circulatory complications: Secondary | ICD-10-CM

## 2013-04-22 MED ORDER — GLUCOSE BLOOD VI STRP: 1.0000 | ORAL_STRIP | Status: DC | PRN

## 2013-05-06 ENCOUNTER — Other Ambulatory Visit: Payer: Self-pay | Admitting: *Deleted

## 2013-05-06 DIAGNOSIS — I10 Essential (primary) hypertension: Secondary | ICD-10-CM

## 2013-05-08 MED ORDER — AMLODIPINE BESYLATE 10 MG PO TABS: 10.0000 mg | ORAL_TABLET | Freq: Every day | ORAL | Status: DC

## 2013-06-18 ENCOUNTER — Other Ambulatory Visit: Payer: Self-pay | Admitting: *Deleted

## 2013-06-18 DIAGNOSIS — E785 Hyperlipidemia, unspecified: Secondary | ICD-10-CM

## 2013-06-18 MED ORDER — PRAVASTATIN SODIUM 40 MG PO TABS: ORAL_TABLET | ORAL | Status: DC

## 2013-08-05 ENCOUNTER — Other Ambulatory Visit: Payer: Self-pay | Admitting: *Deleted

## 2013-08-05 DIAGNOSIS — I10 Essential (primary) hypertension: Secondary | ICD-10-CM

## 2013-08-05 MED ORDER — FUROSEMIDE 80 MG PO TABS: 80.0000 mg | ORAL_TABLET | Freq: Every day | ORAL | Status: DC

## 2013-08-05 MED ORDER — CARVEDILOL 25 MG PO TABS: 25.0000 mg | ORAL_TABLET | Freq: Two times a day (BID) | ORAL | Status: DC

## 2013-08-05 MED ORDER — LISINOPRIL 20 MG PO TABS
30.0000 mg | ORAL_TABLET | Freq: Every day | ORAL | Status: DC
Start: 2013-08-05 — End: 2014-02-06

## 2013-08-25 ENCOUNTER — Encounter: Payer: Self-pay | Admitting: Gastroenterology

## 2014-01-23 ENCOUNTER — Other Ambulatory Visit: Payer: Self-pay | Admitting: Internal Medicine

## 2014-02-06 ENCOUNTER — Other Ambulatory Visit: Payer: Self-pay | Admitting: Internal Medicine

## 2014-03-01 ENCOUNTER — Other Ambulatory Visit: Payer: Self-pay | Admitting: Internal Medicine

## 2014-04-17 ENCOUNTER — Other Ambulatory Visit: Payer: Self-pay | Admitting: Internal Medicine

## 2014-05-12 ENCOUNTER — Encounter: Payer: Self-pay | Admitting: Internal Medicine

## 2014-05-24 ENCOUNTER — Ambulatory Visit (INDEPENDENT_AMBULATORY_CARE_PROVIDER_SITE_OTHER): Payer: 59 | Admitting: Internal Medicine

## 2014-05-24 ENCOUNTER — Encounter: Payer: Self-pay | Admitting: Internal Medicine

## 2014-05-24 ENCOUNTER — Ambulatory Visit (HOSPITAL_COMMUNITY)
Admission: RE | Admit: 2014-05-24 | Discharge: 2014-05-24 | Disposition: A | Discharge status: Home / Self Care | Payer: 59 | Source: Ambulatory Visit | Attending: Internal Medicine | Admitting: Internal Medicine

## 2014-05-24 VITALS — BP 180/81 | HR 78 | Temp 97.9°F | Ht 71.0 in | Wt 287.3 lb

## 2014-05-24 DIAGNOSIS — M25421 Effusion, right elbow: Secondary | ICD-10-CM | POA: Diagnosis not present

## 2014-05-24 DIAGNOSIS — I10 Essential (primary) hypertension: Secondary | ICD-10-CM

## 2014-05-24 DIAGNOSIS — M7989 Other specified soft tissue disorders: Secondary | ICD-10-CM

## 2014-05-24 DIAGNOSIS — M25521 Pain in right elbow: Secondary | ICD-10-CM | POA: Insufficient documentation

## 2014-05-24 DIAGNOSIS — E1122 Type 2 diabetes mellitus with diabetic chronic kidney disease: Secondary | ICD-10-CM

## 2014-05-24 DIAGNOSIS — D631 Anemia in chronic kidney disease: Secondary | ICD-10-CM

## 2014-05-24 DIAGNOSIS — N183 Chronic kidney disease, stage 3 unspecified: Secondary | ICD-10-CM

## 2014-05-24 DIAGNOSIS — E118 Type 2 diabetes mellitus with unspecified complications: Secondary | ICD-10-CM

## 2014-05-24 DIAGNOSIS — N189 Chronic kidney disease, unspecified: Secondary | ICD-10-CM

## 2014-05-24 DIAGNOSIS — I129 Hypertensive chronic kidney disease with stage 1 through stage 4 chronic kidney disease, or unspecified chronic kidney disease: Secondary | ICD-10-CM

## 2014-05-24 DIAGNOSIS — E1165 Type 2 diabetes mellitus with hyperglycemia: Secondary | ICD-10-CM

## 2014-05-24 DIAGNOSIS — IMO0002 Reserved for concepts with insufficient information to code with codable children: Secondary | ICD-10-CM

## 2014-05-24 DIAGNOSIS — Z794 Long term (current) use of insulin: Secondary | ICD-10-CM

## 2014-05-24 LAB — CBC WITH DIFFERENTIAL/PLATELET
Basophils Absolute: 0.1 10*3/uL (ref 0.0–0.1)
Basophils Relative: 1 % (ref 0–1)
Eosinophils Absolute: 0.2 10*3/uL (ref 0.0–0.7)
Eosinophils Relative: 2 % (ref 0–5)
HCT: 35 % — ABNORMAL LOW (ref 39.0–52.0)
Hemoglobin: 12 g/dL — ABNORMAL LOW (ref 13.0–17.0)
LYMPHS PCT: 29 % (ref 12–46)
Lymphs Abs: 3 10*3/uL (ref 0.7–4.0)
MCH: 29.6 pg (ref 26.0–34.0)
MCHC: 34.3 g/dL (ref 30.0–36.0)
MCV: 86.2 fL (ref 78.0–100.0)
MPV: 10.6 fL (ref 9.4–12.4)
Monocytes Absolute: 1 10*3/uL (ref 0.1–1.0)
Monocytes Relative: 10 % (ref 3–12)
NEUTROS ABS: 5.9 10*3/uL (ref 1.7–7.7)
Neutrophils Relative %: 58 % (ref 43–77)
PLATELETS: 250 10*3/uL (ref 150–400)
RBC: 4.06 MIL/uL — AB (ref 4.22–5.81)
RDW: 13.4 % (ref 11.5–15.5)
WBC: 10.2 10*3/uL (ref 4.0–10.5)

## 2014-05-24 LAB — BASIC METABOLIC PANEL WITH GFR
BUN: 35 mg/dL — ABNORMAL HIGH (ref 6–23)
CHLORIDE: 107 meq/L (ref 96–112)
CO2: 23 mEq/L (ref 19–32)
CREATININE: 1.75 mg/dL — AB (ref 0.50–1.35)
Calcium: 9.1 mg/dL (ref 8.4–10.5)
GFR, EST NON AFRICAN AMERICAN: 41 mL/min — AB
GFR, Est African American: 48 mL/min — ABNORMAL LOW
Glucose, Bld: 202 mg/dL — ABNORMAL HIGH (ref 70–99)
POTASSIUM: 4.7 meq/L (ref 3.5–5.3)
Sodium: 140 mEq/L (ref 135–145)

## 2014-05-24 LAB — GLUCOSE, CAPILLARY: Glucose-Capillary: 183 mg/dL — ABNORMAL HIGH (ref 70–99)

## 2014-05-24 LAB — POCT GLYCOSYLATED HEMOGLOBIN (HGB A1C): Hemoglobin A1C: 8.3

## 2014-05-24 MED ORDER — LISINOPRIL 20 MG PO TABS: 30.0000 mg | ORAL_TABLET | Freq: Every day | ORAL | Status: DC

## 2014-05-24 MED ORDER — CEPHALEXIN 500 MG PO CAPS: 500.0000 mg | ORAL_CAPSULE | Freq: Three times a day (TID) | ORAL | Status: AC

## 2014-05-24 MED ORDER — TRAMADOL HCL 50 MG PO TABS: ORAL_TABLET | ORAL | Status: DC

## 2014-05-24 NOTE — Assessment & Plan Note (Signed)
H/o anemia of chronic disease, also likely related to CKD.  -Repeat CBC today.

## 2014-05-24 NOTE — Assessment & Plan Note (Signed)
Lab Results  Component Value Date   HGBA1C 8.3 05/24/2014   HGBA1C 8.8 10/20/2012   HGBA1C 7.8 07/21/2012     Assessment: Diabetes control: fair control Progress toward A1C goal:  improved Comments: Has not been seen in the clinic since 10/2013. Has been following w/ Dr. Chalmers Cater who has made extreme changes to his medications. He is no longer taking Metformin, Lantus, or Byetta as he was last time he was seen in the [facility] clinic. He did not bring his medications today, but seems to be taking Novolog bid; says he takes 60 units in the AM, 50 units in the PM ([facility] note from 01/2014 says 70 + 50). Will need to verify this. HbA1c is somewhat improved since his last clinic visit however. He has gained 27 lbs since 10/2012, according to the weights recorded. He does endorse recent weight gain. May have to do w/ fluid as well.   Plan: Medications:  continue current medications; will obtain records from Dr. Chalmers Cater; have instructed patient to bring ALL of his medications to his next clinic visit.  Home glucose monitoring: Frequency: once a day Timing: before breakfast Instruction/counseling given: discussed the need for weight loss Educational resources provided: brochure (has information) Self management tools provided: copy of home glucose meter download, home glucose logbook Other plans: BMP today.  -RTC in 2-4 weeks for further management of chronic issues.  -Confirm medications with Dr. Chalmers Cater; obtain notes.

## 2014-05-24 NOTE — Patient Instructions (Signed)
General Instructions:  1. Please schedule a follow up appointment for Wednesday.  2. Please take all medications as prescribed.   Start taking Keflex 500 mg every 8 hours (three times daily) for right hand infection.   Please obtain X-Ray of right hand and elbow.   3. If you have worsening of your symptoms or new symptoms arise, please call the clinic [phone] or go to the ER immediately if symptoms are severe.  You have done a great job in taking all your medications. I appreciate it very much. Please continue doing that.   Please bring your medicines with you each time you come to clinic.  Medicines may include prescription medications, over-the-counter medications, herbal remedies, eye drops, vitamins, or other pills.   Self Care Goals & Plans:  Self Care Goal 05/24/2014  Manage my medications take my medicines as prescribed; bring my medications to every visit; refill my medications on time  Monitor my health keep track of my blood glucose; bring my glucose meter and log to each visit  Eat healthy foods drink diet soda or water instead of juice or soda; eat more vegetables; eat foods that are low in salt; eat baked foods instead of fried foods; eat fruit for snacks and desserts    No flowsheet data found.   Care Management & Community Referrals:  No flowsheet data found.

## 2014-05-24 NOTE — Assessment & Plan Note (Signed)
Significant pain and swelling in the right hand extending into the wrist. Also has involvement of the right elbow, spares the forearm. No obvious streaking, drainage, or induration. Quite warm and tender on exam with skin color change over the dorsum of the hand. Most likely cellulitis or erysipeloid type infection given the appearance. Non purulent in nature. Somewhat curious about the elbow involvement, however, may very likely be lymphadenopathy from distal infection. Do not suspect DVT given sparing of the forearm. The patient has NO systemic symptoms; no fever, chills, nausea, vomiting. No significant rash. Pulses are present distally, hand very warm to the touch. May also be Gout, however, has never had in the past and suprisingly less tender than one would expect with a gout flare. If no improvement with antibiotics (as listed below), can consider further management for gout. May also be septic joint, however, extent of hand is involved and seems to be more of a soft tissue infection than joint infection.  -XR right hand and elbow -CBC pending -Start Keflex 500 mg tid (verified renal dosage adjustment w/ pharmacy).  -Patient will follow up in 2 days (wednesday) -Instructed him to call the clinic or come to the ED or urgent care if pain worsens, he has fever, chills, nausea, or paresthesias in the hand.  -Also given tramadol 50 mg tid prn #30 for pain (has been taking this for some time).

## 2014-05-24 NOTE — Progress Notes (Signed)
Subjective:   Patient ID: [name] male   DOB: 1956-05-07 58 y.o.   MRN: [id number]  HPI: [name] is a 58 y.o. male w/ PMHx of HTN, DM type II, HLD, GERD, and CKD stage 3, presents to the clinic today for a follow-up visit. [name] has not been seen in the clinic since 10/2012 for management of his chronic illnesses. The patient is also followed by Dr. Chalmers Cater for his DM and Dr. Lorrene Reid for his CKD. He was last seen at [place] in 01/2014 and was found to have an increased Cr to 2.18 (from 1.7).  Today, the patient presents with acute onset right hand pain and swelling. He says the pain started about 4 days ago involving the full extent of his hand resulting in significant swelling and pain within the MCP joints. He has never had this before. He also noted some involvement of his right elbow, mostly in the radial groove with associated swelling as well. No significant pain or swelling in the forearm. He denies any systemic symptoms. No fever, chills, nausea, vomiting, diaphoresis, malaise, lightheadedness, or palpitations. He says his hand (unsure which one) and elbow swelled like this long ago when he had an acute fracture, however, this time he denies any acute event or trauma leading up to the pain and swelling.   Past Medical History  Diagnosis Date  . Diabetes mellitus   . Hypertension   . GERD (gastroesophageal reflux disease)   . Hyperlipemia   . Paroxysmal atrial tachycardia   . Cardiomyopathy     Resolved with EF 55% 2011  . CARDIOMYOPATHY 04/22/2006  . DIABETIC  RETINOPATHY 02/06/2007  . NEPHROPATHY, DIABETIC 06/08/2006   Current Outpatient Prescriptions  Medication Sig Dispense Refill  . acetaminophen (TYLENOL) 500 MG tablet Take 500 mg by mouth every 6 (six) hours as needed.      Marland Kitchen amLODipine (NORVASC) 10 MG tablet Take 1 tablet (10 mg total) by mouth daily. 90 tablet 4  . aspirin 81 MG tablet Take 81 mg by mouth daily.      . Blood Glucose Monitoring Suppl  (ONE TOUCH ULTRA MINI) W/DEVICE KIT Use to check blood sugars during the day at work before and after lunch     . carvedilol (COREG) 25 MG tablet TAKE ONE TABLET BY MOUTH TWICE DAILY WITH MEALS 60 tablet 5  . exenatide (BYETTA 10 MCG PEN) 10 MCG/0.04ML SOLN Inject 0.04 mLs (10 mcg total) into the skin 2 (two) times daily with a meal. 2.4 mL 3  . furosemide (LASIX) 80 MG tablet TAKE ONE TABLET BY MOUTH ONCE DAILY 30 tablet 5  . Glucosamine-Chondroitin (OSTEO BI-FLEX REGULAR STRENGTH) 250-200 MG TABS Take 4 caplets per day with food     . glucose blood (ONE TOUCH ULTRA TEST) test strip 1 each by Other route as needed for other. Use 3 to 4 times daily to check blood sugar. diag code 250.72. Insulin dependent 100 each 5  . Insulin Pen Needle 31G X 6 MM MISC Twice a day. 100 each 3  . Insulin Syringe-Needle U-100 (INSULIN SYRINGE 1CC/31GX5/16") 31G X 5/16" 1 ML MISC by Does not apply route 1 day or 1 dose. Use to inject lantus insulin.    . Insulin Syringe-Needle U-100 (SUNMARK INSULIN SYR .3CC/29G) 29G X 1/2" 0.3 ML MISC Use as directed. 200 each 3  . Lancets (ONETOUCH ULTRASOFT) lancets Use to test your blood sugar before each meal each day     .  LANTUS 100 UNIT/ML injection INJECT 110 UNITS SUBCUTANEOUSLY ONCE DAILY IN THE MORNING. YOU CAN SPLIT 55 UNITS AND 55 UNITS AND INJECT AT TWO LOCATIONS 2 vial 3  . lisinopril (PRINIVIL,ZESTRIL) 20 MG tablet TAKE ONE AND ONE-HALF TABLETS BY MOUTH ONCE DAILY 45 tablet 5  . metFORMIN (GLUCOPHAGE) 1000 MG tablet TAKE ONE TABLET BY MOUTH TWICE DAILY 60 tablet 2  . Omega-3 Fatty Acids (FISH OIL) 1200 MG CAPS Take 2 capsules by mouth daily.      . pravastatin (PRAVACHOL) 40 MG tablet TAKE TWO TABLETS BY MOUTH IN THE EVENING 60 tablet 5  . RELION INSULIN SYRINGE 29G X 1/2" 1 ML MISC USE AS DIRECTED 100 each 2  . RELION PEN NEEDLE 31G/8MM 31G X 8 MM MISC USE AS DIRECTED TWICE DAILY 100 each 3  . traMADol (ULTRAM) 50 MG tablet TAKE ONE TABLET BY MOUTH THREE TIMES DAILY  AS NEEDED FOR PAIN 30 tablet 2  . Vitamin D, Ergocalciferol, (DRISDOL) 50000 UNITS CAPS      No current facility-administered medications for this visit.    Review of Systems: General: Denies fever, chills, diaphoresis, appetite change and fatigue.  Respiratory: Denies SOB, DOE, cough, and wheezing.   Cardiovascular: Positive for LE edema. Denies chest pain and palpitations.  Gastrointestinal: Denies nausea, vomiting, abdominal pain, and diarrhea.  Genitourinary: Denies dysuria, increased frequency, and flank pain. Endocrine: Denies hot or cold intolerance, polyuria, and polydipsia. Musculoskeletal: Positive for right hand/elbow pain and swelling. Denies myalgias, back pain, arthralgias and gait problem.  Skin: Denies pallor, rash and wounds.  Neurological: Denies dizziness, seizures, syncope, weakness, lightheadedness, numbness and headaches.  Psychiatric/Behavioral: Denies mood changes, and sleep disturbances.  Objective:   Physical Exam: Filed Vitals:   05/24/14 1602  BP: 180/81  Pulse: 78  Temp: 97.9 F (36.6 C)  TempSrc: Oral  Height: 5' 11"  (1.803 m)  Weight: 287 lb 4.8 oz (130.318 kg)  SpO2: 99%    General: Obese AA male, Alert, cooperative, NAD. Wears glasses.  HEENT: PERRL, EOMI. Moist mucus membranes Neck: Full range of motion without pain, supple, no lymphadenopathy or carotid bruits Lungs: Clear to ascultation bilaterally, normal work of respiration, no wheezes, rales, rhonchi Heart: RRR, no murmurs, gallops, or rubs Abdomen: Soft, non-tender, non-distended, BS + Extremities: No cyanosis or clubbing. Right hand swelling (non-pitting edema) w/ overlying skin color change (darkened) extending just distal to the wrist. Tender to palpation and with extension of the fingers. Faint mottled appearance of the right palm. Warm to the touch. Right elbow w/ swelling over the olecranon process and tenderness within the radial groove. No obvious streaking, wounds, ulcers, or  discharge. LE's w/ +2 pitting edema, R>L (chronic). Distal pulses present.  Neurologic: Alert & oriented X3, cranial nerves II-XII intact, strength grossly intact, sensation intact to light touch   Assessment & Plan:   Please see problem based assessment and plan.

## 2014-05-24 NOTE — Assessment & Plan Note (Addendum)
Seen by Dr. Lorrene Reid as recently as 01/2014. Most recent records from [facility] obtained. Most recent Cr from 01/2014 2.18, increased from 1.7 previously.  -Continue Lisinopril 30 mg daily -Lasix 80 mg daily -Repeat BMP + CBC today

## 2014-05-24 NOTE — Assessment & Plan Note (Signed)
BP Readings from Last 3 Encounters:  05/24/14 180/81  10/20/12 129/73  07/21/12 170/84    Lab Results  Component Value Date   NA 142 07/21/2012   K 4.7 07/21/2012   CREATININE 1.52* 07/21/2012    Assessment: Blood pressure control: severely elevated Progress toward BP goal:  deteriorated Comments: Did not discuss this at length today given his acute issue involving a likely right hand cellulitis. However, does state he has been compliant with his medications, but did not bring any of them today. Has not been in clinic since 10/2012 but states he is taking Lisinopril 30 mg daily, Lasix 80 mg daily, Norvasc 10 mg daily, and Coreg 25 mg daily. May be elevated on account of acute infection w/ pain.   Plan: Medications:  continue current medications; If elevated at next visit, consider increasing Lisinopril if GFR is stable, otherwise consider adding/changing regimen.  Educational resources provided: brochure (has information on) Self management tools provided:   Other plans: BMP checked today. Patient will follow up in 2 days for assessment of cellulitis, but needs to be seen in 2-4 weeks for further management of chronic issues.

## 2014-05-25 ENCOUNTER — Telehealth: Payer: Self-pay | Admitting: Internal Medicine

## 2014-05-25 NOTE — Telephone Encounter (Signed)
Called Mr. Calderon today to check on his hand pain and swelling. Says he thinks it is quite a bit better than yesterday, but still swollen. He says his pain is controlled currently. Still without fever, chills, or nausea. Informed him of his XR findings and appointment on 05/26/14 @ 8:45 AM. Patient understands.    Luanne Bras, MD 05/25/2014 1:06 PM

## 2014-05-25 NOTE — Progress Notes (Signed)
Internal Medicine Clinic Attending  I saw and evaluated the patient.  I personally confirmed the key portions of the history and exam documented by Dr. Ronnald Ramp and I reviewed pertinent patient test results.  The assessment, diagnosis, and plan were formulated together and I agree with the documentation in the resident's note.  Agree with Dr. Ronnald Ramp assessment.  Clinical presentation is most consistent with infection and we will see him back in 2 days for further evaluation.

## 2014-05-26 ENCOUNTER — Encounter: Payer: Self-pay | Admitting: Internal Medicine

## 2014-05-26 ENCOUNTER — Ambulatory Visit (INDEPENDENT_AMBULATORY_CARE_PROVIDER_SITE_OTHER): Payer: 59 | Admitting: Internal Medicine

## 2014-05-26 VITALS — BP 120/58 | HR 58 | Temp 97.8°F | Ht 71.0 in | Wt 286.2 lb

## 2014-05-26 DIAGNOSIS — M7989 Other specified soft tissue disorders: Secondary | ICD-10-CM

## 2014-05-26 MED ORDER — TRAMADOL HCL 50 MG PO TABS: ORAL_TABLET | ORAL | Status: DC

## 2014-05-26 NOTE — Progress Notes (Signed)
   Subjective:    Patient ID: [name], male    DOB: 1956/03/03, 58 y.o.   MRN: [id number]  HPI Comments: 58 y.o presents for 2 day f/u for  1. Hand pain and swelling unclear etiology but given Keflex in case cellulitis and Tramadol for pain.  Right hand swelling and pain have improved and are getting better but still has pain with movement and limited ROM but improved ROM since seen 2 days ago.  Pain is 6/10.  Reviewed imaging which showed degenerative changes at the elbow and possibly olecranon bursitis and right hand with mild degenerative changes. Denies h/o gout, trauma, bug bite or scratch at site   2. H/o HTN BP is 120/58 today 3. DM 2 last HA1C 8.3 12/21-pt has h/o PDR and ?glaucoma and follows with Dr. Herbert Deaner and it is almost time to see again  HM-had flu shot at work     Review of Systems  Constitutional: Negative for fever and chills.       Objective:   Physical Exam  Constitutional: He is oriented to person, place, and time. Vital signs are normal. He appears well-developed and well-nourished. He is cooperative.  HENT:  Head: Normocephalic and atraumatic.  Mouth/Throat: No oropharyngeal exudate.  Eyes: Conjunctivae are normal. Right eye exhibits no discharge. Left eye exhibits no discharge. No scleral icterus.  Cardiovascular: Regular rhythm, S1 normal, S2 normal and normal heart sounds.  Bradycardia present.   No murmur heard. Pulmonary/Chest: Effort normal and breath sounds normal.  Musculoskeletal: He exhibits no edema.       Arms: 1-2 + pitting edema right hand though improved with hyperpigmented changes. Pain with bending 1st and middle fingers mild to mod pain at PIP jts of 2nd and 3rd fingers with palpation, decreased ROM though improved per pt in hand grip and bending fingers.  Neurological: He is alert and oriented to person, place, and time. Gait normal.  Skin: Skin is warm and dry.  Right hand skin intact   Psychiatric: He has a normal mood and affect.  His speech is normal and behavior is normal. Judgment and thought content normal. Cognition and memory are normal.  Nursing note and vitals reviewed.         Assessment & Plan:  F/u Jan 4, 16

## 2014-05-26 NOTE — Assessment & Plan Note (Signed)
Etiology unclear. Pt has never had gout flare.  Swelling and pain seem to be improving with Keflex and tramadol so may have been cellulitis.  Also degenerative changes and olecranon bursitis on imaging  Will have pt f/u 06/07/14 if not completely better  Continue elevation of arm If worsening come to ED or clinic prior to 06/07/14  Will Rx refill Tramadol 50 mg tid #15 no refills to continue to take.   If sx's not better esp elbow consider referral to Sports Medicine for aspiration/steroid injection

## 2014-05-26 NOTE — Patient Instructions (Signed)
General Instructions: Continue to elevate the hand Follow up in January 4th if needed make appt today  Take care Arthritis, Nonspecific Arthritis is pain, redness, warmth, or puffiness (inflammation) of a joint. The joint may be stiff or hurt when you move it. One or more joints may be affected. There are many types of arthritis. Your doctor may not know what type you have right away. The most common cause of arthritis is wear and tear on the joint (osteoarthritis). HOME CARE   Only take medicine as told by your doctor.  Rest the joint as much as possible.  Raise (elevate) your joint if it is puffy.  Use crutches if the painful joint is in your leg.  Drink enough fluids to keep your pee (urine) clear or pale yellow.  Follow your doctor's diet instructions.  Use cold packs for very bad joint pain for 10 to 15 minutes every hour. Ask your doctor if it is okay for you to use hot packs.  Exercise as told by your doctor.  Take a warm shower if you have stiffness in the morning.  Move your sore joints throughout the day. GET HELP RIGHT AWAY IF:   You have a fever.  You have very bad joint pain, puffiness, or redness.  You have many joints that are painful and puffy.  You are not getting better with treatment.  You have very bad back pain or leg weakness.  You cannot control when you poop (bowel movement) or pee (urinate).  You do not feel better in 24 hours or are getting worse.  You are having side effects from your medicine. MAKE SURE YOU:   Understand these instructions.  Will watch your condition.  Will get help right away if you are not doing well or get worse. Document Released: 08/15/2009 Document Revised: 11/20/2011 Document Reviewed: 08/15/2009 [facility] Patient Information 2015 [place], [place]. This information is not intended to replace advice given to you by your health care provider. Make sure you discuss any questions you have with your health care  provider.    Treatment Goals:  Goals (1 Years of Data) as of 05/26/14          As of Today 05/24/14 10/20/12 07/21/12     Blood Pressure   . Blood Pressure < 130/80  120/58 180/81 129/73      Result Component   . HEMOGLOBIN A1C < 7.0   8.3 8.8    . LDL CALC < 100     163      Progress Toward Treatment Goals:  Treatment Goal 05/26/2014  Hemoglobin A1C unchanged  Blood pressure at goal    Self Care Goals & Plans:  Self Care Goal 05/26/2014  Manage my medications take my medicines as prescribed; bring my medications to every visit; refill my medications on time; follow the sick day instructions if I am sick  Monitor my health keep track of my blood glucose; keep track of my blood pressure  Eat healthy foods drink diet soda or water instead of juice or soda; eat more vegetables; eat foods that are low in salt; eat baked foods instead of fried foods; eat fruit for snacks and desserts; eat smaller portions  Be physically active find an activity I enjoy  Meeting treatment goals maintain the current self-care plan    Home Blood Glucose Monitoring 05/24/2014  Check my blood sugar once a day  When to check my blood sugar before breakfast     Care Management &  Community Referrals:  Referral 05/26/2014  Referrals made for care management support none needed  Referrals made to community resources none

## 2014-05-26 NOTE — Progress Notes (Signed)
I saw and evaluated the patient. I personally confirmed the key portions of Dr. Claris Gladden history and exam and reviewed pertinent patient test results. The assessment, diagnosis, and plan were formulated together and I agree with the documentation in the resident's note.  Examination failed to reveal swelling of the olecranon bursa or intra-articular swelling of the right elbow.  I believe all of the swelling is soft tissue.  Therefore, there is nothing to drain at this point, and a steroid injection would be inappropriate for gout as this is currently low in the differential (although still on the list).

## 2014-05-27 ENCOUNTER — Other Ambulatory Visit: Payer: Self-pay | Admitting: Internal Medicine

## 2014-05-31 ENCOUNTER — Encounter: Payer: Self-pay | Admitting: Internal Medicine

## 2014-05-31 ENCOUNTER — Ambulatory Visit (INDEPENDENT_AMBULATORY_CARE_PROVIDER_SITE_OTHER): Payer: 59 | Admitting: Internal Medicine

## 2014-05-31 VITALS — BP 181/95 | HR 73 | Temp 98.2°F | Wt 282.5 lb

## 2014-05-31 DIAGNOSIS — I10 Essential (primary) hypertension: Secondary | ICD-10-CM

## 2014-05-31 DIAGNOSIS — M7989 Other specified soft tissue disorders: Secondary | ICD-10-CM

## 2014-05-31 DIAGNOSIS — Z0000 Encounter for general adult medical examination without abnormal findings: Secondary | ICD-10-CM

## 2014-05-31 NOTE — Progress Notes (Signed)
   Subjective:    Patient ID: [name], male    DOB: April 07, 1956, 59 y.o.   MRN: [id number]  HPI [name] is a 57 yr old man with PMH of CKD, DM2, HTN, who presents for follow up for swelling of right hand and right elbow pain thought to be 2/2 to cellulitis that have improved with antibiotic treatment--he will finish his Abx on 12/31.  He also needs work excuse for 12/22-31 as he has been unable to work due to his right hand pain/swelling.  He has been out of his BP medicines for 3 days now--he requests refills over the holiday break.   Review of Systems  Constitutional: Negative for fever, chills, diaphoresis, activity change, appetite change and fatigue.  HENT: Negative for congestion.   Eyes: Negative for visual disturbance.  Respiratory: Negative for cough and shortness of breath.   Cardiovascular: Negative for chest pain, palpitations and leg swelling.  Gastrointestinal: Negative for abdominal pain and diarrhea.  Genitourinary: Negative for dysuria.  Musculoskeletal: Positive for arthralgias.       Right elbow and right hand pain  Skin: Negative for rash.  Neurological: Negative for headaches.  Psychiatric/Behavioral: Negative for agitation.       Objective:   Physical Exam  Constitutional: He is oriented to person, place, and time. He appears well-developed. No distress.  Morbidly obese   Cardiovascular: Normal rate and regular rhythm.   Pulmonary/Chest: Effort normal and breath sounds normal. No respiratory distress. He has no wheezes. He has no rales.  Abdominal: Soft.  Musculoskeletal: He exhibits tenderness.  LE with trace pitting edema bilaterally up to his knees.  Right hand with mild sweeling and warmth compared to the left with decreased ROM but intact pulse and sensation.  Right elbow with mild swelling and increased warmth compared to the left with decreased ROM due to pain, no erythema, no palpable mass/fluctuance.   Neurological: He is alert and oriented to  person, place, and time.  Skin: Skin is warm and dry. He is not diaphoretic.  Psychiatric: He has a normal mood and affect. His behavior is normal.  Nursing note and vitals reviewed.         Assessment & Plan:

## 2014-05-31 NOTE — Assessment & Plan Note (Signed)
Per his report, the swelling and pain of the elbow and right hand are improving.  He will continue taking Keflex TID until 12/31, has follow up with the [facility] on 06/08/14.  -He still has Tramadol PRN for pain--reports that the pain is getting better -Provided a work excuse note from 12/21 to 06/08/14--he works in Production assistant, radio and it is painful for him to work with his swollen hand

## 2014-05-31 NOTE — Patient Instructions (Signed)
General Instructions: -Continue taking your antibiotic until you finish it completely.  -Please take your blood pressure medications and call one week before you run out of them.  -If you have fever/ chills, nausea, vomiting, or if the swelling of your arm and hand is worse go to the Emergency Department.  -Follow up with [place] on January 6th.    Please bring your medicines with you each time you come to clinic.  Medicines may include prescription medications, over-the-counter medications, herbal remedies, eye drops, vitamins, or other pills.   Progress Toward Treatment Goals:  Treatment Goal 05/26/2014  Hemoglobin A1C unchanged  Blood pressure at goal    Self Care Goals & Plans:  Self Care Goal 05/31/2014  Manage my medications take my medicines as prescribed; bring my medications to every visit; refill my medications on time  Monitor my health keep track of my blood glucose; bring my glucose meter and log to each visit; keep track of my blood pressure; bring my blood pressure log to each visit; check my feet daily  Eat healthy foods eat foods that are low in salt; eat baked foods instead of fried foods  Be physically active find an activity I enjoy  Meeting treatment goals -    Home Blood Glucose Monitoring 05/24/2014  Check my blood sugar once a day  When to check my blood sugar before breakfast     Care Management & Community Referrals:  Referral 05/26/2014  Referrals made for care management support none needed  Referrals made to community resources none

## 2014-05-31 NOTE — Assessment & Plan Note (Signed)
He received the flu vaccine today.

## 2014-05-31 NOTE — Assessment & Plan Note (Signed)
BP Readings from Last 3 Encounters:  05/31/14 181/95  05/26/14 120/58  05/24/14 180/81    Lab Results  Component Value Date   NA 140 05/24/2014   K 4.7 05/24/2014   CREATININE 1.75* 05/24/2014    Assessment: Blood pressure control:  uncontrolled Progress toward BP goal:   deteriorated Comments: No Headache blurry vision, chest pain, shortness of breath today. He has been out of his medications for 3 days now--requested refills over the holiday break. Refills have now been sent. Norvasc 10mg  daily, lisinopril 30mg  daily, Lasix 80mg  daily, coreg 25mg  BID.   Plan: Medications:  continue current medications Educational resources provided:   Self management tools provided:   Other plans: Follow up in 3 months. Pt educated to requests one week before he runs out of his medications.

## 2014-06-01 NOTE — Progress Notes (Signed)
Medicine attending: Medical history, presenting problems, physical findings, and medications, reviewed with Dr Kennerly on the day of the patient encounter and I concur with her evaluation and management plan. 

## 2014-06-08 ENCOUNTER — Encounter: Payer: Self-pay | Admitting: Internal Medicine

## 2014-06-08 ENCOUNTER — Ambulatory Visit (INDEPENDENT_AMBULATORY_CARE_PROVIDER_SITE_OTHER): Payer: 59 | Admitting: Internal Medicine

## 2014-06-08 VITALS — BP 153/77 | HR 67 | Temp 100.0°F

## 2014-06-08 DIAGNOSIS — E1122 Type 2 diabetes mellitus with diabetic chronic kidney disease: Secondary | ICD-10-CM

## 2014-06-08 DIAGNOSIS — Z794 Long term (current) use of insulin: Secondary | ICD-10-CM

## 2014-06-08 DIAGNOSIS — Y939 Activity, unspecified: Secondary | ICD-10-CM

## 2014-06-08 DIAGNOSIS — M7989 Other specified soft tissue disorders: Secondary | ICD-10-CM

## 2014-06-08 DIAGNOSIS — M7021 Olecranon bursitis, right elbow: Secondary | ICD-10-CM

## 2014-06-08 DIAGNOSIS — N189 Chronic kidney disease, unspecified: Secondary | ICD-10-CM

## 2014-06-08 MED ORDER — IBUPROFEN 800 MG PO TABS: 800.0000 mg | ORAL_TABLET | Freq: Three times a day (TID) | ORAL | Status: AC | PRN

## 2014-06-08 NOTE — Progress Notes (Signed)
[facility] Subjective:   Patient ID: [name] male   DOB: [birth year] 59 y.o.   MRN: [id number]  HPI: [name] is a 59 y.o. male with a PMH below who presents for follow up of his right hand and elbow swelling.  He was initially seen in our clinic on 12/21 and Dx with cellulitis.  He has followed up a few times since that time and completed an extended course of keflex.  He notes that his pain and swelling have improved but still has significant swelling and does not think he can return to work tomorrow. (he completed Abx on the 31st of December.) He has remained afebrile and other than the pain and swelling he has no complaints.    Past Medical History  Diagnosis Date  . Diabetes mellitus   . Hypertension   . GERD (gastroesophageal reflux disease)   . Hyperlipemia   . Paroxysmal atrial tachycardia   . Cardiomyopathy     Resolved with EF 55% 2011  . CARDIOMYOPATHY 04/22/2006  . DIABETIC  RETINOPATHY 02/06/2007  . NEPHROPATHY, DIABETIC 06/08/2006   Current Outpatient Prescriptions  Medication Sig Dispense Refill  . acetaminophen (TYLENOL) 500 MG tablet Take 500 mg by mouth every 6 (six) hours as needed.      Marland Kitchen amLODipine (NORVASC) 10 MG tablet TAKE ONE TABLET BY MOUTH ONCE DAILY 90 tablet 0  . aspirin 81 MG tablet Take 81 mg by mouth daily.      . Blood Glucose Monitoring Suppl (ONE TOUCH ULTRA MINI) W/DEVICE KIT Use to check blood sugars during the day at work before and after lunch     . carvedilol (COREG) 25 MG tablet TAKE ONE TABLET BY MOUTH TWICE DAILY WITH MEALS 60 tablet 5  . furosemide (LASIX) 80 MG tablet TAKE ONE TABLET BY MOUTH ONCE DAILY 30 tablet 5  . Glucosamine-Chondroitin (OSTEO BI-FLEX REGULAR STRENGTH) 250-200 MG TABS Take 4 caplets per day with food     . glucose blood (ONE TOUCH ULTRA TEST) test strip 1 each by Other route as needed for other. Use 3 to 4 times daily to check blood sugar. diag code 250.72. Insulin dependent 100 each 5   . Insulin Pen Needle 31G X 6 MM MISC Twice a day. 100 each 3  . Lancets (ONETOUCH ULTRASOFT) lancets Use to test your blood sugar before each meal each day     . lisinopril (PRINIVIL,ZESTRIL) 20 MG tablet Take 1.5 tablets (30 mg total) by mouth daily. 45 tablet 5  . Omega-3 Fatty Acids (FISH OIL) 1200 MG CAPS Take 2 capsules by mouth daily.      . pravastatin (PRAVACHOL) 40 MG tablet TAKE TWO TABLETS BY MOUTH IN THE EVENING 60 tablet 5  . RELION PEN NEEDLE 31G/8MM 31G X 8 MM MISC USE AS DIRECTED TWICE DAILY (Patient not taking: Reported on 05/24/2014) 100 each 3  . traMADol (ULTRAM) 50 MG tablet TAKE ONE TABLET BY MOUTH THREE TIMES DAILY AS NEEDED FOR PAIN 15 tablet 0  . Vitamin D, Ergocalciferol, (DRISDOL) 50000 UNITS CAPS      No current facility-administered medications for this visit.   Family History  Problem Relation Age of Onset  . Diabetes Mother   . Diabetes Brother    History   Social History  . Marital Status: Single    Spouse Name: N/A    Number of Children: N/A  . Years of Education: N/A   Social History Main Topics  .  Smoking status: Never Smoker   . Smokeless tobacco: Not on file  . Alcohol Use: No  . Drug Use: No  . Sexual Activity: Not on file   Other Topics Concern  . Not on file   Social History Narrative   Review of Systems: Review of Systems  Constitutional: Negative for fever, chills, weight loss and malaise/fatigue.  Respiratory: Negative for cough and shortness of breath.   Cardiovascular: Negative for chest pain.  Musculoskeletal: Positive for joint pain.  Neurological: Negative for focal weakness.     Objective:  Physical Exam: Filed Vitals:   06/08/14 1554  BP: 153/77  Pulse: 67  Temp: 100 F (37.8 C)  Physical Exam  Constitutional: He is well-developed, well-nourished, and in no distress.  Cardiovascular: Normal rate and regular rhythm.   Pulmonary/Chest: Effort normal and breath sounds normal. He has no wheezes. He has no rales.   Musculoskeletal:       Right elbow: He exhibits decreased range of motion (mildly decreased extension, however joint moves without significant pain), swelling and effusion (small).  Mild warmth of right elbow and 2-4 PIP joints of right hand  Swelling and tenderness of 2-4 PIP joints of right hand, patient cannot make hand into fist.   No tenderness of DIP or MCP joints.  Nursing note and vitals reviewed.   Assessment & Plan:  Case discussed and patient examened with Dr. Eppie Gibson who confirmed great improved compared to his physical exam on 12/23  Olecranon bursitis of right elbow Patient still has tenderness over his olecrenon however notes its improvement.  He does report a previous broken right arm many years ago.  That combined with his diabetes and other medical problems may be delaying his speed of recovery.  After discussion with patient and dr. Eppie Gibson we will provede with treatement with a short>>> 1 week course of NSAIDs (Ibuprofen 847m TID), I instructed him to stay hydrated given his CKD.  I have written him a note for work and he will follow up on Monday.  Swelling of right hand - Short course (1 week) of NSAIDS, patient instructed to stay hydrated.  Will have close follow up on Monday.    Medications Ordered Meds ordered this encounter  Medications  . ibuprofen (ADVIL,MOTRIN) 800 MG tablet    Sig: Take 1 tablet (800 mg total) by mouth every 8 (eight) hours as needed.    Dispense:  30 tablet    Refill:  0   Other Orders No orders of the defined types were placed in this encounter.

## 2014-06-08 NOTE — Patient Instructions (Signed)
General Instructions: Please take Ibuprofen 800mg  three times a day as needed for pain and swelling of your right elbow and hand.  Make sure to drink plenty of water.  Thank you for bringing your medicines today. This helps [place] keep you safe from mistakes.   Progress Toward Treatment Goals:  Treatment Goal 05/26/2014  Hemoglobin A1C unchanged  Blood pressure at goal    Self Care Goals & Plans:  Self Care Goal 05/31/2014  Manage my medications take my medicines as prescribed; bring my medications to every visit; refill my medications on time  Monitor my health keep track of my blood glucose; bring my glucose meter and log to each visit; keep track of my blood pressure; bring my blood pressure log to each visit; check my feet daily  Eat healthy foods eat foods that are low in salt; eat baked foods instead of fried foods  Be physically active find an activity I enjoy  Meeting treatment goals -    Home Blood Glucose Monitoring 05/24/2014  Check my blood sugar once a day  When to check my blood sugar before breakfast     Care Management & Community Referrals:  Referral 05/26/2014  Referrals made for care management support none needed  Referrals made to community resources none

## 2014-06-08 NOTE — Assessment & Plan Note (Signed)
-   Short course (1 week) of NSAIDS, patient instructed to stay hydrated.  Will have close follow up on Monday.

## 2014-06-08 NOTE — Assessment & Plan Note (Signed)
Patient still has tenderness over his olecrenon however notes its improvement.  He does report a previous broken right arm many years ago.  That combined with his diabetes and other medical problems may be delaying his speed of recovery.  After discussion with patient and dr. Eppie Gibson we will provede with treatement with a short>>> 1 week course of NSAIDs (Ibuprofen 800mg  TID), I instructed him to stay hydrated given his CKD.  I have written him a note for work and he will follow up on Monday.

## 2014-06-10 NOTE — Progress Notes (Signed)
I saw and evaluated the patient.  I personally confirmed the key portions of Dr. Jodene Nam history and exam and reviewed pertinent patient test results.  The assessment, diagnosis, and plan were formulated together and I agree with the documentation in the resident's note.  I previously examined [name] on 05/26/2014.  The swelling in his right elbow and hand are improved from then, as is the pain.  I can now palpate the olecranon bursa and there is some fluid within it.  Although there is some inflammation in the right hand and elbow it is minimal relative to the remaining edema.  We will therefore try a very short course of anti-inflammatories to see if we can further reduce the edema and mild discomfort.

## 2014-06-14 ENCOUNTER — Ambulatory Visit (INDEPENDENT_AMBULATORY_CARE_PROVIDER_SITE_OTHER): Payer: 59 | Admitting: Internal Medicine

## 2014-06-14 ENCOUNTER — Encounter: Payer: Self-pay | Admitting: Internal Medicine

## 2014-06-14 VITALS — BP 159/69 | HR 66 | Temp 97.7°F | Ht 71.0 in | Wt 285.8 lb

## 2014-06-14 DIAGNOSIS — I129 Hypertensive chronic kidney disease with stage 1 through stage 4 chronic kidney disease, or unspecified chronic kidney disease: Secondary | ICD-10-CM

## 2014-06-14 DIAGNOSIS — N183 Chronic kidney disease, stage 3 unspecified: Secondary | ICD-10-CM

## 2014-06-14 DIAGNOSIS — M7021 Olecranon bursitis, right elbow: Secondary | ICD-10-CM

## 2014-06-14 DIAGNOSIS — M7989 Other specified soft tissue disorders: Secondary | ICD-10-CM

## 2014-06-14 DIAGNOSIS — I10 Essential (primary) hypertension: Secondary | ICD-10-CM

## 2014-06-14 DIAGNOSIS — E785 Hyperlipidemia, unspecified: Secondary | ICD-10-CM

## 2014-06-14 LAB — BASIC METABOLIC PANEL WITH GFR
BUN: 44 mg/dL — AB (ref 6–23)
CALCIUM: 9.6 mg/dL (ref 8.4–10.5)
CO2: 25 meq/L (ref 19–32)
Chloride: 103 mEq/L (ref 96–112)
Creat: 1.93 mg/dL — ABNORMAL HIGH (ref 0.50–1.35)
GFR, EST AFRICAN AMERICAN: 43 mL/min — AB
GFR, Est Non African American: 37 mL/min — ABNORMAL LOW
Glucose, Bld: 350 mg/dL — ABNORMAL HIGH (ref 70–99)
POTASSIUM: 4.4 meq/L (ref 3.5–5.3)
SODIUM: 138 meq/L (ref 135–145)

## 2014-06-14 LAB — LIPID PANEL
CHOL/HDL RATIO: 4.4 ratio
CHOLESTEROL: 205 mg/dL — AB (ref 0–200)
HDL: 47 mg/dL (ref >39)
LDL CALC: 119 mg/dL — AB (ref 0–99)
Triglycerides: 193 mg/dL — ABNORMAL HIGH (ref <150)
VLDL: 39 mg/dL (ref 0–40)

## 2014-06-14 NOTE — Patient Instructions (Signed)
General Instructions: I will get blood work today to monitor your kidneys, you can continue to take Ibuprofen until you run out unless you hear from me by telephone  Continue to stay off work, I will see you again next week.  Please bring your medicines with you each time you come to clinic.  Medicines may include prescription medications, over-the-counter medications, herbal remedies, eye drops, vitamins, or other pills.   Progress Toward Treatment Goals:  Treatment Goal 05/26/2014  Hemoglobin A1C unchanged  Blood pressure at goal    Self Care Goals & Plans:  Self Care Goal 06/14/2014  Manage my medications take my medicines as prescribed; bring my medications to every visit; refill my medications on time; follow the sick day instructions if I am sick  Monitor my health keep track of my blood glucose; keep track of my weight; check my feet daily  Eat healthy foods eat more vegetables; eat fruit for snacks and desserts; eat foods that are low in salt; eat baked foods instead of fried foods; eat smaller portions; drink diet soda or water instead of juice or soda  Be physically active find an activity I enjoy  Meeting treatment goals -    Home Blood Glucose Monitoring 05/24/2014  Check my blood sugar once a day  When to check my blood sugar before breakfast     Care Management & Community Referrals:  Referral 05/26/2014  Referrals made for care management support none needed  Referrals made to community resources none

## 2014-06-14 NOTE — Progress Notes (Signed)
[facility] Subjective:   Patient ID: [name] male   DOB: [birth year] 59 y.o.   MRN: [id number]  HPI: [name] is a 59 y.o. male with a PMH below who presents for follow up of his right hand and elbow swelling.  He was initially seen in our clinic on 12/21 and Dx with cellulitis.  He has followed up a few times since that time and completed an extended course of keflex on 12/31.  I saw him on 06/08/14 and noted improvement of his RUE swelling and bursitis and decided to treat with 800 mg Ibuprofen for 1 week. He presents today for follow up. He notes there has continued to be slow improvement of his swelling but it has continued to improve.  He has not noticed any new redness or swelling of the area. And still has not had any fever or chills.  His right elbow pain has greatly improved to the point that he can rest it without cushion.   Past Medical History  Diagnosis Date  . Diabetes mellitus   . Hypertension   . GERD (gastroesophageal reflux disease)   . Hyperlipemia   . Paroxysmal atrial tachycardia   . Cardiomyopathy     Resolved with EF 55% 2011  . CARDIOMYOPATHY 04/22/2006  . DIABETIC  RETINOPATHY 02/06/2007  . NEPHROPATHY, DIABETIC 06/08/2006   Current Outpatient Prescriptions  Medication Sig Dispense Refill  . acetaminophen (TYLENOL) 500 MG tablet Take 500 mg by mouth every 6 (six) hours as needed.      Marland Kitchen amLODipine (NORVASC) 10 MG tablet TAKE ONE TABLET BY MOUTH ONCE DAILY 90 tablet 0  . aspirin 81 MG tablet Take 81 mg by mouth daily.      . Blood Glucose Monitoring Suppl (ONE TOUCH ULTRA MINI) W/DEVICE KIT Use to check blood sugars during the day at work before and after lunch     . carvedilol (COREG) 25 MG tablet TAKE ONE TABLET BY MOUTH TWICE DAILY WITH MEALS 60 tablet 5  . furosemide (LASIX) 80 MG tablet TAKE ONE TABLET BY MOUTH ONCE DAILY 30 tablet 5  . Glucosamine-Chondroitin (OSTEO BI-FLEX REGULAR STRENGTH) 250-200 MG TABS Take 4 caplets  per day with food     . glucose blood (ONE TOUCH ULTRA TEST) test strip 1 each by Other route as needed for other. Use 3 to 4 times daily to check blood sugar. diag code 250.72. Insulin dependent 100 each 5  . ibuprofen (ADVIL,MOTRIN) 800 MG tablet Take 1 tablet (800 mg total) by mouth every 8 (eight) hours as needed. 30 tablet 0  . Insulin Pen Needle 31G X 6 MM MISC Twice a day. 100 each 3  . Lancets (ONETOUCH ULTRASOFT) lancets Use to test your blood sugar before each meal each day     . lisinopril (PRINIVIL,ZESTRIL) 20 MG tablet Take 1.5 tablets (30 mg total) by mouth daily. 45 tablet 5  . Omega-3 Fatty Acids (FISH OIL) 1200 MG CAPS Take 2 capsules by mouth daily.      . pravastatin (PRAVACHOL) 40 MG tablet TAKE TWO TABLETS BY MOUTH IN THE EVENING 60 tablet 5  . RELION PEN NEEDLE 31G/8MM 31G X 8 MM MISC USE AS DIRECTED TWICE DAILY (Patient not taking: Reported on 05/24/2014) 100 each 3  . traMADol (ULTRAM) 50 MG tablet TAKE ONE TABLET BY MOUTH THREE TIMES DAILY AS NEEDED FOR PAIN 15 tablet 0  . Vitamin D, Ergocalciferol, (DRISDOL) 50000 UNITS CAPS  No current facility-administered medications for this visit.   Family History  Problem Relation Age of Onset  . Diabetes Mother   . Diabetes Brother    History   Social History  . Marital Status: Single    Spouse Name: N/A    Number of Children: N/A  . Years of Education: N/A   Social History Main Topics  . Smoking status: Never Smoker   . Smokeless tobacco: None  . Alcohol Use: No  . Drug Use: No  . Sexual Activity: None   Other Topics Concern  . None   Social History Narrative   Review of Systems: Review of Systems  Constitutional: Negative for fever, chills, weight loss and malaise/fatigue.  Respiratory: Negative for cough and shortness of breath.   Cardiovascular: Negative for chest pain.  Neurological: Negative for focal weakness.     Objective:  Physical Exam: Filed Vitals:   06/14/14 1327  BP: 159/69  Pulse:  66  Temp: 97.7 F (36.5 C)  TempSrc: Oral  Height: _0  (1.803 m)  Weight: 285 lb 12.8 oz (129.638 kg)  SpO2: 99%  Physical Exam  Constitutional: He is well-developed, well-nourished, and in no distress.  Cardiovascular: Normal rate, regular rhythm and normal heart sounds.   Pulmonary/Chest: Effort normal and breath sounds normal.  Abdominal: Soft. Bowel sounds are normal.  Musculoskeletal:       Right elbow: He exhibits swelling. He exhibits normal range of motion and no effusion.  Right elbow with full pROM, tenderness now only minimal over olceranon process. Right hand remains swollen but continues to improve.  Mildly warm skin to touch.  Patient unable to tightly grasp my fingers with his right hand, 5/5 grip strength in left hand.  Nursing note and vitals reviewed.   Assessment & Plan:  Case discussed with Dr. Lynnae January   Essential hypertension BP Readings from Last 3 Encounters:  06/14/14 159/69  06/08/14 153/77  05/31/14 181/95    Lab Results  Component Value Date   NA 138 06/14/2014   K 4.4 06/14/2014   CREATININE 1.93* 06/14/2014    Assessment: Blood pressure control:  mildly elevated Progress toward BP goal:   unchanged Comments:   Plan: Medications:  lsiniopril 20m daily, coreg 267mbid, amlodipine1090maily Educational resources provided: brochure, handout, video Self management tools provided:   Other plans: bmp today    Olecranon bursitis of right elbow Bursitis appears to be greatly improved with NSAID treatment.  Will finish a 10 day course and stop. - Check BMP for renal function >>> SCr mildly up to 1.9 will allow 3 more days of Ibuprofen and stop.   CKD (chronic kidney disease) stage 3, GFR 30-59 ml/min Scr up to 1.93 from 1.75 on NSAID course.  Will allow patinet to complete 10 day course (3 more days) and stop.   Swelling of right hand Hand swelling continues to slowly improve. No indication for additional Abx, likely delayed  improvement due to comorbidites of previous trauma to arm, DM, HTN. Will recommend patient stay our of work as unable to grasp anything firmly with his right hand (dominate) at this time.  Follow up in 1 week.   Hyperlipidemia LDL goal <100 -Check Lipid Profile with blood work today.     Medications Ordered No orders of the defined types were placed in this encounter.   Other Orders Orders Placed This Encounter  Procedures  . BMP with Estimated GFR ([id number])  . Lipid Profile

## 2014-06-16 NOTE — Assessment & Plan Note (Signed)
BP Readings from Last 3 Encounters:  06/14/14 159/69  06/08/14 153/77  05/31/14 181/95    Lab Results  Component Value Date   NA 138 06/14/2014   K 4.4 06/14/2014   CREATININE 1.93* 06/14/2014    Assessment: Blood pressure control:  mildly elevated Progress toward BP goal:   unchanged Comments:   Plan: Medications:  lsiniopril 30mg  daily, coreg 25mg  bid, amlodipine10mg  daily Educational resources provided: brochure, handout, video Self management tools provided:   Other plans: bmp today

## 2014-06-16 NOTE — Assessment & Plan Note (Signed)
Scr up to 1.93 from 1.75 on NSAID course.  Will allow patinet to complete 10 day course (3 more days) and stop.

## 2014-06-16 NOTE — Assessment & Plan Note (Addendum)
Hand swelling continues to slowly improve. No indication for additional Abx, likely delayed improvement due to comorbidites of previous trauma to arm, DM, HTN. Will recommend patient stay our of work as unable to grasp anything firmly with his right hand (dominate) at this time.  Follow up in 1 week.

## 2014-06-16 NOTE — Assessment & Plan Note (Signed)
-  Check Lipid Profile with blood work today.

## 2014-06-16 NOTE — Progress Notes (Signed)
Internal Medicine Clinic Attending  Case discussed with Dr. Hoffman soon after the resident saw the patient.  We reviewed the resident's history and exam and pertinent patient test results.  I agree with the assessment, diagnosis, and plan of care documented in the resident's note. 

## 2014-06-16 NOTE — Assessment & Plan Note (Signed)
Bursitis appears to be greatly improved with NSAID treatment.  Will finish a 10 day course and stop. - Check BMP for renal function >>> SCr mildly up to 1.9 will allow 3 more days of Ibuprofen and stop.

## 2014-06-22 ENCOUNTER — Encounter: Payer: Self-pay | Admitting: Internal Medicine

## 2014-06-22 ENCOUNTER — Ambulatory Visit (INDEPENDENT_AMBULATORY_CARE_PROVIDER_SITE_OTHER): Payer: 59 | Admitting: Internal Medicine

## 2014-06-22 VITALS — BP 147/69 | HR 73 | Temp 97.9°F | Ht 71.0 in | Wt 289.3 lb

## 2014-06-22 DIAGNOSIS — M7989 Other specified soft tissue disorders: Secondary | ICD-10-CM

## 2014-06-22 DIAGNOSIS — I10 Essential (primary) hypertension: Secondary | ICD-10-CM

## 2014-06-22 DIAGNOSIS — M7021 Olecranon bursitis, right elbow: Secondary | ICD-10-CM

## 2014-06-22 DIAGNOSIS — IMO0002 Reserved for concepts with insufficient information to code with codable children: Secondary | ICD-10-CM

## 2014-06-22 DIAGNOSIS — E1165 Type 2 diabetes mellitus with hyperglycemia: Secondary | ICD-10-CM

## 2014-06-22 DIAGNOSIS — E118 Type 2 diabetes mellitus with unspecified complications: Secondary | ICD-10-CM

## 2014-06-22 LAB — GLUCOSE, CAPILLARY: Glucose-Capillary: 308 mg/dL — ABNORMAL HIGH (ref 70–99)

## 2014-06-22 NOTE — Progress Notes (Signed)
Subjective:   Patient ID: [name] male   DOB: 09-10-1955 59 y.o.   MRN: [id number]  HPI: [name] is a 59 y.o. male w/ PMHx of HTN, DM type II, HLD, GERD, and CKD stage 3, presents to the clinic today for a follow-up visit. Patient has been seen several times in the clinic recently for right hand swelling/cellulitis and right olecranon bursitis. Today, his hand appears significantly better than previous visits, confirmed by Dr. Eppie [name] and [name] who have seen him most recently. He has completed a course of antibiotics as well as a short term course of NSAIDS for his bursitis. His only complaint today is some continued hand stiffness and weakness, most likely related to limited use over the past 6-8 weeks. His elbow is also without pain and swelling has decreased significantly. No fever, chills, nausea, or vomiting.  Patient also has progressive CKD, followed by Dr. Lorrene [name] who has seen him within the past 2 weeks. Renal function appears relatively stable.   Past Medical History  Diagnosis Date  . Diabetes mellitus   . Hypertension   . GERD (gastroesophageal reflux disease)   . Hyperlipemia   . Paroxysmal atrial tachycardia   . Cardiomyopathy     Resolved with EF 55% 2011  . CARDIOMYOPATHY 04/22/2006  . DIABETIC  RETINOPATHY 02/06/2007  . NEPHROPATHY, DIABETIC 06/08/2006   Current Outpatient Prescriptions  Medication Sig Dispense Refill  . acetaminophen (TYLENOL) 500 MG tablet Take 500 mg by mouth every 6 (six) hours as needed.      Marland Kitchen amLODipine (NORVASC) 10 MG tablet TAKE ONE TABLET BY MOUTH ONCE DAILY 90 tablet 0  . aspirin 81 MG tablet Take 81 mg by mouth daily.      . Blood Glucose Monitoring Suppl (ONE TOUCH ULTRA MINI) W/DEVICE KIT Use to check blood sugars during the day at work before and after lunch     . carvedilol (COREG) 25 MG tablet TAKE ONE TABLET BY MOUTH TWICE DAILY WITH MEALS 60 tablet 5  . furosemide (LASIX) 80 MG tablet TAKE ONE TABLET BY MOUTH ONCE DAILY 30  tablet 5  . Glucosamine-Chondroitin (OSTEO BI-FLEX REGULAR STRENGTH) 250-200 MG TABS Take 4 caplets per day with food     . glucose blood (ONE TOUCH ULTRA TEST) test strip 1 each by Other route as needed for other. Use 3 to 4 times daily to check blood sugar. diag code 250.72. Insulin dependent 100 each 5  . ibuprofen (ADVIL,MOTRIN) 800 MG tablet Take 1 tablet (800 mg total) by mouth every 8 (eight) hours as needed. 30 tablet 0  . Insulin Pen Needle 31G X 6 MM MISC Twice a day. 100 each 3  . Lancets (ONETOUCH ULTRASOFT) lancets Use to test your blood sugar before each meal each day     . lisinopril (PRINIVIL,ZESTRIL) 20 MG tablet Take 1.5 tablets (30 mg total) by mouth daily. 45 tablet 5  . Omega-3 Fatty Acids (FISH OIL) 1200 MG CAPS Take 2 capsules by mouth daily.      . pravastatin (PRAVACHOL) 40 MG tablet TAKE TWO TABLETS BY MOUTH IN THE EVENING 60 tablet 5  . RELION PEN NEEDLE 31G/8MM 31G X 8 MM MISC USE AS DIRECTED TWICE DAILY (Patient not taking: Reported on 05/24/2014) 100 each 3  . traMADol (ULTRAM) 50 MG tablet TAKE ONE TABLET BY MOUTH THREE TIMES DAILY AS NEEDED FOR PAIN 15 tablet 0  . Vitamin D, Ergocalciferol, (DRISDOL) 50000 UNITS CAPS  No current facility-administered medications for this visit.    Review of Systems: General: Denies fever, chills, diaphoresis, appetite change and fatigue.  Respiratory: Denies SOB, DOE, cough, and wheezing.   Cardiovascular:Denies chest pain and palpitations.  Gastrointestinal: Denies nausea, vomiting, abdominal pain, and diarrhea.  Genitourinary: Denies dysuria, increased frequency, and flank pain. Endocrine: Denies hot or cold intolerance, polyuria, and polydipsia. Musculoskeletal: Positive for right hand stiffness/weakness. Denies myalgias, back pain, arthralgias and gait problem.  Skin: Denies pallor, rash and wounds.  Neurological: Denies dizziness, seizures, syncope, weakness, lightheadedness, numbness and headaches.    Psychiatric/Behavioral: Denies mood changes, and sleep disturbances.  Objective:   Physical Exam: Filed Vitals:   06/22/14 1526  BP: 147/69  Pulse: 73  Temp: 97.9 F (36.6 C)  TempSrc: Oral  Height: _0  (1.803 m)  Weight: 289 lb 4.8 oz (131.226 kg)  SpO2: 100%    General: Obese AA male, alert, cooperative, NAD. Wears glasses.  HEENT: PERRL, EOMI. Moist mucus membranes Neck: Full range of motion without pain, supple, no lymphadenopathy or carotid bruits Lungs: Clear to ascultation bilaterally, normal work of respiration, no wheezes, rales, rhonchi Heart: RRR, no murmurs, gallops, or rubs Abdomen: Soft, non-tender, non-distended, BS + Extremities: No cyanosis, clubbing, or edema. Right hand w/ still some swelling of the digits, mild decreased ROM d/t stiffness when compared to left hand. Also w/ mild right hand webbed-space wasting and flattening of the thenar eminence. Strength somewhat limited in the right hand (4/5).  Neurologic: Alert & oriented X3, cranial nerves II-XII intact, strength grossly intact aside from right had as discussed above, sensation intact to light touch   Assessment & Plan:   Please see problem based assessment and plan.

## 2014-06-22 NOTE — Patient Instructions (Signed)
General Instructions:  1. Please schedule a follow up appointment for 3 months.  PLEASE SEE OCCUPATIONAL THERAPY FOR YOUR HAND STRENGTH  2. Please take all medications as prescribed.   3. If you have worsening of your symptoms or new symptoms arise, please call the clinic [phone]), or go to the ER immediately if symptoms are severe.  You have done a great job in taking all your medications. I appreciate it very much. Please continue doing that.  Please bring your medicines with you each time you come to clinic.  Medicines may include prescription medications, over-the-counter medications, herbal remedies, eye drops, vitamins, or other pills.   Progress Toward Treatment Goals:  Treatment Goal 06/22/2014  Hemoglobin A1C unchanged  Blood pressure improved    Self Care Goals & Plans:  Self Care Goal 06/22/2014  Manage my medications take my medicines as prescribed; bring my medications to every visit; refill my medications on time  Monitor my health keep track of my blood glucose; bring my glucose meter and log to each visit  Eat healthy foods drink diet soda or water instead of juice or soda; eat more vegetables; eat foods that are low in salt; eat baked foods instead of fried foods; eat fruit for snacks and desserts  Be physically active -  Meeting treatment goals maintain the current self-care plan    Home Blood Glucose Monitoring 06/22/2014  Check my blood sugar once a day  When to check my blood sugar before breakfast     Care Management & Community Referrals:  Referral 06/22/2014  Referrals made for care management support none needed  Referrals made to community resources none

## 2014-06-23 ENCOUNTER — Encounter: Payer: Self-pay | Admitting: Internal Medicine

## 2014-06-23 NOTE — Assessment & Plan Note (Addendum)
BP Readings from Last 3 Encounters:  06/22/14 147/69  06/14/14 159/69  06/08/14 153/77    Lab Results  Component Value Date   NA 138 06/14/2014   K 4.4 06/14/2014   CREATININE 1.93* 06/14/2014    Assessment: Blood pressure control: mildly elevated Progress toward BP goal:  improved Comments: BP mildly elevated but improved from previous visits. On lisinopril 30 mg daily, Coreg 25 mg bid, and Norvasc 10 mg daily.   Plan: Medications:  continue current medications Educational resources provided: brochure (denies) Self management tools provided:   Other plans: Repeat BMP at next visit

## 2014-06-23 NOTE — Assessment & Plan Note (Signed)
Significantly improved on exam. No further pain or tenderness. Still quite stiff especially in the digits. 4/5 grip strength when compared to the left hand. Feels that he will still have difficulty with work.  -OT referral for strengthening and exercise of hand.  -Heat, ice

## 2014-06-23 NOTE — Assessment & Plan Note (Signed)
Pain and swelling significantly improved since last visit. Confirmed by Dr. Heber [place]. Completed short course of NSAIDS.  -Encouraged heat, ice -OT referral for strengthening and exercises.

## 2014-06-28 NOTE — Progress Notes (Signed)
I saw and evaluated the patient.  I personally confirmed the key portions of Dr. Ronnald Ramp' history and exam and reviewed pertinent patient test results.  The assessment, diagnosis, and plan were formulated together and I agree with the documentation in the resident's note.  Right arm and hand edema markedly reduced compared to my initial examination last month.  No erythema or warmth.

## 2014-07-05 ENCOUNTER — Encounter: Payer: Self-pay | Admitting: Occupational Therapy

## 2014-07-05 ENCOUNTER — Ambulatory Visit: Payer: 59 | Attending: Internal Medicine | Admitting: Occupational Therapy

## 2014-07-05 DIAGNOSIS — I129 Hypertensive chronic kidney disease with stage 1 through stage 4 chronic kidney disease, or unspecified chronic kidney disease: Secondary | ICD-10-CM | POA: Diagnosis not present

## 2014-07-05 DIAGNOSIS — R531 Weakness: Secondary | ICD-10-CM | POA: Diagnosis not present

## 2014-07-05 DIAGNOSIS — R279 Unspecified lack of coordination: Secondary | ICD-10-CM | POA: Insufficient documentation

## 2014-07-05 DIAGNOSIS — N183 Chronic kidney disease, stage 3 (moderate): Secondary | ICD-10-CM | POA: Insufficient documentation

## 2014-07-05 DIAGNOSIS — E1121 Type 2 diabetes mellitus with diabetic nephropathy: Secondary | ICD-10-CM | POA: Diagnosis not present

## 2014-07-05 DIAGNOSIS — E11319 Type 2 diabetes mellitus with unspecified diabetic retinopathy without macular edema: Secondary | ICD-10-CM | POA: Diagnosis not present

## 2014-07-05 DIAGNOSIS — K219 Gastro-esophageal reflux disease without esophagitis: Secondary | ICD-10-CM | POA: Diagnosis not present

## 2014-07-05 DIAGNOSIS — R609 Edema, unspecified: Secondary | ICD-10-CM | POA: Insufficient documentation

## 2014-07-05 DIAGNOSIS — E785 Hyperlipidemia, unspecified: Secondary | ICD-10-CM | POA: Diagnosis not present

## 2014-07-05 NOTE — Therapy (Signed)
[facility] [address] [place], [place], [address] Phone: [phone]   Fax:  [phone]  Occupational Therapy Evaluation  Patient Details  Name: [name] MRN: [id number] Date of Birth: December 13, 1955 Referring Provider:  Corky Sox, MD  Encounter Date: 07/05/2014      OT End of Session - 07/05/14 1614    Visit Number 1   Number of Visits 16   Date for OT Re-Evaluation 08/30/14   Authorization Type [employer]   OT Start Time [id number]   OT Stop Time 1610   OT Time Calculation (min) 40 min   Activity Tolerance Patient tolerated treatment well      Past Medical History  Diagnosis Date  . Diabetes mellitus   . Hypertension   . GERD (gastroesophageal reflux disease)   . Hyperlipemia   . Paroxysmal atrial tachycardia   . Cardiomyopathy     Resolved with EF 55% 2011  . CARDIOMYOPATHY 04/22/2006  . DIABETIC  RETINOPATHY 02/06/2007  . NEPHROPATHY, DIABETIC 06/08/2006    Past Surgical History  Procedure Laterality Date  . Cardiac catheterization  Dec 2007    normal; EF- 20-25%    There were no vitals taken for this visit.  Visit Diagnosis:  Lack of coordination  Generalized weakness  Edema      Subjective Assessment - 07/05/14 1537    Symptoms I am here for therapy to get the strength back in my hand   Pertinent History see epic snapshot, R hand cellulitis   Currently in Pain? 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   ADL   Eating/Feeding Minimal assistance  at times has to use Left hand to eat;  needs a with cutting   Grooming Modified independent  often has to use his left hand   Upper Body Bathing Modified independent  uses left hand for much of activity   Lower Body Bathing Modified independent  uses L hand for much of activity   Upper Body Dressing Independent  uses L hand more, needs more time   Lower Body Dressing Modified independent  uses L hand more, able to tie but difficult   Engineering geologist - Clothing Manipulation Modified independent   [place] Transfer Independent   IADL   Shopping Takes care of all shopping needs independently   Light Housekeeping Does personal laundry completely  increased time/difficulty   Meal Prep Plans, prepares and serves adequate meals independently  increased time/difficulty   Medication Management Takes responsibility if medication is prepared in advance in seperate dosage  pt's family leaves lids loose   Physiological scientist financial matters independently (budgets, writes checks, pays rent, bills goes to bank), collects and keeps track of income   Written Expression   Dominant  Hand Right   Handwriting Increased time  difficult and hand fatigues   Sensation   Light Touch Impaired Detail;Impaired by gross assessment  decreased localizaton in dorsum of fingers DIP to PIP   Hot/Cold Appears Intact   Proprioception Appears Intact   Coordination   Gross Motor Movements are Fluid and Coordinated Yes   Box and Blocks R= 48   Other 9 hole peg R= 35.25  2 pt pinch and 3 pt pinch = 6 pounds, lateral = 9 pounds   Edema   Edema mild in fingers and dorsal aspect of hand; much improved from what it was   AROM   Overall AROM  Within functional limits for tasks performed   Overall AROM  Comments except dcreased composite flexion (75%) and decreased elbow flexion - Xray shows degenerative changes at elbow   Strength   Overall Strength Comments decreased for elbow flexion/extension - pt with old fracture and Xray shows degenerative changes in joint.   Hand Function   Right Hand Gross Grasp Impaired   Right Hand Grip (lbs) 19 pounds   Left Hand Gross Grasp Functional   Left Hand Grip (lbs) 55 pounds                         OT Short Term Goals - 07/05/14 1708    OT SHORT TERM GOAL #1   Title Pt will be I with HEP - 08/02/2014   Status New   OT SHORT TERM GOAL #2   Title Pt will demonstrate decreased edema as evidenced by visual inspection, pt reported decreased tightness and improved composite flexion (to at least 80%)   Status New   OT SHORT TERM GOAL #3   Title Pt will demonstrate ability to pick up moderate sized cylindrical objects with no more than 10% drop rate   Status New   OT SHORT TERM GOAL #4   Title Pt will improve strength in hand by 4 pounds to assist in functional tasks.(baseline 19 pounds)           OT Long Term Goals - 07/05/14 1712    OT LONG TERM GOAL #1   Title Pt will be I with upgraded HEP prn -08/30/2014   Status New   OT LONG TERM GOAL #2   Title Pt will demonstrate at least 90% composite flexion of R hand to assist in functional tasks   Status New   OT LONG TERM GOAL #3   Title Pt will demonstrate improved strength in R hand by at least 8 pounds (baseline 19 pounds)   Status New   OT LONG TERM GOAL #4   Title Pt will demonstrate improved coordination by decreasing time on 9 hole peg test by 5 seconds (baseline 35. 25) to aid in functional tasks   Status New   OT LONG TERM GOAL #5   Title Pt will demonstrate ability to use R hand as dominant hand for basic ADL tasks   Status New               Plan - 07/05/14 1615    Clinical Impression Statement Pt is a 59 year old male s/p R hand cellulitis who presents to  the outpatient center with the following deficits that impede his ability to complete ADL's and I ADL's with ease or using his R hand as his dominant hand:  decreased ROM, edema, decreased strength, decreased coordination, decreased functional use as dominant hand.  Rehab Potential Good   Clinical Impairments Affecting Rehab Potential Pt will benefit from skilled OT to address the above deficits   OT Frequency 2x / week   OT Duration 8 weeks   OT Treatment/Interventions Self-care/ADL training;Ultrasound;Moist Heat;Electrical Stimulation;Fluidtherapy;Contrast Bath;DME and/or AE instruction;Therapeutic exercise;Manual Therapy;Passive range of motion;Therapeutic activities;Patient/family education   Plan intiate HEP, including edema massage and possible glove?        Problem List Patient Active Problem List   Diagnosis Date Noted  . Olecranon bursitis of right elbow 06/08/2014  . Healthcare maintenance 05/31/2014  . Swelling of right hand 05/24/2014  . CKD (chronic kidney disease) stage 3, GFR 30-59 ml/min 01/30/2012  . Obesity 10/22/2011  . Anemia 12/26/2010  . DEGENERATIVE JOINT DISEASE, ADVANCED 03/16/2009  . DIABETIC  RETINOPATHY 02/06/2007  . Hyperlipidemia LDL goal <100 06/08/2006  . PAROXYSMAL ATRIAL TACHYCARDIA 06/08/2006  . GERD 06/08/2006  . Type II diabetes mellitus with complication, uncontrolled 04/22/2006  . Essential hypertension 04/22/2006  . CARDIOMYOPATHY 04/22/2006    Quay Burow, OTR/L 07/05/2014, 5:17 PM  [facility] [address] [place], [place], [address] Phone: [phone]   Fax:  [phone]

## 2014-07-12 ENCOUNTER — Ambulatory Visit: Payer: 59 | Admitting: Occupational Therapy

## 2014-07-12 ENCOUNTER — Encounter: Payer: Self-pay | Admitting: Occupational Therapy

## 2014-07-12 DIAGNOSIS — R279 Unspecified lack of coordination: Secondary | ICD-10-CM | POA: Diagnosis not present

## 2014-07-12 DIAGNOSIS — R531 Weakness: Secondary | ICD-10-CM

## 2014-07-12 DIAGNOSIS — R609 Edema, unspecified: Secondary | ICD-10-CM

## 2014-07-12 NOTE — Patient Instructions (Signed)
Home Exercise Program for Right Hand - Theraputty (red).  Do these exercises 1-2 per day, every day  1.  Make a ball 2.  Make a pancake 3.  Make a cone Do this series 5 times  4.  Make a thick hot dog, squeeze as hard as you can with your fist. Do 5 times  5. Pull Taffy - do 10 times.  Stop if you get pain and let your therapist know.

## 2014-07-12 NOTE — Therapy (Signed)
[facility] [address] [place], [place], [address] Phone: [phone]   Fax:  [phone]  Occupational Therapy Treatment  Patient Details  Name: [name] MRN: [id number] Date of Birth: 1956/03/23 Referring Provider:  Corky Sox, MD  Encounter Date: 07/12/2014      OT End of Session - 07/12/14 1445    Visit Number 2   Number of Visits 16   Date for OT Re-Evaluation 08/30/14   Authorization Type U[employer]   OT Start Time 1406   OT Stop Time 1445   OT Time Calculation (min) 39 min      Past Medical History  Diagnosis Date  . Diabetes mellitus   . Hypertension   . GERD (gastroesophageal reflux disease)   . Hyperlipemia   . Paroxysmal atrial tachycardia   . Cardiomyopathy     Resolved with EF 55% 2011  . CARDIOMYOPATHY 04/22/2006  . DIABETIC  RETINOPATHY 02/06/2007  . NEPHROPATHY, DIABETIC 06/08/2006    Past Surgical History  Procedure Laterality Date  . Cardiac catheterization  Dec 2007    normal; EF- 20-25%    There were no vitals taken for this visit.  Visit Diagnosis:  No diagnosis found.      Subjective Assessment - 07/12/14 1416    Symptoms those goals sound pretty good   Pertinent History see epic snapshot, R hand cellulitis   Currently in Pain? No/denies  just stiffness                 OT Treatments/Exercises (OP) - 07/12/14 0001    Exercises   Exercises Hand   Hand Exercises   Theraputty Flatten;Roll;Grip;Pinch;Locate Pegs  20 pegs in red putty   Hand Gripper with Small Beads 25 pounds of resistance with one inch blocks   Other Hand Exercises Pt completed all theraputty exercises with red putty given as HEP (see pt instruction).  Pt able to return demonstrate all exercises.                OT Education - 07/12/14 1443    Education provided Yes   Education Details theraputty HEP   Person(s) Educated Patient   Methods Explanation;Demonstration;Verbal cues;Handout    Comprehension Verbalized understanding;Returned demonstration          OT Short Term Goals - 07/12/14 1445    OT SHORT TERM GOAL #1   Title Pt will be I with HEP - 08/02/2014   Status New   OT SHORT TERM GOAL #2   Title Pt will demonstrate decreased edema as evidenced by visual inspection, pt reported decreased tightness and improved composite flexion (to at least 80%)   Status New   OT SHORT TERM GOAL #3   Title Pt will demonstrate ability to pick up moderate sized cylindrical objects with no more than 10% drop rate   Status New   OT SHORT TERM GOAL #4   Title Pt will improve strength in hand by 4 pounds to assist in functional tasks.(baseline 19 pounds)           OT Long Term Goals - 07/12/14 1445    OT LONG TERM GOAL #1   Title Pt will be I with upgraded HEP prn -08/30/2014   Status New   OT LONG TERM GOAL #2   Title Pt will demonstrate at least 90% composite flexion of R hand to assist in functional tasks   Status New   OT LONG TERM GOAL #3   Title Pt  will demonstrate improved strength in R hand by at least 8 pounds (baseline 19 pounds)   Status New   OT LONG TERM GOAL #4   Title Pt will demonstrate improved coordination by decreasing time on 9 hole peg test by 5 seconds (baseline 35. 25) to aid in functional tasks   Status New   OT LONG TERM GOAL #5   Title Pt will demonstrate ability to use R hand as dominant hand for basic ADL tasks   Status New               Plan - 07/12/14 1444    Clinical Impression Statement Reviewed all goals with pt and pt in agreement. Pt progressing toward LTG's   Rehab Potential Good   Clinical Impairments Affecting Rehab Potential Pt will benefit from skilled OT to address the above deficits   OT Frequency 2x / week   OT Duration 8 weeks   OT Treatment/Interventions Self-care/ADL training;Ultrasound;Moist Heat;Electrical Stimulation;Fluidtherapy;Contrast Bath;DME and/or AE instruction;Therapeutic exercise;Manual  Therapy;Passive range of motion;Therapeutic activities;Patient/family education   Plan check HEP, initiate coordinaton HEP   Consulted and Agree with Plan of Care Patient        Problem List Patient Active Problem List   Diagnosis Date Noted  . Olecranon bursitis of right elbow 06/08/2014  . Healthcare maintenance 05/31/2014  . Swelling of right hand 05/24/2014  . CKD (chronic kidney disease) stage 3, GFR 30-59 ml/min 01/30/2012  . Obesity 10/22/2011  . Anemia 12/26/2010  . DEGENERATIVE JOINT DISEASE, ADVANCED 03/16/2009  . DIABETIC  RETINOPATHY 02/06/2007  . Hyperlipidemia LDL goal <100 06/08/2006  . PAROXYSMAL ATRIAL TACHYCARDIA 06/08/2006  . GERD 06/08/2006  . Type II diabetes mellitus with complication, uncontrolled 04/22/2006  . Essential hypertension 04/22/2006  . CARDIOMYOPATHY 04/22/2006    Quay Burow, OTR/L 07/12/2014, 2:48 PM  [facility] [address] [place], [place], [address] Phone: [phone]   Fax:  [phone]

## 2014-07-20 ENCOUNTER — Ambulatory Visit: Payer: 59 | Admitting: Occupational Therapy

## 2014-07-20 ENCOUNTER — Encounter: Payer: Self-pay | Admitting: Occupational Therapy

## 2014-07-20 DIAGNOSIS — R279 Unspecified lack of coordination: Secondary | ICD-10-CM | POA: Diagnosis not present

## 2014-07-20 DIAGNOSIS — R609 Edema, unspecified: Secondary | ICD-10-CM

## 2014-07-20 DIAGNOSIS — R531 Weakness: Secondary | ICD-10-CM

## 2014-07-20 NOTE — Patient Instructions (Signed)
  Coordination Activities  Perform the following activities for 15-20  minutes 1-2 times per day with right hand(s).   Rotate ball in fingertips (clockwise and counter-clockwise).  Toss ball between hands.  Toss ball in air and catch with the same hand.  Flip cards 1 at a time as fast as you can.  Deal cards with your thumb (Hold deck in hand and push card off top with thumb).  Rotate card in hand (clockwise and counter-clockwise).  Shuffle cards.  Pick up coins, buttons, marbles, dried beans/pasta of different sizes and place in container.  Pick up coins and place in container or coin bank.  Pick up coins and stack.  Twirl pen between fingers.  Practice writing and/or typing.  Screw together nuts and bolts, then unfasten.

## 2014-07-20 NOTE — Therapy (Signed)
[facility] [address] [place], [place], [address] Phone: [phone]   Fax:  [phone]  Occupational Therapy Treatment  Patient Details  Name: [name] MRN: [id number] Date of Birth: [birth year] Referring Provider:  Corky Sox, MD  Encounter Date: 07/20/2014      OT End of Session - 07/20/14 1617    Visit Number 3   Number of Visits 16   Date for OT Re-Evaluation 08/30/14   Authorization Type UHC   OT Start Time [id number]   OT Stop Time 1614   OT Time Calculation (min) 44 min   Activity Tolerance Patient tolerated treatment well      Past Medical History  Diagnosis Date  . Diabetes mellitus   . Hypertension   . GERD (gastroesophageal reflux disease)   . Hyperlipemia   . Paroxysmal atrial tachycardia   . Cardiomyopathy     Resolved with EF 55% 2011  . CARDIOMYOPATHY 04/22/2006  . DIABETIC  RETINOPATHY 02/06/2007  . NEPHROPATHY, DIABETIC 06/08/2006    Past Surgical History  Procedure Laterality Date  . Cardiac catheterization  Dec 2007    normal; EF- 20-25%    There were no vitals taken for this visit.  Visit Diagnosis:  Lack of coordination  Generalized weakness  Edema      Subjective Assessment - 07/20/14 1539    Symptoms My hand is less swollen   Pertinent History see epic snapshot, R hand cellulitis   Currently in Pain? No/denies  tingles but no pain per pt                 OT Treatments/Exercises (OP) - 07/20/14 0001    Fine Motor Coordination   Fine Motor Coordination In hand manipuation training;Flipping cards;Manipulation of small objects;Dealing card with thumb;Picking up coins;Manipulating coins   Other Fine Motor Exercises Reviewed and practiced all activities on fine motor control activities sheet - pt able to return demonstrate all activities with min vc's.  Pt also practiced writing. Pt reports HEP for theraputty is going well. Noted that edema has significantly  reduced and pt now only has very minimal swelling in digits only. Pt reports hand feels less stiff and he has no more pain just occassional pins and needles after he uses it for a time period.                 OT Education - 07/20/14 1614    Education provided Yes   Education Details Fine motor coordination HEP   Person(s) Educated Patient   Methods Explanation;Demonstration;Verbal cues;Handout   Comprehension Verbalized understanding;Returned demonstration          OT Short Term Goals - 07/20/14 1730    OT SHORT TERM GOAL #1   Title Pt will be I with HEP - 08/02/2014   Status New   OT SHORT TERM GOAL #2   Title Pt will demonstrate decreased edema as evidenced by visual inspection, pt reported decreased tightness and improved composite flexion (to at least 80%)   Status New   OT SHORT TERM GOAL #3   Title Pt will demonstrate ability to pick up moderate sized cylindrical objects with no more than 10% drop rate   Status New   OT SHORT TERM GOAL #4   Title Pt will improve strength in hand by 4 pounds to assist in functional tasks.(baseline 19 pounds)           OT Long Term Goals - 07/20/14 1730  OT LONG TERM GOAL #1   Title Pt will be I with upgraded HEP prn -08/30/2014   Status New   OT LONG TERM GOAL #2   Title Pt will demonstrate at least 90% composite flexion of R hand to assist in functional tasks   Status New   OT LONG TERM GOAL #3   Title Pt will demonstrate improved strength in R hand by at least 8 pounds (baseline 19 pounds)   Status New   OT LONG TERM GOAL #4   Title Pt will demonstrate improved coordination by decreasing time on 9 hole peg test by 5 seconds (baseline 35. 25) to aid in functional tasks   Status New   OT LONG TERM GOAL #5   Title Pt will demonstrate ability to use R hand as dominant hand for basic ADL tasks   Status New               Plan - 07/20/14 1729    Clinical Impression Statement Pt progressing toward LTG's. Pt with less  edema, less stiffness and increased use of RUE   Rehab Potential Good   Clinical Impairments Affecting Rehab Potential Pt will benefit from skilled OT to address the above deficits   OT Frequency 2x / week   OT Duration 8 weeks   OT Treatment/Interventions Self-care/ADL training;Ultrasound;Moist Heat;Electrical Stimulation;Fluidtherapy;Contrast Bath;DME and/or AE instruction;Therapeutic exercise;Manual Therapy;Passive range of motion;Therapeutic activities;Patient/family education   Plan check FM HEP, strengthening of RUE        Problem List Patient Active Problem List   Diagnosis Date Noted  . Olecranon bursitis of right elbow 06/08/2014  . Healthcare maintenance 05/31/2014  . Swelling of right hand 05/24/2014  . CKD (chronic kidney disease) stage 3, GFR 30-59 ml/min 01/30/2012  . Obesity 10/22/2011  . Anemia 12/26/2010  . DEGENERATIVE JOINT DISEASE, ADVANCED 03/16/2009  . DIABETIC  RETINOPATHY 02/06/2007  . Hyperlipidemia LDL goal <100 06/08/2006  . PAROXYSMAL ATRIAL TACHYCARDIA 06/08/2006  . GERD 06/08/2006  . Type II diabetes mellitus with complication, uncontrolled 04/22/2006  . Essential hypertension 04/22/2006  . CARDIOMYOPATHY 04/22/2006    Quay Burow, OTR/L 07/20/2014, 5:30 PM  [facility] [address] [place], [place], [address] Phone: [phone]   Fax:  [phone]

## 2014-07-22 ENCOUNTER — Ambulatory Visit: Payer: 59 | Admitting: Occupational Therapy

## 2014-07-22 DIAGNOSIS — R609 Edema, unspecified: Secondary | ICD-10-CM

## 2014-07-22 DIAGNOSIS — R279 Unspecified lack of coordination: Secondary | ICD-10-CM | POA: Diagnosis not present

## 2014-07-22 DIAGNOSIS — R531 Weakness: Secondary | ICD-10-CM

## 2014-07-22 NOTE — Patient Instructions (Signed)
Flexor Tendon Gliding (Active Hook Fist)   With fingers and knuckles straight, bend middle and tip joints. Do not bend large knuckles. Repeat _10-15___ times. Do _4-6___ sessions per day.  MP Flexion (Active)   With back of hand on table, bend large knuckles as far as they will go, keeping small joints straight. Repeat _10-15___ times. Do __4-6__ sessions per day. Activity: Reach into a narrow container.*      Finger Flexion / Extension   With palm up, bend fingers of left hand toward palm, making a  Fist. Then straighten big knuckles to "hook" position, then straighten fingers.  Repeat sequence _10-15___ times per session. Do _4-6__ sessions per day.   Finger Abduction / Adduction   Spread fingers of left hand. Then bring together. Keep fingers straight. Repeat sequence _10___ times per session. Do __3-4__ sessions per day. Hand Variation: Palm up      PIP Flexion (Passive)   Use other hand to bend the middle joint of __each____ finger down as far as possible. Hold _10___ seconds. Repeat __5__ times. Do _4-6___ sessions per day.   ROLL FOAM DOWN FINGERTIPS TO PALM AND BACK UP TO FINGERTIPS

## 2014-07-22 NOTE — Therapy (Signed)
[facility] [address] [place], [place], [address] Phone: [phone]   Fax:  [phone]  Occupational Therapy Treatment  Patient Details  Name: [name] MRN: [id number] Date of Birth: 26-Nov-1955 Referring Provider:  Corky Sox, MD  Encounter Date: 07/22/2014      OT End of Session - 07/22/14 1620    Visit Number 4   Number of Visits 16   Date for OT Re-Evaluation 08/30/14   Authorization Type UHC   OT Start Time [id number]   OT Stop Time 1615   OT Time Calculation (min) 45 min   Activity Tolerance Patient tolerated treatment well      Past Medical History  Diagnosis Date  . Diabetes mellitus   . Hypertension   . GERD (gastroesophageal reflux disease)   . Hyperlipemia   . Paroxysmal atrial tachycardia   . Cardiomyopathy     Resolved with EF 55% 2011  . CARDIOMYOPATHY 04/22/2006  . DIABETIC  RETINOPATHY 02/06/2007  . NEPHROPATHY, DIABETIC 06/08/2006    Past Surgical History  Procedure Laterality Date  . Cardiac catheterization  Dec 2007    normal; EF- 20-25%    There were no vitals taken for this visit.  Visit Diagnosis:  Lack of coordination  Generalized weakness  Edema      Subjective Assessment - 07/22/14 1534    Symptoms My hand has gotten better   Pertinent History see epic snapshot, R hand cellulitis   Currently in Pain? No/denies                 OT Treatments/Exercises (OP) - 07/22/14 1540    Hand Exercises   Other Hand Exercises Gripper set at 35 lbs resistance to pick up blocks for sustained grip strength.    Other Hand Exercises A/ROM in finger abduction/adduction (decreased motion compared to Lt), isolated MP flexion, isolated IP flexion (w/ noted stiffness in PIP joints), then composite flexion. Pt also shown P/ROM stretch to PIP joints. Pt issued tan foam and encourage to perform finger rolling ex from fingertips to palm and back up to encourage IP flexion.    Manual  Therapy   Manual Therapy Passive ROM   Passive ROM All joints Rt hand compositely and isolated.                 OT Education - 07/22/14 1612    Education provided Yes   Education Details A/ROM HEP isolated different joints and P/ROM to PIP joints   Person(s) Educated Patient   Methods Explanation;Demonstration;Handout   Comprehension Verbalized understanding;Returned demonstration          OT Short Term Goals - 07/22/14 1621    OT SHORT TERM GOAL #1   Title Pt will be I with HEP - 08/02/2014   Status Achieved   OT SHORT TERM GOAL #2   Title Pt will demonstrate decreased edema as evidenced by visual inspection, pt reported decreased tightness and improved composite flexion (to at least 80%)   Status Achieved   OT SHORT TERM GOAL #3   Title Pt will demonstrate ability to pick up moderate sized cylindrical objects with no more than 10% drop rate   Status New   OT SHORT TERM GOAL #4   Title Pt will improve strength in hand by 4 pounds to assist in functional tasks.(baseline 19 pounds)   Baseline 19 lbs   Status Achieved  26 lbs on 07/22/14  OT Long Term Goals - 07/20/14 1730    OT LONG TERM GOAL #1   Title Pt will be I with upgraded HEP prn -08/30/2014   Status New   OT LONG TERM GOAL #2   Title Pt will demonstrate at least 90% composite flexion of R hand to assist in functional tasks   Status New   OT LONG TERM GOAL #3   Title Pt will demonstrate improved strength in R hand by at least 8 pounds (baseline 19 pounds)   Status New   OT LONG TERM GOAL #4   Title Pt will demonstrate improved coordination by decreasing time on 9 hole peg test by 5 seconds (baseline 35. 25) to aid in functional tasks   Status New   OT LONG TERM GOAL #5   Title Pt will demonstrate ability to use R hand as dominant hand for basic ADL tasks   Status New               Plan - 07/22/14 1622    Clinical Impression Statement Pt met 3 STG's today. Pt w/ overall decreased  edema and increased sustained grip strength. Pt with noted PIP stiffness in all fingers (long finger slightly worse)   Plan continue ROM, strength, coordination Rt dominant hand. Assess STG #3        Problem List Patient Active Problem List   Diagnosis Date Noted  . Olecranon bursitis of right elbow 06/08/2014  . Healthcare maintenance 05/31/2014  . Swelling of right hand 05/24/2014  . CKD (chronic kidney disease) stage 3, GFR 30-59 ml/min 01/30/2012  . Obesity 10/22/2011  . Anemia 12/26/2010  . DEGENERATIVE JOINT DISEASE, ADVANCED 03/16/2009  . DIABETIC  RETINOPATHY 02/06/2007  . Hyperlipidemia LDL goal <100 06/08/2006  . PAROXYSMAL ATRIAL TACHYCARDIA 06/08/2006  . GERD 06/08/2006  . Type II diabetes mellitus with complication, uncontrolled 04/22/2006  . Essential hypertension 04/22/2006  . CARDIOMYOPATHY 04/22/2006    Carey Bullocks, OTR/L 07/22/2014, 4:25 PM  [facility] [address] [place], [place], [address] Phone: [phone]   Fax:  [phone]

## 2014-07-27 ENCOUNTER — Ambulatory Visit: Payer: 59 | Admitting: Occupational Therapy

## 2014-07-27 ENCOUNTER — Encounter: Payer: Self-pay | Admitting: Occupational Therapy

## 2014-07-27 DIAGNOSIS — R609 Edema, unspecified: Secondary | ICD-10-CM

## 2014-07-27 DIAGNOSIS — R279 Unspecified lack of coordination: Secondary | ICD-10-CM | POA: Diagnosis not present

## 2014-07-27 DIAGNOSIS — R531 Weakness: Secondary | ICD-10-CM

## 2014-07-27 NOTE — Patient Instructions (Signed)
Edema massage: Do 1-2 times per day 1. Use cream to reduce friction. 2. Always apply pressure from fingertips toward heart. 3.  Using firm pressure do each finger then back of hand 4. Turn hand over and do each finger then palm of hand.  Pt was issued compression (Isotoner) glove to assist with reducing swelling.  Pt should wear glove at NIGHT only. Remove during the day.  Prop right hand on a pillow when sleeping or sitting.  Do not sit with your right hand in a dependent position.  Make sure you are doing your hand exercises every single day several times a day (at least 4-6 times per day).

## 2014-07-27 NOTE — Therapy (Signed)
[facility] [address] [place], [place], [address] Phone: [phone]   Fax:  [phone]  Occupational Therapy Treatment  Patient Details  Name: [name] MRN: [id number] Date of Birth: Apr 10, 1956 Referring Provider:  Corky Sox, MD  Encounter Date: 07/27/2014      OT End of Session - 07/27/14 1616    Visit Number 5   Number of Visits 16   Date for OT Re-Evaluation 08/30/14   Authorization Type UHC   OT Start Time [id number]   OT Stop Time 1614   OT Time Calculation (min) 44 min   Activity Tolerance Patient tolerated treatment well      Past Medical History  Diagnosis Date  . Diabetes mellitus   . Hypertension   . GERD (gastroesophageal reflux disease)   . Hyperlipemia   . Paroxysmal atrial tachycardia   . Cardiomyopathy     Resolved with EF 55% 2011  . CARDIOMYOPATHY 04/22/2006  . DIABETIC  RETINOPATHY 02/06/2007  . NEPHROPATHY, DIABETIC 06/08/2006    Past Surgical History  Procedure Laterality Date  . Cardiac catheterization  Dec 2007    normal; EF- 20-25%    There were no vitals taken for this visit.  Visit Diagnosis:  Generalized weakness  Edema      Subjective Assessment - 07/27/14 1536    Symptoms I still have some tingling   Pertinent History see epic snapshot, R hand cellulitis   Currently in Pain? No/denies                 OT Treatments/Exercises (OP) - 07/27/14 0001    Exercises   Exercises Hand   Hand Exercises   Other Hand Exercises Completed all exercises from HEP - pt needed mod vc's to complete  Included AAROM for hook, roof tendon glides, PIP flexion of each digit, composite flexion/extension of entire hand, ab and adduction of fingers.  Pt able to complete 10-15 reps of each exercises.   Modalities   Modalities Cryotherapy   Cryotherapy   Number Minutes Cryotherapy 8 Minutes   Cryotherapy Location Hand   Type of Cryotherapy Ice pack  pt tolerated well   Manual Therapy   Manual Therapy Edema management   Edema Management Pt instructed in edema massage and asked to complete 1-2 times per day.  Pt able to return demonstrate. Pt also issued compression glove to wear at night only. Discussed positioning of R hand while sleeping or with prolonged sitting. Pt verbalized understanding.                OT Education - 07/27/14 1616    Education provided Yes   Education Details Edema mgmt, isotoner glove, positioning of R hand   Person(s) Educated Patient   Methods Explanation;Demonstration;Verbal cues;Handout   Comprehension Verbalized understanding;Returned demonstration          OT Short Term Goals - 07/27/14 1617    OT SHORT TERM GOAL #1   Title Pt will be I with HEP - 08/02/2014   Status Achieved   OT SHORT TERM GOAL #2   Title Pt will demonstrate decreased edema as evidenced by visual inspection, pt reported decreased tightness and improved composite flexion (to at least 80%)   Status Achieved   OT SHORT TERM GOAL #3   Title Pt will demonstrate ability to pick up moderate sized cylindrical objects with no more than 10% drop rate   Status Achieved   OT SHORT TERM GOAL #4   Title  Pt will improve strength in hand by 4 pounds to assist in functional tasks.(baseline 19 pounds)   Baseline 19 lbs   Status Achieved  26 lbs on 07/22/14           OT Long Term Goals - 07/27/14 1618    OT LONG TERM GOAL #1   Title Pt will be I with upgraded HEP prn -08/30/2014   Status New   OT LONG TERM GOAL #2   Title Pt will demonstrate at least 90% composite flexion of R hand to assist in functional tasks   Status New   OT LONG TERM GOAL #3   Title Pt will demonstrate improved strength in R hand by at least 8 pounds (baseline 19 pounds)   Status New   OT LONG TERM GOAL #4   Title Pt will demonstrate improved coordination by decreasing time on 9 hole peg test by 5 seconds (baseline 35. 25) to aid in functional tasks   Status New   OT LONG  TERM GOAL #5   Title Pt will demonstrate ability to use R hand as dominant hand for basic ADL tasks   Status New               Plan - 07/27/14 1616    Clinical Impression Statement Pt making slow gains toward goals. Pt needed mod vc's for HEP for ROM.   Rehab Potential Good   Clinical Impairments Affecting Rehab Potential Pt will benefit from skilled OT to address the above deficits   OT Frequency 2x / week   OT Duration 8 weeks   OT Treatment/Interventions Self-care/ADL training;Ultrasound;Moist Heat;Electrical Stimulation;Fluidtherapy;Contrast Bath;DME and/or AE instruction;Therapeutic exercise;Manual Therapy;Passive range of motion;Therapeutic activities;Patient/family education   Plan continue ROM, strength, coordination RT hand.   Consulted and Agree with Plan of Care Patient        Problem List Patient Active Problem List   Diagnosis Date Noted  . Olecranon bursitis of right elbow 06/08/2014  . Healthcare maintenance 05/31/2014  . Swelling of right hand 05/24/2014  . CKD (chronic kidney disease) stage 3, GFR 30-59 ml/min 01/30/2012  . Obesity 10/22/2011  . Anemia 12/26/2010  . DEGENERATIVE JOINT DISEASE, ADVANCED 03/16/2009  . DIABETIC  RETINOPATHY 02/06/2007  . Hyperlipidemia LDL goal <100 06/08/2006  . PAROXYSMAL ATRIAL TACHYCARDIA 06/08/2006  . GERD 06/08/2006  . Type II diabetes mellitus with complication, uncontrolled 04/22/2006  . Essential hypertension 04/22/2006  . CARDIOMYOPATHY 04/22/2006    Quay Burow, OTR/L 07/27/2014, 4:19 PM  [facility] [address] [place], [place], [address] Phone: [phone]   Fax:  [phone]

## 2014-07-29 ENCOUNTER — Ambulatory Visit: Payer: 59 | Admitting: Occupational Therapy

## 2014-07-29 DIAGNOSIS — R279 Unspecified lack of coordination: Secondary | ICD-10-CM

## 2014-07-29 DIAGNOSIS — R531 Weakness: Secondary | ICD-10-CM

## 2014-07-29 NOTE — Therapy (Signed)
[facility] [address] [place], [place], [address] Phone: [phone]   Fax:  [phone]  Occupational Therapy Treatment  Patient Details  Name: [name] MRN: [id number] Date of Birth: 01/02/1956 Referring Provider:  Corky Sox, MD  Encounter Date: 07/29/2014      OT End of Session - 07/29/14 1633    Visit Number 6   Number of Visits 16   Date for OT Re-Evaluation 08/30/14   Authorization Type [employer]   OT Start Time 1450   OT Stop Time 1530   OT Time Calculation (min) 40 min   Activity Tolerance Patient tolerated treatment well      Past Medical History  Diagnosis Date  . Diabetes mellitus   . Hypertension   . GERD (gastroesophageal reflux disease)   . Hyperlipemia   . Paroxysmal atrial tachycardia   . Cardiomyopathy     Resolved with EF 55% 2011  . CARDIOMYOPATHY 04/22/2006  . DIABETIC  RETINOPATHY 02/06/2007  . NEPHROPATHY, DIABETIC 06/08/2006    Past Surgical History  Procedure Laterality Date  . Cardiac catheterization  Dec 2007    normal; EF- 20-25%    There were no vitals taken for this visit.  Visit Diagnosis:  Generalized weakness  Lack of coordination      Subjective Assessment - 07/29/14 1457    Symptoms I've been working on my ex's   Pertinent History see epic snapshot, R hand cellulitis   Currently in Pain? No/denies                 OT Treatments/Exercises (OP) - 07/29/14 1506    Hand Exercises   Other Hand Exercises Gripper set @ 35 lbs resistance to pick up blocks Rt hand w/ min difficulty and drops.    Fine Motor Coordination   Fine Motor Coordination Large Pegboard   Large Pegboard Pt placing large pegs in pegboard manipulating 3 at a time for in hand manipulation and facilitating increased ROM of fingers   Other Fine Motor Exercises Pushing cards out Rt digits to encourage/facilitate PIP flexion and extension. Pen rolling ex (with highlighter) to facilitate  increased IP flexion.    Manual Therapy   Passive ROM passive flexion and extension to PIP joints all digits Rt hand compositely and isolated.                   OT Short Term Goals - 07/27/14 1617    OT SHORT TERM GOAL #1   Title Pt will be I with HEP - 08/02/2014   Status Achieved   OT SHORT TERM GOAL #2   Title Pt will demonstrate decreased edema as evidenced by visual inspection, pt reported decreased tightness and improved composite flexion (to at least 80%)   Status Achieved   OT SHORT TERM GOAL #3   Title Pt will demonstrate ability to pick up moderate sized cylindrical objects with no more than 10% drop rate   Status Achieved   OT SHORT TERM GOAL #4   Title Pt will improve strength in hand by 4 pounds to assist in functional tasks.(baseline 19 pounds)   Baseline 19 lbs   Status Achieved  26 lbs on 07/22/14           OT Long Term Goals - 07/27/14 1618    OT LONG TERM GOAL #1   Title Pt will be I with upgraded HEP prn -08/30/2014   Status New   OT LONG TERM GOAL #2  Title Pt will demonstrate at least 90% composite flexion of R hand to assist in functional tasks   Status New   OT LONG TERM GOAL #3   Title Pt will demonstrate improved strength in R hand by at least 8 pounds (baseline 19 pounds)   Status New   OT LONG TERM GOAL #4   Title Pt will demonstrate improved coordination by decreasing time on 9 hole peg test by 5 seconds (baseline 35. 25) to aid in functional tasks   Status New   OT LONG TERM GOAL #5   Title Pt will demonstrate ability to use R hand as dominant hand for basic ADL tasks   Status New               Plan - 07/29/14 1634    Clinical Impression Statement Pt making small gains in ROM and Rt hand use   Plan continue ROM, strength and coordination Rt hand        Problem List Patient Active Problem List   Diagnosis Date Noted  . Olecranon bursitis of right elbow 06/08/2014  . Healthcare maintenance 05/31/2014  . Swelling of  right hand 05/24/2014  . CKD (chronic kidney disease) stage 3, GFR 30-59 ml/min 01/30/2012  . Obesity 10/22/2011  . Anemia 12/26/2010  . DEGENERATIVE JOINT DISEASE, ADVANCED 03/16/2009  . DIABETIC  RETINOPATHY 02/06/2007  . Hyperlipidemia LDL goal <100 06/08/2006  . PAROXYSMAL ATRIAL TACHYCARDIA 06/08/2006  . GERD 06/08/2006  . Type II diabetes mellitus with complication, uncontrolled 04/22/2006  . Essential hypertension 04/22/2006  . CARDIOMYOPATHY 04/22/2006    Carey Bullocks, OTR/L 07/29/2014, 4:35 PM  [facility] [address] [place], [place], [address] Phone: [phone]   Fax:  [phone]

## 2014-08-03 ENCOUNTER — Encounter: Payer: Self-pay | Admitting: Occupational Therapy

## 2014-08-03 ENCOUNTER — Ambulatory Visit: Payer: 59 | Attending: Internal Medicine | Admitting: Occupational Therapy

## 2014-08-03 DIAGNOSIS — R279 Unspecified lack of coordination: Secondary | ICD-10-CM | POA: Insufficient documentation

## 2014-08-03 DIAGNOSIS — I129 Hypertensive chronic kidney disease with stage 1 through stage 4 chronic kidney disease, or unspecified chronic kidney disease: Secondary | ICD-10-CM | POA: Diagnosis not present

## 2014-08-03 DIAGNOSIS — N183 Chronic kidney disease, stage 3 (moderate): Secondary | ICD-10-CM | POA: Insufficient documentation

## 2014-08-03 DIAGNOSIS — E785 Hyperlipidemia, unspecified: Secondary | ICD-10-CM | POA: Diagnosis not present

## 2014-08-03 DIAGNOSIS — E11319 Type 2 diabetes mellitus with unspecified diabetic retinopathy without macular edema: Secondary | ICD-10-CM | POA: Diagnosis not present

## 2014-08-03 DIAGNOSIS — R609 Edema, unspecified: Secondary | ICD-10-CM | POA: Diagnosis not present

## 2014-08-03 DIAGNOSIS — E1121 Type 2 diabetes mellitus with diabetic nephropathy: Secondary | ICD-10-CM | POA: Insufficient documentation

## 2014-08-03 DIAGNOSIS — R531 Weakness: Secondary | ICD-10-CM

## 2014-08-03 DIAGNOSIS — K219 Gastro-esophageal reflux disease without esophagitis: Secondary | ICD-10-CM | POA: Insufficient documentation

## 2014-08-03 NOTE — Therapy (Signed)
[facility] [address] [place], [place], [address] Phone: [phone]   Fax:  [phone]  Occupational Therapy Treatment  Patient Details  Name: [name] MRN: [id number] Date of Birth: 08-05-1955 Referring Provider:  Corky Sox, MD  Encounter Date: 08/03/2014      OT End of Session - 08/03/14 1713    Visit Number 7   Number of Visits 16   Date for OT Re-Evaluation 08/30/14   Authorization Type [employer]   OT Start Time 5638   OT Stop Time 1615   OT Time Calculation (min) 44 min   Activity Tolerance Patient tolerated treatment well      Past Medical History  Diagnosis Date  . Diabetes mellitus   . Hypertension   . GERD (gastroesophageal reflux disease)   . Hyperlipemia   . Paroxysmal atrial tachycardia   . Cardiomyopathy     Resolved with EF 55% 2011  . CARDIOMYOPATHY 04/22/2006  . DIABETIC  RETINOPATHY 02/06/2007  . NEPHROPATHY, DIABETIC 06/08/2006    Past Surgical History  Procedure Laterality Date  . Cardiac catheterization  Dec 2007    normal; EF- 20-25%    There were no vitals taken for this visit.  Visit Diagnosis:  Generalized weakness  Lack of coordination      Subjective Assessment - 08/03/14 1537    Symptoms I still have some tingling but it is better - comes and goes now   Pertinent History see epic snapshot, R hand cellulitis   Currently in Pain? No/denies                 OT Treatments/Exercises (OP) - 08/03/14 0001    Exercises   Exercises Hand   Hand Exercises   Hand Gripper with Small Beads 35 pounds of resistance with one inch blocks (stacking and unstacking  to address sustained grip strength and sustained muscle activitation   Manual Therapy   Manual Therapy Joint mobilization;Other (comment)  AAROM to address PROM   Joint Mobilization Joint mob to MCP , DIP and PIP of index and middle finger followed by  [facility] for composite flexion, hook and roof exercised.  Pt  continues to need min vc's for proper completion of hook  and roof.                    OT Short Term Goals - 08/03/14 1715    OT SHORT TERM GOAL #1   Title Pt will be I with HEP - 08/02/2014   Status Achieved   OT SHORT TERM GOAL #2   Title Pt will demonstrate decreased edema as evidenced by visual inspection, pt reported decreased tightness and improved composite flexion (to at least 80%)   Status Achieved   OT SHORT TERM GOAL #3   Title Pt will demonstrate ability to pick up moderate sized cylindrical objects with no more than 10% drop rate   Status Achieved   OT SHORT TERM GOAL #4   Title Pt will improve strength in hand by 4 pounds to assist in functional tasks.(baseline 19 pounds)   Baseline 19 lbs   Status Achieved  26 lbs on 07/22/14           OT Long Term Goals - 08/03/14 1544    OT LONG TERM GOAL #1   Title Pt will be I with upgraded HEP prn -08/30/2014   Status On-going   OT LONG TERM GOAL #2   Title Pt will demonstrate at  least 90% composite flexion of R hand to assist in functional tasks   Status On-going   OT LONG TERM GOAL #3   Title Pt will demonstrate improved strength in R hand by at least 8 pounds (baseline 19 pounds)   Status On-going   OT LONG TERM GOAL #4   Title Pt will demonstrate improved coordination by decreasing time on 9 hole peg test by 8 seconds (baseline 35. 25) to aid in functional tasks  met on 08/03/2014 . Goal Revised 08/03/2014   Status New   OT LONG TERM GOAL #5   Title Pt will demonstrate ability to use R hand as dominant hand for basic ADL tasks   Status New               Plan - 08/03/14 1714    Clinical Impression Statement Pt continues to make slow steady gains toward goals.  Pt with less parastesias and using R hand as dominant hand for all ADL tasks except shaving.   Rehab Potential Good   Clinical Impairments Affecting Rehab Potential Pt will benefit from skilled OT to address the above deficits   OT Frequency 2x /  week   OT Duration 8 weeks   OT Treatment/Interventions Self-care/ADL training;Ultrasound;Moist Heat;Electrical Stimulation;Fluidtherapy;Contrast Bath;DME and/or AE instruction;Therapeutic exercise;Manual Therapy;Passive range of motion;Therapeutic activities;Patient/family education   Plan continue ROM, strength functional use.   Consulted and Agree with Plan of Care Patient        Problem List Patient Active Problem List   Diagnosis Date Noted  . Olecranon bursitis of right elbow 06/08/2014  . Healthcare maintenance 05/31/2014  . Swelling of right hand 05/24/2014  . CKD (chronic kidney disease) stage 3, GFR 30-59 ml/min 01/30/2012  . Obesity 10/22/2011  . Anemia 12/26/2010  . DEGENERATIVE JOINT DISEASE, ADVANCED 03/16/2009  . DIABETIC  RETINOPATHY 02/06/2007  . Hyperlipidemia LDL goal <100 06/08/2006  . PAROXYSMAL ATRIAL TACHYCARDIA 06/08/2006  . GERD 06/08/2006  . Type II diabetes mellitus with complication, uncontrolled 04/22/2006  . Essential hypertension 04/22/2006  . CARDIOMYOPATHY 04/22/2006    Quay Burow, OTR/L 08/03/2014, 5:16 PM  [facility] [address] [place], [place], [address] Phone: [phone]   Fax:  [phone]

## 2014-08-05 ENCOUNTER — Ambulatory Visit: Payer: 59 | Admitting: Occupational Therapy

## 2014-08-05 ENCOUNTER — Encounter: Payer: Self-pay | Admitting: Occupational Therapy

## 2014-08-05 DIAGNOSIS — R279 Unspecified lack of coordination: Secondary | ICD-10-CM | POA: Diagnosis not present

## 2014-08-05 DIAGNOSIS — R531 Weakness: Secondary | ICD-10-CM

## 2014-08-05 NOTE — Therapy (Signed)
[facility] [address] [place], [place], [address] Phone: [phone]   Fax:  [phone]  Occupational Therapy Treatment  Patient Details  Name: [name] MRN: [id number] Date of Birth: 10-Jan-1956 Referring Provider:  Corky Sox, MD  Encounter Date: 08/05/2014      OT End of Session - 08/05/14 1617    Visit Number 8   Number of Visits 16   Date for OT Re-Evaluation 08/30/14   Authorization Type [employer]   OT Start Time 9381   OT Stop Time 1615   OT Time Calculation (min) 44 min   Activity Tolerance Patient tolerated treatment well      Past Medical History  Diagnosis Date  . Diabetes mellitus   . Hypertension   . GERD (gastroesophageal reflux disease)   . Hyperlipemia   . Paroxysmal atrial tachycardia   . Cardiomyopathy     Resolved with EF 55% 2011  . CARDIOMYOPATHY 04/22/2006  . DIABETIC  RETINOPATHY 02/06/2007  . NEPHROPATHY, DIABETIC 06/08/2006    Past Surgical History  Procedure Laterality Date  . Cardiac catheterization  Dec 2007    normal; EF- 20-25%    There were no vitals taken for this visit.  Visit Diagnosis:  Generalized weakness      Subjective Assessment - 08/05/14 1540    Symptoms I feel like I can do somethings better wtih my right hand   Pertinent History see epic snapshot, R hand cellulitis   Currently in Pain? No/denies                 OT Treatments/Exercises (OP) - 08/05/14 0001    ADLs   ADL Comments Pt practiced opening various containers including child safety prescription bottles (at home pt was unable to open these on eval and has family members leave bottle caps loose).  Today pt able to open all containers.  Pt issued red foam after trial of using foam for simulated eating and cutting activities. Pt to use on toothbrush and utentsils at home to allow him to use his right hand as dominant for all ADL activities - pt in agreement.  Pt also issued brown foam  after trial of various writing equipment. Pt able to write at 2 paragraph level with brown foam with 100% legibility and one rest break.  Pt reports   7/10 for fatigue after writing activity.   Manual Therapy   Passive ROM AAROM for composite flexion - after AAROM pt able to demonsrated 90 % composite flexion. Hand remains stiff with parasthesias however pt with significantly improved functional use of R dominant hand.                  OT Short Term Goals - 08/05/14 1734    OT SHORT TERM GOAL #1   Title Pt will be I with HEP - 08/02/2014   Status Achieved   OT SHORT TERM GOAL #2   Title Pt will demonstrate decreased edema as evidenced by visual inspection, pt reported decreased tightness and improved composite flexion (to at least 80%)   Status Achieved   OT SHORT TERM GOAL #3   Title Pt will demonstrate ability to pick up moderate sized cylindrical objects with no more than 10% drop rate   Status Achieved   OT SHORT TERM GOAL #4   Title Pt will improve strength in hand by 4 pounds to assist in functional tasks.(baseline 19 pounds)   Baseline 19 lbs   Status Achieved  26 lbs on 07/22/14           OT Long Term Goals - 08/05/14 1734    OT LONG TERM GOAL #1   Title Pt will be I with upgraded HEP prn -08/30/2014   Status On-going   OT LONG TERM GOAL #2   Title Pt will demonstrate at least 90% composite flexion of R hand to assist in functional tasks   Status Achieved   OT LONG TERM GOAL #3   Title Pt will demonstrate improved strength in R hand by at least 8 pounds (baseline 19 pounds)   Status On-going   OT LONG TERM GOAL #4   Title Pt will demonstrate improved coordination by decreasing time on 9 hole peg test by 8 seconds (baseline 35. 25) to aid in functional tasks  met on 08/03/2014 . Goal Revised 08/03/2014   Status On-going   OT LONG TERM GOAL #5   Title Pt will demonstrate ability to use R hand as dominant hand for basic ADL tasks   Status Achieved                Plan - 08/05/14 1617    Clinical Impression Statement Pt making siginificant functional progress and progress toward goals.   Rehab Potential Good   Clinical Impairments Affecting Rehab Potential Pt will benefit from skilled OT to address the above deficits   OT Frequency 2x / week   OT Duration 8 weeks   OT Treatment/Interventions Self-care/ADL training;Ultrasound;Moist Heat;Electrical Stimulation;Fluidtherapy;Contrast Bath;DME and/or AE instruction;Therapeutic exercise;Manual Therapy;Passive range of motion;Therapeutic activities;Patient/family education   Plan ROM, strength, funcitonal use   Consulted and Agree with Plan of Care Patient        Problem List Patient Active Problem List   Diagnosis Date Noted  . Olecranon bursitis of right elbow 06/08/2014  . Healthcare maintenance 05/31/2014  . Swelling of right hand 05/24/2014  . CKD (chronic kidney disease) stage 3, GFR 30-59 ml/min 01/30/2012  . Obesity 10/22/2011  . Anemia 12/26/2010  . DEGENERATIVE JOINT DISEASE, ADVANCED 03/16/2009  . DIABETIC  RETINOPATHY 02/06/2007  . Hyperlipidemia LDL goal <100 06/08/2006  . PAROXYSMAL ATRIAL TACHYCARDIA 06/08/2006  . GERD 06/08/2006  . Type II diabetes mellitus with complication, uncontrolled 04/22/2006  . Essential hypertension 04/22/2006  . CARDIOMYOPATHY 04/22/2006    Quay Burow, OTR/L 08/05/2014, 5:37 PM  [facility] [address] [place], [place], [address] Phone: [phone]   Fax:  [phone]

## 2014-08-10 ENCOUNTER — Ambulatory Visit: Payer: 59 | Admitting: Occupational Therapy

## 2014-08-10 ENCOUNTER — Encounter: Payer: Self-pay | Admitting: Occupational Therapy

## 2014-08-10 DIAGNOSIS — R531 Weakness: Secondary | ICD-10-CM

## 2014-08-10 DIAGNOSIS — R279 Unspecified lack of coordination: Secondary | ICD-10-CM | POA: Diagnosis not present

## 2014-08-10 NOTE — Therapy (Signed)
[facility] [address] [place], [place], [address] Phone: [phone]   Fax:  [phone]  Occupational Therapy Treatment  Patient Details  Name: [name] MRN: [id number] Date of Birth: 1956/02/13 Referring Provider:  Corky Sox, MD  Encounter Date: 08/10/2014      OT End of Session - 08/10/14 1725    Visit Number 9   Number of Visits 16   Date for OT Re-Evaluation 08/30/14   OT Start Time 1531   OT Stop Time 1614   OT Time Calculation (min) 43 min   Activity Tolerance Patient tolerated treatment well      Past Medical History  Diagnosis Date  . Diabetes mellitus   . Hypertension   . GERD (gastroesophageal reflux disease)   . Hyperlipemia   . Paroxysmal atrial tachycardia   . Cardiomyopathy     Resolved with EF 55% 2011  . CARDIOMYOPATHY 04/22/2006  . DIABETIC  RETINOPATHY 02/06/2007  . NEPHROPATHY, DIABETIC 06/08/2006    Past Surgical History  Procedure Laterality Date  . Cardiac catheterization  Dec 2007    normal; EF- 20-25%    There were no vitals taken for this visit.  Visit Diagnosis:  Generalized weakness      Subjective Assessment - 08/10/14 1539    Symptoms The hand is a little stiff today   Pertinent History see epic snapshot, R hand cellulitis   Currently in Pain? No/denies                 OT Treatments/Exercises (OP) - 08/10/14 0001    Exercises   Exercises Hand   Hand Exercises   Theraputty Locate Pegs  20 pegs in green putty   Other Hand Exercises Resistive therapeutic activities to increase hand strength, encouage sustained hand activity, and increase activity tolerance with R hand. Pt able to tolerate  10 minutes of resistive activity with fatigue score of  5/10.  Pt participated in therapy session with three rest breaks                  OT Short Term Goals - 08/10/14 1728    OT SHORT TERM GOAL #1   Title Pt will be I with HEP - 08/02/2014   Status Achieved   OT SHORT TERM GOAL #2   Title Pt will demonstrate decreased edema as evidenced by visual inspection, pt reported decreased tightness and improved composite flexion (to at least 80%)   Status Achieved   OT SHORT TERM GOAL #3   Title Pt will demonstrate ability to pick up moderate sized cylindrical objects with no more than 10% drop rate   Status Achieved   OT SHORT TERM GOAL #4   Title Pt will improve strength in hand by 4 pounds to assist in functional tasks.(baseline 19 pounds)   Baseline 19 lbs   Status Achieved  26 lbs on 07/22/14           OT Long Term Goals - 08/10/14 1728    OT LONG TERM GOAL #1   Title Pt will be I with upgraded HEP prn -08/30/2014   Status On-going   OT LONG TERM GOAL #2   Title Pt will demonstrate at least 90% composite flexion of R hand to assist in functional tasks   Status Achieved   OT LONG TERM GOAL #3   Title Pt will demonstrate improved strength in R hand by at least 8 pounds (baseline 19 pounds)   Status On-going  OT LONG TERM GOAL #4   Title Pt will demonstrate improved coordination by decreasing time on 9 hole peg test by 8 seconds (baseline 35. 25) to aid in functional tasks  met on 08/03/2014 . Goal Revised 08/03/2014   Status On-going   OT LONG TERM GOAL #5   Title Pt will demonstrate ability to use R hand as dominant hand for basic ADL tasks   Status Achieved               Plan - 08/10/14 1726    Clinical Impression Statement Pt making good progress toward goals with good improvement in strength and functional use.   Rehab Potential Good   Clinical Impairments Affecting Rehab Potential Pt will benefit from skilled OT to address the above deficits   OT Frequency 2x / week   OT Duration 8 weeks   OT Treatment/Interventions Self-care/ADL training;Ultrasound;Moist Heat;Electrical Stimulation;Fluidtherapy;Contrast Bath;DME and/or AE instruction;Therapeutic exercise;Manual Therapy;Passive range of motion;Therapeutic  activities;Patient/family education   Plan ROM - consider estim? functional use with resistive activities   Consulted and Agree with Plan of Care Patient        Problem List Patient Active Problem List   Diagnosis Date Noted  . Olecranon bursitis of right elbow 06/08/2014  . Healthcare maintenance 05/31/2014  . Swelling of right hand 05/24/2014  . CKD (chronic kidney disease) stage 3, GFR 30-59 ml/min 01/30/2012  . Obesity 10/22/2011  . Anemia 12/26/2010  . DEGENERATIVE JOINT DISEASE, ADVANCED 03/16/2009  . DIABETIC  RETINOPATHY 02/06/2007  . Hyperlipidemia LDL goal <100 06/08/2006  . PAROXYSMAL ATRIAL TACHYCARDIA 06/08/2006  . GERD 06/08/2006  . Type II diabetes mellitus with complication, uncontrolled 04/22/2006  . Essential hypertension 04/22/2006  . CARDIOMYOPATHY 04/22/2006    Quay Burow, OTR/L 08/10/2014, 5:29 PM  [facility] [address] [place], [place], [address] Phone: [phone]   Fax:  [phone]

## 2014-08-12 ENCOUNTER — Encounter: Payer: Self-pay | Admitting: Occupational Therapy

## 2014-08-12 ENCOUNTER — Ambulatory Visit: Payer: 59 | Admitting: Occupational Therapy

## 2014-08-12 DIAGNOSIS — R279 Unspecified lack of coordination: Secondary | ICD-10-CM

## 2014-08-12 DIAGNOSIS — R531 Weakness: Secondary | ICD-10-CM

## 2014-08-12 NOTE — Patient Instructions (Signed)
How to advance your home program on your own as your hand continues to get stronger!  Theraputty program: You are currently using the RED theraputty. You have also been issued the green putty (next step up).  As the red putty gets easier, you can slowly begin to transition to usingthe green putty: 1. Do the grip exercise first with the green and the rest of exercises with the red.  Slowly add more exercises with the green as the hand gets stronger. Try first to do half witht he red, half with the green then eventually the whole program with the green.   2. Can also do red one day and green the next as you are transitioning. 3.  Increase the repetitions of the exercise 4. Increase the number of times you do the whole exercise program in a day (i.e 1 to 2 times then to 3 times)  For coordination exercises you can increase the challenge by: 1. Increase the amount of time you spend on coordination activities. 2. Increase the number of sessions you do in a day 3. Increase the speed of the activity while maintaining the accuracy 4. Manipulate smaller objects.

## 2014-08-12 NOTE — Therapy (Signed)
[facility] [address] [place], [place], [address] Phone: [phone]   Fax:  [phone]  Occupational Therapy Treatment  Patient Details  Name: [name] MRN: [id number] Date of Birth: 1956/01/06 Referring Provider:  Corky Sox, MD  Encounter Date: 08/12/2014      OT End of Session - 08/12/14 1617    Visit Number 10   Number of Visits 16   Date for OT Re-Evaluation 08/30/14   Authorization Type [employer]   OT Start Time 4680   OT Stop Time 1612   OT Time Calculation (min) 41 min   Activity Tolerance Patient tolerated treatment well      Past Medical History  Diagnosis Date  . Diabetes mellitus   . Hypertension   . GERD (gastroesophageal reflux disease)   . Hyperlipemia   . Paroxysmal atrial tachycardia   . Cardiomyopathy     Resolved with EF 55% 2011  . CARDIOMYOPATHY 04/22/2006  . DIABETIC  RETINOPATHY 02/06/2007  . NEPHROPATHY, DIABETIC 06/08/2006    Past Surgical History  Procedure Laterality Date  . Cardiac catheterization  Dec 2007    normal; EF- 20-25%    There were no vitals filed for this visit.  Visit Diagnosis:  Generalized weakness  Lack of coordination      Subjective Assessment - 08/12/14 1536    Symptoms Tingling is getting a little better   Pertinent History see epic snapshot, R hand cellulitis   Currently in Pain? No/denies                    OT Treatments/Exercises (OP) - 08/12/14 0001    Exercises   Exercises Hand   Hand Exercises   Hand Gripper with Small Beads 55 pounds of resistance to stack and unstack 1 inch blocks to address strength, sustained activity, and coordination. Also reviewed and provided handout on how to upgrade HEP on his own - pt issued green theraputty to assist with upgrade. Pt able to verbalize understanding. See pt instructions for specifics.   Other Hand Exercises R grip strength 40 pounds today.  Pt independent with HEP for strength  and coordination. Pt with 8 second improvement in 9 hole peg test (R= 27.28).                  OT Education - 08/12/14 1616    Education provided Yes   Education Details how to upgrade HEP   Person(s) Educated Patient   Methods Explanation;Demonstration;Verbal cues;Handout   Comprehension Verbalized understanding;Returned demonstration          OT Short Term Goals - 08/12/14 1618    OT SHORT TERM GOAL #1   Title Pt will be I with HEP - 08/02/2014   Status Achieved   OT SHORT TERM GOAL #2   Title Pt will demonstrate decreased edema as evidenced by visual inspection, pt reported decreased tightness and improved composite flexion (to at least 80%)   Status Achieved   OT SHORT TERM GOAL #3   Title Pt will demonstrate ability to pick up moderate sized cylindrical objects with no more than 10% drop rate   Status Achieved   OT SHORT TERM GOAL #4   Title Pt will improve strength in hand by 4 pounds to assist in functional tasks.(baseline 19 pounds)   Baseline 19 lbs   Status Achieved  26 lbs on 07/22/14           OT Long Term Goals - 08/12/14  [place] #1   Title Pt will be I with upgraded HEP prn -08/30/2014   Status Achieved   OT LONG TERM GOAL #2   Title Pt will demonstrate at least 90% composite flexion of R hand to assist in functional tasks   Status Achieved   OT LONG TERM GOAL #3   Title Pt will demonstrate improved strength in R hand by at least 8 pounds (baseline 19 pounds)   Status Achieved   OT LONG TERM GOAL #4   Title Pt will demonstrate improved coordination by decreasing time on 9 hole peg test by 8 seconds (baseline 35. 25) to aid in functional tasks  met on 08/03/2014 . Goal Revised 08/03/2014   Status Achieved   OT LONG TERM GOAL #5   Title Pt will demonstrate ability to use R hand as dominant hand for basic ADL tasks   Status Achieved               Plan - 08/12/14 1617    Clinical Impression Statement Pt has met or exceeded  all LTG's and is now using RUE as dominant for both basic and advanced ADL's.    Rehab Potential Good   Clinical Impairments Affecting Rehab Potential Pt will benefit from skilled OT to address the above deficits   OT Frequency 2x / week   OT Duration 8 weeks   OT Treatment/Interventions Self-care/ADL training;Ultrasound;Moist Heat;Electrical Stimulation;Fluidtherapy;Contrast Bath;DME and/or AE instruction;Therapeutic exercise;Manual Therapy;Passive range of motion;Therapeutic activities;Patient/family education   Plan d/c from OT services   Consulted and Agree with Plan of Care Patient        Problem List Patient Active Problem List   Diagnosis Date Noted  . Olecranon bursitis of right elbow 06/08/2014  . Healthcare maintenance 05/31/2014  . Swelling of right hand 05/24/2014  . CKD (chronic kidney disease) stage 3, GFR 30-59 ml/min 01/30/2012  . Obesity 10/22/2011  . Anemia 12/26/2010  . DEGENERATIVE JOINT DISEASE, ADVANCED 03/16/2009  . DIABETIC  RETINOPATHY 02/06/2007  . Hyperlipidemia LDL goal <100 06/08/2006  . PAROXYSMAL ATRIAL TACHYCARDIA 06/08/2006  . GERD 06/08/2006  . Type II diabetes mellitus with complication, uncontrolled 04/22/2006  . Essential hypertension 04/22/2006  . CARDIOMYOPATHY 04/22/2006   OCCUPATIONAL THERAPY DISCHARGE SUMMARY  Visits from Start of Care: 10  Current functional level related to goals / functional outcomes: See LTG's   Remaining deficits: Decreased grip strength, mildly impaired coordination   Education / Equipment: HEP Plan: Patient agrees to discharge.  Patient goals were met. Patient is being discharged due to meeting the stated rehab goals.  ?????       Quay Burow, OTR/L 08/12/2014, 4:55 PM  [facility] [address] [place], [place], [address] Phone: [phone]   Fax:  [phone]

## 2014-08-16 ENCOUNTER — Other Ambulatory Visit: Payer: Self-pay | Admitting: *Deleted

## 2014-08-17 ENCOUNTER — Ambulatory Visit: Payer: 59 | Admitting: Occupational Therapy

## 2014-08-17 MED ORDER — AMLODIPINE BESYLATE 10 MG PO TABS: 10.0000 mg | ORAL_TABLET | Freq: Every day | ORAL | Status: DC

## 2014-08-19 ENCOUNTER — Encounter: Payer: 59 | Admitting: Occupational Therapy

## 2014-08-19 DIAGNOSIS — Z9889 Other specified postprocedural states: Secondary | ICD-10-CM | POA: Insufficient documentation

## 2014-08-24 ENCOUNTER — Encounter: Payer: 59 | Admitting: Occupational Therapy

## 2014-08-26 ENCOUNTER — Encounter: Payer: 59 | Admitting: Occupational Therapy

## 2014-08-31 ENCOUNTER — Ambulatory Visit: Payer: 59 | Admitting: Occupational Therapy

## 2014-09-02 ENCOUNTER — Encounter: Payer: 59 | Admitting: Occupational Therapy

## 2014-09-07 ENCOUNTER — Encounter: Payer: 59 | Admitting: Occupational Therapy

## 2014-10-17 ENCOUNTER — Other Ambulatory Visit: Payer: Self-pay | Admitting: Internal Medicine

## 2014-11-19 ENCOUNTER — Other Ambulatory Visit: Payer: Self-pay | Admitting: Internal Medicine

## 2015-01-18 ENCOUNTER — Encounter: Payer: Self-pay | Admitting: *Deleted

## 2015-02-08 ENCOUNTER — Encounter: Payer: 59 | Admitting: Internal Medicine

## 2015-02-22 ENCOUNTER — Ambulatory Visit (INDEPENDENT_AMBULATORY_CARE_PROVIDER_SITE_OTHER): Payer: 59 | Admitting: Internal Medicine

## 2015-02-22 ENCOUNTER — Encounter: Payer: Self-pay | Admitting: Internal Medicine

## 2015-02-22 VITALS — BP 135/47 | HR 60 | Temp 97.8°F | Ht 71.0 in | Wt 300.2 lb

## 2015-02-22 DIAGNOSIS — I10 Essential (primary) hypertension: Secondary | ICD-10-CM

## 2015-02-22 DIAGNOSIS — E785 Hyperlipidemia, unspecified: Secondary | ICD-10-CM

## 2015-02-22 DIAGNOSIS — M7989 Other specified soft tissue disorders: Secondary | ICD-10-CM

## 2015-02-22 DIAGNOSIS — E1165 Type 2 diabetes mellitus with hyperglycemia: Secondary | ICD-10-CM | POA: Diagnosis not present

## 2015-02-22 DIAGNOSIS — E118 Type 2 diabetes mellitus with unspecified complications: Principal | ICD-10-CM

## 2015-02-22 DIAGNOSIS — Z0000 Encounter for general adult medical examination without abnormal findings: Secondary | ICD-10-CM

## 2015-02-22 DIAGNOSIS — IMO0002 Reserved for concepts with insufficient information to code with codable children: Secondary | ICD-10-CM

## 2015-02-22 LAB — POCT GLYCOSYLATED HEMOGLOBIN (HGB A1C): Hemoglobin A1C: 7.6

## 2015-02-22 LAB — GLUCOSE, CAPILLARY: GLUCOSE-CAPILLARY: 210 mg/dL — AB (ref 65–99)

## 2015-02-22 MED ORDER — ATORVASTATIN CALCIUM 40 MG PO TABS: 40.0000 mg | ORAL_TABLET | Freq: Every day | ORAL | Status: DC

## 2015-02-22 NOTE — Assessment & Plan Note (Signed)
Patient declines flu shot, states he gets one at work.

## 2015-02-22 NOTE — Assessment & Plan Note (Signed)
Resolved. Has completed work w/ OT, says he still has some very fine motor skill deficits, but otherwise feels his function has returned to normal.

## 2015-02-22 NOTE — Assessment & Plan Note (Addendum)
ASCVD risk states 27.7% 10 year risk.  -Changed to high intensity statin. Lipitor 40 mg qhs

## 2015-02-22 NOTE — Patient Instructions (Signed)
1. Please schedule a follow up appointment in 6 months unless something comes up.   2. Please take all medications as previously prescribed with the following changes:  STOP taking Pravastatin (pravachol). START taking Atorvastatin (lipitor) 40 mg once in the evening instead. This is a stronger medication to help control your cholesterol.   Continue to see eye doctor, kidney doctor, and diabetes doctor regularly.   3. If you have worsening of your symptoms or new symptoms arise, please call the clinic [phone]), or go to the ER immediately if symptoms are severe.  You have done a great job in taking all your medications. Please continue to do this.

## 2015-02-22 NOTE — Assessment & Plan Note (Signed)
Lab Results  Component Value Date   HGBA1C 7.6 02/22/2015   HGBA1C 8.3 05/24/2014   HGBA1C 8.8 10/20/2012     Assessment: Diabetes control:  Improved.  Comments: Patient states he follows w/ Dr. Chalmers Cater who has him using an injectable pen twice daily but does not know the name. Does not think it is a type of insulin as he claims he was on Lantus in the past.   Plan: Medications:  continue current medications. Had long talk about weight gain. Reinforced healthy diet and exercise, declined visit w/ diabetes education.  Home glucose monitoring: Frequency:  bid Instruction/counseling given: reminded to get eye exam (follows w/ Herbert Deaner, to see in 1-2 weeks per patient).  Other plans: Recounsel on diet/exercise at next visit and reconsideration of meeting w/ Debera Lat.

## 2015-02-22 NOTE — Progress Notes (Signed)
Subjective:   Patient ID: [name] male   DOB: November 06, 1955 59 y.o.   MRN: [id number]  HPI: [name] is a 59 y.o. male w/ PMHx of HTN, DM type II, HLD, GERD, and CKD stage 3, presents to the clinic today for a follow-up visit for his chronic medical issues, namely DM and HTN. Patient follows intermittently with Dr. Chalmers [name] for his DM type II, as well as Dr. Lorrene [name] for his CKD. States he has been doing well. Patient had previously had some issues w/ his right hand involving cellulitis, however, this has completely resolved and he states his hand function has returned to normal. Patient does state he has gained some weight recently (~10 lbs since his last visit). Otherwise has no complaints.     Current Outpatient Prescriptions  Medication Sig Dispense Refill  . acetaminophen (TYLENOL) 500 MG tablet Take 500 mg by mouth every 6 (six) hours as needed.      Marland Kitchen amLODipine (NORVASC) 10 MG tablet Take 1 tablet (10 mg total) by mouth daily. 90 tablet 1  . aspirin 81 MG tablet Take 81 mg by mouth daily.      . Blood Glucose Monitoring Suppl (ONE TOUCH ULTRA MINI) W/DEVICE KIT Use to check blood sugars during the day at work before and after lunch     . carvedilol (COREG) 25 MG tablet TAKE ONE TABLET BY MOUTH TWICE DAILY WITH MEALS 60 tablet 5  . furosemide (LASIX) 80 MG tablet TAKE ONE TABLET BY MOUTH ONCE DAILY 30 tablet 5  . Glucosamine-Chondroitin (OSTEO BI-FLEX REGULAR STRENGTH) 250-200 MG TABS Take 4 caplets per day with food     . glucose blood (ONE TOUCH ULTRA TEST) test strip 1 each by Other route as needed for other. Use 3 to 4 times daily to check blood sugar. diag code 250.72. Insulin dependent 100 each 5  . Insulin Pen Needle 31G X 6 MM MISC Twice a day. 100 each 3  . Lancets (ONETOUCH ULTRASOFT) lancets Use to test your blood sugar before each meal each day     . lisinopril (PRINIVIL,ZESTRIL) 20 MG tablet TAKE ONE & ONE-HALF TABLETS BY MOUTH ONCE DAILY 45 tablet 5  . Omega-3  Fatty Acids (FISH OIL) 1200 MG CAPS Take 2 capsules by mouth daily.      . pravastatin (PRAVACHOL) 40 MG tablet TAKE TWO TABLETS BY MOUTH IN THE EVENING 60 tablet 5  . RELION PEN NEEDLE 31G/8MM 31G X 8 MM MISC USE AS DIRECTED TWICE DAILY (Patient not taking: Reported on 05/24/2014) 100 each 3  . traMADol (ULTRAM) 50 MG tablet TAKE ONE TABLET BY MOUTH THREE TIMES DAILY AS NEEDED FOR PAIN 15 tablet 0  . Vitamin D, Ergocalciferol, (DRISDOL) 50000 UNITS CAPS      No current facility-administered medications for this visit.   Review of Systems  General: Positive for weight gain. Denies fever, diaphoresis, appetite change, and fatigue.  Respiratory: Denies SOB, cough, and wheezing.   Cardiovascular: Denies chest pain and palpitations.  Gastrointestinal: Denies nausea, vomiting, abdominal pain, and diarrhea Musculoskeletal: Denies myalgias, arthralgias, back pain, and gait problem.  Neurological: Denies dizziness, syncope, weakness, lightheadedness, and headaches.  Psychiatric/Behavioral: Denies mood changes, sleep disturbance, and agitation.  Objective:   Physical Exam: Filed Vitals:   02/22/15 1358  BP: 135/47  Pulse: 60  Temp: 97.8 F (36.6 C)  TempSrc: Oral  Height: 5' 11"  (1.803 m)  Weight: 300 lb 3.2 oz (136.17 kg)  SpO2: 100%  General: Obese AA male, alert, cooperative, NAD. HEENT: PERRL, EOMI. Moist mucus membranes Neck: Full range of motion without pain, supple, no lymphadenopathy or carotid bruits Lungs: Clear to ascultation bilaterally, normal work of respiration, no wheezes, rales, rhonchi Heart: RRR, no murmurs, gallops, or rubs Abdomen: Soft, non-tender, non-distended, BS + Extremities: No cyanosis, clubbing, or edema Neurologic: Alert & oriented X3, cranial nerves II-XII intact, strength grossly intact, sensation intact to light touch   Assessment & Plan:   Please see problem based assessment and plan.

## 2015-02-22 NOTE — Assessment & Plan Note (Addendum)
BP Readings from Last 3 Encounters:  02/22/15 135/47  06/22/14 147/69  06/14/14 159/69    Lab Results  Component Value Date   NA 138 06/14/2014   K 4.4 06/14/2014   CREATININE 1.93* 06/14/2014    Assessment: Blood pressure control:  At goal Comments: Taking Lasix 80 mg daily, Lisinopril 30 mg daily, Norvasc 10 mg daily, and Coreg 25 mg bid.  Plan: Medications:  continue current medications Other plans: None

## 2015-02-23 NOTE — Progress Notes (Signed)
Internal Medicine Clinic Attending  Case discussed with Dr. Jones at the time of the visit.  We reviewed the resident's history and exam and pertinent patient test results.  I agree with the assessment, diagnosis, and plan of care documented in the resident's note.  

## 2015-03-28 ENCOUNTER — Other Ambulatory Visit: Payer: Self-pay | Admitting: Internal Medicine

## 2015-03-28 DIAGNOSIS — I10 Essential (primary) hypertension: Secondary | ICD-10-CM

## 2015-04-03 ENCOUNTER — Other Ambulatory Visit: Payer: Self-pay | Admitting: Internal Medicine

## 2015-05-04 ENCOUNTER — Other Ambulatory Visit: Payer: Self-pay | Admitting: Internal Medicine

## 2015-08-15 ENCOUNTER — Telehealth: Payer: Self-pay | Admitting: Internal Medicine

## 2015-08-15 NOTE — Telephone Encounter (Signed)
APPT. REMINDER CALL, LMTCB °

## 2015-08-16 ENCOUNTER — Encounter: Payer: Self-pay | Admitting: Internal Medicine

## 2015-08-16 ENCOUNTER — Ambulatory Visit (HOSPITAL_COMMUNITY)
Admission: RE | Admit: 2015-08-16 | Discharge: 2015-08-16 | Disposition: A | Discharge status: Home / Self Care | Payer: Self-pay | Source: Ambulatory Visit | Attending: Internal Medicine | Admitting: Internal Medicine

## 2015-08-16 ENCOUNTER — Ambulatory Visit (INDEPENDENT_AMBULATORY_CARE_PROVIDER_SITE_OTHER): Payer: Self-pay | Admitting: Internal Medicine

## 2015-08-16 VITALS — BP 150/63 | HR 75 | Temp 98.0°F | Ht 71.0 in | Wt 314.7 lb

## 2015-08-16 DIAGNOSIS — M7989 Other specified soft tissue disorders: Secondary | ICD-10-CM | POA: Insufficient documentation

## 2015-08-16 DIAGNOSIS — E1122 Type 2 diabetes mellitus with diabetic chronic kidney disease: Secondary | ICD-10-CM

## 2015-08-16 DIAGNOSIS — Z0000 Encounter for general adult medical examination without abnormal findings: Secondary | ICD-10-CM

## 2015-08-16 DIAGNOSIS — N183 Chronic kidney disease, stage 3 unspecified: Secondary | ICD-10-CM

## 2015-08-16 DIAGNOSIS — IMO0002 Reserved for concepts with insufficient information to code with codable children: Secondary | ICD-10-CM

## 2015-08-16 DIAGNOSIS — R600 Localized edema: Secondary | ICD-10-CM

## 2015-08-16 DIAGNOSIS — I129 Hypertensive chronic kidney disease with stage 1 through stage 4 chronic kidney disease, or unspecified chronic kidney disease: Secondary | ICD-10-CM

## 2015-08-16 DIAGNOSIS — Z794 Long term (current) use of insulin: Secondary | ICD-10-CM

## 2015-08-16 DIAGNOSIS — E118 Type 2 diabetes mellitus with unspecified complications: Principal | ICD-10-CM

## 2015-08-16 DIAGNOSIS — I10 Essential (primary) hypertension: Secondary | ICD-10-CM

## 2015-08-16 DIAGNOSIS — E1165 Type 2 diabetes mellitus with hyperglycemia: Secondary | ICD-10-CM

## 2015-08-16 LAB — POCT GLYCOSYLATED HEMOGLOBIN (HGB A1C): Hemoglobin A1C: 9

## 2015-08-16 LAB — GLUCOSE, CAPILLARY: Glucose-Capillary: 182 mg/dL — ABNORMAL HIGH (ref 65–99)

## 2015-08-16 NOTE — Progress Notes (Signed)
Subjective:   Patient ID: [name] male   DOB: Oct 13, 1955 60 y.o.   MRN: [id number]  HPI: [name] is a 60 y.o. male w/ PMHx of HTN, DM type II, HLD, GERD, and CKD stage 3, presents to the clinic today for a follow-up visit for his chronic medical issues, namely DM and HTN. Patient has recently retired and admits to being less careful about his diet and getting exercise. Has gained a significant amount of weight over the years, 15 lbs in the past 3-6 months. Also states he no longer has health insurance. Does not seem to have many complaints however, Says he has been taking his medications.   Follows with Dr. Chalmers Cater for his DM type II, and Dr. Lorrene Reid for his CKD. Is not sure the last time he saw either of them or when he is supposed to go back.   Does admit to some leg swelling, more so on the right leg. No pain.   Current Outpatient Prescriptions  Medication Sig Dispense Refill  . acetaminophen (TYLENOL) 500 MG tablet Take 500 mg by mouth every 6 (six) hours as needed.      Marland Kitchen amLODipine (NORVASC) 10 MG tablet TAKE ONE TABLET BY MOUTH ONCE DAILY 90 tablet 1  . aspirin 81 MG tablet Take 81 mg by mouth daily.      Marland Kitchen atorvastatin (LIPITOR) 40 MG tablet Take 1 tablet (40 mg total) by mouth daily. 30 tablet 5  . Blood Glucose Monitoring Suppl (ONE TOUCH ULTRA MINI) W/DEVICE KIT Use to check blood sugars during the day at work before and after lunch     . carvedilol (COREG) 25 MG tablet TAKE ONE TABLET BY MOUTH TWICE DAILY WITH MEALS 60 tablet 2  . furosemide (LASIX) 80 MG tablet TAKE ONE TABLET BY MOUTH ONCE DAILY 30 tablet 2  . Glucosamine-Chondroitin (OSTEO BI-FLEX REGULAR STRENGTH) 250-200 MG TABS Take 4 caplets per day with food     . glucose blood (ONE TOUCH ULTRA TEST) test strip 1 each by Other route as needed for other. Use 3 to 4 times daily to check blood sugar. diag code 250.72. Insulin dependent 100 each 5  . Insulin Pen Needle 31G X 6 MM MISC Twice a day. 100 each 3  .  Lancets (ONETOUCH ULTRASOFT) lancets Use to test your blood sugar before each meal each day     . lisinopril (PRINIVIL,ZESTRIL) 20 MG tablet TAKE ONE & ONE-HALF TABLETS BY MOUTH ONCE DAILY 45 tablet 5  . Omega-3 Fatty Acids (FISH OIL) 1200 MG CAPS Take 2 capsules by mouth daily.      Marland Kitchen RELION PEN NEEDLE 31G/8MM 31G X 8 MM MISC USE AS DIRECTED TWICE DAILY (Patient not taking: Reported on 05/24/2014) 100 each 3  . traMADol (ULTRAM) 50 MG tablet TAKE ONE TABLET BY MOUTH THREE TIMES DAILY AS NEEDED FOR PAIN 15 tablet 0  . Vitamin D, Ergocalciferol, (DRISDOL) 50000 UNITS CAPS      No current facility-administered medications for this visit.   Review of Systems  General: Positive for weight gain. Denies fever, diaphoresis, appetite change, and fatigue.  Respiratory: Denies SOB, cough, and wheezing.   Cardiovascular: Denies chest pain and palpitations.  Gastrointestinal: Denies nausea, vomiting, abdominal pain, and diarrhea Musculoskeletal: Positive for leg swelling. Denies myalgias, arthralgias, back pain, and gait problem.  Neurological: Denies dizziness, syncope, weakness, lightheadedness, and headaches.  Psychiatric/Behavioral: Denies mood changes, sleep disturbance, and agitation.  Objective:   Physical Exam: Danley Danker  Vitals:   08/16/15 1536  BP: 150/63  Pulse: 75  Temp: 98 F (36.7 C)  TempSrc: Oral  Height: 5' 11" (1.803 m)  Weight: 314 lb 11.2 oz (142.747 kg)  SpO2: 98%   General: Obese AA male, alert, cooperative, NAD. HEENT: PERRL, EOMI. Moist mucus membranes Neck: Full range of motion without pain, supple, no lymphadenopathy or carotid bruits Lungs: Air entry equal bilaterally, faint bibasilar rales. No wheezes or rhonchi.  Heart: RRR, no murmurs, gallops, or rubs Abdomen: Soft, non-tender, non-distended, BS + Extremities: No cyanosis, clubbing. +3 pitting edema bilaterally, although the right lef is significantly larger in size than the left. RLE has edema that extends up  into the thigh. Appears erythematous, although non-tender, Pratt's and Homan's sign negative.  Neurologic: Alert & oriented X3, cranial nerves II-XII intact, strength grossly intact, sensation intact to light touch   Assessment & Plan:   Please see problem based assessment and plan.

## 2015-08-16 NOTE — Patient Instructions (Addendum)
1. Please return on Friday.  2. Please take all medications as previously prescribed with the following changes:  Please increase Lasix to 80 mg twice daily for the next 2 days.   3. If you have worsening of your symptoms or new symptoms arise, please call the clinic [phone]), or go to the ER immediately if symptoms are severe.

## 2015-08-16 NOTE — Progress Notes (Signed)
*  PRELIMINARY RESULTS* Vascular Ultrasound Right lower extremity venous duplex has been completed.  Preliminary findings: No evidence of DVT in visualized veins or baker's cyst.  Called results to Dr. Ronnald Ramp.   Landry Mellow, RDMS, RVT  08/16/2015, 5:03 PM

## 2015-08-17 NOTE — Progress Notes (Signed)
Internal Medicine Clinic Attending  I saw and evaluated the patient.  I personally confirmed the key portions of the history and exam documented by Dr. Jones and I reviewed pertinent patient test results.  The assessment, diagnosis, and plan were formulated together and I agree with the documentation in the resident's note.     

## 2015-08-17 NOTE — Assessment & Plan Note (Signed)
Lab Results  Component Value Date   HGBA1C 9.0 08/16/2015   HGBA1C 7.6 02/22/2015   HGBA1C 8.3 05/24/2014     Assessment: Diabetes control:  Poorly controlled Progress toward A1C goal:   Deteriorated Comments: Patient brought in medications today, it appears he is taking 75/25 insulin, 50 units in the AM, 70 units in the PM. Follows with Dr. Chalmers Cater, but is not sure when he is to follow up.   Plan: Medications:  continue current medications for now. Will need to make adjustments to his meds at his next visit.  Home glucose monitoring: Frequency:  bid Instruction/counseling given: discussed the need for weight loss and discussed diet Educational resources provided: brochure (denies) Self management tools provided:   Other plans: Patient does not have insurance currently as he has recently retired. Reinforced importance of getting this taken care of in order to optimize his medications. RTC in 3 days for further management of his edema.

## 2015-08-17 NOTE — Assessment & Plan Note (Signed)
Did not want labs checked today as he does not have insurance. Appears volume overloaded. Please see A&P regarding LE edema.  -Increase Lasix to 80 mg bid for 2 days. -Will follow up in 3 days, will check CMP at that time. Would also check UA, urine protein/cr given his significant edema. Previous microalbumin/cr ratio was 1980 mg/g in 2012.  -Needs to follow up with [facility] as well.

## 2015-08-17 NOTE — Assessment & Plan Note (Signed)
Declined healthcare maintenance given his lack of health insurance.

## 2015-08-17 NOTE — Assessment & Plan Note (Signed)
Patient has bilateral +3 pitting edema, although the swelling is much more significant on the right, very tense, extensive, erythematous, and warm to the touch. Non-tender, Pratt's and Homans' negative. Do not suspect cellulitis. Well's score for DVT makes him high risk. LE doppler performed, results called STAT from tech reported as NEGATIVE for DVT. Full report pending. Given severity of swelling, feel this needs close monitoring. May be related to Norvasc in combination with venous stasis, may also be related to nephrotic syndrome given his previously elevated urine microalbumin/cr of 1900 in 2012. Has a known history of CKD. Patient has very mild crackles in the bases of his lungs, but no JVD, no symptoms of CHF. ECHO from 2013 normal. Patient is very reluctant to work this up further given his lack of insurance currently. Has recently retired.  -Increase Lasix to 80 mg bid for 2 days, patient will return in 3 days for follow up -Follow up formal report on doppler -Plan on CMP, urine protein/cr, BNP, and UA at next clinic visit. Based on these results, can manage further; ie: follow up with Dr. Lorrene Reid, ECHO, etc. If workup negative, can attribute more to CCB and venous stasis.

## 2015-08-17 NOTE — Assessment & Plan Note (Signed)
BP Readings from Last 3 Encounters:  08/16/15 150/63  02/22/15 135/47  06/22/14 147/69    Lab Results  Component Value Date   NA 138 06/14/2014   K 4.4 06/14/2014   CREATININE 1.93* 06/14/2014    Assessment: Blood pressure control:  Elevated Comments: Taking Lasix 80 mg daily, Coreg 25 mg bid, and Norvasc 10 mg daily.   Plan: Medications:  continue current medications. Increase Lasix to 80 mg bid for 2 days, then continue current medications. May need to make further adjustments at next visit.  Other plans: Patient is to return in 3 days for further management of his LE edema, will further address BP at that time.

## 2015-08-19 ENCOUNTER — Other Ambulatory Visit: Payer: Self-pay | Admitting: Pharmacist

## 2015-08-19 ENCOUNTER — Ambulatory Visit (INDEPENDENT_AMBULATORY_CARE_PROVIDER_SITE_OTHER): Payer: Self-pay | Admitting: Internal Medicine

## 2015-08-19 ENCOUNTER — Ambulatory Visit: Payer: Self-pay | Admitting: Pharmacist

## 2015-08-19 VITALS — BP 136/64 | HR 70 | Temp 97.8°F | Wt 310.2 lb

## 2015-08-19 DIAGNOSIS — M7989 Other specified soft tissue disorders: Secondary | ICD-10-CM

## 2015-08-19 DIAGNOSIS — E1122 Type 2 diabetes mellitus with diabetic chronic kidney disease: Secondary | ICD-10-CM

## 2015-08-19 DIAGNOSIS — I129 Hypertensive chronic kidney disease with stage 1 through stage 4 chronic kidney disease, or unspecified chronic kidney disease: Secondary | ICD-10-CM

## 2015-08-19 DIAGNOSIS — E118 Type 2 diabetes mellitus with unspecified complications: Secondary | ICD-10-CM

## 2015-08-19 DIAGNOSIS — E785 Hyperlipidemia, unspecified: Secondary | ICD-10-CM

## 2015-08-19 DIAGNOSIS — I10 Essential (primary) hypertension: Secondary | ICD-10-CM

## 2015-08-19 DIAGNOSIS — N183 Chronic kidney disease, stage 3 (moderate): Secondary | ICD-10-CM

## 2015-08-19 DIAGNOSIS — Z794 Long term (current) use of insulin: Secondary | ICD-10-CM

## 2015-08-19 DIAGNOSIS — IMO0002 Reserved for concepts with insufficient information to code with codable children: Secondary | ICD-10-CM

## 2015-08-19 DIAGNOSIS — E1165 Type 2 diabetes mellitus with hyperglycemia: Secondary | ICD-10-CM

## 2015-08-19 LAB — GLUCOSE, CAPILLARY: Glucose-Capillary: 212 mg/dL — ABNORMAL HIGH (ref 65–99)

## 2015-08-19 LAB — BRAIN NATRIURETIC PEPTIDE: B Natriuretic Peptide: 40.8 pg/mL (ref 0.0–100.0)

## 2015-08-19 MED ORDER — CARVEDILOL 25 MG PO TABS: 25.0000 mg | ORAL_TABLET | Freq: Two times a day (BID) | ORAL | Status: DC

## 2015-08-19 MED ORDER — LISINOPRIL 20 MG PO TABS: 30.0000 mg | ORAL_TABLET | Freq: Every day | ORAL | Status: DC

## 2015-08-19 MED ORDER — ATORVASTATIN CALCIUM 40 MG PO TABS: 40.0000 mg | ORAL_TABLET | Freq: Every day | ORAL | Status: DC

## 2015-08-19 MED ORDER — INSULIN LISPRO PROT & LISPRO (75-25 MIX) 100 UNIT/ML KWIKPEN: PEN_INJECTOR | SUBCUTANEOUS | Status: DC

## 2015-08-19 MED ORDER — AMLODIPINE BESYLATE 10 MG PO TABS: 10.0000 mg | ORAL_TABLET | Freq: Every day | ORAL | Status: DC

## 2015-08-19 MED ORDER — FUROSEMIDE 80 MG PO TABS: 80.0000 mg | ORAL_TABLET | Freq: Every day | ORAL | Status: DC

## 2015-08-19 MED FILL — FUROSEMIDE 80 MG TABLET: 80 | 30 days supply | Qty: 30 | Fill #0

## 2015-08-19 MED FILL — *HUMALOG KWIK MIX 75/25: (75-25) 100 | 13 days supply | Qty: 15 | Fill #0

## 2015-08-19 MED FILL — AMLODIPINE BESYLATE 10 MG T: 10 | 30 days supply | Qty: 30 | Fill #0

## 2015-08-19 MED FILL — LISINOPRIL 20 MG TABLET: 20 | 30 days supply | Qty: 45 | Fill #0

## 2015-08-19 MED FILL — ATORVASTATIN 40 MG TABLET: 40 | 30 days supply | Qty: 30 | Fill #0

## 2015-08-19 MED FILL — CARVEDILOL 25 MG TABLET: 25 | 30 days supply | Qty: 60 | Fill #0

## 2015-08-19 NOTE — Progress Notes (Signed)
Patient referred for assistance with medication. Collaborated with Dr. Ronnald Ramp, [facility] pharmacy, and Geriatrics Task Force to assist patient. Recommend to refer patient to financial counselor for potential [employer] and ongoing med access through [facility]. Medications being provided by [facility] outpatient pharmacy for 1-time fill today. Will continue to collaborate in the care of this patient.

## 2015-08-19 NOTE — Progress Notes (Signed)
Subjective:   Patient ID: [name] male   DOB: 10-14-1955 60 y.o.   MRN: [id number]  HPI: [name] is a 60 y.o. male w/ PMHx of HTN, DM type II, HLD, GERD, and CKD stage 3, presents to the clinic today for a follow-up visit for his LLE swelling. Patient was seen recently for LE swelling and concern for RLE DVT, however, LE doppler was negative. He was sent home and increased his Lasix to 80 mg bid and feels his swelling improved somewhat. Weight decreased ~4 lbs since previous visit. No SOB, chest pain, or dizziness. No pain in the leg. Says he is making good urine. Has no further complaints today.   Current Outpatient Prescriptions  Medication Sig Dispense Refill  . acetaminophen (TYLENOL) 500 MG tablet Take 500 mg by mouth every 6 (six) hours as needed.      Marland Kitchen amLODipine (NORVASC) 10 MG tablet TAKE ONE TABLET BY MOUTH ONCE DAILY 90 tablet 1  . aspirin 81 MG tablet Take 81 mg by mouth daily.      Marland Kitchen atorvastatin (LIPITOR) 40 MG tablet Take 1 tablet (40 mg total) by mouth daily. 30 tablet 5  . Blood Glucose Monitoring Suppl (ONE TOUCH ULTRA MINI) W/DEVICE KIT Use to check blood sugars during the day at work before and after lunch     . carvedilol (COREG) 25 MG tablet TAKE ONE TABLET BY MOUTH TWICE DAILY WITH MEALS 60 tablet 2  . furosemide (LASIX) 80 MG tablet TAKE ONE TABLET BY MOUTH ONCE DAILY 30 tablet 2  . Glucosamine-Chondroitin (OSTEO BI-FLEX REGULAR STRENGTH) 250-200 MG TABS Take 4 caplets per day with food     . glucose blood (ONE TOUCH ULTRA TEST) test strip 1 each by Other route as needed for other. Use 3 to 4 times daily to check blood sugar. diag code 250.72. Insulin dependent 100 each 5  . Insulin Lispro Prot & Lispro (HUMALOG MIX 75/25 KWIKPEN) (75-25) 100 UNIT/ML Kwikpen Inject into the skin twice daily (50 units with breakfast and 70 units with dinner).340B Steve 15 mL 0  . Insulin Pen Needle 31G X 6 MM MISC Twice a day. 100 each 3  . Lancets (ONETOUCH ULTRASOFT)  lancets Use to test your blood sugar before each meal each day     . lisinopril (PRINIVIL,ZESTRIL) 20 MG tablet TAKE ONE & ONE-HALF TABLETS BY MOUTH ONCE DAILY 45 tablet 5  . Omega-3 Fatty Acids (FISH OIL) 1200 MG CAPS Take 2 capsules by mouth daily.      Marland Kitchen RELION PEN NEEDLE 31G/8MM 31G X 8 MM MISC USE AS DIRECTED TWICE DAILY (Patient not taking: Reported on 05/24/2014) 100 each 3  . Vitamin D, Ergocalciferol, (DRISDOL) 50000 UNITS CAPS      No current facility-administered medications for this visit.   Review of Systems  General: Denies fever, diaphoresis, appetite change, and fatigue.  Respiratory: Denies SOB, cough, and wheezing.   Cardiovascular: Denies chest pain and palpitations.  Gastrointestinal: Denies nausea, vomiting, abdominal pain, and diarrhea Musculoskeletal: Positive for leg swelling. Denies myalgias, arthralgias, back pain, and gait problem.  Neurological: Denies dizziness, syncope, weakness, lightheadedness, and headaches.  Psychiatric/Behavioral: Denies mood changes, sleep disturbance, and agitation.  Objective:   Physical Exam: Filed Vitals:   08/19/15 1324  BP: 136/64  Pulse: 70  Temp: 97.8 F (36.6 C)  TempSrc: Oral  Weight: 310 lb 3.2 oz (140.706 kg)  SpO2: 97%   General: Obese AA male, alert, cooperative, NAD. HEENT:  PERRL, EOMI. Moist mucus membranes Neck: Full range of motion without pain, supple, no lymphadenopathy or carotid bruits Lungs: Air entry equal bilaterally, faint bibasilar rales. No wheezes or rhonchi.  Heart: RRR, no murmurs, gallops, or rubs Abdomen: Soft, non-tender, non-distended, BS + Extremities: No cyanosis, clubbing. +3 pitting edema bilaterally, although the right leg is significantly larger in size than the left. RLE with overlying venous stasis changes. Neurologic: Alert & oriented X3, cranial nerves II-XII intact, strength grossly intact, sensation intact to light touch   Assessment & Plan:   Please see problem based  assessment and plan.  

## 2015-08-19 NOTE — Patient Instructions (Signed)
1. Please make a follow up for 2 weeks.   2. Please take all medications as previously prescribed with the following changes:  Take Lasix 80 mg again this evening, then resume normal dosing of 80 mg daily.   I will call you on Monday regarding your lab results.   Please look into health insurance coverage.   3. If you have worsening of your symptoms or new symptoms arise, please call the clinic [phone] or go to the ER immediately if symptoms are severe.

## 2015-08-20 LAB — URINALYSIS, ROUTINE W REFLEX MICROSCOPIC
BILIRUBIN UA: NEGATIVE
GLUCOSE, UA: NEGATIVE
KETONES UA: NEGATIVE
Nitrite, UA: NEGATIVE
RBC UA: NEGATIVE
SPEC GRAV UA: 1.013 (ref 1.005–1.030)
UUROB: 0.2 mg/dL (ref 0.2–1.0)
pH, UA: 5 (ref 5.0–7.5)

## 2015-08-20 LAB — CMP14 + ANION GAP
ALBUMIN: 3.7 g/dL (ref 3.6–4.8)
ALK PHOS: 107 IU/L (ref 39–117)
ALT: 9 IU/L (ref 0–44)
ANION GAP: 17 mmol/L (ref 10.0–18.0)
AST: 11 IU/L (ref 0–40)
Albumin/Globulin Ratio: 1.2 (ref 1.2–2.2)
BUN/Creatinine Ratio: 15 (ref 10–22)
BUN: 28 mg/dL — ABNORMAL HIGH (ref 8–27)
Bilirubin Total: 0.2 mg/dL (ref 0.0–1.2)
CO2: 23 mmol/L (ref 18–29)
CREATININE: 1.84 mg/dL — AB (ref 0.76–1.27)
Calcium: 9.1 mg/dL (ref 8.6–10.2)
Chloride: 104 mmol/L (ref 96–106)
GFR calc Af Amer: 44 mL/min/{1.73_m2} — ABNORMAL LOW (ref >59)
GFR calc non Af Amer: 38 mL/min/{1.73_m2} — ABNORMAL LOW (ref >59)
Globulin, Total: 3 g/dL (ref 1.5–4.5)
Glucose: 197 mg/dL — ABNORMAL HIGH (ref 65–99)
Potassium: 4.6 mmol/L (ref 3.5–5.2)
SODIUM: 144 mmol/L (ref 134–144)
Total Protein: 6.7 g/dL (ref 6.0–8.5)

## 2015-08-20 LAB — MICROALBUMIN / CREATININE URINE RATIO
CREATININE, UR: 83.9 mg/dL
MICROALB/CREAT RATIO: 535.6 mg/g creat — ABNORMAL HIGH (ref 0.0–30.0)
MICROALBUM., U, RANDOM: 449.4 ug/mL

## 2015-08-20 LAB — MICROSCOPIC EXAMINATION

## 2015-08-22 NOTE — Assessment & Plan Note (Signed)
Checked urine microalbumin/cr today, actually improved since 2012. Discussed importance of improving diet and follow up with Dr. Chalmers Cater.  -Will consider DM education once patient has insurance/orange card.  -Continue Insulin 75/25

## 2015-08-22 NOTE — Assessment & Plan Note (Signed)
Recent LE doppler negative for DVT. Other possibilities for LE swelling include nephrotic syndrome and CHF, although this would not typically cause an asymmetric edema. May also be related to Norvasc and venous stasis. Albumin 3.7, no obvious LFT abnormalities. Urine with protein, microalbumin/Cr 535.6, actually improved from 2012. BNP 40. Cr at baseline. Therefore, suspect that this is mostly related to CCB and venous stasis.  -Can consider discontinuing CCB at next visit and utilizing a different BP medication instead.  -Advised patient elevated legs when able.  -Continue Lasix 80 mg daily -RTC in 2 weeks

## 2015-08-22 NOTE — Assessment & Plan Note (Signed)
BP controlled. Patient has proteinuria (although improved) and asymmetric LE edema.  -Could consider increasing ACEI at next visit, although will need to do so with caution given CKD.  -Could also consider discontinuation of CCB given LE edema.

## 2015-08-23 NOTE — Progress Notes (Signed)
Internal Medicine Clinic Attending  Case discussed with Dr. Jones soon after the resident saw the patient.  We reviewed the resident's history and exam and pertinent patient test results.  I agree with the assessment, diagnosis, and plan of care documented in the resident's note. 

## 2015-09-02 ENCOUNTER — Ambulatory Visit: Payer: Self-pay | Admitting: Internal Medicine

## 2015-09-06 ENCOUNTER — Ambulatory Visit: Payer: Self-pay

## 2015-10-17 ENCOUNTER — Telehealth: Payer: Self-pay | Admitting: Internal Medicine

## 2015-10-17 NOTE — Telephone Encounter (Signed)
PT. REMINDER CALL, LMTCB °

## 2015-10-18 ENCOUNTER — Ambulatory Visit (INDEPENDENT_AMBULATORY_CARE_PROVIDER_SITE_OTHER): Payer: Self-pay | Admitting: Internal Medicine

## 2015-10-18 ENCOUNTER — Encounter: Payer: Self-pay | Admitting: Internal Medicine

## 2015-10-18 VITALS — BP 127/73 | HR 63 | Temp 97.5°F | Ht 71.0 in | Wt 307.2 lb

## 2015-10-18 DIAGNOSIS — Z794 Long term (current) use of insulin: Secondary | ICD-10-CM

## 2015-10-18 DIAGNOSIS — Z6841 Body Mass Index (BMI) 40.0 and over, adult: Secondary | ICD-10-CM

## 2015-10-18 DIAGNOSIS — Z0000 Encounter for general adult medical examination without abnormal findings: Secondary | ICD-10-CM

## 2015-10-18 DIAGNOSIS — I10 Essential (primary) hypertension: Secondary | ICD-10-CM

## 2015-10-18 DIAGNOSIS — E118 Type 2 diabetes mellitus with unspecified complications: Principal | ICD-10-CM

## 2015-10-18 DIAGNOSIS — IMO0002 Reserved for concepts with insufficient information to code with codable children: Secondary | ICD-10-CM

## 2015-10-18 DIAGNOSIS — E1165 Type 2 diabetes mellitus with hyperglycemia: Secondary | ICD-10-CM

## 2015-10-18 LAB — GLUCOSE, CAPILLARY: Glucose-Capillary: 166 mg/dL — ABNORMAL HIGH (ref 65–99)

## 2015-10-18 NOTE — Patient Instructions (Addendum)
1. Please return for follow up in 6 weeks.   2. Please take all medications as previously prescribed.  3. If you have worsening of your symptoms or new symptoms arise, please call the clinic [phone]), or go to the ER immediately if symptoms are severe.  You have done a great job in taking all your medications. Please continue to do this.

## 2015-10-19 NOTE — Assessment & Plan Note (Signed)
Patient says he has been attempting to lose weight and eat healthier. Still not exercising much since his retirement. Discussed weight loss extensively today.

## 2015-10-19 NOTE — Assessment & Plan Note (Signed)
BP Readings from Last 3 Encounters:  10/18/15 127/73  08/19/15 136/64  08/16/15 150/63    Lab Results  Component Value Date   NA 144 08/19/2015   K 4.6 08/19/2015   CREATININE 1.84* 08/19/2015    Assessment: Blood pressure control:  Well controlled Progress toward BP goal:   At goal Comments: Taking Lasix 80 mg daily, Lisinopril 30 mg daily, Coreg 25 mg bid, Norvasc 10 mg daily.   Plan: Medications:  continue current medications Other plans: RTC in 6 weeks

## 2015-10-19 NOTE — Progress Notes (Signed)
Subjective:   Patient ID: [name] male   DOB: 13-Feb-1956 61 y.o.   MRN: [id number]  HPI: [name] is a 60 y.o. male w/ PMHx of HTN, DM type II, HLD, GERD, and CKD stage 3, presents to the clinic today for a follow-up visit for his LLE swelling. Today, he says he is feeling well, no significant complaints. Still has LE swelling, but thinks it isn't quite as bad as before. Says he has lost some weight. Denies any recent SOB, chest pain, difficulty sleeping, cough, palpitations, or dizziness.   Current Outpatient Prescriptions  Medication Sig Dispense Refill  . acetaminophen (TYLENOL) 500 MG tablet Take 500 mg by mouth every 6 (six) hours as needed.      Marland Kitchen amLODipine (NORVASC) 10 MG tablet TAKE ONE TABLET BY MOUTH ONCE DAILY 90 tablet 1  . aspirin 81 MG tablet Take 81 mg by mouth daily.      Marland Kitchen atorvastatin (LIPITOR) 40 MG tablet Take 1 tablet (40 mg total) by mouth daily. 30 tablet 5  . Blood Glucose Monitoring Suppl (ONE TOUCH ULTRA MINI) W/DEVICE KIT Use to check blood sugars during the day at work before and after lunch     . carvedilol (COREG) 25 MG tablet TAKE ONE TABLET BY MOUTH TWICE DAILY WITH MEALS 60 tablet 2  . furosemide (LASIX) 80 MG tablet TAKE ONE TABLET BY MOUTH ONCE DAILY 30 tablet 2  . Glucosamine-Chondroitin (OSTEO BI-FLEX REGULAR STRENGTH) 250-200 MG TABS Take 4 caplets per day with food     . glucose blood (ONE TOUCH ULTRA TEST) test strip 1 each by Other route as needed for other. Use 3 to 4 times daily to check blood sugar. diag code 250.72. Insulin dependent 100 each 5  . Insulin Lispro Prot & Lispro (HUMALOG MIX 75/25 KWIKPEN) (75-25) 100 UNIT/ML Kwikpen Inject into the skin twice daily (50 units with breakfast and 70 units with dinner).340B Steve 15 mL 0  . Insulin Pen Needle 31G X 6 MM MISC Twice a day. 100 each 3  . Lancets (ONETOUCH ULTRASOFT) lancets Use to test your blood sugar before each meal each day     . lisinopril (PRINIVIL,ZESTRIL) 20 MG  tablet TAKE ONE & ONE-HALF TABLETS BY MOUTH ONCE DAILY 45 tablet 5  . Omega-3 Fatty Acids (FISH OIL) 1200 MG CAPS Take 2 capsules by mouth daily.      Marland Kitchen RELION PEN NEEDLE 31G/8MM 31G X 8 MM MISC USE AS DIRECTED TWICE DAILY (Patient not taking: Reported on 05/24/2014) 100 each 3  . Vitamin D, Ergocalciferol, (DRISDOL) 50000 UNITS CAPS      No current facility-administered medications for this visit.   Review of Systems  General: Denies fever, diaphoresis, appetite change, and fatigue.  Respiratory: Denies SOB, cough, and wheezing.   Cardiovascular: Denies chest pain and palpitations.  Gastrointestinal: Denies nausea, vomiting, abdominal pain, and diarrhea Musculoskeletal: Positive for leg swelling. Denies myalgias, arthralgias, back pain, and gait problem.  Neurological: Denies dizziness, syncope, weakness, lightheadedness, and headaches.  Psychiatric/Behavioral: Denies mood changes, sleep disturbance, and agitation.  Objective:   Physical Exam: Filed Vitals:   10/18/15 1113 10/18/15 1138  BP: 162/77 127/73  Pulse: 67 63  Temp: 97.5 F (36.4 C)   TempSrc: Oral   Height: 5' 11"  (1.803 m)   Weight: 307 lb 3.2 oz (139.345 kg)   SpO2: 98%    General: Obese AA male, alert, cooperative, NAD. HEENT: PERRL, EOMI. Moist mucus membranes Neck: Full range  of motion without pain, supple, no lymphadenopathy or carotid bruits Lungs: Air entry equal bilaterally, faint bibasilar rales. No wheezes or rhonchi.  Heart: RRR, no murmurs, gallops, or rubs Abdomen: Soft, non-tender, non-distended, BS + Extremities: No cyanosis, clubbing. +3 pitting edema bilaterally, R>L. Venous stasis changes.  Neurologic: Alert & oriented X3, cranial nerves II-XII intact, strength grossly intact, sensation intact to light touch   Assessment & Plan:   Please see problem based assessment and plan.

## 2015-10-19 NOTE — Assessment & Plan Note (Signed)
Foot exam today. 

## 2015-10-19 NOTE — Assessment & Plan Note (Signed)
Patient says he has been more compliant with his insulin, using Humalog 75/25, 70 in the AM, 50 in the PM, different than his Rx (has doses reversed). Says he has not had any symptoms of hypoglycemia. Asks about getting cheap insulin since he does not have insurance. Does not qualify for orange card.  -Continue Insulin 75/25 as stated above -Patient to go to health department to apply for MAP

## 2015-10-20 NOTE — Progress Notes (Signed)
Case discussed with Dr. Jones at the time of the visit.  We reviewed the resident's history and exam and pertinent patient test results.  I agree with the assessment, diagnosis, and plan of care documented in the resident's note. 

## 2015-10-31 ENCOUNTER — Other Ambulatory Visit: Payer: Self-pay | Admitting: Internal Medicine

## 2015-11-14 ENCOUNTER — Encounter: Payer: Self-pay | Admitting: *Deleted

## 2015-11-22 ENCOUNTER — Ambulatory Visit (INDEPENDENT_AMBULATORY_CARE_PROVIDER_SITE_OTHER): Payer: Self-pay | Admitting: Internal Medicine

## 2015-11-22 ENCOUNTER — Ambulatory Visit: Payer: Self-pay | Admitting: Pharmacist

## 2015-11-22 ENCOUNTER — Encounter: Payer: Self-pay | Admitting: Internal Medicine

## 2015-11-22 VITALS — BP 125/60 | HR 75 | Temp 98.3°F | Ht 71.0 in | Wt 296.5 lb

## 2015-11-22 DIAGNOSIS — E1165 Type 2 diabetes mellitus with hyperglycemia: Secondary | ICD-10-CM

## 2015-11-22 DIAGNOSIS — Z794 Long term (current) use of insulin: Secondary | ICD-10-CM

## 2015-11-22 DIAGNOSIS — IMO0002 Reserved for concepts with insufficient information to code with codable children: Secondary | ICD-10-CM

## 2015-11-22 DIAGNOSIS — E118 Type 2 diabetes mellitus with unspecified complications: Principal | ICD-10-CM

## 2015-11-22 DIAGNOSIS — Z79899 Other long term (current) drug therapy: Secondary | ICD-10-CM

## 2015-11-22 DIAGNOSIS — Z9114 Patient's other noncompliance with medication regimen: Secondary | ICD-10-CM

## 2015-11-22 DIAGNOSIS — Z598 Other problems related to housing and economic circumstances: Secondary | ICD-10-CM

## 2015-11-22 LAB — POCT GLYCOSYLATED HEMOGLOBIN (HGB A1C)

## 2015-11-22 LAB — GLUCOSE, CAPILLARY
GLUCOSE-CAPILLARY: 451 mg/dL — AB (ref 65–99)
Glucose-Capillary: 537 mg/dL — ABNORMAL HIGH (ref 65–99)

## 2015-11-22 MED ORDER — LIRAGLUTIDE 18 MG/3ML ~~LOC~~ SOPN: 0.6000 mg | PEN_INJECTOR | Freq: Every day | SUBCUTANEOUS | Status: DC

## 2015-11-22 MED ORDER — INSULIN LISPRO PROT & LISPRO (75-25 MIX) 100 UNIT/ML KWIKPEN: PEN_INJECTOR | SUBCUTANEOUS | Status: DC

## 2015-11-22 MED ORDER — INSULIN ASPART 100 UNIT/ML ~~LOC~~ SOLN
10.0000 [IU] | Freq: Once | SUBCUTANEOUS | Status: AC
  Administered 2015-11-22: 10 [IU] via SUBCUTANEOUS

## 2015-11-22 NOTE — Progress Notes (Signed)
Medication Samples have been provided to the patient.  Drug: Insulin aspart (Novolog) mix 70/30 Strength: 100 units/mL Qty: 2 [id number] Exp.Date: 9/17 Dosing instructions: Inject 50 units with breakfast and 70 units with dinner  The patient has been instructed regarding the correct time, dose, and frequency of taking this medication, including desired effects and most common side effects.   Kynleigh Artz J 3:35 PM 11/22/2015

## 2015-11-22 NOTE — Progress Notes (Signed)
Physician pharmacist co-visit

## 2015-11-22 NOTE — Patient Instructions (Signed)
1. Please make a follow up for 1-2 weeks.   2. Please take all medications as previously prescribed with the following changes:  Take Insulin as previously prescribed.   Start taking Victoza 0.6 mg once daily. This is an injection. After 1 week, increase the dose to 1.2 mg daily.   3. If you have worsening of your symptoms or new symptoms arise, please call the clinic [phone]), or go to the ER immediately if symptoms are severe.

## 2015-11-23 NOTE — Assessment & Plan Note (Addendum)
Lab Results  Component Value Date   HGBA1C >14.0 11/22/2015   HGBA1C 9.0 08/16/2015   HGBA1C 7.6 02/22/2015     Assessment: Diabetes control:  VERY poorly controlled Progress toward A1C goal:   NOT at goal Comments: Patient has not been taking the appropriate doses of insulin because he has been trying to conserve his pens. Unable to be on Metformin because of his kidney disease, though may tolerate lowest dose in the future (something to consider). Recently went to [facility] Department to apply for MAP but he says the paperwork has not ben finalized yet. CBG 500's today, asymptomatic. Given Novolog 10 units once in clinic with improvement of CBG to 400's. Encouraged adequate hydration today.   Plan: Medications:  Sent new Rx for insulin pens to [facility] Department for 75/25; 50 units in the Am, 70 units in the PM. Given sample vials of 70/30 in the clinic for now (about 2-3 weeks worth). Will also start Victoza 0.6 mg once daily injection for now, will increase to 1.2 in 1 week. This will also be covered by MAP when he is able to get his meds. Thank you Mannie Stabile for assistance with this.  Home glucose monitoring: Frequency:  bid-tid Instruction/counseling given: reminded to bring blood glucose meter & log to each visit, reminded to bring medications to each visit, discussed foot care, discussed the need for weight loss and discussed diet Other plans: RTC in 1-2 weeks for follow up regarding his medications.

## 2015-11-23 NOTE — Progress Notes (Signed)
Subjective:   Patient ID: Gary [name] male   DOB: 11/27/1955 60 y.o.   MRN: [id number]  HPI: Mr. Gary [name] is a 60 y.o. male w/ PMHx of HTN, DM type II, HLD, GERD, and CKD stage 3, presents to the clinic today for a follow-up visit for his DM type II. Patient has been having a difficult time affording his insulin recently and has had to stretch out his dosing. Is supposed to take insulin 70/30, 50 units in the AM, 70 units in the PM, but he has been using significantly less in order to make his pens last. Today his CBG is in the 500's. He is asymptomatic, no vision changes or polyuria. Appetite is stable. Does not take any other medications for his DM type II including Metformin, 2/2 his CKD.   Current Outpatient Prescriptions  Medication Sig Dispense Refill  . acetaminophen (TYLENOL) 500 MG tablet Take 500 mg by mouth [name] 6 (six) hours as needed.      Marland Kitchen amLODipine (NORVASC) 10 MG tablet TAKE ONE TABLET BY MOUTH ONCE DAILY 90 tablet 1  . aspirin 81 MG tablet Take 81 mg by mouth daily.      Marland Kitchen atorvastatin (LIPITOR) 40 MG tablet Take 1 tablet (40 mg total) by mouth daily. 30 tablet 5  . Blood Glucose Monitoring Suppl (ONE TOUCH ULTRA MINI) W/DEVICE KIT Use to check blood sugars during the day at work before and after lunch     . carvedilol (COREG) 25 MG tablet TAKE ONE TABLET BY MOUTH TWICE DAILY WITH MEALS 60 tablet 2  . furosemide (LASIX) 80 MG tablet TAKE ONE TABLET BY MOUTH ONCE DAILY 30 tablet 5  . Glucosamine-Chondroitin (OSTEO BI-FLEX REGULAR STRENGTH) 250-200 MG TABS Take 4 caplets per day with food     . glucose blood (ONE TOUCH ULTRA TEST) test strip 1 each by Other route as needed for other. Use 3 to 4 times daily to check blood sugar. diag code 250.72. Insulin dependent 100 each 5  . Insulin Lispro Prot & Lispro (HUMALOG MIX 75/25 KWIKPEN) (75-25) 100 UNIT/ML Kwikpen Inject into the skin twice daily (50 units with breakfast and 70 units with dinner).340B Steve 15 mL 5  .  Insulin Pen Needle 31G X 6 MM MISC Twice a day. 100 each 3  . Lancets (ONETOUCH ULTRASOFT) lancets Use to test your blood sugar before each meal each day     . Liraglutide (VICTOZA) 18 MG/3ML SOPN Inject 0.1 mLs (0.6 mg total) into the skin daily. After 1 week, increase dose to 1.2 mg daily. 6 mL 5  . lisinopril (PRINIVIL,ZESTRIL) 20 MG tablet TAKE ONE & ONE-HALF TABLETS BY MOUTH ONCE DAILY 45 tablet 5  . Omega-3 Fatty Acids (FISH OIL) 1200 MG CAPS Take 2 capsules by mouth daily.      Marland Kitchen RELION PEN NEEDLE 31G/8MM 31G X 8 MM MISC USE AS DIRECTED TWICE DAILY (Patient not taking: Reported on 05/24/2014) 100 each 3  . Vitamin D, Ergocalciferol, (DRISDOL) 50000 UNITS CAPS      No current facility-administered medications for this visit.   Review of Systems  General: Denies fever, diaphoresis, appetite change, and fatigue.  Respiratory: Denies SOB, cough, and wheezing.   Cardiovascular: Denies chest pain and palpitations.  Gastrointestinal: Denies nausea, vomiting, abdominal pain, and diarrhea Musculoskeletal: Positive for leg swelling. Denies myalgias, arthralgias, back pain, and gait problem.  Neurological: Denies dizziness, syncope, weakness, lightheadedness, and headaches.  Psychiatric/Behavioral: Denies mood changes, sleep disturbance, and  agitation.  Objective:   Physical Exam: Filed Vitals:   11/22/15 1418  BP: 125/60  Pulse: 75  Temp: 98.3 F (36.8 C)  TempSrc: Oral  Height: 5' 11" (1.803 m)  Weight: 296 lb 8 oz (134.492 kg)  SpO2: 99%   General: Obese AA male, alert, cooperative, NAD. HEENT: PERRL, EOMI. Moist mucus membranes Neck: Full range of motion without pain, supple, no lymphadenopathy or carotid bruits Lungs: Air entry equal bilaterally, faint bibasilar rales. No wheezes or rhonchi.  Heart: RRR, no murmurs, gallops, or rubs Abdomen: Soft, non-tender, non-distended, BS + Extremities: No cyanosis, clubbing. +2 pitting edema. Venous stasis changes.  Neurologic: Alert &  oriented X3, cranial nerves II-XII intact, strength grossly intact, sensation intact to light touch   Assessment & Plan:   Please see problem based assessment and plan.

## 2015-11-24 MED ORDER — GLUCOSE BLOOD VI STRP
1.0000 | ORAL_STRIP | Status: DC | PRN
Start: 2015-11-24 — End: 2016-10-09

## 2015-11-24 MED ORDER — ONETOUCH ULTRA MINI W/DEVICE KIT: 1.0000 | PACK | Freq: Three times a day (TID) | Status: AC

## 2015-11-24 MED ORDER — ONETOUCH ULTRASOFT LANCETS MISC: Status: DC

## 2015-11-24 MED ORDER — INSULIN PEN NEEDLE 31G X 6 MM MISC: 1.0000 | Freq: Three times a day (TID) | Status: DC

## 2015-11-24 NOTE — Addendum Note (Signed)
Addended by: Forde Dandy on: 11/24/2015 04:21 PM   Modules accepted: Orders

## 2015-11-25 NOTE — Progress Notes (Signed)
Internal Medicine Clinic Attending  Case discussed with Dr. Jones at the time of the visit.  We reviewed the resident's history and exam and pertinent patient test results.  I agree with the assessment, diagnosis, and plan of care documented in the resident's note.  

## 2015-12-02 ENCOUNTER — Ambulatory Visit: Payer: Self-pay | Admitting: Internal Medicine

## 2015-12-14 ENCOUNTER — Ambulatory Visit: Payer: Self-pay | Admitting: Pharmacist

## 2015-12-14 DIAGNOSIS — Z79899 Other long term (current) drug therapy: Secondary | ICD-10-CM

## 2015-12-14 NOTE — Progress Notes (Signed)
Medication Samples have been provided to the patient.  Drug: Victoza pen Strength: 18mg /23ml Qty: 1 LOT: [id number] Exp.Date: 05-2017 Dosing instructions: Inject 0.6mg  into the skin once daily. After one week increase dose to 1.2mg  daily.  The patient has been instructed regarding the correct time, dose, and frequency of taking this medication, including desired effects and most common side effects.   Samples signed for by Dr. Maudie Mercury.  [name] 12:57 PM 12/14/2015

## 2015-12-19 ENCOUNTER — Other Ambulatory Visit: Payer: Self-pay | Admitting: Pharmacist

## 2015-12-19 DIAGNOSIS — I10 Essential (primary) hypertension: Secondary | ICD-10-CM

## 2015-12-19 DIAGNOSIS — E785 Hyperlipidemia, unspecified: Secondary | ICD-10-CM

## 2015-12-19 MED ORDER — ATORVASTATIN CALCIUM 40 MG PO TABS: 40.0000 mg | ORAL_TABLET | Freq: Every day | ORAL | Status: DC

## 2015-12-19 MED ORDER — LISINOPRIL 20 MG PO TABS: 30.0000 mg | ORAL_TABLET | Freq: Every day | ORAL | Status: DC

## 2015-12-19 MED ORDER — FUROSEMIDE 80 MG PO TABS: 80.0000 mg | ORAL_TABLET | Freq: Every day | ORAL | Status: DC

## 2015-12-19 MED ORDER — AMLODIPINE BESYLATE 10 MG PO TABS: 10.0000 mg | ORAL_TABLET | Freq: Every day | ORAL | Status: DC

## 2015-12-19 MED ORDER — CARVEDILOL 25 MG PO TABS: 25.0000 mg | ORAL_TABLET | Freq: Two times a day (BID) | ORAL | Status: DC

## 2015-12-19 MED ORDER — ASPIRIN 81 MG PO TABS: 81.0000 mg | ORAL_TABLET | Freq: Every day | ORAL | Status: DC

## 2015-12-19 NOTE — Progress Notes (Signed)
Patient was contacted  by Juanell Fairly, PharmD candidate. I agree with the assessment and plan of care documented.

## 2015-12-19 NOTE — Progress Notes (Signed)
Patient enrolled in [place] by pharmacy student and prescriptions transferred.

## 2015-12-26 ENCOUNTER — Other Ambulatory Visit: Payer: Self-pay | Admitting: Internal Medicine

## 2015-12-26 DIAGNOSIS — E785 Hyperlipidemia, unspecified: Secondary | ICD-10-CM

## 2016-03-09 LAB — HM DIABETES EYE EXAM

## 2016-03-11 NOTE — Assessment & Plan Note (Addendum)
Lab Results  Component Value Date   HGBA1C 10.1 03/12/2016   HGBA1C >14.0 11/22/2015   HGBA1C 9.0 08/16/2015   At last visit had been unable to afford insulin and was nonadherent and started on Victoza 1.2 mg daily on 11/2015.  For last 1-1.5 months has been getting insulin from health department and taking Humalog 75/25 60U AM and 5U PM regularly.   Checks BGs at home AM and PM.  Occasional asymptomatic hypoglycemic readings in 60s.  Assessment HgbA1c goal: 7.0 Blood Sugar control: improving  Plan Medications: continue current meds.  Will likely need to increase insulin in the future.  Will plan to discontinued beta blocker due to asymptomatic hypoglycemic events. Other: Due for eye exam, has appointment scheduled with [facility].  Reinforced teaching regarding diet and weight loss.  Bring glucometer to next appointment in 3 months.

## 2016-03-11 NOTE — Assessment & Plan Note (Addendum)
Established with [facility], but hasn't followed recently due to lack of insurance.  02/2015 (with [facility]) Cr 2.46 K 5.7 Phos 4.7  Hgb 11.3  On Lisinopril 20 mg daily  -BMP and CBC today -Discontinue amlodipine, begin diltiazem for renal protective benefit

## 2016-03-11 NOTE — Assessment & Plan Note (Addendum)
BP Readings from Last 3 Encounters:  03/12/16 (!) 149/67  11/22/15 125/60  10/18/15 127/73   Lab Results  Component Value Date   CREATININE 1.84 (H) 08/19/2015   Lab Results  Component Value Date   K 4.6 08/19/2015   On amlodipine 10 mg daily, lisinopril 20 mg daily, carvedilol 25 mg daily, and lasix 80 mg daily.  Assessment BP goal: 140/90 BP control: slightly elevated today, has been well controlled  Plan Medications: discontinue amlodipine, start diltiazem 120 mg daily for renal protection in diabetes Other: discussed exercise and weight loss.  F/u 1 month for BP and titration of diltiazem

## 2016-03-11 NOTE — Progress Notes (Signed)
   CC: arthritis  HPI:  [name] is a 60 y.o. man with history of DM2, HTN, HL, and CKD 3 who presents for 3 month follow-up for management of his diabetes.  Please see A&P for status of his chronic medical conditions.   Past Medical History:  Diagnosis Date  . Cardiomyopathy    Resolved with EF 55% 2011  . CARDIOMYOPATHY 04/22/2006  . Diabetes mellitus   . DIABETIC  RETINOPATHY 02/06/2007  . GERD (gastroesophageal reflux disease)   . Hyperlipemia   . Hypertension   . NEPHROPATHY, DIABETIC 06/08/2006  . Paroxysmal atrial tachycardia (HCC)     Review of Systems:   Review of Systems  Constitutional: Negative for chills and fever.  Respiratory: Negative for cough, shortness of breath and wheezing.   Cardiovascular: Positive for leg swelling. Negative for chest pain, palpitations, orthopnea and PND.  Musculoskeletal: Positive for joint pain. Negative for myalgias.  Neurological: Negative for headaches.     Physical Exam:  Vitals:   03/12/16 1525  BP: (!) 149/67  Pulse: 74  Temp: 98 F (36.7 C)  TempSrc: Oral  SpO2: 99%  Weight: (!) 302 lb 8 oz (137.2 kg)  Height: 5\' 11"  (1.803 m)     Physical Exam  Constitutional: He is oriented to person, place, and time.  Obese, in no distress  HENT:  Head: Normocephalic and atraumatic.  Eyes: Conjunctivae are normal. No scleral icterus.  Cardiovascular: Normal rate, regular rhythm and normal heart sounds.   Pulmonary/Chest: Effort normal and breath sounds normal.  Musculoskeletal:  Asymmetric edema of LEs R > L. Right is grossly edematous, taut, indurated to knee. Left with 2+ pitting edema to mid shin.  Neurological: He is alert and oriented to person, place, and time.  Skin:  R lower leg with extensive discoloration consistent with venous stasis  Psychiatric: He has a normal mood and affect. His behavior is normal.    Assessment & Plan:   See Encounters Tab for problem based charting.  Patient seen with Dr.  Lynnae January

## 2016-03-12 ENCOUNTER — Telehealth: Payer: Self-pay | Admitting: Pharmacist

## 2016-03-12 ENCOUNTER — Encounter: Payer: Self-pay | Admitting: Internal Medicine

## 2016-03-12 ENCOUNTER — Ambulatory Visit (INDEPENDENT_AMBULATORY_CARE_PROVIDER_SITE_OTHER): Payer: Self-pay | Admitting: Internal Medicine

## 2016-03-12 VITALS — BP 149/67 | HR 74 | Temp 98.0°F | Ht 71.0 in | Wt 302.5 lb

## 2016-03-12 DIAGNOSIS — M170 Bilateral primary osteoarthritis of knee: Secondary | ICD-10-CM

## 2016-03-12 DIAGNOSIS — E118 Type 2 diabetes mellitus with unspecified complications: Principal | ICD-10-CM

## 2016-03-12 DIAGNOSIS — Z794 Long term (current) use of insulin: Secondary | ICD-10-CM

## 2016-03-12 DIAGNOSIS — E1122 Type 2 diabetes mellitus with diabetic chronic kidney disease: Secondary | ICD-10-CM

## 2016-03-12 DIAGNOSIS — I10 Essential (primary) hypertension: Secondary | ICD-10-CM

## 2016-03-12 DIAGNOSIS — M1990 Unspecified osteoarthritis, unspecified site: Secondary | ICD-10-CM

## 2016-03-12 DIAGNOSIS — M19071 Primary osteoarthritis, right ankle and foot: Secondary | ICD-10-CM

## 2016-03-12 DIAGNOSIS — Z79899 Other long term (current) drug therapy: Secondary | ICD-10-CM

## 2016-03-12 DIAGNOSIS — R600 Localized edema: Secondary | ICD-10-CM

## 2016-03-12 DIAGNOSIS — Z597 Insufficient social insurance and welfare support: Secondary | ICD-10-CM

## 2016-03-12 DIAGNOSIS — I129 Hypertensive chronic kidney disease with stage 1 through stage 4 chronic kidney disease, or unspecified chronic kidney disease: Secondary | ICD-10-CM

## 2016-03-12 DIAGNOSIS — M7989 Other specified soft tissue disorders: Secondary | ICD-10-CM

## 2016-03-12 DIAGNOSIS — IMO0002 Reserved for concepts with insufficient information to code with codable children: Secondary | ICD-10-CM

## 2016-03-12 DIAGNOSIS — N183 Chronic kidney disease, stage 3 unspecified: Secondary | ICD-10-CM

## 2016-03-12 DIAGNOSIS — E11649 Type 2 diabetes mellitus with hypoglycemia without coma: Secondary | ICD-10-CM

## 2016-03-12 DIAGNOSIS — E1165 Type 2 diabetes mellitus with hyperglycemia: Secondary | ICD-10-CM

## 2016-03-12 LAB — GLUCOSE, CAPILLARY: GLUCOSE-CAPILLARY: 159 mg/dL — AB (ref 65–99)

## 2016-03-12 LAB — POCT GLYCOSYLATED HEMOGLOBIN (HGB A1C): Hemoglobin A1C: 10.1

## 2016-03-12 MED ORDER — DILTIAZEM HCL ER COATED BEADS 120 MG PO CP24: 120.0000 mg | ORAL_CAPSULE | Freq: Every day | ORAL | 3 refills | Status: DC

## 2016-03-12 MED FILL — CARTIA XT 120 MG CAPSULE SA: 120 | 30 days supply | Qty: 30 | Fill #0

## 2016-03-12 NOTE — Assessment & Plan Note (Addendum)
Chronic pain right ankle and bilateral knees.  Remote R ankle injury.  Takes tylenol and glucosamine supplements. -Avoid NSAIDs -Continue tylenol and glucosamine

## 2016-03-12 NOTE — Progress Notes (Signed)
Resubmitted prescription for diltiazem under IM program at [facility] outpatient pharmacy

## 2016-03-12 NOTE — Patient Instructions (Addendum)
We talked about your diabetes, high blood pressure, kidney disease and arthritis today.  For your diabetes, continue taking your insulin and victoza regularly.  Your A1c is getting better, but is still not good.  Check your blood sugars twice a day at home and bring your meter to your next appointment.  We'll check another A1c in 3 months and then decide if we need to make changes to your insulin.  For you high blood pressure and kidney disease, please STOP taking AMLODIPINE and START taking DILTIAZEM.  Review your medication list and take all medicines as prescribed.  If you start having muscle pain or cramping, please call the clinic.  For your arthritis, you can continue taking tylenol as needed.  Avoid NSAIDs like ibuprofen, naproxen, Motrin, Advil, Aleve etc for pain, as they can hurt your kidneys.  Please schedule an appointment for 3 months to follow-up on these issues.

## 2016-03-12 NOTE — Assessment & Plan Note (Addendum)
Bilateral but asymmetric LE edema R > L.  Recent DVT US (08/2015) negative for DVT, no posterior calf pain or shortness of breath to support thromboembolic disease.  Reports remote history of R ankle injury (severe sprain requiring immobilization), but no significant trauma or surgery to explain asymmetry.  CCB likely contributing to edema. -Continue lasix 80 mg

## 2016-03-13 ENCOUNTER — Encounter: Payer: Self-pay | Admitting: Internal Medicine

## 2016-03-13 LAB — CBC
HEMATOCRIT: 35.1 % — AB (ref 37.5–51.0)
Hemoglobin: 11.3 g/dL — ABNORMAL LOW (ref 12.6–17.7)
MCH: 27.8 pg (ref 26.6–33.0)
MCHC: 32.2 g/dL (ref 31.5–35.7)
MCV: 87 fL (ref 79–97)
PLATELETS: 239 10*3/uL (ref 150–379)
RBC: 4.06 x10E6/uL — ABNORMAL LOW (ref 4.14–5.80)
RDW: 13.9 % (ref 12.3–15.4)
WBC: 8.2 10*3/uL (ref 3.4–10.8)

## 2016-03-13 LAB — BMP8+ANION GAP
ANION GAP: 23 mmol/L — AB (ref 10.0–18.0)
BUN/Creatinine Ratio: 21 (ref 10–24)
BUN: 33 mg/dL — ABNORMAL HIGH (ref 8–27)
CALCIUM: 9.3 mg/dL (ref 8.6–10.2)
CO2: 21 mmol/L (ref 18–29)
CREATININE: 1.58 mg/dL — AB (ref 0.76–1.27)
Chloride: 101 mmol/L (ref 96–106)
GFR, EST AFRICAN AMERICAN: 53 mL/min/{1.73_m2} — AB (ref >59)
GFR, EST NON AFRICAN AMERICAN: 46 mL/min/{1.73_m2} — AB (ref >59)
Glucose: 161 mg/dL — ABNORMAL HIGH (ref 65–99)
POTASSIUM: 4 mmol/L (ref 3.5–5.2)
Sodium: 145 mmol/L — ABNORMAL HIGH (ref 134–144)

## 2016-03-13 NOTE — Progress Notes (Signed)
Internal Medicine Clinic Attending  I saw and evaluated the patient.  I personally confirmed the key portions of the history and exam documented by Dr. O'Sullivan and I reviewed pertinent patient test results.  The assessment, diagnosis, and plan were formulated together and I agree with the documentation in the resident's note.   

## 2016-03-29 ENCOUNTER — Other Ambulatory Visit: Payer: Self-pay | Admitting: *Deleted

## 2016-03-29 DIAGNOSIS — Z794 Long term (current) use of insulin: Principal | ICD-10-CM

## 2016-03-29 DIAGNOSIS — IMO0002 Reserved for concepts with insufficient information to code with codable children: Secondary | ICD-10-CM

## 2016-03-29 DIAGNOSIS — E118 Type 2 diabetes mellitus with unspecified complications: Principal | ICD-10-CM

## 2016-03-29 DIAGNOSIS — E1165 Type 2 diabetes mellitus with hyperglycemia: Secondary | ICD-10-CM

## 2016-03-30 MED ORDER — INSULIN LISPRO PROT & LISPRO (75-25 MIX) 100 UNIT/ML KWIKPEN: PEN_INJECTOR | SUBCUTANEOUS | 5 refills | Status: DC

## 2016-04-29 NOTE — Assessment & Plan Note (Deleted)
BP Readings from Last 3 Encounters:  03/12/16 (!) 149/67  11/22/15 125/60  10/18/15 127/73   Lab Results  Component Value Date   CREATININE 1.58 (H) 03/12/2016   Lab Results  Component Value Date   K 4.0 03/12/2016   On diltiazem 120 mg daily, lisinopril 20 mg daily, carvedilol 25 mg daily, and lasix 80 mg daily.  Assessment BP goal: 140/90 BP control:   Plan Medications:  Other: discussed exercise and weight loss.

## 2016-04-29 NOTE — Progress Notes (Deleted)
   CC: arthritis  HPI:  [name] is a 60 y.o. man with history of DM2, HTN, HL, and CKD 3 who presents for management of his diabetes.  Please see A&P for status of his chronic medical conditions.   Past Medical History:  Diagnosis Date  . Cardiomyopathy    Resolved with EF 55% 2011  . CARDIOMYOPATHY 04/22/2006  . Diabetes mellitus   . DIABETIC  RETINOPATHY 02/06/2007  . GERD (gastroesophageal reflux disease)   . Hyperlipemia   . Hypertension   . NEPHROPATHY, DIABETIC 06/08/2006  . Paroxysmal atrial tachycardia ([place])     Review of Systems:   ROS   Physical Exam:  There were no vitals filed for this visit.   Physical Exam  Assessment & Plan:   See Encounters Tab for problem based charting.  Patient seen with Dr. Marland Kitchen

## 2016-04-29 NOTE — Assessment & Plan Note (Deleted)
Lab Results  Component Value Date   HGBA1C 10.1 03/12/2016   HGBA1C >14.0 11/22/2015   HGBA1C 9.0 08/16/2015    No results for input(s): GLUCAP in the last 72 hours.   Current medications: liraglutide 1.2 mg daily  Current insulin: Humalog 75/25 50U qAM 70U qPM  Assessment HgbA1c goal:  <7.0 Glycemic control: uncontrolled Complications:  Plan Medications:  Insulin: Other:

## 2016-04-30 ENCOUNTER — Encounter: Payer: Self-pay | Admitting: Internal Medicine

## 2016-05-02 ENCOUNTER — Telehealth: Payer: Self-pay

## 2016-05-04 MED FILL — CARTIA XT 120 MG CAPSULE SA: 120 | 30 days supply | Qty: 30 | Fill #0

## 2016-05-04 NOTE — Progress Notes (Signed)
[name] is a 60 y.o. male who was contacted on behalf of [facility] Geriatrics Task Force. Unable to reach patient, left message. Pharmacy refill records show consistent fill history.

## 2016-05-22 IMAGING — DX DG ELBOW COMPLETE 3+V*R*
4 series · 4 of 4 positions shown · non-contrast
Comparison: None

CLINICAL DATA: Right elbow pain for 3 days with history of an old
fracture. ; no known injury

EXAM:
RIGHT ELBOW - COMPLETE 3+ VIEW

[elbow obl (1 of 2)]
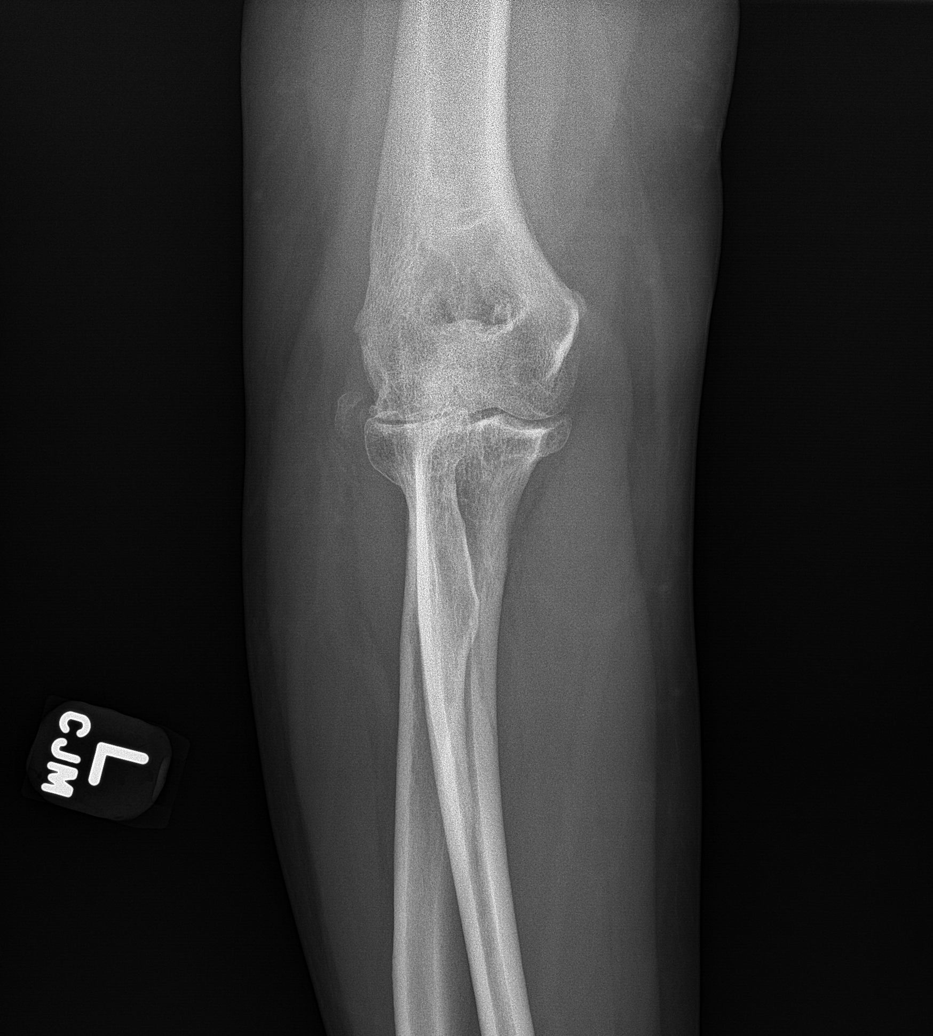

[elbow obl (2 of 2)]
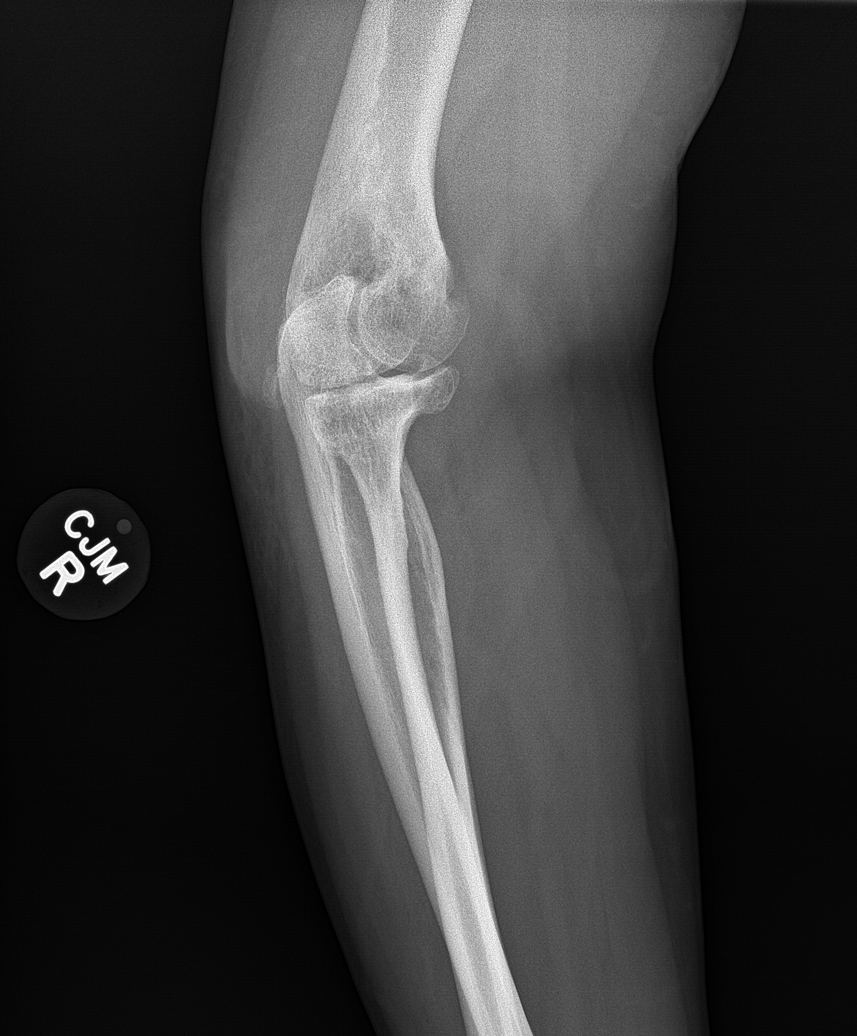

[elbow lat (1 of 2)]
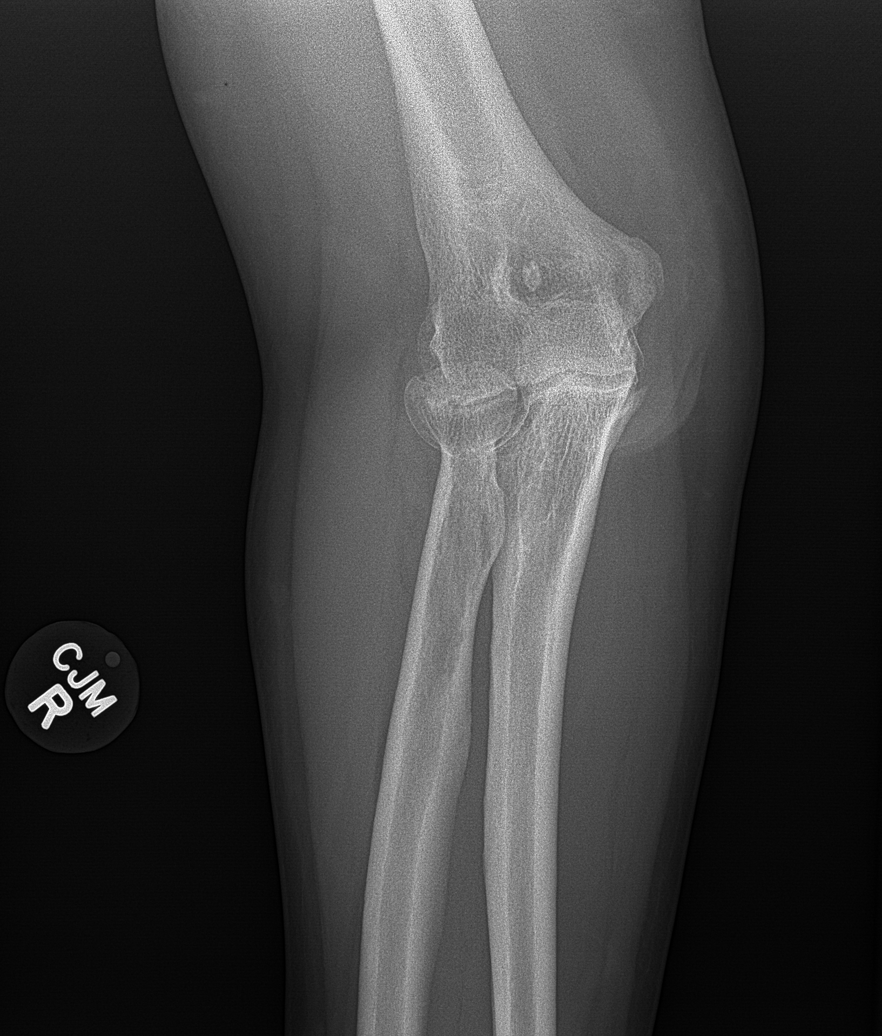

[elbow lat (2 of 2)]
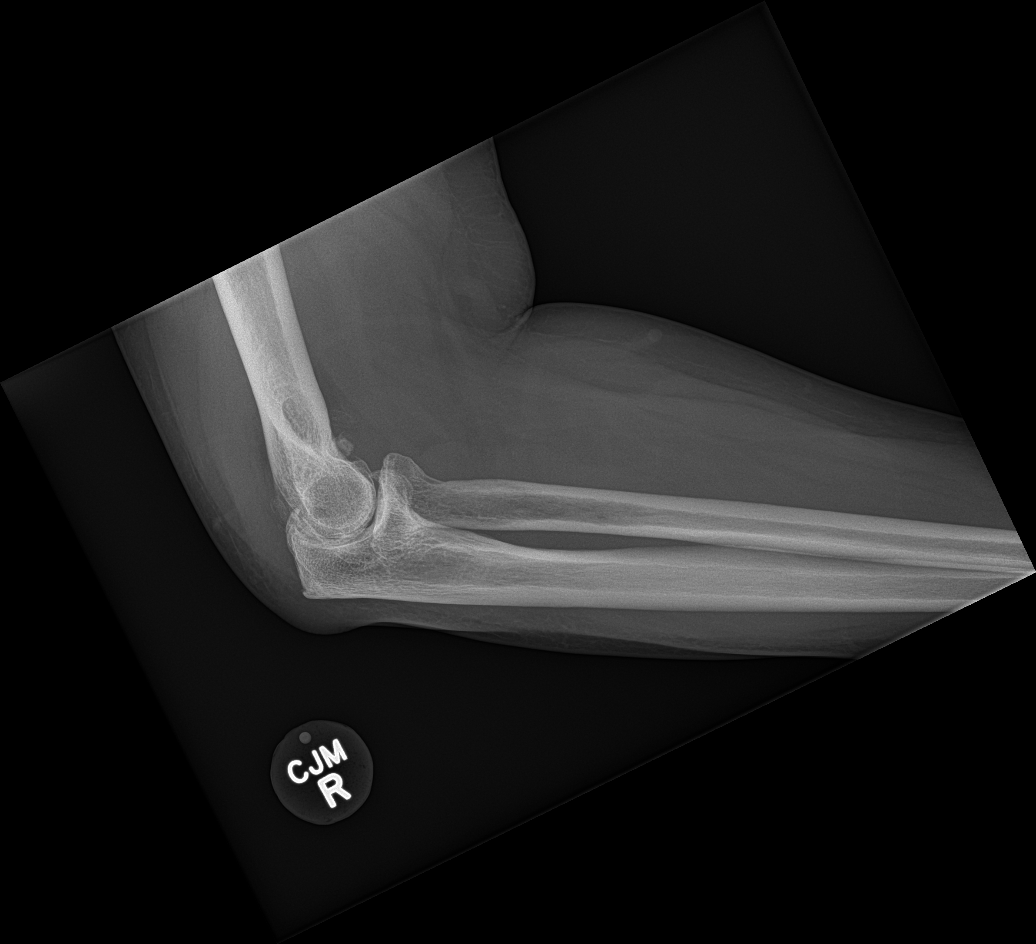

[4 of 4 positions shown; findings below may reference images not displayed]

FINDINGS: There is chronic deformity of the coronoid process of the olecranon
most compatible with degenerative change. An old impacted radial
head fracture is present. There is soft tissue calcification along
the lateral aspect of the radioulnar articulation. There is is
posterior fat pad sign. There is soft tissue swelling over the
olecranon. There is a tiny olecranon spur.
IMPRESSION: There are degenerative changes of the elbow. No acute fracture is
evident. There is a small joint effusion and there is likely
olecranon bursitis.

## 2016-06-12 ENCOUNTER — Other Ambulatory Visit: Payer: Self-pay | Admitting: *Deleted

## 2016-06-12 DIAGNOSIS — Z794 Long term (current) use of insulin: Principal | ICD-10-CM

## 2016-06-12 DIAGNOSIS — E118 Type 2 diabetes mellitus with unspecified complications: Principal | ICD-10-CM

## 2016-06-12 DIAGNOSIS — E1165 Type 2 diabetes mellitus with hyperglycemia: Secondary | ICD-10-CM

## 2016-06-12 DIAGNOSIS — IMO0002 Reserved for concepts with insufficient information to code with codable children: Secondary | ICD-10-CM

## 2016-06-12 MED ORDER — INSULIN LISPRO PROT & LISPRO (75-25 MIX) 100 UNIT/ML KWIKPEN: PEN_INJECTOR | SUBCUTANEOUS | 3 refills | Status: DC

## 2016-06-26 ENCOUNTER — Other Ambulatory Visit: Payer: Self-pay | Admitting: *Deleted

## 2016-06-26 DIAGNOSIS — E118 Type 2 diabetes mellitus with unspecified complications: Principal | ICD-10-CM

## 2016-06-26 DIAGNOSIS — IMO0002 Reserved for concepts with insufficient information to code with codable children: Secondary | ICD-10-CM

## 2016-06-26 DIAGNOSIS — Z794 Long term (current) use of insulin: Principal | ICD-10-CM

## 2016-06-26 DIAGNOSIS — E1165 Type 2 diabetes mellitus with hyperglycemia: Secondary | ICD-10-CM

## 2016-06-26 MED ORDER — LIRAGLUTIDE 18 MG/3ML ~~LOC~~ SOPN: 1.2000 mg | PEN_INJECTOR | Freq: Every day | SUBCUTANEOUS | 3 refills | Status: DC

## 2016-07-20 ENCOUNTER — Other Ambulatory Visit: Payer: Self-pay | Admitting: Pharmacist

## 2016-07-20 DIAGNOSIS — I10 Essential (primary) hypertension: Secondary | ICD-10-CM

## 2016-07-20 DIAGNOSIS — Z794 Long term (current) use of insulin: Secondary | ICD-10-CM

## 2016-07-20 DIAGNOSIS — E1165 Type 2 diabetes mellitus with hyperglycemia: Secondary | ICD-10-CM

## 2016-07-20 DIAGNOSIS — E118 Type 2 diabetes mellitus with unspecified complications: Secondary | ICD-10-CM

## 2016-07-20 DIAGNOSIS — E785 Hyperlipidemia, unspecified: Secondary | ICD-10-CM

## 2016-07-20 DIAGNOSIS — IMO0002 Reserved for concepts with insufficient information to code with codable children: Secondary | ICD-10-CM

## 2016-07-21 MED ORDER — ATORVASTATIN CALCIUM 40 MG PO TABS: 40.0000 mg | ORAL_TABLET | Freq: Every day | ORAL | 3 refills | Status: DC

## 2016-07-21 MED ORDER — CARVEDILOL 25 MG PO TABS: 25.0000 mg | ORAL_TABLET | Freq: Two times a day (BID) | ORAL | 3 refills | Status: DC

## 2016-07-21 MED ORDER — DILTIAZEM HCL ER COATED BEADS 120 MG PO CP24: 120.0000 mg | ORAL_CAPSULE | Freq: Every day | ORAL | 3 refills | Status: DC

## 2016-07-21 MED ORDER — FUROSEMIDE 80 MG PO TABS: 80.0000 mg | ORAL_TABLET | Freq: Every day | ORAL | 0 refills | Status: DC

## 2016-07-21 MED ORDER — LISINOPRIL 20 MG PO TABS: 30.0000 mg | ORAL_TABLET | Freq: Every day | ORAL | 3 refills | Status: DC

## 2016-07-23 MED FILL — CARTIA XT 120 MG CAPSULE SA: 120 | 30 days supply | Qty: 30 | Fill #0

## 2016-09-03 ENCOUNTER — Telehealth: Payer: Self-pay | Admitting: Internal Medicine

## 2016-09-03 NOTE — Progress Notes (Signed)
   CC: "I want to see how my sugars are doing."  HPI:  [name] is a 61 y.o. man with history of DM2, HTN, HL, and CKD 3 who presents for management of his diabetes.  Please see A&P for status of his chronic medical conditions.   Past Medical History:  Diagnosis Date  . Cardiomyopathy    Resolved with EF 55% 2011  . CARDIOMYOPATHY 04/22/2006  . Diabetes mellitus   . DIABETIC  RETINOPATHY 02/06/2007  . GERD (gastroesophageal reflux disease)   . Hyperlipemia   . Hypertension   . NEPHROPATHY, DIABETIC 06/08/2006  . Paroxysmal atrial tachycardia ([facility])     Review of Systems:   Review of Systems  Constitutional: Negative for chills and fever.  Respiratory: Negative for cough and shortness of breath.   Cardiovascular: Negative for chest pain and palpitations.  Gastrointestinal: Negative for constipation, diarrhea, nausea and vomiting.     Physical Exam:  Vitals:   09/04/16 1344 09/04/16 1347  BP: (!) 179/80 (!) 183/84  Pulse: 89 84  Temp: 98 F (36.7 C)   TempSrc: Oral   SpO2: 100%   Weight: (!) 311 lb 11.2 oz (141.4 kg)     Body mass index is 43.47 kg/m.   Physical Exam  Constitutional: He is oriented to person, place, and time.  Obese man in no distress  Cardiovascular:  Regular rate and rhythm 2/6 systolic murmur  Pulmonary/Chest: Effort normal and breath sounds normal.  Musculoskeletal: He exhibits no edema or tenderness.  Neurological: He is alert and oriented to person, place, and time.  Psychiatric: He has a normal mood and affect. His behavior is normal.    Assessment & Plan:   See Encounters Tab for problem based charting.  Patient discussed with Dr. Eppie Gibson

## 2016-09-03 NOTE — Telephone Encounter (Signed)
APT. REMINDER CALL, LMTCB °

## 2016-09-03 NOTE — Assessment & Plan Note (Addendum)
Check HIV, HCV antibody per USPSTF screening recommendation

## 2016-09-03 NOTE — Assessment & Plan Note (Addendum)
BP Readings from Last 3 Encounters:  09/04/16 (!) 183/84  03/12/16 (!) 149/67  11/22/15 125/60   Lab Results  Component Value Date   CREATININE 1.62 (H) 09/04/2016   Lab Results  Component Value Date   K 4.8 09/04/2016   Current medicaitons: lisinopril 30 mg daily, diltiazem 120 mg daily, carvedilol 25 mg BID, and lasix 80 mg daily.  Ran out of diltiazem and did not take today.  Assessment BP goal: 130/80 BP control: uncontrolled  Elevated BP today with medication nonadherance.  Plan Medications: continue current meds -will likely require uptitration of CCB and ACEI Other:  -discussed exercise and weight loss

## 2016-09-03 NOTE — Assessment & Plan Note (Addendum)
Lab Results  Component Value Date   HGBA1C 8.3 09/04/2016   HGBA1C 10.1 03/12/2016   HGBA1C >14.0 11/22/2015    Recent Labs  09/04/16 1347  GLUCAP 299*    Current medications: liraglutide 1.2 mg daily Current insulin: 75/25 60U AM and 50U PM  Has not been checking BG at home recently.  Recently got new meter and has been troubles using it.  He has been on metformin in the past and tolerated it well, was discontinued due to his CKD.  His GFR has been stable in 50s for last 2 years.  Assessment HgbA1c goal: <7.0 Glycemic control: Complications: retinopathy, nephropathy, peripheral neuropathy  Current recommendations suggest normal dosing of metformin with maximum of 2g daily with GFRs >45.  Plan Medications: continue liraglutide 1.2 mg daily, add metformin 500 mg BID Insulin: continue current insulin Other: -check A1c today -Referral to Diabetes educator for meter and BG checks -Titrate insulin when CBGs available -Ophtho follow-up 5/17 Dr Cordelia [facility] -Repeat BMP next visit

## 2016-09-04 ENCOUNTER — Encounter (INDEPENDENT_AMBULATORY_CARE_PROVIDER_SITE_OTHER): Payer: Self-pay

## 2016-09-04 ENCOUNTER — Ambulatory Visit (INDEPENDENT_AMBULATORY_CARE_PROVIDER_SITE_OTHER): Payer: BLUE CROSS/BLUE SHIELD | Admitting: Internal Medicine

## 2016-09-04 VITALS — BP 183/84 | HR 84 | Temp 98.0°F | Wt 311.7 lb

## 2016-09-04 DIAGNOSIS — Z79899 Other long term (current) drug therapy: Secondary | ICD-10-CM | POA: Diagnosis not present

## 2016-09-04 DIAGNOSIS — Z794 Long term (current) use of insulin: Secondary | ICD-10-CM

## 2016-09-04 DIAGNOSIS — E1142 Type 2 diabetes mellitus with diabetic polyneuropathy: Secondary | ICD-10-CM | POA: Diagnosis not present

## 2016-09-04 DIAGNOSIS — I10 Essential (primary) hypertension: Secondary | ICD-10-CM

## 2016-09-04 DIAGNOSIS — E11319 Type 2 diabetes mellitus with unspecified diabetic retinopathy without macular edema: Secondary | ICD-10-CM

## 2016-09-04 DIAGNOSIS — Z6841 Body Mass Index (BMI) 40.0 and over, adult: Secondary | ICD-10-CM | POA: Diagnosis not present

## 2016-09-04 DIAGNOSIS — I129 Hypertensive chronic kidney disease with stage 1 through stage 4 chronic kidney disease, or unspecified chronic kidney disease: Secondary | ICD-10-CM | POA: Diagnosis not present

## 2016-09-04 DIAGNOSIS — E118 Type 2 diabetes mellitus with unspecified complications: Secondary | ICD-10-CM

## 2016-09-04 DIAGNOSIS — Z1159 Encounter for screening for other viral diseases: Secondary | ICD-10-CM

## 2016-09-04 DIAGNOSIS — R011 Cardiac murmur, unspecified: Secondary | ICD-10-CM

## 2016-09-04 DIAGNOSIS — E785 Hyperlipidemia, unspecified: Secondary | ICD-10-CM | POA: Diagnosis not present

## 2016-09-04 DIAGNOSIS — E1122 Type 2 diabetes mellitus with diabetic chronic kidney disease: Secondary | ICD-10-CM

## 2016-09-04 DIAGNOSIS — Z114 Encounter for screening for human immunodeficiency virus [HIV]: Secondary | ICD-10-CM

## 2016-09-04 DIAGNOSIS — N183 Chronic kidney disease, stage 3 unspecified: Secondary | ICD-10-CM

## 2016-09-04 DIAGNOSIS — IMO0002 Reserved for concepts with insufficient information to code with codable children: Secondary | ICD-10-CM

## 2016-09-04 DIAGNOSIS — E1165 Type 2 diabetes mellitus with hyperglycemia: Secondary | ICD-10-CM | POA: Diagnosis not present

## 2016-09-04 DIAGNOSIS — Z0000 Encounter for general adult medical examination without abnormal findings: Secondary | ICD-10-CM

## 2016-09-04 LAB — POCT GLYCOSYLATED HEMOGLOBIN (HGB A1C): HEMOGLOBIN A1C: 8.3

## 2016-09-04 LAB — GLUCOSE, CAPILLARY: GLUCOSE-CAPILLARY: 299 mg/dL — AB (ref 65–99)

## 2016-09-04 MED ORDER — DILTIAZEM HCL ER COATED BEADS 120 MG PO CP24: 120.0000 mg | ORAL_CAPSULE | Freq: Every day | ORAL | 3 refills | Status: DC

## 2016-09-04 MED ORDER — METFORMIN HCL 500 MG PO TABS: 1000.0000 mg | ORAL_TABLET | Freq: Two times a day (BID) | ORAL | 11 refills | Status: DC

## 2016-09-04 MED FILL — CARTIA XT 120 MG CAPSULE SA: 120 | 90 days supply | Qty: 90 | Fill #0

## 2016-09-04 NOTE — Assessment & Plan Note (Addendum)
  Body mass index is 43.47 kg/m.  Does not get any regular exercise.  Plays with grandchildren.  Morbid obesity.  Current medications: liraglutide 1.2 mg daily  -continue current meds -discussed diet, exercise, and weight loss

## 2016-09-04 NOTE — Patient Instructions (Addendum)
You were see today for follow-up of diabetes and high blood pressure.  Your diabetes is doing better - good job!  I have sent in a prescription for metformin to [facility], and you can start taking 500 mg twice per day when you get it.  Keep using the Victoza and insulin as you are.  [name] will call you to schedule an appointment to talk about diet and your glucose meter.  Your blood pressure was very elevated today, at least in part because you ran out of the diltiazem.  I sent in refills to the [facility] - please pick it up ASAP.

## 2016-09-04 NOTE — Assessment & Plan Note (Addendum)
   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  Has not seen a nephrologist recently, but has seen Dr Lorrene Reid at [facility] previously.  Current medications: lisinopril 30 mg daily, diltiazem 120 mg daily  Stable CKD3. -continue current meds -Follow BMP for renal function at least Q28months

## 2016-09-05 LAB — BMP8+ANION GAP
Anion Gap: 16 mmol/L (ref 10.0–18.0)
BUN/Creatinine Ratio: 21 (ref 10–24)
BUN: 34 mg/dL — ABNORMAL HIGH (ref 8–27)
CO2: 23 mmol/L (ref 18–29)
CREATININE: 1.62 mg/dL — AB (ref 0.76–1.27)
Calcium: 9.8 mg/dL (ref 8.6–10.2)
Chloride: 105 mmol/L (ref 96–106)
GFR calc Af Amer: 51 mL/min/{1.73_m2} — ABNORMAL LOW (ref >59)
GFR calc non Af Amer: 45 mL/min/{1.73_m2} — ABNORMAL LOW (ref >59)
GLUCOSE: 262 mg/dL — AB (ref 65–99)
Potassium: 4.8 mmol/L (ref 3.5–5.2)
Sodium: 144 mmol/L (ref 134–144)

## 2016-09-05 LAB — HEPATITIS C ANTIBODY: Hep C Virus Ab: <0.1 s/co ratio (ref 0.0–0.9)

## 2016-09-05 LAB — LIPID PANEL
CHOLESTEROL TOTAL: 190 mg/dL (ref 100–199)
Chol/HDL Ratio: 4 ratio (ref 0.0–5.0)
HDL: 47 mg/dL (ref >39)
LDL Calculated: 105 mg/dL — ABNORMAL HIGH (ref 0–99)
TRIGLYCERIDES: 192 mg/dL — AB (ref 0–149)
VLDL CHOLESTEROL CAL: 38 mg/dL (ref 5–40)

## 2016-09-05 LAB — HIV ANTIBODY (ROUTINE TESTING W REFLEX): HIV Screen 4th Generation wRfx: NONREACTIVE

## 2016-09-05 MED ORDER — METFORMIN HCL 500 MG PO TABS: 500.0000 mg | ORAL_TABLET | Freq: Two times a day (BID) | ORAL | 11 refills | Status: DC

## 2016-09-05 NOTE — Progress Notes (Signed)
Case discussed with Dr. Inda Castle at the time of the visit. We reviewed the resident's history and exam and pertinent patient test results. I agree with the assessment, diagnosis, and plan of care documented in the resident's note.

## 2016-09-13 ENCOUNTER — Telehealth: Payer: Self-pay

## 2016-09-13 NOTE — Telephone Encounter (Signed)
Pt was called to ask if he picked up refills and to discuss home glucose monitoring. Pt was encouraged to follow up with Debera Lat about scheduling an appointment for diet and glucose meter.

## 2016-10-08 ENCOUNTER — Telehealth: Payer: Self-pay | Admitting: Internal Medicine

## 2016-10-08 NOTE — Telephone Encounter (Signed)
LMOM for Appt with DP on 10/09/2016 @ 10:15am

## 2016-10-09 ENCOUNTER — Encounter (INDEPENDENT_AMBULATORY_CARE_PROVIDER_SITE_OTHER): Payer: Self-pay

## 2016-10-09 ENCOUNTER — Ambulatory Visit (INDEPENDENT_AMBULATORY_CARE_PROVIDER_SITE_OTHER): Payer: BLUE CROSS/BLUE SHIELD | Admitting: Dietician

## 2016-10-09 ENCOUNTER — Other Ambulatory Visit: Payer: Self-pay | Admitting: Dietician

## 2016-10-09 ENCOUNTER — Encounter: Payer: Self-pay | Admitting: Dietician

## 2016-10-09 ENCOUNTER — Other Ambulatory Visit: Payer: Self-pay | Admitting: Internal Medicine

## 2016-10-09 DIAGNOSIS — Z6841 Body Mass Index (BMI) 40.0 and over, adult: Secondary | ICD-10-CM | POA: Diagnosis not present

## 2016-10-09 DIAGNOSIS — E118 Type 2 diabetes mellitus with unspecified complications: Principal | ICD-10-CM

## 2016-10-09 DIAGNOSIS — E1165 Type 2 diabetes mellitus with hyperglycemia: Secondary | ICD-10-CM

## 2016-10-09 DIAGNOSIS — Z713 Dietary counseling and surveillance: Secondary | ICD-10-CM | POA: Diagnosis not present

## 2016-10-09 DIAGNOSIS — Z794 Long term (current) use of insulin: Secondary | ICD-10-CM | POA: Diagnosis not present

## 2016-10-09 DIAGNOSIS — IMO0002 Reserved for concepts with insufficient information to code with codable children: Secondary | ICD-10-CM

## 2016-10-09 MED ORDER — GLUCOSE BLOOD VI STRP: ORAL_STRIP | 12 refills | Status: DC

## 2016-10-09 MED ORDER — INSULIN LISPRO PROT & LISPRO (75-25 MIX) 100 UNIT/ML KWIKPEN: PEN_INJECTOR | SUBCUTANEOUS | 3 refills | Status: DC

## 2016-10-09 MED ORDER — BAYER MICROLET LANCETS MISC: 12 refills | Status: DC

## 2016-10-09 MED ORDER — LIRAGLUTIDE 18 MG/3ML ~~LOC~~ SOPN: 1.8000 mg | PEN_INJECTOR | Freq: Every day | SUBCUTANEOUS | 3 refills | Status: DC

## 2016-10-09 NOTE — Patient Instructions (Addendum)
Bring your meter to your next visit.  I will call you about increasing your Victoza to 1.8mg /day to help with weight loss.

## 2016-10-09 NOTE — Progress Notes (Signed)
  Medical Nutrition Therapy:  Appt start time: 1000 end time:  1100. Visit # 1  Assessment:  Primary concerns today: weight loss, glycemic control, meter  [name] is here for a meter, help with weight loss and to improve his blood sugars. He was provided with a sample bayer [employer] Next One meter today and shown how to use it and upload the data to his smartphone if desired. He also wants help with weight loss verbalizing that this may help his blood sugars, his feet and legs and overall health. He is newly insured in the past few months and recently ran out of victoza as well. He had been struggling to afford his medicine and test strips. He feels now he can focus his energy on being active and changing his eating habits. He agrees to weekly or biweekly visits.  Preferred Learning Style: No preference indicated  Learning Readiness: Contemplating/Ready  ANTHROPOMETRICS: weight-313.8#, height-69.25", BMI-46 WEIGHT HISTORY: Highest:  current weight- 313#, Lowest-240s SLEEP:need to assess at future visit  MEDICATIONS: victoza- 1.8 mg/day, Humalog 75/25 60 units QAM, 50 units QPM, but has been taking 50 with breakfast and 70 with dinner, no reports of low blood sugars, metfmormin BLOOD SUGAR:ran out of strips between 4/20 and today, today fasting is 303, was 155-265 range mid April DIETARY INTAKE:plan to do at next visit as ran out of time today Dining Out (times/week): seldon 24-hr recall: not done today  Usual physical activity: adls- he is thnkling about doing more  Progress Towards Goal(s):  In progress.   Nutritional Diagnosis:  NB-1.4 Self-monitoring deficit As related to lack of material resouirces.  As evidenced by few meter readings. .    Intervention:  Nutrition education about meter options, switching pharmacies, action of victoza, used motivational interviewing to assist with behavior hange. Coordination of care: request increase in victoza and test strips  Teaching Method  Utilized: Visual, Auditory, Hands on Handouts given during visit include:AVS Barriers to learning/adherence to lifestyle change: lack of material resources Demonstrated degree of understanding via:  Teach Back   Monitoring/Evaluation:  Dietary intake, exercise, meter, and body weight in 2 week(s)  [name], [name], [place] 10/10/2016 5:52 PM. .

## 2016-10-09 NOTE — Telephone Encounter (Signed)
Almost out of insulin, has been out of Victoza for several days. Has not switched any of [facility] since getting his insurance.   Was given a Visual merchandiser Next One meter today here and needs prescriptions sent to [facility] for it.   Suggest moving all of his prescription to the same pharmacy with an increase in Victoza to 1.8mg /day to assist patient in desired weight loss. He agrees with this increase and agrees to check blood sugars more often when increase made.

## 2016-10-10 MED ORDER — LIRAGLUTIDE 18 MG/3ML ~~LOC~~ SOPN: 1.8000 mg | PEN_INJECTOR | Freq: Every day | SUBCUTANEOUS | 3 refills | Status: DC

## 2016-10-10 NOTE — Telephone Encounter (Signed)
Pharmacy requesting clarification on the directions of Victoza. "increase or decrease?" & dosage review. Sending to [place].

## 2016-10-10 NOTE — Telephone Encounter (Signed)
Clarified script.  Plan to increased to 1.8 mg daily.

## 2016-10-12 ENCOUNTER — Telehealth: Payer: Self-pay | Admitting: *Deleted

## 2016-10-12 MED ORDER — INSULIN ASPART PROT & ASPART (70-30 MIX) 100 UNIT/ML PEN: PEN_INJECTOR | SUBCUTANEOUS | 11 refills | Status: DC

## 2016-10-12 NOTE — Telephone Encounter (Signed)
Humalog Mix 75/25 Kwik Pen not covered by patient's insurance . Novlog Mix 70/30 Flex Pen is covered.  Message to Dr. Inda Castle to consider a change.  Sander Nephew, RN 10/12/2016 9:11 AM

## 2016-10-18 DIAGNOSIS — H4311 Vitreous hemorrhage, right eye: Secondary | ICD-10-CM | POA: Insufficient documentation

## 2016-10-18 DIAGNOSIS — Z9889 Other specified postprocedural states: Secondary | ICD-10-CM | POA: Diagnosis not present

## 2016-10-18 DIAGNOSIS — H3341 Traction detachment of retina, right eye: Secondary | ICD-10-CM | POA: Diagnosis not present

## 2016-10-18 DIAGNOSIS — H25813 Combined forms of age-related cataract, bilateral: Secondary | ICD-10-CM | POA: Insufficient documentation

## 2016-10-18 DIAGNOSIS — E113513 Type 2 diabetes mellitus with proliferative diabetic retinopathy with macular edema, bilateral: Secondary | ICD-10-CM | POA: Diagnosis not present

## 2016-10-18 LAB — HM DIABETES EYE EXAM

## 2016-10-22 ENCOUNTER — Encounter: Payer: Self-pay | Admitting: *Deleted

## 2016-10-23 ENCOUNTER — Ambulatory Visit (INDEPENDENT_AMBULATORY_CARE_PROVIDER_SITE_OTHER): Payer: BLUE CROSS/BLUE SHIELD | Admitting: Dietician

## 2016-10-23 DIAGNOSIS — Z713 Dietary counseling and surveillance: Secondary | ICD-10-CM

## 2016-10-23 DIAGNOSIS — Z794 Long term (current) use of insulin: Secondary | ICD-10-CM

## 2016-10-23 DIAGNOSIS — E119 Type 2 diabetes mellitus without complications: Secondary | ICD-10-CM | POA: Diagnosis not present

## 2016-10-23 MED ORDER — INSULIN ASPART PROT & ASPART (70-30 MIX) 100 UNIT/ML PEN: PEN_INJECTOR | SUBCUTANEOUS | 11 refills | Status: DC

## 2016-10-23 NOTE — Patient Instructions (Signed)
For next month- losing weight by doing the following...  My plan for the next month to lose weight is to be active for _15-20 on stationary bike__ minutes a day for __3_ days a a week.   Helps circulate blood Keeps blood sugar low Lowers fat in blood...  lowers blood pressure

## 2016-10-23 NOTE — Progress Notes (Addendum)
  Medical Nutrition Therapy:  Appt start time: 830 end time:  900 Visit # 2  Assessment:  Primary concerns today: weight loss, glycemic control, meter  Mr. Melburn brought his meter, lost 3 pounds and his blood sugars are better. shown how to use it and upload the data to his smartphone if desired.   He says he only got 5 pens (1 box of insulin) for the month and the Victoza is "expensive" at 177$/month and could only afford 50 strips. Victoza shows are tier 3 on his plan's formulary so should be same copay as insulin (40$). Have consulted pharmacy on this and provided him with a vial and 1 pen of Novolog Mix 70/30 insulin today and syringes. He likes victoza and would like to continue on it.  Physical activity: walking about 30 minutes a day, thinking about riding his stationary bike. ANTHROPOMETRICS: weight-311.5#, BMI-45.68 SLEEP:need to assess at future visit  MEDICATIONS: victoza- 1.8 mg/day, Humalog 75/25 60 units QAM, 50 units QPM, but has been taking 50 with breakfast and 70 with dinner, no reports of low blood sugars, metfmormin BLOOD SUGAR: today fasting is 117, he is checking two times a day; values more variable from 88-319 in ams, overall they are showing improvement, mostly 100s in PMs. Upon review he was able to identify cause of high fasting blood sugar was his choice of late night snacks while watching TV.  DIETARY INTAKE: Wrote out a very nice food record: 730-8 AM apple juice 8 oz in car/cereal in the bedroom/mini muffin & tea in kitchen 1 -2 Pm mixed fruit in living room/chicken & rice in the living room 7 PM chicken nuggets & salad in kitchen/soup in the living room 10 PM peanut butter crackers in living room/popsicle in living room/take out chicken wings watching basketball   Progress Towards Goal(s):  In progress.   Nutritional Diagnosis:  NB-1.4 Self-monitoring deficit As related to lack of material resources improving  As evidenced by two readings/day on  Meter.     Intervention:  Nutrition education about meter and smartphone use to self manage blood sugars, used motivational interviewing to assist with behavior hange. Coordination of care: request change in dispense quantity of insulin and assistance form pharmacy for cost of victoza . there is not enough of a consistent pattern to be able to adjust his insulin. Continue to work on lifestyle change.  Teaching Method Utilized: Visual, Auditory, Hands on Handouts given during visit include:AVS Barriers to learning/adherence to lifestyle change: lack of material resources Demonstrated degree of understanding via:  Teach Back   Monitoring/Evaluation:  Dietary intake, exercise, meter, and body weight in 2 week(s) by phone 4 weeks in office.  Shaunda Tipping, Butch Penny, [place] 10/23/2016 11:00 AM. .

## 2016-10-24 ENCOUNTER — Telehealth: Payer: Self-pay

## 2016-10-24 NOTE — Telephone Encounter (Signed)
Hi Dr. Inda Castle, patient now has insurance so wanted to see if okay with you to transfer all his meds to [facility] (he previously used [facility]).  Thank you

## 2016-10-24 NOTE — Telephone Encounter (Signed)
Called patient to review medications and provide co-pay assistance information for his Victoza and Novolog 70/30.  I was not able to provide [facility] copay card information over the phone, but confirmed they could take information written down if provided to him. Copay assistance card information provided to patient is below:  Victoza: BIN: [id number] PCN: [id number] GRP: [id number] ID: [id number]  Novolog 70/30: BIN: [id number] PCN: [id number] GRP: [id number] ID: [id number]  Informed patient to provide information when he picked up his prescriptions and he verbalized understanding.   I reviewed his medication list with him and he reports adherence, and read each of his bottles to me. Now with insurance, he prefers to get his RX's from [place], and will need his RX's transferred. I let him know to call with any issues and he verbalized understanding.

## 2016-10-25 ENCOUNTER — Other Ambulatory Visit: Payer: Self-pay | Admitting: Pharmacist

## 2016-10-25 DIAGNOSIS — E1165 Type 2 diabetes mellitus with hyperglycemia: Secondary | ICD-10-CM

## 2016-10-25 DIAGNOSIS — IMO0002 Reserved for concepts with insufficient information to code with codable children: Secondary | ICD-10-CM

## 2016-10-25 DIAGNOSIS — Z794 Long term (current) use of insulin: Secondary | ICD-10-CM

## 2016-10-25 DIAGNOSIS — E785 Hyperlipidemia, unspecified: Secondary | ICD-10-CM

## 2016-10-25 DIAGNOSIS — I10 Essential (primary) hypertension: Secondary | ICD-10-CM

## 2016-10-25 DIAGNOSIS — E118 Type 2 diabetes mellitus with unspecified complications: Secondary | ICD-10-CM

## 2016-10-25 MED ORDER — LIRAGLUTIDE 18 MG/3ML ~~LOC~~ SOPN: 1.8000 mg | PEN_INJECTOR | Freq: Every day | SUBCUTANEOUS | 3 refills | Status: DC

## 2016-10-25 MED ORDER — METFORMIN HCL 500 MG PO TABS: 500.0000 mg | ORAL_TABLET | Freq: Two times a day (BID) | ORAL | 3 refills | Status: DC

## 2016-10-25 MED ORDER — LISINOPRIL 20 MG PO TABS: 30.0000 mg | ORAL_TABLET | Freq: Every day | ORAL | 3 refills | Status: DC

## 2016-10-25 MED ORDER — ATORVASTATIN CALCIUM 40 MG PO TABS: 40.0000 mg | ORAL_TABLET | Freq: Every day | ORAL | 3 refills | Status: DC

## 2016-10-25 MED ORDER — FUROSEMIDE 80 MG PO TABS: 80.0000 mg | ORAL_TABLET | Freq: Every day | ORAL | 0 refills | Status: DC

## 2016-10-25 MED ORDER — CARVEDILOL 25 MG PO TABS: 25.0000 mg | ORAL_TABLET | Freq: Two times a day (BID) | ORAL | 3 refills | Status: DC

## 2016-10-25 MED ORDER — INSULIN ASPART PROT & ASPART (70-30 MIX) 100 UNIT/ML PEN: PEN_INJECTOR | SUBCUTANEOUS | 11 refills | Status: DC

## 2016-10-25 MED ORDER — DILTIAZEM HCL ER COATED BEADS 120 MG PO CP24: 120.0000 mg | ORAL_CAPSULE | Freq: Every day | ORAL | 3 refills | Status: DC

## 2016-10-25 NOTE — Progress Notes (Signed)
Patient now has insurance, prescriptions were transferred to [place]. Patient and PCP aware.

## 2016-10-25 NOTE — Telephone Encounter (Signed)
Hi Dr. Inda Castle, I went ahead and sent in the prescriptions since I had been working on contacting the other pharmacies to have them cancel so he does not continue getting shipments which may be confusing.

## 2016-10-25 NOTE — Telephone Encounter (Signed)
Sure.  Can you transfer the scripts or shall I send in new ones?

## 2016-11-09 ENCOUNTER — Encounter: Payer: Self-pay | Admitting: *Deleted

## 2016-11-19 ENCOUNTER — Encounter: Payer: Self-pay | Admitting: *Deleted

## 2016-11-19 ENCOUNTER — Ambulatory Visit (INDEPENDENT_AMBULATORY_CARE_PROVIDER_SITE_OTHER): Payer: BLUE CROSS/BLUE SHIELD | Admitting: Internal Medicine

## 2016-11-19 ENCOUNTER — Ambulatory Visit (INDEPENDENT_AMBULATORY_CARE_PROVIDER_SITE_OTHER): Payer: BLUE CROSS/BLUE SHIELD | Admitting: Dietician

## 2016-11-19 VITALS — BP 146/63 | HR 73 | Temp 98.2°F | Wt 312.6 lb

## 2016-11-19 DIAGNOSIS — Z794 Long term (current) use of insulin: Secondary | ICD-10-CM | POA: Diagnosis not present

## 2016-11-19 DIAGNOSIS — E1122 Type 2 diabetes mellitus with diabetic chronic kidney disease: Secondary | ICD-10-CM

## 2016-11-19 DIAGNOSIS — N183 Chronic kidney disease, stage 3 unspecified: Secondary | ICD-10-CM

## 2016-11-19 DIAGNOSIS — Z713 Dietary counseling and surveillance: Secondary | ICD-10-CM

## 2016-11-19 DIAGNOSIS — I129 Hypertensive chronic kidney disease with stage 1 through stage 4 chronic kidney disease, or unspecified chronic kidney disease: Secondary | ICD-10-CM

## 2016-11-19 DIAGNOSIS — E118 Type 2 diabetes mellitus with unspecified complications: Secondary | ICD-10-CM

## 2016-11-19 DIAGNOSIS — IMO0002 Reserved for concepts with insufficient information to code with codable children: Secondary | ICD-10-CM

## 2016-11-19 DIAGNOSIS — Z79899 Other long term (current) drug therapy: Secondary | ICD-10-CM

## 2016-11-19 DIAGNOSIS — Z6841 Body Mass Index (BMI) 40.0 and over, adult: Secondary | ICD-10-CM

## 2016-11-19 DIAGNOSIS — I10 Essential (primary) hypertension: Secondary | ICD-10-CM

## 2016-11-19 DIAGNOSIS — E1165 Type 2 diabetes mellitus with hyperglycemia: Secondary | ICD-10-CM

## 2016-11-19 LAB — GLUCOSE, CAPILLARY: Glucose-Capillary: 105 mg/dL — ABNORMAL HIGH (ref 65–99)

## 2016-11-19 LAB — POCT GLYCOSYLATED HEMOGLOBIN (HGB A1C): Hemoglobin A1C: 8.1

## 2016-11-19 MED ORDER — INSULIN ASPART PROT & ASPART (70-30 MIX) 100 UNIT/ML PEN: PEN_INJECTOR | SUBCUTANEOUS | 11 refills | Status: DC

## 2016-11-19 MED ORDER — LIRAGLUTIDE 18 MG/3ML ~~LOC~~ SOPN: 2.4000 mg | PEN_INJECTOR | Freq: Every day | SUBCUTANEOUS | 3 refills | Status: DC

## 2016-11-19 MED ORDER — DILTIAZEM HCL ER COATED BEADS 180 MG PO CP24: 180.0000 mg | ORAL_CAPSULE | Freq: Every day | ORAL | 3 refills | Status: DC

## 2016-11-19 MED ORDER — LISINOPRIL 40 MG PO TABS: 40.0000 mg | ORAL_TABLET | Freq: Every day | ORAL | 3 refills | Status: DC

## 2016-11-19 MED ORDER — LIRAGLUTIDE -WEIGHT MANAGEMENT 18 MG/3ML ~~LOC~~ SOPN: 3.0000 mg | PEN_INJECTOR | Freq: Every day | SUBCUTANEOUS | 3 refills | Status: AC

## 2016-11-19 NOTE — Patient Instructions (Addendum)
Keep working with [name] on [employer] and exercise habits. I think you know what to do, and are thinking of some good ways to get healthier!  For your high blood pressure, I have increased the dose of lisinopril to 40 mg daily and the dose of diltiazem to 180 mg daily.  For your diabetes, I would like to increase the dose of your Victoza (liraglutide).  However, to increase the dose we may need to prescribe you Saxenda, which is the same medicine under different brand. I think we will need to talk to your insurance before making the switch.  Stop taking the Lasix. If the swelling in your legs gets worse, please call the clinic and start taking the Lasix again.

## 2016-11-19 NOTE — Progress Notes (Signed)
  Medical Nutrition Therapy:  Appt start time: 0938  end time:  1829 Visit # 3  Assessment:  Primary concerns today: weight loss, glycemic control.  [name] brought his meter and his phone. He ran out strips so has not checked his blood sugar in 4 days. His weight is stable, his blood sugars are improving. He follows a calorie/portion restricted meal plan. He says he kept food records and was able to identify why his blood sugar rose on specific dates. He continue to work in increasing physical activity, he walked 30-60 minutes two time a week.    He'd like to start going to [place] [facility] for yoga and walking again.He prefers to work on his lifestyle change without assistance. ANTHROPOMETRICS: weight-stable with improved blood suigar SLEEP:he reports good, no problems  MEDICATIONS:  unchanged BLOOD SUGAR: improving as evidenced by his lower A1C and meter readings DIETARY INTAKE: he did not bring food records.   Progress Towards Goal(s):  In progress.   Nutritional Diagnosis:  NB-1.4 Self-monitoring deficit As related to lack of material resources improving  As evidenced by two readings/day on meter.    Intervention:  Nutrition counseling about goal setting, barriers to change,  interviewing to assist with behavior change.  Coordination of care:  Continue to work on lifestyle change.  Teaching Method Utilized: Visual, Auditory, Hands on Handouts given during visit include:AVS Barriers to learning/adherence to lifestyle change: lack of material resources Demonstrated degree of understanding via:  Teach Back   Monitoring/Evaluation:  Dietary intake, exercise, meter, and body weight prn by phone.  [name], [name] 11/19/2016 4:34 PM. .

## 2016-11-19 NOTE — Patient Instructions (Addendum)
A possible goal for the next few weeks- 1-Increase  exercise to 3 times/days a week.  I apologize for not calling you to follow up in 2 weeks!!   Call anytime!  [name]  [phone]   Diabetes Mellitus and Exercise Exercising regularly is important for your overall health, especially when you have diabetes (diabetes mellitus). Exercising is not only about losing weight. It has many health benefits, such as increasing muscle strength and bone density and reducing body fat and stress. This leads to improved fitness, flexibility, and endurance, all of which result in better overall health. Exercise has additional benefits for people with diabetes, including:  Reducing appetite.  Helping to lower and control blood glucose.  Lowering blood pressure.  Helping to control amounts of fatty substances (lipids) in the blood, such as cholesterol and triglycerides.  Helping the body to respond better to insulin (improving insulin sensitivity).  Reducing how much insulin the body needs.  Decreasing the risk for heart disease by: ? Lowering cholesterol and triglyceride levels. ? Increasing the levels of good cholesterol. ? Lowering blood glucose levels.  What is my activity plan? Your health care provider or certified diabetes educator can help you make a plan for the type and frequency of exercise (activity plan) that works for you. Make sure that you:  Do at least 150 minutes of moderate-intensity or vigorous-intensity exercise each week. This could be brisk walking, biking, or water aerobics. ? Do stretching and strength exercises, such as yoga or weightlifting, at least 2 times a week. ? Spread out your activity over at least 3 days of the week.  Get some form of physical activity every day. ? Do not go more than 2 days in a row without some kind of physical activity. ? Avoid being inactive for more than 90 minutes at a time. Take frequent breaks to walk or stretch.  Choose a type of  exercise or activity that you enjoy, and set realistic goals.  Start slowly, and gradually increase the intensity of your exercise over time.  What do I need to know about managing my diabetes?  Check your blood glucose before and after exercising. ? If your blood glucose is higher than 240 mg/dL (13.3 mmol/L) before you exercise, check your urine for ketones. If you have ketones in your urine, do not exercise until your blood glucose returns to normal.  Know the symptoms of low blood glucose (hypoglycemia) and how to treat it. Your risk for hypoglycemia increases during and after exercise. Common symptoms of hypoglycemia can include: ? Hunger. ? Anxiety. ? Sweating and feeling clammy. ? Confusion. ? Dizziness or feeling light-headed. ? Increased heart rate or palpitations. ? Blurry vision. ? Tingling or numbness around the mouth, lips, or tongue. ? Tremors or shakes. ? Irritability.  Keep a rapid-acting carbohydrate snack available before, during, and after exercise to help prevent or treat hypoglycemia.  Avoid injecting insulin into areas of the body that are going to be exercised. For example, avoid injecting insulin into: ? The arms, when playing tennis. ? The legs, when jogging.  Keep records of your exercise habits. Doing this can help you and your health care provider adjust your diabetes management plan as needed. Write down: ? Food that you eat before and after you exercise. ? Blood glucose levels before and after you exercise. ? The type and amount of exercise you have done. ? When your insulin is expected to peak, if you use insulin. Avoid exercising at times when your  insulin is peaking.  When you start a new exercise or activity, work with your health care provider to make sure the activity is safe for you, and to adjust your insulin, medicines, or food intake as needed.  Drink plenty of water while you exercise to prevent dehydration or heat stroke. Drink enough fluid  to keep your urine clear or pale yellow.

## 2016-11-19 NOTE — Progress Notes (Signed)
   CC: "I know I still need to lose some weight."  HPI:  [name] is a 61 y.o. man with history of DM2, HTN, HL, and CKD 3 who presents for management of his diabetes.  Please see A&P for status of his chronic medical conditions.   Past Medical History:  Diagnosis Date  . Cardiomyopathy    Resolved with EF 55% 2011  . CARDIOMYOPATHY 04/22/2006  . Diabetes mellitus   . DIABETIC  RETINOPATHY 02/06/2007  . GERD (gastroesophageal reflux disease)   . Hyperlipemia   . Hypertension   . NEPHROPATHY, DIABETIC 06/08/2006  . Paroxysmal atrial tachycardia (HCC)     Review of Systems:   Review of Systems  Constitutional: Negative for chills and fever.  Respiratory: Negative for cough and shortness of breath.   Cardiovascular: Positive for leg swelling. Negative for chest pain, palpitations, orthopnea and PND.     Physical Exam:  Vitals:   11/19/16 1324  BP: (!) 148/68  Pulse: 75  Temp: 98.2 F (36.8 C)  TempSrc: Oral  SpO2: 98%  Weight: (!) 312 lb 9.6 oz (141.8 kg)    Body mass index is 45.83 kg/m.   Physical Exam  Constitutional: He is oriented to person, place, and time.  Obese man in no distress  Cardiovascular: Normal rate and regular rhythm.   DP pulses 1-2+ bilaterally  Pulmonary/Chest: Effort normal and breath sounds normal.  Musculoskeletal:  Right lower extremity with 2+ pitting edema to mid shin Left lower extremity with trace pitting edema  Neurological: He is alert and oriented to person, place, and time.  Casual gait slow and mildly antalgic  Skin:  Left leg diffusely hyperpigmented, brawny skin, signs of excoriation  Psychiatric: He has a normal mood and affect. His behavior is normal.    Assessment & Plan:   See Encounters Tab for problem based charting.  Patient discussed with Dr. Lynnae January

## 2016-11-19 NOTE — Assessment & Plan Note (Addendum)
BP Readings from Last 3 Encounters:  11/19/16 (!) 148/68  09/04/16 (!) 183/84  03/12/16 (!) 149/67   Lab Results  Component Value Date   CREATININE 1.62 (H) 09/04/2016   Lab Results  Component Value Date   K 4.8 09/04/2016   Current medicaitons: lisinopril 30 mg daily, diltiazem 120 mg daily, carvedilol 25 mg BID, and lasix 80 mg daily.  Assessment BP goal: 130/80 BP control: above goal  Plan Medications:  -Increase lisinopril to 40 mg daily -increase diltiazem to 180 mg daily -Discontinue Lasix  Other:  -BMP today

## 2016-11-19 NOTE — Assessment & Plan Note (Addendum)
   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  Has not seen a nephrologist recently, but has seen Dr Lorrene Reid at [facility] previously.  His creatinine today has risen from previous baseline of around 1.8, and he has a high BUN/creatinine ratio.   He was started on Lasix about a year ago for his lower extremity edema, which now seems to be chronic and not helped by Lasix. Will discontinue Lasix and follow renal function closely. Overdiuresis may be contributing to prerenal etiology of his renal insufficiency.  A/P Worsening CKD3. -Increase lisinopril to 40 mg daily, and diltiazem to 180 mg daily -Discontinue Lasix -Return to clinic in one month for repeat BMP

## 2016-11-19 NOTE — Assessment & Plan Note (Addendum)
Lab Results  Component Value Date   HGBA1C 8.1 11/19/2016   HGBA1C 8.3 09/04/2016   HGBA1C 10.1 03/12/2016    Recent Labs  11/19/16 1337  GLUCAP 105*    Current medications: liraglutide 1.8 mg daily, Metformin 500 mg BID Current insulin: 70/30 60U AM and 50U PM  Checking CBGs twice daily, fasting AM and evening before dinner.  Mostly mid 100s, lowest 88.  No nausea, vomiting, or diarrhea with liraglutide.  Assessment HgbA1c goal: <7.0 Glycemic control: improved, above goal Complications: retinopathy, nephropathy, peripheral neuropathy   Plan Medications:  -Increase Liraglutide to 2.4 mg daily, plan to target 3 mg daily, for diabetes and obesity -May need prior authorization to switch from Victoza to 6 and Insulin:  -continue current insulin Other: -check A1c today -continue working with RD for diabetes education and MNT

## 2016-11-19 NOTE — Assessment & Plan Note (Addendum)
11/20/2016 313 lbs 10/23/2016 312 lbs 09/04/2016 312 lbs 11/22/2015 296 lbs 08/16/2015 315 lbs 06/14/2014 286 lbs  Body mass index is 45.83 kg/m.  Current medications: liraglutide 1.8 mg daily  A/P Morbid obesity complicated by diabetes and hypertension. Weight stable.  -increase liraglutide, target dose 3.0 mg daily -continue medical nutrition therapy with RD

## 2016-11-20 LAB — BMP8+ANION GAP
ANION GAP: 16 mmol/L (ref 10.0–18.0)
BUN/Creatinine Ratio: 28 — ABNORMAL HIGH (ref 10–24)
BUN: 63 mg/dL — AB (ref 8–27)
CALCIUM: 9.5 mg/dL (ref 8.6–10.2)
CO2: 19 mmol/L — ABNORMAL LOW (ref 20–29)
CREATININE: 2.24 mg/dL — AB (ref 0.76–1.27)
Chloride: 108 mmol/L — ABNORMAL HIGH (ref 96–106)
GFR calc Af Amer: 35 mL/min/{1.73_m2} — ABNORMAL LOW (ref >59)
GFR, EST NON AFRICAN AMERICAN: 30 mL/min/{1.73_m2} — AB (ref >59)
Glucose: 95 mg/dL (ref 65–99)
POTASSIUM: 4.8 mmol/L (ref 3.5–5.2)
Sodium: 143 mmol/L (ref 134–144)

## 2016-11-20 NOTE — Progress Notes (Signed)
Internal Medicine Clinic Attending  Case discussed with Dr. O'Sullivan at the time of the visit.  We reviewed the resident's history and exam and pertinent patient test results.  I agree with the assessment, diagnosis, and plan of care documented in the resident's note. 

## 2016-11-21 ENCOUNTER — Encounter: Payer: Self-pay | Admitting: Internal Medicine

## 2016-11-22 DIAGNOSIS — H3341 Traction detachment of retina, right eye: Secondary | ICD-10-CM | POA: Diagnosis not present

## 2016-11-22 DIAGNOSIS — Z9889 Other specified postprocedural states: Secondary | ICD-10-CM | POA: Diagnosis not present

## 2016-11-22 DIAGNOSIS — H4311 Vitreous hemorrhage, right eye: Secondary | ICD-10-CM | POA: Diagnosis not present

## 2016-11-22 DIAGNOSIS — E113513 Type 2 diabetes mellitus with proliferative diabetic retinopathy with macular edema, bilateral: Secondary | ICD-10-CM | POA: Diagnosis not present

## 2016-12-06 ENCOUNTER — Telehealth: Payer: Self-pay | Admitting: Pharmacist

## 2016-12-28 NOTE — Telephone Encounter (Signed)
Called patient for follow up medication help. Unable to reach.

## 2017-02-13 DIAGNOSIS — H5213 Myopia, bilateral: Secondary | ICD-10-CM | POA: Diagnosis not present

## 2017-02-13 DIAGNOSIS — H40022 Open angle with borderline findings, high risk, left eye: Secondary | ICD-10-CM | POA: Diagnosis not present

## 2017-02-13 DIAGNOSIS — H40021 Open angle with borderline findings, high risk, right eye: Secondary | ICD-10-CM | POA: Diagnosis not present

## 2017-02-13 DIAGNOSIS — H40023 Open angle with borderline findings, high risk, bilateral: Secondary | ICD-10-CM | POA: Diagnosis not present

## 2017-02-13 DIAGNOSIS — H1851 Endothelial corneal dystrophy: Secondary | ICD-10-CM | POA: Diagnosis not present

## 2017-02-18 ENCOUNTER — Other Ambulatory Visit: Payer: Self-pay | Admitting: Pharmacist

## 2017-02-18 DIAGNOSIS — E1165 Type 2 diabetes mellitus with hyperglycemia: Secondary | ICD-10-CM

## 2017-02-18 DIAGNOSIS — I10 Essential (primary) hypertension: Secondary | ICD-10-CM

## 2017-02-18 DIAGNOSIS — IMO0002 Reserved for concepts with insufficient information to code with codable children: Secondary | ICD-10-CM

## 2017-02-18 DIAGNOSIS — E118 Type 2 diabetes mellitus with unspecified complications: Secondary | ICD-10-CM

## 2017-02-18 MED ORDER — LISINOPRIL 40 MG PO TABS: 40.0000 mg | ORAL_TABLET | Freq: Every day | ORAL | 3 refills | Status: DC

## 2017-03-07 DIAGNOSIS — E113513 Type 2 diabetes mellitus with proliferative diabetic retinopathy with macular edema, bilateral: Secondary | ICD-10-CM | POA: Diagnosis not present

## 2017-03-07 DIAGNOSIS — Z9889 Other specified postprocedural states: Secondary | ICD-10-CM | POA: Diagnosis not present

## 2017-03-07 DIAGNOSIS — H4311 Vitreous hemorrhage, right eye: Secondary | ICD-10-CM | POA: Diagnosis not present

## 2017-03-07 DIAGNOSIS — H3341 Traction detachment of retina, right eye: Secondary | ICD-10-CM | POA: Diagnosis not present

## 2017-03-07 LAB — HM DIABETES EYE EXAM

## 2017-03-20 ENCOUNTER — Telehealth: Payer: Self-pay | Admitting: *Deleted

## 2017-03-20 NOTE — Telephone Encounter (Signed)
Form for PA for Victoza Pen-Injectors was received 03/15/2017 for PA.  Medication was discontinued for this patient on 11/19/2016.  Sander Nephew, RN 03/20/2017 12:05 PM.

## 2017-03-21 MED ORDER — LIRAGLUTIDE 18 MG/3ML ~~LOC~~ SOPN: 1.2000 mg | PEN_INJECTOR | Freq: Every day | SUBCUTANEOUS | 3 refills | Status: DC

## 2017-03-21 NOTE — Addendum Note (Signed)
Addended by: Forde Dandy on: 03/21/2017 03:55 PM   Modules accepted: Orders

## 2017-04-15 ENCOUNTER — Other Ambulatory Visit: Payer: Self-pay

## 2017-04-15 ENCOUNTER — Encounter: Payer: Self-pay | Admitting: Internal Medicine

## 2017-04-15 ENCOUNTER — Ambulatory Visit: Payer: BLUE CROSS/BLUE SHIELD | Admitting: Internal Medicine

## 2017-04-15 VITALS — BP 176/71 | HR 75 | Temp 97.6°F | Ht 71.0 in | Wt 313.4 lb

## 2017-04-15 DIAGNOSIS — N183 Chronic kidney disease, stage 3 unspecified: Secondary | ICD-10-CM

## 2017-04-15 DIAGNOSIS — E118 Type 2 diabetes mellitus with unspecified complications: Secondary | ICD-10-CM | POA: Diagnosis not present

## 2017-04-15 DIAGNOSIS — IMO0002 Reserved for concepts with insufficient information to code with codable children: Secondary | ICD-10-CM

## 2017-04-15 DIAGNOSIS — E1165 Type 2 diabetes mellitus with hyperglycemia: Secondary | ICD-10-CM

## 2017-04-15 DIAGNOSIS — I129 Hypertensive chronic kidney disease with stage 1 through stage 4 chronic kidney disease, or unspecified chronic kidney disease: Secondary | ICD-10-CM

## 2017-04-15 DIAGNOSIS — E785 Hyperlipidemia, unspecified: Secondary | ICD-10-CM | POA: Diagnosis not present

## 2017-04-15 DIAGNOSIS — I10 Essential (primary) hypertension: Secondary | ICD-10-CM

## 2017-04-15 LAB — GLUCOSE, CAPILLARY: GLUCOSE-CAPILLARY: 221 mg/dL — AB (ref 65–99)

## 2017-04-15 LAB — POCT GLYCOSYLATED HEMOGLOBIN (HGB A1C): Hemoglobin A1C: 7.7

## 2017-04-15 MED ORDER — HYDRALAZINE HCL 10 MG PO TABS: 20.0000 mg | ORAL_TABLET | Freq: Three times a day (TID) | ORAL | 3 refills | Status: DC

## 2017-04-15 NOTE — Assessment & Plan Note (Signed)
Patient's hemoglobin A1c is 7.7 today which is down from 8.1 in June of this year.  Patient brought his meter which showed his fasting blood sugars which were all in the low 100s average is probably about 125.  Continued to encourage the patient to keep up the good work in reducing his A1c.  He follows with an ophthalmologist for his diabetic retinopathy was just seen last month and has been doing well per their report.  Additionally he states that he has had no trouble with his vision recently.  He takes metformin 500 mg twice a day, 60 units of 70/30 every morning and 50 units at night, victoza 1.2 mg daily.    -Will leave the current regimen the way it is, may increase metformin the future however patient has had increasingly worse kidney function -Will recheck renal function panel today.

## 2017-04-15 NOTE — Patient Instructions (Signed)
Mr. Rinks great job with your diabetes a1c was 7.7 today we will keep your diabetes medications the same.  We have ordered labs to evaluate your renal function and see how your cholesterol is doing also.  We will call you if we need to add another medicine for your cholesterol.  Your blood pressure continued to be high on this visit so we added another medication hydralazine that will be waiting for you at your pharmacy.

## 2017-04-15 NOTE — Progress Notes (Signed)
   CC: follow up on T2DM, HTN, CKD III  HPI:  Mr.Zandon B Wages is a 61 y.o. male with past medical history below, reports no recent issues, has been doing pretty well with his diabetes reports fastings in the 100s and similar readings before going to bed.  He does not measure his blood pressure at home so is unsure how that has been doing.  Please see A&P for status of the patient's chronic medical conditions  Past Medical History:  Diagnosis Date  . Cardiomyopathy    Resolved with EF 55% 2011  . CARDIOMYOPATHY 04/22/2006  . Diabetes mellitus   . DIABETIC  RETINOPATHY 02/06/2007  . GERD (gastroesophageal reflux disease)   . Hyperlipemia   . Hypertension   . NEPHROPATHY, DIABETIC 06/08/2006  . Paroxysmal atrial tachycardia (HCC)    Review of Systems:  ROS: Pulmonary: pt denies increased work of breathing, shortness of breath,  Cardiac: pt denies palpitations, chest pain,  Abdominal: pt denies abdominal pain, nausea, vomiting, or diarrhea  Physical Exam:  Vitals:   04/15/17 1323  BP: (!) 176/71  Pulse: 75  Temp: 97.6 F (36.4 C)  TempSrc: Oral  SpO2: 99%  Weight: (!) 313 lb 6.4 oz (142.2 kg)  Height: 5\' 11"  (1.803 m)   Physical Exam  Constitutional: He appears well-developed and well-nourished.  Eyes: Right eye exhibits no discharge. Left eye exhibits no discharge. No scleral icterus.  Cardiovascular: Normal rate, regular rhythm, normal heart sounds and intact distal pulses. Exam reveals no gallop and no friction rub.  No murmur heard. Pulmonary/Chest: Effort normal and breath sounds normal. No respiratory distress. He has no wheezes. He has no rales.  Abdominal: Soft. Bowel sounds are normal. He exhibits no distension and no mass. There is no tenderness. There is no guarding.  Neurological: He is alert.  Psychiatric: He has a normal mood and affect.    Social History   Socioeconomic History  . Marital status: Single    Spouse name: Not on file  . Number of  children: Not on file  . Years of education: Not on file  . Highest education level: Not on file  Social Needs  . Financial resource strain: Not on file  . Food insecurity - worry: Not on file  . Food insecurity - inability: Not on file  . Transportation needs - medical: Not on file  . Transportation needs - non-medical: Not on file  Occupational History  . Not on file  Tobacco Use  . Smoking status: Never Smoker  Substance and Sexual Activity  . Alcohol use: No    Alcohol/week: 0.0 oz  . Drug use: No  . Sexual activity: Not on file  Other Topics Concern  . Not on file  Social History Narrative  . Not on file    Family History  Problem Relation Age of Onset  . Diabetes Mother   . Diabetes Brother     Assessment & Plan:   See Encounters Tab for problem based charting.  Patient seen with Dr. Angelia Mould

## 2017-04-15 NOTE — Assessment & Plan Note (Signed)
BP Readings from Last 3 Encounters:  04/15/17 (!) 176/71  11/19/16 (!) 146/63  09/04/16 (!) 183/84   Patient's blood pressure continues to be elevated, repeat blood pressure not shown in table above was also elevated at 165/70.  Patient on lisinopril 40 mg daily, diltiazem 180 mg daily, carvedilol 25 mg twice a day.    -Added hydralazine 20 mg 3 times a day

## 2017-04-16 ENCOUNTER — Encounter: Payer: Self-pay | Admitting: Internal Medicine

## 2017-04-16 DIAGNOSIS — N2581 Secondary hyperparathyroidism of renal origin: Secondary | ICD-10-CM | POA: Insufficient documentation

## 2017-04-16 LAB — CBC
HEMATOCRIT: 38.1 % (ref 37.5–51.0)
HEMOGLOBIN: 12.8 g/dL — AB (ref 13.0–17.7)
MCH: 29 pg (ref 26.6–33.0)
MCHC: 33.6 g/dL (ref 31.5–35.7)
MCV: 86 fL (ref 79–97)
Platelets: 260 10*3/uL (ref 150–379)
RBC: 4.41 x10E6/uL (ref 4.14–5.80)
RDW: 14.6 % (ref 12.3–15.4)
WBC: 8.7 10*3/uL (ref 3.4–10.8)

## 2017-04-16 LAB — RENAL FUNCTION PANEL
Albumin: 4 g/dL (ref 3.6–4.8)
BUN / CREAT RATIO: 19 (ref 10–24)
BUN: 38 mg/dL — ABNORMAL HIGH (ref 8–27)
CHLORIDE: 104 mmol/L (ref 96–106)
CO2: 18 mmol/L — AB (ref 20–29)
Calcium: 9.3 mg/dL (ref 8.6–10.2)
Creatinine, Ser: 2.03 mg/dL — ABNORMAL HIGH (ref 0.76–1.27)
GFR, EST AFRICAN AMERICAN: 39 mL/min/{1.73_m2} — AB (ref >59)
GFR, EST NON AFRICAN AMERICAN: 34 mL/min/{1.73_m2} — AB (ref >59)
GLUCOSE: 206 mg/dL — AB (ref 65–99)
POTASSIUM: 5.1 mmol/L (ref 3.5–5.2)
Phosphorus: 3.1 mg/dL (ref 2.5–4.5)
SODIUM: 141 mmol/L (ref 134–144)

## 2017-04-16 LAB — HEPATIC FUNCTION PANEL
ALT: 12 IU/L (ref 0–44)
AST: 12 IU/L (ref 0–40)
Alkaline Phosphatase: 102 IU/L (ref 39–117)
BILIRUBIN TOTAL: 0.3 mg/dL (ref 0.0–1.2)
Bilirubin, Direct: 0.09 mg/dL (ref 0.00–0.40)
Total Protein: 6.5 g/dL (ref 6.0–8.5)

## 2017-04-16 LAB — LIPID PANEL
CHOL/HDL RATIO: 4.2 ratio (ref 0.0–5.0)
Cholesterol, Total: 180 mg/dL (ref 100–199)
HDL: 43 mg/dL (ref >39)
LDL CALC: 99 mg/dL (ref 0–99)
TRIGLYCERIDES: 190 mg/dL — AB (ref 0–149)
VLDL CHOLESTEROL CAL: 38 mg/dL (ref 5–40)

## 2017-04-16 LAB — PTH, INTACT AND CALCIUM
Calcium: 9.4 mg/dL (ref 8.6–10.2)
PTH: 78 pg/mL — ABNORMAL HIGH (ref 15–65)

## 2017-04-16 LAB — IRON AND TIBC
IRON: 65 ug/dL (ref 38–169)
Iron Saturation: 18 % (ref 15–55)
TIBC: 360 ug/dL (ref 250–450)
UIBC: 295 ug/dL (ref 111–343)

## 2017-04-16 LAB — FERRITIN: Ferritin: 93 ng/mL (ref 30–400)

## 2017-04-16 NOTE — Assessment & Plan Note (Addendum)
BMP Latest Ref Rng & Units 04/15/2017 04/15/2017 11/19/2016  Glucose 65 - 99 mg/dL 206(H) - 95  BUN 8 - 27 mg/dL 38(H) - 63(H)  Creatinine 0.76 - 1.27 mg/dL 2.03(H) - 2.24(H)  BUN/Creat Ratio 10 - 24 19 - 28(H)  Sodium 134 - 144 mmol/L 141 - 143  Potassium 3.5 - 5.2 mmol/L 5.1 - 4.8  Chloride 96 - 106 mmol/L 104 - 108(H)  CO2 20 - 29 mmol/L 18(L) - 19(L)  Calcium 8.6 - 10.2 mg/dL 9.3 9.4 9.5   Pt used to follow with Dr. Lorrene Reid at [facility], encouraged pt to follow up there some time after the holidays.  For now ordered extensive renal function workup.  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    -continue lisinopril 40mg  daily, diltiazem 180mg  daily -working on good BP control, diabetes now more well controlled -will consider adding vitamin D and or bicarb based on lab results

## 2017-04-16 NOTE — Assessment & Plan Note (Signed)
Lab Results  Component Value Date   CHOL 180 04/15/2017   HDL 43 04/15/2017   LDLCALC 99 04/15/2017   LDLDIRECT 247.6 06/18/2007   TRIG 190 (H) 04/15/2017   CHOLHDL 4.2 04/15/2017   Patient reports good compliance with atorvastatin 40mg  daily.  Unfortunately cholesterol level remains unacceptably high, especially given pts risk factors.    -Will call patient and ask that he begin taking atorvastatin 80mg  daily

## 2017-04-17 NOTE — Progress Notes (Signed)
Internal Medicine Clinic Attending  I saw and evaluated the patient.  I personally confirmed the key portions of the history and exam documented by Dr. Winfrey and I reviewed pertinent patient test results.  The assessment, diagnosis, and plan were formulated together and I agree with the documentation in the resident's note. 

## 2017-04-18 LAB — VITAMIN D 25 HYDROXY (VIT D DEFICIENCY, FRACTURES): Vit D, 25-Hydroxy: 16.4 ng/mL — ABNORMAL LOW (ref 30.0–100.0)

## 2017-04-18 LAB — SPECIMEN STATUS REPORT

## 2017-04-22 ENCOUNTER — Encounter: Payer: Self-pay | Admitting: Internal Medicine

## 2017-04-22 ENCOUNTER — Other Ambulatory Visit: Payer: Self-pay | Admitting: Internal Medicine

## 2017-04-22 DIAGNOSIS — E785 Hyperlipidemia, unspecified: Secondary | ICD-10-CM

## 2017-05-01 ENCOUNTER — Other Ambulatory Visit: Payer: Self-pay | Admitting: Internal Medicine

## 2017-05-01 ENCOUNTER — Telehealth: Payer: Self-pay | Admitting: Internal Medicine

## 2017-05-01 DIAGNOSIS — E785 Hyperlipidemia, unspecified: Secondary | ICD-10-CM

## 2017-05-01 MED ORDER — ATORVASTATIN CALCIUM 80 MG PO TABS: 80.0000 mg | ORAL_TABLET | Freq: Every day | ORAL | 3 refills | Status: DC

## 2017-05-01 NOTE — Telephone Encounter (Signed)
continue to have trouble reaching [name].Would like him to return for BP check and start doubling his dose of atorvastatin to 80mg  daily.  Left him a message again.  Asked triage team to help me contact him and leave another message asking to call the clinic.

## 2017-05-01 NOTE — Addendum Note (Signed)
Addended by: Truddie Crumble on: 05/01/2017 03:19 PM   Modules accepted: Orders

## 2017-05-02 NOTE — Telephone Encounter (Signed)
Called pt - no answer; left message to give us a call back. 

## 2017-05-02 NOTE — Telephone Encounter (Signed)
Pt called back - informed per Dr Shan Levans need to re-check an d double Atorvastatin to 80 mg. Stated he already picked up rx for 80 mg of Atorvastatin instructed to take 1 tab daily.Voiced understanding. Also scheduled an appt in [facility] for tomorrow @ 1545 PM for BP re-check.

## 2017-05-03 ENCOUNTER — Encounter: Payer: Self-pay | Admitting: Internal Medicine

## 2017-05-03 ENCOUNTER — Other Ambulatory Visit: Payer: Self-pay

## 2017-05-03 ENCOUNTER — Ambulatory Visit: Payer: BLUE CROSS/BLUE SHIELD | Admitting: Internal Medicine

## 2017-05-03 DIAGNOSIS — N183 Chronic kidney disease, stage 3 unspecified: Secondary | ICD-10-CM

## 2017-05-03 DIAGNOSIS — Z79899 Other long term (current) drug therapy: Secondary | ICD-10-CM

## 2017-05-03 DIAGNOSIS — I10 Essential (primary) hypertension: Secondary | ICD-10-CM | POA: Diagnosis not present

## 2017-05-03 MED ORDER — HYDRALAZINE HCL 25 MG PO TABS: 25.0000 mg | ORAL_TABLET | Freq: Three times a day (TID) | ORAL | 1 refills | Status: DC

## 2017-05-03 NOTE — Progress Notes (Signed)
   CC: HTN follow up  HPI:  [name] is a 61 y.o. male with a past medical history listed below here today for follow up of her HTN.  For details of today's visit and the status of her chronic medical issues please refer to the assessment and plan.   Past Medical History:  Diagnosis Date  . Cardiomyopathy    Resolved with EF 55% 2011  . CARDIOMYOPATHY 04/22/2006  . CARDIOMYOPATHY 04/22/2006   2- D Echo (04/2010) : The EF is probably 55% with some hypokinesis at the base of the inferior wall. Wall thickness was increased in a pattern of mild LVH. Patient followed by Dr Martinique (Cardiology)     . Diabetes mellitus   . DIABETIC  RETINOPATHY 02/06/2007  . GERD (gastroesophageal reflux disease)   . Hyperlipemia   . Hypertension   . NEPHROPATHY, DIABETIC 06/08/2006  . Paroxysmal atrial tachycardia (HCC)    Review of Systems:   No chest pain or shortness of breath  Physical Exam:  Vitals:   05/03/17 1543 05/03/17 1613 05/03/17 1626  BP:  (!) 149/71 (!) 143/71  Pulse:  76 75  Temp: (!) 97.4 F (36.3 C)    TempSrc: Oral    SpO2: 98%    Weight: (!) 308 lb 11.2 oz (140 kg)    Height: 5\' 11"  (1.803 m)     Physical Exam  Constitutional: He is oriented to person, place, and time and well-developed, well-nourished, and in no distress. No distress.  HENT:  Head: Normocephalic and atraumatic.  Eyes: EOM are normal. Pupils are equal, round, and reactive to light.  Cardiovascular: Normal rate and regular rhythm.  Pulmonary/Chest: Effort normal and breath sounds normal.  Abdominal: Soft. Bowel sounds are normal. There is no tenderness.  Neurological: He is alert and oriented to person, place, and time.  Skin: Skin is warm and dry.  Psychiatric: Mood and affect normal.     Assessment & Plan:   See Encounters Tab for problem based charting.  Patient discussed with Dr. Daryll Drown

## 2017-05-03 NOTE — Patient Instructions (Signed)
[name],  I am going to increase the dose of your hydralazine to 25 mg three times a day. Your new prescription will be for this dose.  FOLLOW-UP INSTRUCTIONS When: February with your PCP For: BP What to bring: your medications

## 2017-05-06 NOTE — Assessment & Plan Note (Signed)
BP Readings from Last 3 Encounters:  05/03/17 (!) 143/71  04/15/17 (!) 176/71  11/19/16 (!) 146/63    Lab Results  Component Value Date   NA 141 04/15/2017   K 5.1 04/15/2017   CREATININE 2.03 (H) 04/15/2017   BP much improved from previous at 143/71 today. Currently prescribed lisinopril 40 mg daily, diltiazem 180 mg daily, carvedilol 25 mg bid, and hydralazine 20 mg tid. He reports compliance with his medications. Denies any adverse effects. No chest pains, SOB, headaches, palpitations.   A/P: BP improved after additional of hydralazine. Will adjust dose to 25 mg daily as still not quite at goal as well as to decrease pill burden (10 mg pills vs 25 mg pills). Of note, his is on two nodal agents with diltiazem and carvedilol. HR appears to be tolerating this well with pulse 75 today. However, would consider changing therapy in the future if rate becomes an issue.  Follow up with PCP in 3 months.

## 2017-05-06 NOTE — Progress Notes (Signed)
Internal Medicine Clinic Attending  Case discussed with Dr. Boswell at the time of the visit.  We reviewed the resident's history and exam and pertinent patient test results.  I agree with the assessment, diagnosis, and plan of care documented in the resident's note.  

## 2017-06-28 ENCOUNTER — Other Ambulatory Visit: Payer: Self-pay | Admitting: *Deleted

## 2017-06-28 DIAGNOSIS — IMO0002 Reserved for concepts with insufficient information to code with codable children: Secondary | ICD-10-CM

## 2017-06-28 DIAGNOSIS — E1165 Type 2 diabetes mellitus with hyperglycemia: Secondary | ICD-10-CM

## 2017-06-28 DIAGNOSIS — E118 Type 2 diabetes mellitus with unspecified complications: Principal | ICD-10-CM

## 2017-06-28 NOTE — Telephone Encounter (Signed)
Fax from [place] has pt taking 2.4 mg daily.

## 2017-06-29 MED ORDER — LIRAGLUTIDE 18 MG/3ML ~~LOC~~ SOPN: 1.2000 mg | PEN_INJECTOR | Freq: Every day | SUBCUTANEOUS | 2 refills | Status: DC

## 2017-06-29 NOTE — Telephone Encounter (Signed)
Patient should be taking 1.2mg  daily per the prescription and instructions on the prescription.  Will refill prescription.  If someone could help contact the pharmacy I would be thankful as I am currently on nights in the ICU.

## 2017-07-01 NOTE — Telephone Encounter (Signed)
[facility] pharmacy informed. 

## 2017-07-08 ENCOUNTER — Encounter: Payer: BLUE CROSS/BLUE SHIELD | Admitting: Internal Medicine

## 2017-07-22 ENCOUNTER — Other Ambulatory Visit: Payer: Self-pay | Admitting: *Deleted

## 2017-08-02 ENCOUNTER — Other Ambulatory Visit: Payer: Self-pay | Admitting: *Deleted

## 2017-08-02 DIAGNOSIS — I10 Essential (primary) hypertension: Secondary | ICD-10-CM

## 2017-08-02 MED ORDER — HYDRALAZINE HCL 25 MG PO TABS: 25.0000 mg | ORAL_TABLET | Freq: Three times a day (TID) | ORAL | 1 refills | Status: DC

## 2017-08-02 NOTE — Telephone Encounter (Signed)
refilled 

## 2017-08-12 ENCOUNTER — Encounter: Payer: BLUE CROSS/BLUE SHIELD | Admitting: Internal Medicine

## 2017-08-12 DIAGNOSIS — H40023 Open angle with borderline findings, high risk, bilateral: Secondary | ICD-10-CM | POA: Diagnosis not present

## 2017-08-12 DIAGNOSIS — H25013 Cortical age-related cataract, bilateral: Secondary | ICD-10-CM | POA: Diagnosis not present

## 2017-08-12 DIAGNOSIS — H2513 Age-related nuclear cataract, bilateral: Secondary | ICD-10-CM | POA: Diagnosis not present

## 2017-08-12 DIAGNOSIS — E113513 Type 2 diabetes mellitus with proliferative diabetic retinopathy with macular edema, bilateral: Secondary | ICD-10-CM | POA: Diagnosis not present

## 2017-08-12 DIAGNOSIS — H2512 Age-related nuclear cataract, left eye: Secondary | ICD-10-CM | POA: Diagnosis not present

## 2017-10-03 ENCOUNTER — Encounter: Payer: Self-pay | Admitting: Internal Medicine

## 2017-10-03 ENCOUNTER — Other Ambulatory Visit: Payer: Self-pay

## 2017-10-03 ENCOUNTER — Ambulatory Visit: Payer: BLUE CROSS/BLUE SHIELD | Admitting: Internal Medicine

## 2017-10-03 VITALS — BP 154/76 | HR 64 | Temp 98.6°F | Ht 71.0 in | Wt 286.5 lb

## 2017-10-03 DIAGNOSIS — R809 Proteinuria, unspecified: Secondary | ICD-10-CM | POA: Diagnosis not present

## 2017-10-03 DIAGNOSIS — Z79899 Other long term (current) drug therapy: Secondary | ICD-10-CM

## 2017-10-03 DIAGNOSIS — E118 Type 2 diabetes mellitus with unspecified complications: Secondary | ICD-10-CM | POA: Diagnosis not present

## 2017-10-03 DIAGNOSIS — E1169 Type 2 diabetes mellitus with other specified complication: Secondary | ICD-10-CM | POA: Diagnosis not present

## 2017-10-03 DIAGNOSIS — Z833 Family history of diabetes mellitus: Secondary | ICD-10-CM

## 2017-10-03 DIAGNOSIS — E785 Hyperlipidemia, unspecified: Secondary | ICD-10-CM | POA: Diagnosis not present

## 2017-10-03 DIAGNOSIS — I10 Essential (primary) hypertension: Secondary | ICD-10-CM

## 2017-10-03 DIAGNOSIS — Z91120 Patient's intentional underdosing of medication regimen due to financial hardship: Secondary | ICD-10-CM

## 2017-10-03 DIAGNOSIS — E1165 Type 2 diabetes mellitus with hyperglycemia: Secondary | ICD-10-CM | POA: Diagnosis not present

## 2017-10-03 DIAGNOSIS — IMO0002 Reserved for concepts with insufficient information to code with codable children: Secondary | ICD-10-CM

## 2017-10-03 DIAGNOSIS — Z794 Long term (current) use of insulin: Secondary | ICD-10-CM

## 2017-10-03 LAB — GLUCOSE, CAPILLARY: Glucose-Capillary: 476 mg/dL — ABNORMAL HIGH (ref 65–99)

## 2017-10-03 LAB — POCT GLYCOSYLATED HEMOGLOBIN (HGB A1C): Hemoglobin A1C: >14

## 2017-10-03 MED ORDER — LISINOPRIL 40 MG PO TABS: 40.0000 mg | ORAL_TABLET | Freq: Every day | ORAL | 3 refills | Status: DC

## 2017-10-03 MED ORDER — GLUCOSE BLOOD VI STRP: ORAL_STRIP | 12 refills | Status: DC

## 2017-10-03 MED ORDER — METFORMIN HCL 500 MG PO TABS: 500.0000 mg | ORAL_TABLET | Freq: Two times a day (BID) | ORAL | 3 refills | Status: DC

## 2017-10-03 MED ORDER — LIRAGLUTIDE 18 MG/3ML ~~LOC~~ SOPN: 1.2000 mg | PEN_INJECTOR | Freq: Every day | SUBCUTANEOUS | 2 refills | Status: DC

## 2017-10-03 MED ORDER — CARVEDILOL 25 MG PO TABS: 25.0000 mg | ORAL_TABLET | Freq: Two times a day (BID) | ORAL | 3 refills | Status: DC

## 2017-10-03 MED ORDER — ATORVASTATIN CALCIUM 80 MG PO TABS: 80.0000 mg | ORAL_TABLET | Freq: Every day | ORAL | 3 refills | Status: DC

## 2017-10-03 MED ORDER — HYDRALAZINE HCL 25 MG PO TABS: 25.0000 mg | ORAL_TABLET | Freq: Three times a day (TID) | ORAL | 1 refills | Status: DC

## 2017-10-03 MED ORDER — DILTIAZEM HCL ER COATED BEADS 180 MG PO CP24: 180.0000 mg | ORAL_CAPSULE | Freq: Every day | ORAL | 3 refills | Status: DC

## 2017-10-03 MED ORDER — INSULIN PEN NEEDLE 31G X 6 MM MISC: 1.0000 | Freq: Three times a day (TID) | 3 refills | Status: AC

## 2017-10-03 MED ORDER — BAYER MICROLET LANCETS MISC: 12 refills | Status: AC

## 2017-10-03 MED ORDER — INSULIN ASPART PROT & ASPART (70-30 MIX) 100 UNIT/ML PEN: PEN_INJECTOR | SUBCUTANEOUS | 11 refills | Status: DC

## 2017-10-03 MED FILL — CONTOUR NEXT STRIPS: 33 days supply | Qty: 100 | Fill #0

## 2017-10-03 MED FILL — LISINOPRIL 40 MG TABLET: 40 | 30 days supply | Qty: 30 | Fill #0

## 2017-10-03 MED FILL — MICROLET LANCETS: 33 days supply | Qty: 100 | Fill #0

## 2017-10-03 NOTE — Progress Notes (Signed)
CC: follow up on T2DM, HTN, Health maintenance  HPI:  [name] is a 63 y.o. male with PMH below.  Reports doing well with no new complaints.  I asked how his diabetes and blood pressure been doing.  He reports he thinks they have been doing pretty well.  On further investigation he has not been checking his blood sugar often, he has not been taking his insulin due to affordability.    Please see A&P for status of the patient's chronic medical conditions  Past Medical History:  Diagnosis Date  . Cardiomyopathy    Resolved with EF 55% 2011  . CARDIOMYOPATHY 04/22/2006  . CARDIOMYOPATHY 04/22/2006   2- D Echo (04/2010) : The EF is probably 55% with some hypokinesis at the base of the inferior wall. Wall thickness was increased in a pattern of mild LVH. Patient followed by Dr Martinique (Cardiology)     . Diabetes mellitus   . DIABETIC  RETINOPATHY 02/06/2007  . GERD (gastroesophageal reflux disease)   . Hyperlipemia   . Hypertension   . NEPHROPATHY, DIABETIC 06/08/2006  . Paroxysmal atrial tachycardia (HCC)    Review of Systems:  ROS: Pulmonary: pt denies increased work of breathing, shortness of breath,  Cardiac: pt denies palpitations, chest pain,  Abdominal: pt denies abdominal pain, nausea, vomiting, or diarrhea  Physical Exam:  There were no vitals filed for this visit. Physical Exam  Constitutional: He appears well-developed and well-nourished.  HENT:  Head: Normocephalic and atraumatic.  Eyes: Right eye exhibits no discharge. Left eye exhibits no discharge. No scleral icterus.  Cardiovascular: Normal rate, regular rhythm, normal heart sounds and intact distal pulses. Exam reveals no gallop and no friction rub.  No murmur heard. Pulmonary/Chest: Effort normal and breath sounds normal. No respiratory distress. He has no wheezes. He has no rales.  Abdominal: Soft. Bowel sounds are normal. He exhibits no distension and no mass. There is no tenderness. There is no guarding.   Neurological: He is alert.    Social History   Socioeconomic History  . Marital status: Single    Spouse name: Not on file  . Number of children: Not on file  . Years of education: Not on file  . Highest education level: Not on file  Occupational History  . Not on file  Social Needs  . Financial resource strain: Not on file  . Food insecurity:    Worry: Not on file    Inability: Not on file  . Transportation needs:    Medical: Not on file    Non-medical: Not on file  Tobacco Use  . Smoking status: Never Smoker  . Smokeless tobacco: Never Used  Substance and Sexual Activity  . Alcohol use: No    Alcohol/week: 0.0 oz  . Drug use: No  . Sexual activity: Not on file  Lifestyle  . Physical activity:    Days per week: Not on file    Minutes per session: Not on file  . Stress: Not on file  Relationships  . Social connections:    Talks on phone: Not on file    Gets together: Not on file    Attends religious service: Not on file    Active member of club or organization: Not on file    Attends meetings of clubs or organizations: Not on file    Relationship status: Not on file  . Intimate partner violence:    Fear of current or ex partner: Not on file  Emotionally abused: Not on file    Physically abused: Not on file    Forced sexual activity: Not on file  Other Topics Concern  . Not on file  Social History Narrative  . Not on file    Family History  Problem Relation Age of Onset  . Diabetes Mother   . Diabetes Brother     Assessment & Plan:   See Encounters Tab for problem based charting.  Patient discussed with Dr. Rebeca Alert

## 2017-10-03 NOTE — Assessment & Plan Note (Signed)
Lab Results  Component Value Date   CHOL 180 04/15/2017   HDL 43 04/15/2017   LDLCALC 99 04/15/2017   LDLDIRECT 247.6 06/18/2007   TRIG 190 (H) 04/15/2017   CHOLHDL 4.2 04/15/2017  Pt reports good compliance with lipitor 80mg  daily, this is an increase that began in November of last year.    -will recheck lipid panel today on higher dosage

## 2017-10-03 NOTE — Patient Instructions (Addendum)
[name], it is good to see you today.  Unfortunately your diabetes is out of control today.  Your A1C is greater than 14.  We will check your renal function today and check your urine for protein.  We will need to get you back on track with your diabetes.  We know you can do it because you were close to having good control, we just need to keep working hard and get you back to where you were.  You will need to restart your 70/30 insulin today at the appropriate dose.  Please come back in two weeks with a blood sugar log that I gave you.  We will look and see how your diabetes is doing then.  We will check your lipid panel today to see how your cholesterol is doing on the higher dose of your lipitor.

## 2017-10-03 NOTE — Assessment & Plan Note (Addendum)
BP Readings from Last 3 Encounters:  10/03/17 (!) 154/76  05/03/17 (!) 143/71  04/15/17 (!) 176/71   BP higher than goal, do not feel he received an accurate reading when he first arrived.  Pt on significant blood pressure regimen already.    -will continue carvedilol 25mg  BID, hydralazine 25mg  TID,  Diltiazem 180mg  daily, lisinopril 40mg  daily -will recheck when pt returns for diabetes follow up

## 2017-10-03 NOTE — Assessment & Plan Note (Signed)
Lab Results  Component Value Date   HGBA1C >14.0 10/03/2017   A1C up today from 7.7 to >14.  He has not been taking his medicines he is out of victoza, he still takes metformin, he is also almost out of his 70/30 insulin and takes it only intermittently to ration it.  He says the bill at the pharmacy the last time he went for refills was $1000.  He did not inquire further, in the past he sorted this out and got everything for 40 dollars but this time did not feel the need to inquire further.  He missed my last continuity appointment so this has been going on for some time.  He brought his meter today and it had two readings on it one was 340 the other 420.    -continue renally dosed metformin 500mg  BID -renal function panel, urine microalbumin -sent all prescriptions to [facility] -continue victoza 1.2mg  daily -referred pt to Dr. Mannie Stabile our pharmacist, she will see pt today -told pt the importance of letting us know when he has a problem obtaining his medication -once taking insulin consistently and blood sugar under better control will be a good candidate for invokana 100mg  daily

## 2017-10-04 LAB — LIPID PANEL
Chol/HDL Ratio: 3.9 ratio (ref 0.0–5.0)
Cholesterol, Total: 181 mg/dL (ref 100–199)
HDL: 46 mg/dL (ref >39)
LDL Calculated: 85 mg/dL (ref 0–99)
Triglycerides: 248 mg/dL — ABNORMAL HIGH (ref 0–149)
VLDL Cholesterol Cal: 50 mg/dL — ABNORMAL HIGH (ref 5–40)

## 2017-10-04 LAB — RENAL FUNCTION PANEL
Albumin: 3.8 g/dL (ref 3.6–4.8)
BUN/Creatinine Ratio: 15 (ref 10–24)
BUN: 30 mg/dL — ABNORMAL HIGH (ref 8–27)
CO2: 24 mmol/L (ref 20–29)
Calcium: 9.7 mg/dL (ref 8.6–10.2)
Chloride: 100 mmol/L (ref 96–106)
Creatinine, Ser: 1.99 mg/dL — ABNORMAL HIGH (ref 0.76–1.27)
GFR calc Af Amer: 40 mL/min/{1.73_m2} — ABNORMAL LOW (ref >59)
GFR calc non Af Amer: 34 mL/min/{1.73_m2} — ABNORMAL LOW (ref >59)
Glucose: 441 mg/dL — ABNORMAL HIGH (ref 65–99)
Phosphorus: 3 mg/dL (ref 2.5–4.5)
Potassium: 4.7 mmol/L (ref 3.5–5.2)
Sodium: 138 mmol/L (ref 134–144)

## 2017-10-04 LAB — MICROALBUMIN, URINE: Microalbumin, Urine: 1929.6 ug/mL

## 2017-10-07 NOTE — Progress Notes (Signed)
Internal Medicine Clinic Attending  Case discussed with Dr. Shan Levans  at the time of the visit.  We reviewed the resident's history and exam and pertinent patient test results.  I agree with the assessment, diagnosis, and plan of care documented in the resident's note.  Unfortunately, lapse in care for his diabetes due to lack of medications with resulting jump in A1C and microalbumin. Will work with him to get back on track, primarily through ensuring affordability of medication regimen and ensuring good communication if he has difficulty obtaining his medicines.   Oda Kilts, MD

## 2017-10-17 ENCOUNTER — Ambulatory Visit: Payer: BLUE CROSS/BLUE SHIELD | Admitting: Pharmacist

## 2017-10-17 ENCOUNTER — Encounter: Payer: Self-pay | Admitting: Internal Medicine

## 2017-10-17 ENCOUNTER — Other Ambulatory Visit: Payer: Self-pay

## 2017-10-17 ENCOUNTER — Ambulatory Visit: Payer: BLUE CROSS/BLUE SHIELD | Admitting: Internal Medicine

## 2017-10-17 VITALS — BP 179/75 | HR 76 | Temp 97.9°F | Ht 71.0 in | Wt 294.0 lb

## 2017-10-17 DIAGNOSIS — E118 Type 2 diabetes mellitus with unspecified complications: Secondary | ICD-10-CM | POA: Diagnosis not present

## 2017-10-17 DIAGNOSIS — Z87448 Personal history of other diseases of urinary system: Secondary | ICD-10-CM | POA: Diagnosis not present

## 2017-10-17 DIAGNOSIS — Z79899 Other long term (current) drug therapy: Secondary | ICD-10-CM

## 2017-10-17 DIAGNOSIS — Z794 Long term (current) use of insulin: Secondary | ICD-10-CM | POA: Diagnosis not present

## 2017-10-17 DIAGNOSIS — N183 Chronic kidney disease, stage 3 unspecified: Secondary | ICD-10-CM

## 2017-10-17 DIAGNOSIS — I10 Essential (primary) hypertension: Secondary | ICD-10-CM | POA: Diagnosis not present

## 2017-10-17 DIAGNOSIS — E785 Hyperlipidemia, unspecified: Secondary | ICD-10-CM

## 2017-10-17 DIAGNOSIS — E1165 Type 2 diabetes mellitus with hyperglycemia: Secondary | ICD-10-CM

## 2017-10-17 DIAGNOSIS — IMO0002 Reserved for concepts with insufficient information to code with codable children: Secondary | ICD-10-CM

## 2017-10-17 MED ORDER — FISH OIL 1200 MG PO CAPS: 2.0000 | ORAL_CAPSULE | Freq: Every day | ORAL | 11 refills | Status: DC

## 2017-10-17 NOTE — Progress Notes (Signed)
   CC: Follow up for diabetes after restarting medications  HPI:  [name] is a 62 y.o. male with PMHx detailed below presenting for a 2 week follow up after restarting his diabetes medications. He previously stopped all treatment due to running out and prohibitive cost to refill these. Hgb A1c was worsened to 14% at that time.  See problem based assessment and plan below for additional details.  Type II diabetes mellitus with complication, uncontrolled His home blood glucose measurements since restarting regular home insulin administration range in the 80s to 140s which is excellent control.  He is not having any symptomatic hypoglycemia. Plan: Patient also is meeting with Dr. Maudie Mercury for discussion of medicines, medicine access and provided samples today Continue current insulin 70/30 premix 60 units in the morning and 50 units in evening Continue Victoza 1.2 mg daily Continue metformin 500 mg twice daily  Essential hypertension His blood pressure is elevated again today despite reported adherence to home medication regimen.  He does have some peripheral edema that seems more than previously documented.  His weight in clinic is 8 pounds more than 2 weeks ago. Given his history of chronic kidney disease I will check a metabolic panel today and ensure there is no worsening of renal function to suggest a change in function to explain fluid retention.   Past Medical History:  Diagnosis Date  . Cardiomyopathy    Resolved with EF 55% 2011  . CARDIOMYOPATHY 04/22/2006  . CARDIOMYOPATHY 04/22/2006   2- D Echo (04/2010) : The EF is probably 55% with some hypokinesis at the base of the inferior wall. Wall thickness was increased in a pattern of mild LVH. Patient followed by Dr Martinique (Cardiology)     . Diabetes mellitus   . DIABETIC  RETINOPATHY 02/06/2007  . GERD (gastroesophageal reflux disease)   . Hyperlipemia   . Hypertension   . NEPHROPATHY, DIABETIC 06/08/2006  . Paroxysmal atrial  tachycardia (HCC)     Review of Systems: Review of Systems  Constitutional: Negative for chills and fever.  Eyes: Negative for blurred vision.  Respiratory: Negative for shortness of breath.   Cardiovascular: Positive for leg swelling. Negative for chest pain.  Genitourinary: Negative for frequency.  Skin: Negative for rash.  Neurological: Negative for dizziness.  Endo/Heme/Allergies: Negative for polydipsia.     Physical Exam: Vitals:   10/17/17 1320  BP: (!) 179/75  Pulse: 76  Temp: 97.9 F (36.6 C)  TempSrc: Oral  SpO2: 95%  Weight: 294 lb (133.4 kg)  Height: 5\' 11"  (1.803 m)   GENERAL- alert, co-operative, NAD HEENT- Atraumatic, PERRL, oral mucosa appears moist CARDIAC- RRR, no murmurs, rubs or gallops. RESP- CTAB, no wheezes or crackles. ABDOMEN- Soft, nontender, no guarding or rebound EXTREMITIES- 1+ pitting pedal edema below the knee bilaterally SKIN- Warm, dry, No rash or lesion. PSYCH- Normal mood and affect, appropriate thought content and speech.   Assessment & Plan:   See encounters tab for problem based medical decision making.   Patient discussed with Dr. Dareen Piano

## 2017-10-17 NOTE — Patient Instructions (Addendum)
You are doing a great job with your diabetes! I do not think we need to change your current medicines significantly at this time. We can check the Hgb A1c again after you are on a steady dose of medicine for a few weeks.  Your blood pressure is high today and you also have some extra fluid in the legs. I want to repeat your blood test today and make sure your medicines are not causing any trouble with your fluid or kidney function.

## 2017-10-17 NOTE — Progress Notes (Signed)
Patient was seen today in a co-visit between the physician and pharmacist.  During this visit, medications were discussed. Patient reports ongoing challenges accessing Novolog 70/30 and Victoza. Samples provided. Advised patient to contact clinic if further help needed. Patient verbalized understanding.  Please see documentation under Dr. Maudie Flakes visit today for more details.

## 2017-10-18 LAB — BMP8+ANION GAP
Anion Gap: 15 mmol/L (ref 10.0–18.0)
BUN / CREAT RATIO: 14 (ref 10–24)
BUN: 24 mg/dL (ref 8–27)
CO2: 22 mmol/L (ref 20–29)
Calcium: 9 mg/dL (ref 8.6–10.2)
Chloride: 108 mmol/L — ABNORMAL HIGH (ref 96–106)
Creatinine, Ser: 1.7 mg/dL — ABNORMAL HIGH (ref 0.76–1.27)
GFR calc non Af Amer: 42 mL/min/{1.73_m2} — ABNORMAL LOW (ref >59)
GFR, EST AFRICAN AMERICAN: 48 mL/min/{1.73_m2} — AB (ref >59)
Glucose: 83 mg/dL (ref 65–99)
Potassium: 4.1 mmol/L (ref 3.5–5.2)
SODIUM: 145 mmol/L — AB (ref 134–144)

## 2017-10-21 NOTE — Assessment & Plan Note (Signed)
His blood pressure is elevated again today despite reported adherence to home medication regimen.  He does have some peripheral edema that seems more than previously documented.  His weight in clinic is 8 pounds more than 2 weeks ago. Given his history of chronic kidney disease I will check a metabolic panel today and ensure there is no worsening of renal function to suggest a change in function to explain fluid retention.

## 2017-10-21 NOTE — Progress Notes (Signed)
Internal Medicine Clinic Attending  Case discussed with Dr. Rice at the time of the visit.  We reviewed the resident's history and exam and pertinent patient test results.  I agree with the assessment, diagnosis, and plan of care documented in the resident's note.  

## 2017-10-21 NOTE — Assessment & Plan Note (Signed)
His home blood glucose measurements since restarting regular home insulin administration range in the 80s to 140s which is excellent control.  He is not having any symptomatic hypoglycemia. Plan: Patient also is meeting with Dr. Maudie Mercury for discussion of medicines, medicine access and provided samples today Continue current insulin 70/30 premix 60 units in the morning and 50 units in evening Continue Victoza 1.2 mg daily Continue metformin 500 mg twice daily

## 2017-11-25 ENCOUNTER — Encounter: Payer: BLUE CROSS/BLUE SHIELD | Admitting: Internal Medicine

## 2017-11-25 ENCOUNTER — Encounter: Payer: Self-pay | Admitting: Internal Medicine

## 2017-11-27 ENCOUNTER — Encounter: Payer: Self-pay | Admitting: Dietician

## 2017-12-19 DIAGNOSIS — Z9889 Other specified postprocedural states: Secondary | ICD-10-CM | POA: Diagnosis not present

## 2017-12-19 DIAGNOSIS — E113513 Type 2 diabetes mellitus with proliferative diabetic retinopathy with macular edema, bilateral: Secondary | ICD-10-CM | POA: Diagnosis not present

## 2017-12-19 DIAGNOSIS — H3341 Traction detachment of retina, right eye: Secondary | ICD-10-CM | POA: Diagnosis not present

## 2017-12-19 DIAGNOSIS — H25813 Combined forms of age-related cataract, bilateral: Secondary | ICD-10-CM | POA: Diagnosis not present

## 2017-12-19 DIAGNOSIS — H4311 Vitreous hemorrhage, right eye: Secondary | ICD-10-CM | POA: Diagnosis not present

## 2017-12-26 ENCOUNTER — Ambulatory Visit: Payer: BLUE CROSS/BLUE SHIELD | Admitting: Pharmacist

## 2017-12-26 VITALS — BP 177/83 | HR 61

## 2017-12-26 DIAGNOSIS — Z79899 Other long term (current) drug therapy: Secondary | ICD-10-CM

## 2017-12-26 NOTE — Progress Notes (Signed)
S: [name] is a 62 y.o. male who reports to clinical pharmacist appointment for diabetes management. Patient did not bring medication bottles.   No Known Allergies Prior to Admission medications   Medication Sig Start Date End Date Taking? Authorizing Provider  acetaminophen (TYLENOL) 500 MG tablet Take 500 mg by mouth every 6 (six) hours as needed.      [provider]  aspirin 81 MG tablet Take 1 tablet (81 mg total) by mouth daily. 12/19/15   Debbrah Alar, NP  atorvastatin (LIPITOR) 80 MG tablet Take 1 tablet (80 mg total) by mouth daily. 10/03/17   Katherine Roan, MD  BAYER MICROLET LANCETS lancets Check blood sugar 3 times a day as instructed 10/03/17   Katherine Roan, MD  Blood Glucose Monitoring Suppl (ONE TOUCH ULTRA MINI) w/Device KIT 1 strip by Other route 3 (three) times daily. 11/24/15   Corky Sox, MD  carvedilol (COREG) 25 MG tablet Take 1 tablet (25 mg total) by mouth 2 (two) times daily with a meal. 10/03/17   Katherine Roan, MD  diltiazem (CARDIZEM CD) 180 MG 24 hr capsule Take 1 capsule (180 mg total) by mouth daily. 10/03/17 01/01/18  Katherine Roan, MD  Glucosamine-Chondroitin (OSTEO BI-FLEX REGULAR STRENGTH) 250-200 MG TABS Take 4 caplets per day with food     [provider]  glucose blood (BAYER CONTOUR NEXT TEST) test strip Check blood sugar 3 times a day as instructed 10/03/17   Katherine Roan, MD  hydrALAZINE (APRESOLINE) 25 MG tablet Take 1 tablet (25 mg total) by mouth 3 (three) times daily. 10/03/17 01/03/18  Katherine Roan, MD  insulin aspart protamine - aspart (NOVOLOG MIX 70/30 FLEXPEN) (70-30) 100 UNIT/ML FlexPen Inject 60U into the skin in the morning with breakfast and in the evening 50U with dinner 10/03/17   Katherine Roan, MD  Insulin Pen Needle 31G X 6 MM MISC 1 Stick by Other route 3 (three) times daily. Use with insulin and liraglutide. 10/03/17   Katherine Roan, MD  liraglutide (VICTOZA) 18 MG/3ML SOPN Inject  0.2 mLs (1.2 mg total) into the skin daily. 10/03/17   Katherine Roan, MD  lisinopril (PRINIVIL,ZESTRIL) 40 MG tablet Take 1 tablet (40 mg total) by mouth daily. 10/03/17   Katherine Roan, MD  metFORMIN (GLUCOPHAGE) 500 MG tablet Take 1 tablet (500 mg total) by mouth 2 (two) times daily with a meal. 10/03/17 10/03/18  Katherine Roan, MD  Omega-3 Fatty Acids (FISH OIL) 1200 MG CAPS Take 2 capsules (2,400 mg total) by mouth daily. 10/17/17   Katherine Roan, MD   Past Medical History:  Diagnosis Date  . Cardiomyopathy    Resolved with EF 55% 2011  . CARDIOMYOPATHY 04/22/2006  . CARDIOMYOPATHY 04/22/2006   2- D Echo (04/2010) : The EF is probably 55% with some hypokinesis at the base of the inferior wall. Wall thickness was increased in a pattern of mild LVH. Patient followed by Dr Martinique (Cardiology)     . Diabetes mellitus   . DIABETIC  RETINOPATHY 02/06/2007  . GERD (gastroesophageal reflux disease)   . Hyperlipemia   . Hypertension   . NEPHROPATHY, DIABETIC 06/08/2006  . Paroxysmal atrial tachycardia (HCC)    Social History   Socioeconomic History  . Marital status: Single    Spouse name: Not on file  . Number of children: Not on file  . Years of education: Not on file  . Highest education level:  Not on file  Occupational History  . Not on file  Social Needs  . Financial resource strain: Not on file  . Food insecurity:    Worry: Not on file    Inability: Not on file  . Transportation needs:    Medical: Not on file    Non-medical: Not on file  Tobacco Use  . Smoking status: Never Smoker  . Smokeless tobacco: Never Used  Substance and Sexual Activity  . Alcohol use: No    Alcohol/week: 0.0 oz  . Drug use: No  . Sexual activity: Not on file  Lifestyle  . Physical activity:    Days per week: Not on file    Minutes per session: Not on file  . Stress: Not on file  Relationships  . Social connections:    Talks on phone: Not on file    Gets together: Not on file     Attends religious service: Not on file    Active member of club or organization: Not on file    Attends meetings of clubs or organizations: Not on file    Relationship status: Not on file  Other Topics Concern  . Not on file  Social History Narrative  . Not on file   Family History  Problem Relation Age of Onset  . Diabetes Mother   . Diabetes Brother     O:    Component Value Date/Time   CHOL 181 10/03/2017 1435   HDL 46 10/03/2017 1435   TRIG 248 (H) 10/03/2017 1435   AST 12 04/15/2017 1442   ALT 12 04/15/2017 1442   NA 145 (H) 10/17/2017 1401   K 4.1 10/17/2017 1401   CL 108 (H) 10/17/2017 1401   CO2 22 10/17/2017 1401   GLUCOSE 83 10/17/2017 1401   GLUCOSE 350 (H) 06/14/2014 1402   HGBA1C >14.0 10/03/2017 1345   HGBA1C 9.2 05/05/2010 1105   BUN 24 10/17/2017 1401   CREATININE 1.70 (H) 10/17/2017 1401   CREATININE 1.93 (H) 06/14/2014 1402   CALCIUM 9.0 10/17/2017 1401   GFRNONAA 42 (L) 10/17/2017 1401   GFRNONAA 37 (L) 06/14/2014 1402   GFRAA 48 (L) 10/17/2017 1401   GFRAA 43 (L) 06/14/2014 1402   WBC 8.7 04/15/2017 1442   WBC 10.2 05/24/2014 1625   HGB 12.8 (L) 04/15/2017 1442   HCT 38.1 04/15/2017 1442   PLT 260 04/15/2017 1442   TSH 1.194 03/24/2009 0151   Ht Readings from Last 2 Encounters:  10/17/17 5' 11"  (1.803 m)  10/03/17 5' 11"  (1.803 m)   Wt Readings from Last 2 Encounters:  10/17/17 294 lb (133.4 kg)  10/03/17 286 lb 8 oz (130 kg)   There is no height or weight on file to calculate BMI. BP Readings from Last 3 Encounters:  12/26/17 (!) 177/83  10/17/17 (!) 179/75  10/03/17 (!) 154/76     A/P: Due to a high BP at last visit in May, repeat BP taken today: 177/83. [name] denies any symptoms of high blood pressure and reports that he is in need of medication refills from the pharmacy and might not have taken all blood pressure medications today. Educated about limiting salt in the diet, taking all blood pressure medications, and increasing  exercise as tolerable.   [name] takes insulin aspart protamine-aspart (Novolog Mix 70/30 Flexpen) 60 units QAM with breakfast and 50 units QPM with dinner, liraglutide (Victoza) 1.8 mg QD, and metformin 500 mg BID for his diabetes.  He reports rationing  his insulin for the past few weeks to make it last longer due to co-pays of $[phone], despite using co-pay coupons plus insurance. To ration his insulin, he starts using fewer units with each injection when he notices that he does not have many pens left. He also reports his Victoza ran out one week ago and he has been unable to refill it due to cost.  He reports checking his BG daily in the morning and evening before Novolog 70/30 injections. He did not bring his meter or a BG log, but reports values of 200-300. When he is able to take all medications as prescribed, he reports BG in lower 100s.  Mr. Taney denies any episodes of hypoglycemia but did admit to being unaware of hypoglycemia symptoms. He was educated on signs of hypoglycemia (including shakiness/sweating) and the rule of 15 to correct low BG. Mr. Bowsher reported having access to hard candy and juice in the event of a hypoglycemic episode.  Samples of Novolog 70/30 and Victoza were provided to the patient and he was advised to contact us in the future before deciding to ration insulin or running out of medications.  Patient advised to follow up in 1 month or sooner if any changes in condition or questions regarding medications arise.   The patient verbalized understanding of information provided by repeating back concepts discussed.   Oralia Manis, PharmD Candidate

## 2018-01-27 MED FILL — metFORMIN HCL 500 MG TABS: 500 | 90 days supply | Qty: 180 | Fill #0

## 2018-01-27 MED FILL — CARVEDILOL 25 MG TABLET: 25 | 90 days supply | Qty: 180 | Fill #0

## 2018-01-27 MED FILL — LISINOPRIL 40 MG TABLET: 40 | 30 days supply | Qty: 30 | Fill #1

## 2018-01-28 ENCOUNTER — Ambulatory Visit: Payer: BLUE CROSS/BLUE SHIELD | Admitting: Pharmacist

## 2018-01-28 VITALS — BP 152/76 | HR 76

## 2018-01-28 DIAGNOSIS — E118 Type 2 diabetes mellitus with unspecified complications: Principal | ICD-10-CM

## 2018-01-28 DIAGNOSIS — E1165 Type 2 diabetes mellitus with hyperglycemia: Secondary | ICD-10-CM

## 2018-01-28 DIAGNOSIS — IMO0002 Reserved for concepts with insufficient information to code with codable children: Secondary | ICD-10-CM

## 2018-01-29 NOTE — Progress Notes (Addendum)
Patient requested help with insulin access. He has tried applying for various assistance program with no approval, provided samples for now. Advised patient to schedule PCP follow up for BG/BP management and will continue to assist in patient's care. Patient verbalized understanding.

## 2018-02-26 ENCOUNTER — Ambulatory Visit: Payer: BLUE CROSS/BLUE SHIELD | Admitting: Pharmacist

## 2018-02-26 DIAGNOSIS — Z79899 Other long term (current) drug therapy: Secondary | ICD-10-CM

## 2018-02-26 NOTE — Progress Notes (Signed)
Help with med access

## 2018-04-03 ENCOUNTER — Telehealth: Payer: Self-pay | Admitting: Dietician

## 2018-04-03 DIAGNOSIS — E118 Type 2 diabetes mellitus with unspecified complications: Principal | ICD-10-CM

## 2018-04-03 DIAGNOSIS — E1165 Type 2 diabetes mellitus with hyperglycemia: Secondary | ICD-10-CM

## 2018-04-03 DIAGNOSIS — IMO0002 Reserved for concepts with insufficient information to code with codable children: Secondary | ICD-10-CM

## 2018-04-03 NOTE — Telephone Encounter (Signed)
Due for an appointment with Dr. Shan Levans. He agrees to next Tuesday at 915 am. He is out of test strips and also running short on his insulin. I informed him about the [facility] [employer] meter that is 10$ and the strips are 10$ for 50 strips. He will be Medicare eligible in February 2020

## 2018-04-08 ENCOUNTER — Ambulatory Visit: Payer: BLUE CROSS/BLUE SHIELD | Admitting: Internal Medicine

## 2018-04-08 ENCOUNTER — Encounter: Payer: Self-pay | Admitting: Internal Medicine

## 2018-04-08 ENCOUNTER — Encounter (INDEPENDENT_AMBULATORY_CARE_PROVIDER_SITE_OTHER): Payer: Self-pay

## 2018-04-08 VITALS — BP 153/68 | HR 100 | Temp 98.6°F | Wt 295.3 lb

## 2018-04-08 DIAGNOSIS — Z794 Long term (current) use of insulin: Secondary | ICD-10-CM

## 2018-04-08 DIAGNOSIS — E1165 Type 2 diabetes mellitus with hyperglycemia: Secondary | ICD-10-CM

## 2018-04-08 DIAGNOSIS — I129 Hypertensive chronic kidney disease with stage 1 through stage 4 chronic kidney disease, or unspecified chronic kidney disease: Secondary | ICD-10-CM | POA: Diagnosis not present

## 2018-04-08 DIAGNOSIS — E611 Iron deficiency: Secondary | ICD-10-CM

## 2018-04-08 DIAGNOSIS — E1129 Type 2 diabetes mellitus with other diabetic kidney complication: Secondary | ICD-10-CM | POA: Diagnosis not present

## 2018-04-08 DIAGNOSIS — N183 Chronic kidney disease, stage 3 unspecified: Secondary | ICD-10-CM

## 2018-04-08 DIAGNOSIS — E1122 Type 2 diabetes mellitus with diabetic chronic kidney disease: Secondary | ICD-10-CM | POA: Diagnosis not present

## 2018-04-08 DIAGNOSIS — Z833 Family history of diabetes mellitus: Secondary | ICD-10-CM

## 2018-04-08 DIAGNOSIS — E118 Type 2 diabetes mellitus with unspecified complications: Principal | ICD-10-CM

## 2018-04-08 DIAGNOSIS — R809 Proteinuria, unspecified: Secondary | ICD-10-CM

## 2018-04-08 DIAGNOSIS — N2581 Secondary hyperparathyroidism of renal origin: Secondary | ICD-10-CM

## 2018-04-08 DIAGNOSIS — I10 Essential (primary) hypertension: Secondary | ICD-10-CM

## 2018-04-08 DIAGNOSIS — IMO0002 Reserved for concepts with insufficient information to code with codable children: Secondary | ICD-10-CM

## 2018-04-08 DIAGNOSIS — Z79899 Other long term (current) drug therapy: Secondary | ICD-10-CM

## 2018-04-08 LAB — GLUCOSE, CAPILLARY: Glucose-Capillary: 101 mg/dL — ABNORMAL HIGH (ref 70–99)

## 2018-04-08 LAB — POCT GLYCOSYLATED HEMOGLOBIN (HGB A1C): Hemoglobin A1C: 7.5 % — AB (ref 4.0–5.6)

## 2018-04-08 MED ORDER — CANAGLIFLOZIN 300 MG PO TABS: ORAL_TABLET | ORAL | 0 refills | Status: DC

## 2018-04-08 NOTE — Assessment & Plan Note (Addendum)
Pt still has not reconnected with [facility].  His renal function has been stable.  We will repeat a renal function panel today.  Last year when I checked a metabolic bone disease panel it was not overly concerning for metabolic bone disease but was close to the threshold where we would need to treat.    -Repeated RFP, PTH, ferritin, transferrin sat, and vitamin D today is consistent with an appointment disease decreased iron absorption due to increased hip side levels -Supplement iron ferrous gluconate twice a day with meals, supplement calcitriol 0.25 twice daily -starting invokana 150mg  daily to we will have to keep an eye on renal function he is getting close to CKD 4 -bicarb slightly low today.  Will hold off on supplementing for now with difficult to control HTN

## 2018-04-08 NOTE — Patient Instructions (Addendum)
[name], I would like to start you on a new medicine that will help with your diabetes, HTN and CKD.  It is called Invokana, I have attached some more information about it below.  I need you to come in in about a week or two with all your medicines so we can go over them and make sure you have everything that you need.    Canagliflozin oral tablets What is this medicine? CANAGLIFLOZIN (KAN a gli FLOE zin) helps to treat type 2 diabetes. It helps to control blood sugar. Treatment is combined with diet and exercise. This medicine may be used for other purposes; ask your health care provider or pharmacist if you have questions. COMMON BRAND NAME(S): Invokana What should I tell my health care provider before I take this medicine? They need to know if you have any of these conditions: -artery disease -dehydration -diabetic ketoacidosis -diet low in salt -eating less due to illness, surgery, dieting, or any other reason -foot sores -having surgery -high cholesterol -high levels of potassium in the blood -history of amputation -history of pancreatitis or pancreas problems -history of yeast infection of the penis or vagina -if you often drink alcohol -infections in the bladder, kidneys, or urinary tract -kidney disease -liver disease -low blood pressure -nerve damage -on hemodialysis -problems urinating -type 1 diabetes -uncircumcised male -an unusual or allergic reaction to canagliflozin, other medicines, foods, dyes, or preservatives -pregnant or trying to get pregnant -breast-feeding How should I use this medicine? Take this medicine by mouth with a glass of water. Follow the directions on the prescription label. Take it before the first meal of the day. Take your dose at the same time each day. Do not take more often than directed. Do not stop taking except on your doctor's advice. A special MedGuide will be given to you by the pharmacist with each prescription and refill. Be sure  to read this information carefully each time. Talk to your pediatrician regarding the use of this medicine in children. Special care may be needed. Overdosage: If you think you have taken too much of this medicine contact a poison control center or emergency room at once. NOTE: This medicine is only for you. Do not share this medicine with others. What if I miss a dose? If you miss a dose, take it as soon as you can. If it is almost time for your next dose, take only that dose. Do not take double or extra doses. What may interact with this medicine? Do not take this medicine with any of the following medications: -gatifloxacin This medicine may also interact with the following medications: -alcohol -certain medicines for blood pressure, heart disease -digoxin -diuretics -insulin -nateglinide -phenobarbital -phenytoin -repaglinide -rifampin -ritonavir -sulfonylureas like glimepiride, glipizide, glyburide This list may not describe all possible interactions. Give your health care provider a list of all the medicines, herbs, non-prescription drugs, or dietary supplements you use. Also tell them if you smoke, drink alcohol, or use illegal drugs. Some items may interact with your medicine. What should I watch for while using this medicine? Visit your doctor or health care professional for regular checks on your progress. This medicine can cause a serious condition in which there is too much acid in the blood. If you develop nausea, vomiting, stomach pain, unusual tiredness, or breathing problems, stop taking this medicine and call your doctor right away. If possible, use a ketone dipstick to check for ketones in your urine. A test called the HbA1C (A1C)  will be monitored. This is a simple blood test. It measures your blood sugar control over the last 2 to 3 months. You will receive this test every 3 to 6 months. Learn how to check your blood sugar. Learn the symptoms of low and high blood sugar  and how to manage them. Always carry a quick-source of sugar with you in case you have symptoms of low blood sugar. Examples include hard sugar candy or glucose tablets. Make sure others know that you can choke if you eat or drink when you develop serious symptoms of low blood sugar, such as seizures or unconsciousness. They must get medical help at once. Tell your doctor or health care professional if you have high blood sugar. You might need to change the dose of your medicine. If you are sick or exercising more than usual, you might need to change the dose of your medicine. Do not skip meals. Ask your doctor or health care professional if you should avoid alcohol. Many nonprescription cough and cold products contain sugar or alcohol. These can affect blood sugar. Wear a medical ID bracelet or chain, and carry a card that describes your disease and details of your medicine and dosage times. What side effects may I notice from receiving this medicine? Side effects that you should report to your doctor or health care professional as soon as possible: -allergic reactions like skin rash, itching or hives, swelling of the face, lips, or tongue -breathing problems -chest pain -dizziness -fast or irregular heartbeat -feeling faint or lightheaded, falls -muscle weakness -nausea, vomiting, unusual stomach upset or pain -new pain or tenderness, change in skin color, sores or ulcers, or infection in legs or feet -signs and symptoms of low blood sugar such as feeling anxious, confusion, dizziness, increased hunger, unusually weak or tired, sweating, shakiness, cold, irritable, headache, blurred vision, fast heartbeat, loss of consciousness -signs and symptoms of a urinary tract infection, such as fever, chills, a burning feeling when urinating, blood in the urine, back pain -trouble passing urine or change in the amount of urine, including an urgent need to urinate more often, in larger amounts, or at  night -penile discharge, itching, or pain in men -unusual tiredness -vaginal discharge, itching, or odor in women Side effects that usually do not require medical attention (report to your doctor or health care professional if they continue or are bothersome): -constipation -mild increase in urination -thirsty This list may not describe all possible side effects. Call your doctor for medical advice about side effects. You may report side effects to FDA at 1-800-FDA-1088. Where should I keep my medicine? Keep out of the reach of children. Store at room temperature between 20 and 25 degrees C (68 and 77 degrees F). Throw away any unused medicine after the expiration date. NOTE: This sheet is a summary. It may not cover all possible information. If you have questions about this medicine, talk to your doctor, pharmacist, or health care provider.  2018 Elsevier/Gold Standard (2015-10-18 14:17:20)

## 2018-04-08 NOTE — Progress Notes (Signed)
   CC: T2DM, HTN, CKD stage 3  HPI:  Mr.Caymen B Kissoon is a 62 y.o. male with PMH below.  Today we will address T2DM, HTN, CKD 3    Please see A&P for status of the patient's chronic medical conditions  Past Medical History:  Diagnosis Date  . Cardiomyopathy    Resolved with EF 55% 2011  . CARDIOMYOPATHY 04/22/2006  . CARDIOMYOPATHY 04/22/2006   2- D Echo (04/2010) : The EF is probably 55% with some hypokinesis at the base of the inferior wall. Wall thickness was increased in a pattern of mild LVH. Patient followed by Dr Martinique (Cardiology)     . Diabetes mellitus   . DIABETIC  RETINOPATHY 02/06/2007  . GERD (gastroesophageal reflux disease)   . Hyperlipemia   . Hypertension   . NEPHROPATHY, DIABETIC 06/08/2006  . Paroxysmal atrial tachycardia (HCC)    Review of Systems:  ROS: Pulmonary: pt denies increased work of breathing, shortness of breath,  Cardiac: pt denies palpitations, chest pain,  Abdominal: pt denies abdominal pain, nausea, vomiting, or diarrhea  Physical Exam:  Vitals:   04/08/18 0921  BP: (!) 153/68  Pulse: 100  Temp: 98.6 F (37 C)  TempSrc: Oral  SpO2: 100%  Weight: 295 lb 4.8 oz (133.9 kg)   Cardiac: normal rate and rhythm, clear s1 and s2 Pulmonary: CTAB, not in distress  Abdominal: non distended abdomen, soft and nontender Extremities: no LE edema Psych: Alert, conversant, in good spirits   Social History   Socioeconomic History  . Marital status: Single    Spouse name: Not on file  . Number of children: Not on file  . Years of education: Not on file  . Highest education level: Not on file  Occupational History  . Not on file  Social Needs  . Financial resource strain: Not on file  . Food insecurity:    Worry: Not on file    Inability: Not on file  . Transportation needs:    Medical: Not on file    Non-medical: Not on file  Tobacco Use  . Smoking status: Never Smoker  . Smokeless tobacco: Never Used  Substance and Sexual Activity    . Alcohol use: No    Alcohol/week: 0.0 standard drinks  . Drug use: No  . Sexual activity: Not on file  Lifestyle  . Physical activity:    Days per week: Not on file    Minutes per session: Not on file  . Stress: Not on file  Relationships  . Social connections:    Talks on phone: Not on file    Gets together: Not on file    Attends religious service: Not on file    Active member of club or organization: Not on file    Attends meetings of clubs or organizations: Not on file    Relationship status: Not on file  . Intimate partner violence:    Fear of current or ex partner: Not on file    Emotionally abused: Not on file    Physically abused: Not on file    Forced sexual activity: Not on file  Other Topics Concern  . Not on file  Social History Narrative  . Not on file    Family History  Problem Relation Age of Onset  . Diabetes Mother   . Diabetes Brother     Assessment & Plan:   See Encounters Tab for problem based charting.  Patient discussed with Dr. Lynnae January

## 2018-04-08 NOTE — Assessment & Plan Note (Addendum)
BP Readings from Last 3 Encounters:  04/08/18 (!) 153/68  01/29/18 (!) 152/76  12/26/17 (!) 177/83   Patient has had difficult to control HTN in the past.  His last bp reading was better but still not well controlled.  He currently takes lisinopril 40mg , diltiazem ER 180mg , carvedilol 25mg  BID, hydralazine 25mg  TID.   -will add invokana 150mg  daily today should get a good diuretic effect

## 2018-04-08 NOTE — Assessment & Plan Note (Addendum)
Our last visit patient ran out of meds and was severely uncontrolled, he worked with our pharmacist got access to his meds and his glucose was well controlled again a week later when seen in [facility].  Today his A1C is 7.5.  He is currently taking insulin 70/30 50 units BID, victoza 1.2mg , and metformin 1000mg  daily.  He was in a hurry and did not bring his medications or his meter today but denies lows at home, checks his blood sugar at least once daily.  Declined foot exam today but has had normal blood flow in the past on exam and normal ABI's a few years prior.  We discussed the risks and benefits of invokana including fournier's gangrene and which symptoms to look out for and report to me and when to discontinue the medicine.  Overall I believe the benefits in this particular far outweigh the risks.  He is an ideal candidate given his renal disease with proteinuria, diabetes and HTN.  His insurance coverage can be very finicky at times.  [name] is not on his formulary, invokana is covered by his insurance but only the 300mg  dosage for some reason.    -will start invokana at 1/2 tablet daily 150mg  daily, otherwise continue regimen above

## 2018-04-09 DIAGNOSIS — E611 Iron deficiency: Secondary | ICD-10-CM

## 2018-04-09 HISTORY — DX: Iron deficiency: E61.1

## 2018-04-09 LAB — PTH, INTACT AND CALCIUM
CALCIUM: 8.8 mg/dL (ref 8.6–10.2)
PTH: 103 pg/mL — ABNORMAL HIGH (ref 15–65)

## 2018-04-09 MED ORDER — FERROUS GLUCONATE 324 (38 FE) MG PO TABS: 324.0000 mg | ORAL_TABLET | Freq: Two times a day (BID) | ORAL | 0 refills | Status: DC

## 2018-04-09 MED ORDER — CALCITRIOL 0.25 MCG PO CAPS: 0.2500 ug | ORAL_CAPSULE | ORAL | 0 refills | Status: DC

## 2018-04-09 NOTE — Assessment & Plan Note (Signed)
This is secondary to patient's chronic kidney disease.  We will start patient on a trial of oral iron ferrous gluconate 324mg  BID WC see how he absorbs he may need IV iron in future.

## 2018-04-09 NOTE — Progress Notes (Signed)
Internal Medicine Clinic Attending  Case discussed with Dr. Winfrey  at the time of the visit.  We reviewed the resident's history and exam and pertinent patient test results.  I agree with the assessment, diagnosis, and plan of care documented in the resident's note.  

## 2018-04-09 NOTE — Assessment & Plan Note (Signed)
Repeated PTH this year, it was only slightly elevated last year.  I believe this is renal function continues to decline he is developing secondary hyperparathyroidism.  Calcium is still within the normal range although slightly lower than last year.  -We will begin treatment with calcitriol 0.25 every other day -Repeat PTH levels for improvement -If he tolerates calcitriol well and there is no hypercalcemia and his vitamin D level is persistently low, I will begin to replace the 25 vitamin D well

## 2018-04-10 LAB — RENAL FUNCTION PANEL
ALBUMIN: 3.7 g/dL (ref 3.6–4.8)
BUN/Creatinine Ratio: 11 (ref 10–24)
BUN: 26 mg/dL (ref 8–27)
CO2: 18 mmol/L — ABNORMAL LOW (ref 20–29)
CREATININE: 2.31 mg/dL — AB (ref 0.76–1.27)
Calcium: 9 mg/dL (ref 8.6–10.2)
Chloride: 108 mmol/L — ABNORMAL HIGH (ref 96–106)
GFR, EST AFRICAN AMERICAN: 33 mL/min/{1.73_m2} — AB (ref >59)
GFR, EST NON AFRICAN AMERICAN: 29 mL/min/{1.73_m2} — AB (ref >59)
Glucose: 66 mg/dL (ref 65–99)
Phosphorus: 2.7 mg/dL (ref 2.5–4.5)
Potassium: 4.6 mmol/L (ref 3.5–5.2)
Sodium: 145 mmol/L — ABNORMAL HIGH (ref 134–144)

## 2018-04-10 LAB — TRANSFERRIN SATURATION
IRON SATN MFR SERPL: 19 % Saturation
IRON SERPL-MCNC: 72 ug/dL
TRANSFERRIN SERPL-MCNC: 266 mg/dL

## 2018-04-10 LAB — CBC
HEMATOCRIT: 38.9 % (ref 37.5–51.0)
HEMOGLOBIN: 13 g/dL (ref 13.0–17.7)
MCH: 28.6 pg (ref 26.6–33.0)
MCHC: 33.4 g/dL (ref 31.5–35.7)
MCV: 86 fL (ref 79–97)
Platelets: 218 10*3/uL (ref 150–450)
RBC: 4.54 x10E6/uL (ref 4.14–5.80)
RDW: 13.4 % (ref 12.3–15.4)
WBC: 8.1 10*3/uL (ref 3.4–10.8)

## 2018-04-10 LAB — VITAMIN D 25 HYDROXY (VIT D DEFICIENCY, FRACTURES): Vit D, 25-Hydroxy: 13.5 ng/mL — ABNORMAL LOW (ref 30.0–100.0)

## 2018-04-10 LAB — FERRITIN: FERRITIN: 63 ng/mL (ref 30–400)

## 2018-04-15 ENCOUNTER — Other Ambulatory Visit: Payer: Self-pay | Admitting: Internal Medicine

## 2018-04-15 DIAGNOSIS — N183 Chronic kidney disease, stage 3 unspecified: Secondary | ICD-10-CM

## 2018-04-15 MED ORDER — CHOLECALCIFEROL 25 MCG (1000 UT) PO CAPS: 2000.0000 [IU] | ORAL_CAPSULE | Freq: Every day | ORAL | 1 refills | Status: DC

## 2018-04-18 ENCOUNTER — Ambulatory Visit: Payer: BLUE CROSS/BLUE SHIELD | Admitting: Pharmacist

## 2018-04-18 ENCOUNTER — Telehealth: Payer: Self-pay | Admitting: Internal Medicine

## 2018-04-18 VITALS — BP 138/66 | HR 71

## 2018-04-18 DIAGNOSIS — Z79899 Other long term (current) drug therapy: Secondary | ICD-10-CM

## 2018-04-18 NOTE — Patient Instructions (Signed)
It was a pleasure seeing you today, [name]. Thank you for bringing in all of your medications today. We talked about how you are taking each medication and why you're taking each medication. We discussed the important of taking your blood pressure medications to reduce your risk of stroke, heart attack, and worsening kidney function. As discussed, I will talk to your doctor and reach out soon to talk about the medications you weren't sure if you were still supposed to be taking. I will also work on ways to make your new ferrous gluconate prescription more affordable. Please reach out if you have any questions!

## 2018-04-18 NOTE — Telephone Encounter (Signed)
I attempted to call [name] this afternoon but it went to voicemail.  Unfortunately, [name] current medical conditions make him virtually uninsurable.

## 2018-04-18 NOTE — Telephone Encounter (Signed)
Patient was into to see the pharmacist, but had question about diagnosis listed on his last AVS of    CKD (chronic kidney disease) stage 3, GFR 30-59 ml/min ([place])   He is trying to get life insurance and that diagnosis is causing him to be denied. The patient would like you to call and speak with Ruben Im at [phone] about this.  He signed a ROI and it has been scanned into his chart.

## 2018-04-18 NOTE — Progress Notes (Signed)
Today, [name] was seen in clinic for a medication reconciliation. He is a 62 y.o. black male with a PMH of T2DM, HTN, CKD, GERD, diabetic retinopathy, osteoarthritis, and hyperparathyroidism.   [name] came with his wife today and did bring all of his medications with him. We went through each of his medications, why he takes them, how he takes them, and if he has any barriers to medication access.   Patient was recently seen in clinic for management of his diabetes, HTN, and hyperparathyroidism. He should be on Lisinopril 40 mg daily, Carvedilol 25 mg BID, hydralazine 25 mg TID, and diltiazem ER 180 mg daily. At the last visit, patient was started on Invokana for HTN, DM, and CKD.  Patient has not been taking his lisinopril for the past month, hasn't filled his hydralazine since 03/19, and isn't sure when he stopped taking the diltiazem. He did not bring in the bottle and didn't seem familiar with the medication. He is also not taking his atorvastatin and isn't sure when he stopped taking. He is also not taking the vitamin D that was prescribed at the last visit.   Finally, his recent prescription for the ferrous gluconate cost him $120. I am working on ways to make this medication more accessible to him, as he won't be able to afford it on a regular basis and is forgoing picking up his other medications to afford this one.   We also discussed the important of taking his blood pressure medications to reduce risk of stroke, heart attack, and worsening kidney function. He does have a pill box at home but doesn't use it, and says he remembers to take his medications if he remembers to fill them. Wife wants to switch pharmacies to [facility] because they have an auto-fill system and will call each month, but [name] prefers [facility].   Kathyrn Sheriff Student PharmD

## 2018-04-22 ENCOUNTER — Telehealth: Payer: Self-pay

## 2018-04-23 NOTE — Telephone Encounter (Signed)
Mr. Kilburg called to return my follow-up call from earlier this week. He was seen on 04/18/2018 for a medication reconciliation and many medication adherence issues were identified.   During this call, he was instructed to restart Lisinopril 40 mg once daily, continue carvedilol 25 mg twice daily, and stop Invokana 300 mg 1/2 tablet once daily. He was also instructed to start cholecalciferol 2000 units once daily and increase his omega 3 fatty acids to 2000 mg once daily. He was instructed to not restart his hydralazine or diltiazem at this time.   We scheduled a follow-up phone call for 04/29/18 and a follow-up in clinic for 05/06/18.   Patient verbalized understanding and did not have any further questions.  Kathyrn Sheriff  PharmD Student

## 2018-04-28 NOTE — Progress Notes (Signed)
Patient was seen in clinic with Kathyrn Sheriff, PharmD candidate. I agree with the assessment and plan of care documented.

## 2018-05-06 ENCOUNTER — Ambulatory Visit (INDEPENDENT_AMBULATORY_CARE_PROVIDER_SITE_OTHER): Payer: BLUE CROSS/BLUE SHIELD | Admitting: Pharmacist

## 2018-05-06 VITALS — BP 143/78 | HR 66

## 2018-05-06 DIAGNOSIS — E785 Hyperlipidemia, unspecified: Secondary | ICD-10-CM

## 2018-05-06 DIAGNOSIS — I10 Essential (primary) hypertension: Secondary | ICD-10-CM

## 2018-05-06 DIAGNOSIS — E1165 Type 2 diabetes mellitus with hyperglycemia: Secondary | ICD-10-CM

## 2018-05-06 DIAGNOSIS — E118 Type 2 diabetes mellitus with unspecified complications: Secondary | ICD-10-CM

## 2018-05-06 DIAGNOSIS — Z794 Long term (current) use of insulin: Secondary | ICD-10-CM | POA: Diagnosis not present

## 2018-05-06 DIAGNOSIS — IMO0002 Reserved for concepts with insufficient information to code with codable children: Secondary | ICD-10-CM

## 2018-05-06 MED ORDER — METFORMIN HCL 500 MG PO TABS: 500.0000 mg | ORAL_TABLET | Freq: Two times a day (BID) | ORAL | 3 refills | Status: DC

## 2018-05-06 MED ORDER — LISINOPRIL 40 MG PO TABS: 40.0000 mg | ORAL_TABLET | Freq: Every day | ORAL | 3 refills | Status: DC

## 2018-05-06 MED ORDER — ATORVASTATIN CALCIUM 80 MG PO TABS: 80.0000 mg | ORAL_TABLET | Freq: Every day | ORAL | 3 refills | Status: DC

## 2018-05-06 MED ORDER — CARVEDILOL 25 MG PO TABS: 25.0000 mg | ORAL_TABLET | Freq: Two times a day (BID) | ORAL | 3 refills | Status: DC

## 2018-05-06 NOTE — Patient Instructions (Signed)
Please record the time, amount and what food drinks and activities you have while wearing the continuous glucose monitor(CGM) in the folder provided.  Bring the folder with you to follow up appointments  Do not have a CT or an MRI while wearing the CGM.   Please make an appointment for 1 week with me and a doctor for the first of two CGM downloads..   You will also return in 2 weeks to have your second download and the CGM removed.  

## 2018-05-06 NOTE — Progress Notes (Addendum)
S: [name] is a 62 y.o. male reports to clinical pharmacist appointment for follow up per Dr. Shan Levans consult.  No Known Allergies Medication Sig  acetaminophen (TYLENOL) 500 MG tablet Take 500 mg by mouth every 6 (six) hours as needed.    aspirin 81 MG tablet Take 1 tablet (81 mg total) by mouth daily.  atorvastatin (LIPITOR) 80 MG tablet Take 1 tablet (80 mg total) by mouth daily.  BAYER MICROLET LANCETS lancets Check blood sugar 3 times a day as instructed  Blood Glucose Monitoring Suppl (ONE TOUCH ULTRA MINI) w/Device KIT 1 strip by Other route 3 (three) times daily.  carvedilol (COREG) 25 MG tablet Take 1 tablet (25 mg total) by mouth 2 (two) times daily with a meal.  Cholecalciferol 25 MCG (1000 UT) capsule Take 2 capsules (2,000 Units total) by mouth daily. Patient not taking: Reported on 04/18/2018  diltiazem (CARDIZEM CD) 180 MG 24 hr capsule Take 1 capsule (180 mg total) by mouth daily.  ferrous gluconate (FERGON) 324 MG tablet Take 1 tablet (324 mg total) by mouth 2 (two) times daily with a meal.  Glucosamine-Chondroit-Vit C-Mn (GLUCOSAMINE 1500 COMPLEX PO) Take 1,500 mg by mouth daily.  Glucosamine-Chondroitin (OSTEO BI-FLEX REGULAR STRENGTH) 250-200 MG TABS Take 4 caplets per day with food   glucose blood (BAYER CONTOUR NEXT TEST) test strip Check blood sugar 3 times a day as instructed  hydrALAZINE (APRESOLINE) 25 MG tablet Take 1 tablet (25 mg total) by mouth 3 (three) times daily.  insulin aspart protamine - aspart (NOVOLOG MIX 70/30 FLEXPEN) (70-30) 100 UNIT/ML FlexPen Inject 60U into the skin in the morning with breakfast and in the evening 50U with dinner Patient taking differently: 50 Units 2 (two) times daily with a meal. Inject 60U into the skin in the morning with breakfast and in the evening 50U with dinner  Insulin Pen Needle 31G X 6 MM MISC 1 Stick by Other route 3 (three) times daily. Use with insulin and liraglutide.  liraglutide (VICTOZA) 18 MG/3ML SOPN  Inject 0.2 mLs (1.2 mg total) into the skin daily. Patient taking differently: Inject 1.8 mg into the skin daily.   lisinopril (PRINIVIL,ZESTRIL) 40 MG tablet Take 1 tablet (40 mg total) by mouth daily.  metFORMIN (GLUCOPHAGE) 500 MG tablet Take 1 tablet (500 mg total) by mouth 2 (two) times daily with a meal.  Omega-3 Fatty Acids (FISH OIL) 1200 MG CAPS Take 2 capsules (2,400 mg total) by mouth daily.  vitamin B-12 (CYANOCOBALAMIN) 1000 MCG tablet Take 1,000 mcg by mouth daily.   Past Medical History:  Diagnosis Date  . Cardiomyopathy    Resolved with EF 55% 2011  . CARDIOMYOPATHY 04/22/2006  . CARDIOMYOPATHY 04/22/2006   2- D Echo (04/2010) : The EF is probably 55% with some hypokinesis at the base of the inferior wall. Wall thickness was increased in a pattern of mild LVH. Patient followed by Dr Martinique (Cardiology)     . Diabetes mellitus   . DIABETIC  RETINOPATHY 02/06/2007  . GERD (gastroesophageal reflux disease)   . Hyperlipemia   . Hypertension   . NEPHROPATHY, DIABETIC 06/08/2006  . Paroxysmal atrial tachycardia (HCC)    Social History   Socioeconomic History  . Marital status: Single    Spouse name: Not on file  . Number of children: Not on file  . Years of education: Not on file  . Highest education level: Not on file  Occupational History  . Not on file  Social Needs  .  Financial resource strain: Not on file  . Food insecurity:    Worry: Not on file    Inability: Not on file  . Transportation needs:    Medical: Not on file    Non-medical: Not on file  Tobacco Use  . Smoking status: Never Smoker  . Smokeless tobacco: Never Used  Substance and Sexual Activity  . Alcohol use: No    Alcohol/week: 0.0 standard drinks  . Drug use: No  . Sexual activity: Not on file  Lifestyle  . Physical activity:    Days per week: Not on file    Minutes per session: Not on file  . Stress: Not on file  Relationships  . Social connections:    Talks on phone: Not on file     Gets together: Not on file    Attends religious service: Not on file    Active member of club or organization: Not on file    Attends meetings of clubs or organizations: Not on file    Relationship status: Not on file  Other Topics Concern  . Not on file  Social History Narrative  . Not on file   Family History  Problem Relation Age of Onset  . Diabetes Mother   . Diabetes Brother    O:    Component Value Date/Time   CHOL 181 10/03/2017 1435   HDL 46 10/03/2017 1435   TRIG 248 (H) 10/03/2017 1435   AST 12 04/15/2017 1442   ALT 12 04/15/2017 1442   NA 145 (H) 04/08/2018 1012   K 4.6 04/08/2018 1012   CL 108 (H) 04/08/2018 1012   CO2 18 (L) 04/08/2018 1012   GLUCOSE 66 04/08/2018 1012   GLUCOSE 350 (H) 06/14/2014 1402   HGBA1C 7.5 (A) 04/08/2018 0920   HGBA1C 9.2 05/05/2010 1105   BUN 26 04/08/2018 1012   CREATININE 2.31 (H) 04/08/2018 1012   CREATININE 1.93 (H) 06/14/2014 1402   CALCIUM 8.8 04/08/2018 1012   CALCIUM 9.0 04/08/2018 1012   GFRNONAA 29 (L) 04/08/2018 1012   GFRNONAA 37 (L) 06/14/2014 1402   GFRAA 33 (L) 04/08/2018 1012   GFRAA 43 (L) 06/14/2014 1402   WBC 8.1 04/08/2018 1012   WBC 10.2 05/24/2014 1625   HGB 13.0 04/08/2018 1012   HCT 38.9 04/08/2018 1012   PLT 218 04/08/2018 1012   TSH 1.194 03/24/2009 0151   Ht Readings from Last 2 Encounters:  10/17/17 5' 11"  (1.803 m)  10/03/17 5' 11"  (1.803 m)   Wt Readings from Last 2 Encounters:  04/08/18 295 lb 4.8 oz (133.9 kg)  10/17/17 294 lb (133.4 kg)   There is no height or weight on file to calculate BMI. BP Readings from Last 3 Encounters:  05/06/18 (!) 143/78  04/18/18 138/66  04/08/18 (!) 153/68   A/P: Patient did bring medication bottles to clinic today. Notable findings: Not taking canagliflozin, diltiazem, or hydralazine. Other medications up-to-date as listed. Patient re-started lisinopril and vitamin D, but not yet atorvastatin. He requested transfer of prescriptions to  [facility]---prescriptions sent. BP slightly elevated today, will order labs and follow up next week, no medication changes made today.   Patient did not bring BG meter today but requested clinic CGM per Dr. Shan Levans approval.   Documentation for [facility] Continuous glucose monitoring Freestyle Libre Pro CGM sensor placed today. Patient was educated about wearing sensor, keeping food, activity and medication log and when to call office. Patient was educated about how to care for the  sensor and not to have an MRI, CT or Diathermy while wearing the sensor. Follow up was arranged with the patient for 1 week.   Lot #: [id number] Serial #: [id number] Expiration Date: 10/02/18  Flossie Dibble, PharmD 05/06/2018 10:49 AM.  Labs ordered today per Dr. Shan Levans: BMET, urine microalb/cr, vitamin B12  An after visit summary was provided and patient advised to follow up in 1 week or sooner if any changes in condition or questions regarding medications arise.   The patient verbalized understanding of information provided by repeating back concepts discussed.

## 2018-05-07 LAB — BMP8+ANION GAP
Anion Gap: 16 mmol/L (ref 10.0–18.0)
BUN / CREAT RATIO: 18 (ref 10–24)
BUN: 39 mg/dL — AB (ref 8–27)
CO2: 18 mmol/L — ABNORMAL LOW (ref 20–29)
CREATININE: 2.11 mg/dL — AB (ref 0.76–1.27)
Calcium: 9.1 mg/dL (ref 8.6–10.2)
Chloride: 109 mmol/L — ABNORMAL HIGH (ref 96–106)
GFR, EST AFRICAN AMERICAN: 37 mL/min/{1.73_m2} — AB (ref >59)
GFR, EST NON AFRICAN AMERICAN: 32 mL/min/{1.73_m2} — AB (ref >59)
Glucose: 55 mg/dL — ABNORMAL LOW (ref 65–99)
Potassium: 4.5 mmol/L (ref 3.5–5.2)
SODIUM: 143 mmol/L (ref 134–144)

## 2018-05-07 LAB — VITAMIN B12: Vitamin B-12: 1613 pg/mL — ABNORMAL HIGH (ref 232–1245)

## 2018-05-07 LAB — MICROALBUMIN / CREATININE URINE RATIO
Creatinine, Urine: 187 mg/dL
Microalb/Creat Ratio: 3169 mg/g creat — ABNORMAL HIGH (ref 0.0–30.0)
Microalbumin, Urine: 5926.1 ug/mL

## 2018-05-16 ENCOUNTER — Ambulatory Visit: Payer: BLUE CROSS/BLUE SHIELD | Admitting: Pharmacist

## 2018-05-16 ENCOUNTER — Ambulatory Visit (INDEPENDENT_AMBULATORY_CARE_PROVIDER_SITE_OTHER): Payer: BLUE CROSS/BLUE SHIELD | Admitting: Internal Medicine

## 2018-05-16 ENCOUNTER — Other Ambulatory Visit: Payer: Self-pay

## 2018-05-16 ENCOUNTER — Encounter: Payer: Self-pay | Admitting: Internal Medicine

## 2018-05-16 VITALS — BP 160/82 | HR 70 | Temp 97.5°F | Ht 71.0 in | Wt 296.2 lb

## 2018-05-16 VITALS — BP 154/81 | HR 68

## 2018-05-16 DIAGNOSIS — IMO0002 Reserved for concepts with insufficient information to code with codable children: Secondary | ICD-10-CM

## 2018-05-16 DIAGNOSIS — I10 Essential (primary) hypertension: Secondary | ICD-10-CM | POA: Diagnosis not present

## 2018-05-16 DIAGNOSIS — E118 Type 2 diabetes mellitus with unspecified complications: Secondary | ICD-10-CM | POA: Diagnosis not present

## 2018-05-16 DIAGNOSIS — Z79899 Other long term (current) drug therapy: Secondary | ICD-10-CM | POA: Diagnosis not present

## 2018-05-16 DIAGNOSIS — Z794 Long term (current) use of insulin: Secondary | ICD-10-CM

## 2018-05-16 DIAGNOSIS — E1165 Type 2 diabetes mellitus with hyperglycemia: Secondary | ICD-10-CM | POA: Diagnosis not present

## 2018-05-16 MED ORDER — GLUCOSE BLOOD VI STRP: ORAL_STRIP | 12 refills | Status: AC

## 2018-05-16 MED ORDER — GLUCOSE BLOOD VI STRP: ORAL_STRIP | 12 refills | Status: DC

## 2018-05-16 MED ORDER — HYDRALAZINE HCL 25 MG PO TABS: 25.0000 mg | ORAL_TABLET | Freq: Three times a day (TID) | ORAL | 3 refills | Status: DC

## 2018-05-16 NOTE — Assessment & Plan Note (Signed)
Patient presented for continued evaluation and management of his uncontrolled diabetes. Most recent A1c is 7.5. He is currently on NovoLog 70/30 50 units BID, Victoza a 1.8 mg q.d., and metformin 1000 mg daily. Although he was previously prescribed Invokana the patient never started it. According to his CGM the patient is experiencing hypoglycemia approximately 5% of the time. These episodes of hypoglycemia occur sporadically including the late afternoon. In coordination with pharmacy we have elected to decrease his NovoLog 70/30 to 48 units BID and continue all other medications as prescribed. Follow-up in one week for further CGM evaluation.

## 2018-05-16 NOTE — Progress Notes (Signed)
S: [name] is a 62 y.o. male reports to appointment for CGM follow up week #1.  No Known Allergies Medication Sig  acetaminophen (TYLENOL) 500 MG tablet Take 500 mg by mouth every 6 (six) hours as needed.    aspirin 81 MG tablet Take 1 tablet (81 mg total) by mouth daily.  atorvastatin (LIPITOR) 80 MG tablet Take 1 tablet (80 mg total) by mouth daily.  BAYER MICROLET LANCETS lancets Check blood sugar 3 times a day as instructed  Blood Glucose Monitoring Suppl (ONE TOUCH ULTRA MINI) w/Device KIT 1 strip by Other route 3 (three) times daily.  carvedilol (COREG) 25 MG tablet Take 1 tablet (25 mg total) by mouth 2 (two) times daily with a meal.  Cholecalciferol 25 MCG (1000 UT) capsule Take 2 capsules (2,000 Units total) by mouth daily. Patient not taking: Reported on 04/18/2018  ferrous gluconate (FERGON) 324 MG tablet Take 1 tablet (324 mg total) by mouth 2 (two) times daily with a meal.  Glucosamine-Chondroit-Vit C-Mn (GLUCOSAMINE 1500 COMPLEX PO) Take 1,500 mg by mouth daily.  Glucosamine-Chondroitin (OSTEO BI-FLEX REGULAR STRENGTH) 250-200 MG TABS Take 4 caplets per day with food   glucose blood (BAYER CONTOUR NEXT TEST) test strip Check blood sugar 3 times a day as instructed  insulin aspart protamine - aspart (NOVOLOG MIX 70/30 FLEXPEN) (70-30) 100 UNIT/ML FlexPen Inject 60U into the skin in the morning with breakfast and in the evening 50U with dinner Patient taking differently: 48 Units 2 (two) times daily with a meal. Inject 60U into the skin in the morning with breakfast and in the evening 50U with dinner  Insulin Pen Needle 31G X 6 MM MISC 1 Stick by Other route 3 (three) times daily. Use with insulin and liraglutide.  liraglutide (VICTOZA) 18 MG/3ML SOPN Inject 0.2 mLs (1.2 mg total) into the skin daily. Patient taking differently: Inject 1.8 mg into the skin daily.   lisinopril (PRINIVIL,ZESTRIL) 40 MG tablet Take 1 tablet (40 mg total) by mouth daily.  metFORMIN (GLUCOPHAGE)  500 MG tablet Take 1 tablet (500 mg total) by mouth 2 (two) times daily with a meal.  Omega-3 Fatty Acids (FISH OIL) 1200 MG CAPS Take 2 capsules (2,400 mg total) by mouth daily.  vitamin B-12 (CYANOCOBALAMIN) 1000 MCG tablet Take 1,000 mcg by mouth daily.   Past Medical History:  Diagnosis Date  . Cardiomyopathy    Resolved with EF 55% 2011  . CARDIOMYOPATHY 04/22/2006  . CARDIOMYOPATHY 04/22/2006   2- D Echo (04/2010) : The EF is probably 55% with some hypokinesis at the base of the inferior wall. Wall thickness was increased in a pattern of mild LVH. Patient followed by Dr Martinique (Cardiology)     . Diabetes mellitus   . DIABETIC  RETINOPATHY 02/06/2007  . GERD (gastroesophageal reflux disease)   . Hyperlipemia   . Hypertension   . NEPHROPATHY, DIABETIC 06/08/2006  . Paroxysmal atrial tachycardia (HCC)    Social History   Socioeconomic History  . Marital status: Single    Spouse name: Not on file  . Number of children: Not on file  . Years of education: Not on file  . Highest education level: Not on file  Occupational History  . Not on file  Social Needs  . Financial resource strain: Not on file  . Food insecurity:    Worry: Not on file    Inability: Not on file  . Transportation needs:    Medical: Not on file    Non-medical:  Not on file  Tobacco Use  . Smoking status: Never Smoker  . Smokeless tobacco: Never Used  Substance and Sexual Activity  . Alcohol use: No    Alcohol/week: 0.0 standard drinks  . Drug use: No  . Sexual activity: Not on file  Lifestyle  . Physical activity:    Days per week: Not on file    Minutes per session: Not on file  . Stress: Not on file  Relationships  . Social connections:    Talks on phone: Not on file    Gets together: Not on file    Attends religious service: Not on file    Active member of club or organization: Not on file    Attends meetings of clubs or organizations: Not on file    Relationship status: Not on file  Other  Topics Concern  . Not on file  Social History Narrative  . Not on file   Family History  Problem Relation Age of Onset  . Diabetes Mother   . Diabetes Brother    O: Component Value Date/Time   CHOL 181 10/03/2017 1435   HDL 46 10/03/2017 1435   LDLDIRECT 247.6 06/18/2007 1000   LDLCALC 85 10/03/2017 1435   TRIG 248 (H) 10/03/2017 1435   GLUCOSE 55 (L) 05/06/2018 1038   GLUCOSE 350 (H) 06/14/2014 1402   HGBA1C 7.5 (A) 04/08/2018 0920   HGBA1C 9.2 05/05/2010 1105   NA 143 05/06/2018 1038   K 4.5 05/06/2018 1038   CL 109 (H) 05/06/2018 1038   CO2 18 (L) 05/06/2018 1038   BUN 39 (H) 05/06/2018 1038   CREATININE 2.11 (H) 05/06/2018 1038   CREATININE 1.93 (H) 06/14/2014 1402   CALCIUM 9.1 05/06/2018 1038   GFRNONAA 32 (L) 05/06/2018 1038   GFRNONAA 37 (L) 06/14/2014 1402   GFRAA 37 (L) 05/06/2018 1038   GFRAA 43 (L) 06/14/2014 1402   AST 12 04/15/2017 1442   ALT 12 04/15/2017 1442   WBC 8.1 04/08/2018 1012   WBC 10.2 05/24/2014 1625   HGB 13.0 04/08/2018 1012   HCT 38.9 04/08/2018 1012   PLT 218 04/08/2018 1012   TSH 1.194 03/24/2009 0151   Ht Readings from Last 2 Encounters:  05/16/18 _0  (1.803 m)  10/17/17 _1  (1.803 m)   Wt Readings from Last 2 Encounters:  05/16/18 296 lb 3.2 oz (134.4 kg)  04/08/18 295 lb 4.8 oz (133.9 kg)   There is no height or weight on file to calculate BMI. BP Readings from Last 3 Encounters:  05/16/18 (!) 160/82  05/16/18 (!) 154/81  05/06/18 (!) 143/78    A/P: Patient was seen today in a co-visit with Dr. Tarri Abernethy. See documentation under Dr. Jerrell Mylar visit for details.

## 2018-05-16 NOTE — Assessment & Plan Note (Signed)
Patient with uncontrolled hypertension. He is supposed to be on lisinopril 40 mg, diltiazem ER 180 mg, carvedilol 25 mg BID, and hydralazine 25 mg TID. At his last pharmacy visit it was noted that he is not taking the hydralazine or diltiazem and he had just restarted the lisinopril. Blood pressure today remains uncontrolled. Per his PCPs plan we will restart his hydralazine 25 mg TID today.

## 2018-05-16 NOTE — Addendum Note (Signed)
Addended by: Forde Dandy on: 05/16/2018 04:31 PM   Modules accepted: Orders

## 2018-05-16 NOTE — Patient Instructions (Addendum)
Patient educated about medication as defined in this encounter and verbalized understanding by repeating back instructions provided.   Thank you for allowing [place] to provide your care. We're decreasing your insulin to 48 units twice daily and restarting you on a medication called hydralazine. Please continue all other medications as prescribed. We will see you back in one week to see how you sugars are doing. Please let us know if you have any questions or concerns.  I hope that you have a Merry Christmas and a happy New Year's.

## 2018-05-16 NOTE — Progress Notes (Signed)
   CC: F/U DM and HTN  HPI:  [name] is a 62 y.o. male who presented to the clinic for continued evaluation and management of his chronic medical illnesses. For a detailed assessment and plan please refer to problem based charting below.   Past Medical History:  Diagnosis Date  . Cardiomyopathy    Resolved with EF 55% 2011  . CARDIOMYOPATHY 04/22/2006  . CARDIOMYOPATHY 04/22/2006   2- D Echo (04/2010) : The EF is probably 55% with some hypokinesis at the base of the inferior wall. Wall thickness was increased in a pattern of mild LVH. Patient followed by Dr Martinique (Cardiology)     . Diabetes mellitus   . DIABETIC  RETINOPATHY 02/06/2007  . GERD (gastroesophageal reflux disease)   . Hyperlipemia   . Hypertension   . NEPHROPATHY, DIABETIC 06/08/2006  . Paroxysmal atrial tachycardia (HCC)    Review of Systems:  12 point ROS preformed. All negative aside from those mentioned in the HPI.  Physical Exam: Vitals:   05/16/18 1036  BP: (!) 160/82  Pulse: 70  Temp: (!) 97.5 F (36.4 C)  TempSrc: Oral  SpO2: 99%  Weight: 296 lb 3.2 oz (134.4 kg)  Height: 5\' 11"  (1.803 m)   General: Obese male in no acute distress Pulm: Good air movement with no wheezing or crackles  CV: RRR, no murmurs, no rubs  Extremities: Pulses palpable in all extremities, moderate pitting edema LE edema bilaterally  Assessment & Plan:   See Encounters Tab for problem based charting.  Patient discussed with Dr. Angelia Mould

## 2018-05-16 NOTE — Progress Notes (Addendum)
S: [name] is a 62 y.o. male reports to appointment for CGM download week #1.  No Known Allergies Medication Sig  acetaminophen (TYLENOL) 500 MG tablet Take 500 mg by mouth every 6 (six) hours as needed.    aspirin 81 MG tablet Take 1 tablet (81 mg total) by mouth daily.  atorvastatin (LIPITOR) 80 MG tablet Take 1 tablet (80 mg total) by mouth daily.  BAYER MICROLET LANCETS lancets Check blood sugar 3 times a day as instructed  Blood Glucose Monitoring Suppl (ONE TOUCH ULTRA MINI) w/Device KIT 1 strip by Other route 3 (three) times daily.  carvedilol (COREG) 25 MG tablet Take 1 tablet (25 mg total) by mouth 2 (two) times daily with a meal.  Cholecalciferol 25 MCG (1000 UT) capsule Take 2 capsules (2,000 Units total) by mouth daily. Patient not taking: Reported on 04/18/2018  ferrous gluconate (FERGON) 324 MG tablet Take 1 tablet (324 mg total) by mouth 2 (two) times daily with a meal.  Glucosamine-Chondroit-Vit C-Mn (GLUCOSAMINE 1500 COMPLEX PO) Take 1,500 mg by mouth daily.  Glucosamine-Chondroitin (OSTEO BI-FLEX REGULAR STRENGTH) 250-200 MG TABS Take 4 caplets per day with food   glucose blood (BAYER CONTOUR NEXT TEST) test strip Check blood sugar 3 times a day as instructed  insulin aspart protamine - aspart (NOVOLOG MIX 70/30 FLEXPEN) (70-30) 100 UNIT/ML FlexPen Inject 60U into the skin in the morning with breakfast and in the evening 50U with dinner Patient taking differently: 48 Units 2 (two) times daily with a meal. Inject 60U into the skin in the morning with breakfast and in the evening 50U with dinner  Insulin Pen Needle 31G X 6 MM MISC 1 Stick by Other route 3 (three) times daily. Use with insulin and liraglutide.  liraglutide (VICTOZA) 18 MG/3ML SOPN Inject 0.2 mLs (1.2 mg total) into the skin daily. Patient taking differently: Inject 1.8 mg into the skin daily.   lisinopril (PRINIVIL,ZESTRIL) 40 MG tablet Take 1 tablet (40 mg total) by mouth daily.  metFORMIN (GLUCOPHAGE)  500 MG tablet Take 1 tablet (500 mg total) by mouth 2 (two) times daily with a meal.  Omega-3 Fatty Acids (FISH OIL) 1200 MG CAPS Take 2 capsules (2,400 mg total) by mouth daily.  vitamin B-12 (CYANOCOBALAMIN) 1000 MCG tablet Take 1,000 mcg by mouth daily.   Past Medical History:  Diagnosis Date  . Cardiomyopathy    Resolved with EF 55% 2011  . CARDIOMYOPATHY 04/22/2006  . CARDIOMYOPATHY 04/22/2006   2- D Echo (04/2010) : The EF is probably 55% with some hypokinesis at the base of the inferior wall. Wall thickness was increased in a pattern of mild LVH. Patient followed by Dr Martinique (Cardiology)     . Diabetes mellitus   . DIABETIC  RETINOPATHY 02/06/2007  . GERD (gastroesophageal reflux disease)   . Hyperlipemia   . Hypertension   . NEPHROPATHY, DIABETIC 06/08/2006  . Paroxysmal atrial tachycardia (HCC)    Social History   Socioeconomic History  . Marital status: Single    Spouse name: Not on file  . Number of children: Not on file  . Years of education: Not on file  . Highest education level: Not on file  Occupational History  . Not on file  Social Needs  . Financial resource strain: Not on file  . Food insecurity:    Worry: Not on file    Inability: Not on file  . Transportation needs:    Medical: Not on file    Non-medical: Not  on file  Tobacco Use  . Smoking status: Never Smoker  . Smokeless tobacco: Never Used  Substance and Sexual Activity  . Alcohol use: No    Alcohol/week: 0.0 standard drinks  . Drug use: No  . Sexual activity: Not on file  Lifestyle  . Physical activity:    Days per week: Not on file    Minutes per session: Not on file  . Stress: Not on file  Relationships  . Social connections:    Talks on phone: Not on file    Gets together: Not on file    Attends religious service: Not on file    Active member of club or organization: Not on file    Attends meetings of clubs or organizations: Not on file    Relationship status: Not on file  Other  Topics Concern  . Not on file  Social History Narrative  . Not on file   Family History  Problem Relation Age of Onset  . Diabetes Mother   . Diabetes Brother    O: Component Value Date/Time   CHOL 181 10/03/2017 1435   HDL 46 10/03/2017 1435   LDLDIRECT 247.6 06/18/2007 1000   LDLCALC 85 10/03/2017 1435   TRIG 248 (H) 10/03/2017 1435   GLUCOSE 55 (L) 05/06/2018 1038   GLUCOSE 350 (H) 06/14/2014 1402   HGBA1C 7.5 (A) 04/08/2018 0920   HGBA1C 9.2 05/05/2010 1105   NA 143 05/06/2018 1038   K 4.5 05/06/2018 1038   CL 109 (H) 05/06/2018 1038   CO2 18 (L) 05/06/2018 1038   BUN 39 (H) 05/06/2018 1038   CREATININE 2.11 (H) 05/06/2018 1038   CREATININE 1.93 (H) 06/14/2014 1402   CALCIUM 9.1 05/06/2018 1038   GFRNONAA 32 (L) 05/06/2018 1038   GFRNONAA 37 (L) 06/14/2014 1402   GFRAA 37 (L) 05/06/2018 1038   GFRAA 43 (L) 06/14/2014 1402   AST 12 04/15/2017 1442   ALT 12 04/15/2017 1442   WBC 8.1 04/08/2018 1012   WBC 10.2 05/24/2014 1625   HGB 13.0 04/08/2018 1012   HCT 38.9 04/08/2018 1012   PLT 218 04/08/2018 1012   TSH 1.194 03/24/2009 0151   Ht Readings from Last 2 Encounters:  05/16/18 5' 11"  (1.803 m)  10/17/17 5' 11"  (1.803 m)   Wt Readings from Last 2 Encounters:  05/16/18 296 lb 3.2 oz (134.4 kg)  04/08/18 295 lb 4.8 oz (133.9 kg)   Body mass index is 41.31 kg/m. BP Readings from Last 3 Encounters:  05/16/18 (!) 160/82  05/16/18 (!) 154/81  05/06/18 (!) 143/78    A/P: Patient presents in no distress and reports no symptoms of concern. He states he is tolerating medications with no side effects. He was seen in a co-visit (Dr. Tarri Abernethy, Mannie Stabile, attending physician Joni Reining)  [place] and Reviewed  [facility] sensor placed 05/06/18  Week #1 of monitoring  11-day BG average 144, within target range 73% of the time  Above 180 mg/dL 22% of the time, highest around nighttime  Below 70 mg/dL 5% of the time, lowest around  afternoon  Patient does not check BG at home, so unable to validate results. I will send a refill today on his test strips. Hypoglycemia occurs around 3pm, but he does also have hypoglycemia around 8am. Will decrease his insulin slightly today. Regarding hyperglycemia, we were able to identify the 1 day he experienced this and he states he may have missed a dose of medication that  day.   We will re-start his hydralazine today. Last month, patient was expected to be taking the hydralazine, but the bottle for hydralazine was missing from his medication bag and he stated he was not taking it. Pharmacy student also contacted the pharmacy and found that it was not being filled. Discussed this plan with Dr. Shan Levans who approved.  Summary of changes: Decrease insulin aspart 70/30 dose to 48 units BID, continue metformin and liraglutide Re-initiate hydralazine 25 mg TID Prescription sent for BG test strips  An after visit summary was provided and patient advised to follow up in 1 week or sooner if any changes in condition or questions regarding medications arise.   The patient verbalized understanding of information provided by repeating back concepts discussed.   30 minutes spent face-to-face with the patient during the encounter. 50% of time spent on education. 50% of time was spent on assessment, plan, and coordination of care.

## 2018-05-19 NOTE — Progress Notes (Signed)
Internal Medicine Clinic Attending  Case discussed with Dr. Tarri Abernethy at the time of the visit.  We reviewed the resident's history and exam and pertinent patient test results.  I agree with the assessment, diagnosis, and plan of care documented in the resident's note.  I personally reviewed the CGM data, the resident's interpretation, and agree with the intervention.

## 2018-05-23 ENCOUNTER — Ambulatory Visit: Payer: BLUE CROSS/BLUE SHIELD | Admitting: Internal Medicine

## 2018-05-23 ENCOUNTER — Other Ambulatory Visit: Payer: Self-pay

## 2018-05-23 ENCOUNTER — Ambulatory Visit: Payer: BLUE CROSS/BLUE SHIELD

## 2018-05-23 ENCOUNTER — Encounter: Payer: Self-pay | Admitting: Internal Medicine

## 2018-05-23 VITALS — BP 148/67 | HR 74 | Temp 98.6°F | Ht 71.0 in

## 2018-05-23 DIAGNOSIS — E1165 Type 2 diabetes mellitus with hyperglycemia: Secondary | ICD-10-CM

## 2018-05-23 DIAGNOSIS — IMO0002 Reserved for concepts with insufficient information to code with codable children: Secondary | ICD-10-CM

## 2018-05-23 DIAGNOSIS — E118 Type 2 diabetes mellitus with unspecified complications: Principal | ICD-10-CM

## 2018-05-23 NOTE — Progress Notes (Signed)
Internal Medicine Clinic Attending  Case seen with Dr. Laural Golden at the time of the visit. We reviewed the resident's history and exam and pertinent patient test results. I personally reviewed the CGM data & the resident's interpretation. I agree with the assessment, diagnosis, and plan of care documented in the resident's note.

## 2018-05-23 NOTE — Patient Instructions (Signed)
[name],  I am glad to see you are doing so well. I want you to continue taking 48 units of Novolin twice a day. If you start noticing any hypoglycemic episodes please give [place] a call.   Happy Holidays!

## 2018-05-23 NOTE — Assessment & Plan Note (Signed)
Patient presented for CGM download #2.  His NovoLog 70/30 was decreased to 48 units twice daily during his last visit on 12/13.  He is also taking Victoza 1.8 mg daily and metformin 1000 mg daily.  Ring to his CGM patient is experiencing hypoglycemia approximately 5% of the time.  Episodes are occurring mostly in the late afternoon.  Patient denies any hypoglycemic symptoms.  We will continue NovoLog 70/30 48 units twice daily and all of his other medications as prescribed.  Plan: - Continue NovoLog 70/30 48 units twice daily, Victoza 1.8 mg daily and metformin 1000 mg daily

## 2018-05-23 NOTE — Progress Notes (Signed)
   CC: Diabetes mellitus   HPI:  Mr.Urban B Stthomas is a 62 y.o. male with a PMHx listed below presenting for CGM download #2. For details of today's visit and the status of his chronic medical issues please refer to the assessment and plan.   Past Medical History:  Diagnosis Date  . Cardiomyopathy    Resolved with EF 55% 2011  . CARDIOMYOPATHY 04/22/2006  . CARDIOMYOPATHY 04/22/2006   2- D Echo (04/2010) : The EF is probably 55% with some hypokinesis at the base of the inferior wall. Wall thickness was increased in a pattern of mild LVH. Patient followed by Dr Martinique (Cardiology)     . Diabetes mellitus   . DIABETIC  RETINOPATHY 02/06/2007  . GERD (gastroesophageal reflux disease)   . Hyperlipemia   . Hypertension   . NEPHROPATHY, DIABETIC 06/08/2006  . Paroxysmal atrial tachycardia (HCC)    Review of Systems:  Review of Systems  Gastrointestinal: Negative for abdominal pain, nausea and vomiting.  Genitourinary: Negative for dysuria, frequency and urgency.  Musculoskeletal: Negative for falls.  Neurological: Negative for dizziness, loss of consciousness, weakness and headaches.     Physical Exam:  Vitals:   05/23/18 1436  BP: (!) 148/67  Pulse: 74  Temp: 98.6 F (37 C)  SpO2: 99%  Height: 5\' 11"  (1.803 m)   Physical Exam  Constitutional: He is oriented to person, place, and time and well-developed, well-nourished, and in no distress.  Cardiovascular: Normal rate, regular rhythm and normal heart sounds.  No murmur heard. Pulmonary/Chest: Effort normal and breath sounds normal. No respiratory distress. He has no wheezes.  Abdominal: Soft. Bowel sounds are normal. He exhibits no distension. There is no abdominal tenderness.  Neurological: He is alert and oriented to person, place, and time.  Skin: Skin is warm and dry.  Psychiatric: Mood, memory, affect and judgment normal.    Assessment & Plan:   See Encounters Tab for problem based charting.  Patient seen with Dr.  Evette Doffing

## 2018-08-25 ENCOUNTER — Encounter: Payer: BLUE CROSS/BLUE SHIELD | Admitting: Internal Medicine

## 2018-09-29 ENCOUNTER — Other Ambulatory Visit: Payer: Self-pay

## 2018-09-29 ENCOUNTER — Other Ambulatory Visit: Payer: Self-pay | Admitting: Internal Medicine

## 2018-09-29 ENCOUNTER — Ambulatory Visit (INDEPENDENT_AMBULATORY_CARE_PROVIDER_SITE_OTHER): Payer: Medicare Other | Admitting: Internal Medicine

## 2018-09-29 DIAGNOSIS — N183 Chronic kidney disease, stage 3 unspecified: Secondary | ICD-10-CM

## 2018-09-29 DIAGNOSIS — E1122 Type 2 diabetes mellitus with diabetic chronic kidney disease: Secondary | ICD-10-CM | POA: Diagnosis not present

## 2018-09-29 DIAGNOSIS — E118 Type 2 diabetes mellitus with unspecified complications: Principal | ICD-10-CM

## 2018-09-29 DIAGNOSIS — Z794 Long term (current) use of insulin: Principal | ICD-10-CM

## 2018-09-29 DIAGNOSIS — I129 Hypertensive chronic kidney disease with stage 1 through stage 4 chronic kidney disease, or unspecified chronic kidney disease: Secondary | ICD-10-CM

## 2018-09-29 DIAGNOSIS — Z79899 Other long term (current) drug therapy: Secondary | ICD-10-CM | POA: Diagnosis not present

## 2018-09-29 DIAGNOSIS — E1165 Type 2 diabetes mellitus with hyperglycemia: Secondary | ICD-10-CM

## 2018-09-29 DIAGNOSIS — IMO0002 Reserved for concepts with insufficient information to code with codable children: Secondary | ICD-10-CM

## 2018-09-29 DIAGNOSIS — E1129 Type 2 diabetes mellitus with other diabetic kidney complication: Secondary | ICD-10-CM

## 2018-09-29 DIAGNOSIS — Z9119 Patient's noncompliance with other medical treatment and regimen: Secondary | ICD-10-CM

## 2018-09-29 MED ORDER — SODIUM BICARBONATE 325 MG PO TABS: 325.0000 mg | ORAL_TABLET | Freq: Three times a day (TID) | ORAL | 0 refills | Status: DC

## 2018-09-29 MED ORDER — CHOLECALCIFEROL 25 MCG (1000 UT) PO CAPS: 2000.0000 [IU] | ORAL_CAPSULE | Freq: Every day | ORAL | 1 refills | Status: DC

## 2018-09-29 MED ORDER — CANAGLIFLOZIN 300 MG PO TABS: ORAL_TABLET | ORAL | Status: DC

## 2018-09-29 MED ORDER — INSULIN ASPART PROT & ASPART (70-30 MIX) 100 UNIT/ML PEN: PEN_INJECTOR | SUBCUTANEOUS | 11 refills | Status: DC

## 2018-09-29 MED ORDER — CANAGLIFLOZIN 300 MG PO TABS: 300.0000 mg | ORAL_TABLET | Freq: Every day | ORAL | Status: DC

## 2018-09-29 NOTE — Assessment & Plan Note (Signed)
   CKD 3: Has periods of noncompliance which seem to worsen his renal function, he is back on track now.  Last two labs show NAGMA 2/2 renal disease.  Out of vitamin D, ran out for one week.  Start on bicarbonate calculated need at about 1150mg  per day, prescribed TID dosing of 325mg  tabs.    Needs repeat labs in about one month.    Cautiously will start back invokana as well to help with proteinuria  Re-prescribe vitamin D

## 2018-09-29 NOTE — Assessment & Plan Note (Signed)
   T2DM: has been going okay, cbg readings mostly okay 170 this am.  In the evenings usually runs about 140.  Rarely goes above 200.  Never started invokana due to poor compliance and not wanting to start too much at once.    Starting back invokana cautiously

## 2018-09-29 NOTE — Progress Notes (Signed)
   [facility] Telephone Encounter  Reason for call:   This telephone encounter was created for [name] on 09/29/2018 for the following purpose/cc T2DM, CKD 3.   Pertinent Data:   Hx of T2DM, CKD 3, HTN ROS: Pulmonary: pt denies increased work of breathing, shortness of breath,  Cardiac: pt denies palpitations, chest pain,   Abdominal: pt denies abdominal pain, nausea, vomiting, or diarrhea   Assessment / Plan / Recommendations:   T2DM: has been going okay, cbg readings mostly okay 170 this am.  In the evenings usually runs about 140.  Rarely goes above 200.  Never started invokana due to poor compliance and not wanting to start too much at once.    Starting back invokana cautiously  CKD 3: Has periods of noncompliance which seem to worsen his renal function, he is back on track now.  Last two labs show NAGMA 2/2 renal disease.  Out of vitamin D, ran out for one week.  Start on bicarbonate calculated need at about 1150mg  per day, prescribed TID dosing of 325mg  tabs.    Needs repeat labs in about one month.    Cautiously will start back invokana as well to help with proteinuria  Re-prescribe vitamin D  As always, pt is advised that if symptoms worsen or new symptoms arise, they should go to an urgent care facility or to to ER for further evaluation.   Consent and Medical Decision Making:   Patient discussed with Dr. Dareen Piano  This is a telephone encounter between Festus Holts and Vickki Muff on 09/29/2018 for T2DM, CKD 3. The visit was conducted with the patient located at home and Vickki Muff at [facility]. The patient's identity was confirmed using their DOB and current address. The patient has consented to being evaluated through a telephone encounter and understands the associated risks (an examination cannot be done and the patient may need to come in for an appointment) / benefits (allows the patient to remain at home, decreasing  exposure to coronavirus). I personally spent 17 minutes on medical discussion.

## 2018-09-30 NOTE — Progress Notes (Signed)
Internal Medicine Clinic Attending  Case discussed with Dr. Winfrey  at the time of the visit.  We reviewed the resident's history and exam and pertinent patient test results.  I agree with the assessment, diagnosis, and plan of care documented in the resident's note.  

## 2018-10-09 ENCOUNTER — Telehealth: Payer: Self-pay

## 2018-10-13 NOTE — Progress Notes (Signed)
[name] is a 63 y.o. male who was contacted on behalf of [facility] Geriatrics Task Force. Tried calling patient x 4, unable to reach. Notably, he may not be taking Invokana.

## 2018-10-14 ENCOUNTER — Telehealth: Payer: Self-pay

## 2018-10-14 NOTE — Telephone Encounter (Signed)
Contacting patient on behalf of geriatrics task force. Unable to reach.

## 2018-10-26 ENCOUNTER — Other Ambulatory Visit: Payer: Self-pay | Admitting: Internal Medicine

## 2018-10-26 DIAGNOSIS — I10 Essential (primary) hypertension: Secondary | ICD-10-CM

## 2018-10-29 NOTE — Telephone Encounter (Signed)
refilled 

## 2018-11-18 ENCOUNTER — Telehealth: Payer: Self-pay | Admitting: Pharmacist

## 2018-11-18 DIAGNOSIS — IMO0002 Reserved for concepts with insufficient information to code with codable children: Secondary | ICD-10-CM

## 2018-11-18 DIAGNOSIS — E1165 Type 2 diabetes mellitus with hyperglycemia: Secondary | ICD-10-CM

## 2018-11-18 NOTE — Progress Notes (Signed)
Patient called requesting help with medication samples, scheduled to pick up in [facility] 11/20/2018. Referral placed for [facility] medication access support for Victoza, Jardiance, and Novolog 70/30. Patient verbalized understanding.

## 2018-11-19 ENCOUNTER — Other Ambulatory Visit: Payer: Self-pay | Admitting: *Deleted

## 2018-11-19 NOTE — Patient Outreach (Signed)
  [facility]) Care Management  11/19/2018  [name] 01-Oct-1955 [id number]   Telephone Screen  Referral Date:11/18/18 Referral Source: MD referral - Forde Dandy, PharmD Referral Reason: med affordability help, Victoza, Jardiance, Novolog 70/30    Diagnoses of Diabetes   Insurance: [employer] cross and blue shield    Outreach attempt # 1 No answer. [facility] RN CM left HIPAA compliant voicemail message along with CM's contact info.   Plan: [facility] RN CM sent an unsuccessful outreach letter and scheduled this patient for another call attempt within 4 business days   Kimberly L. Lavina Hamman, RN, BSN, [facility] Coordinator Office number [phone] Mobile number [phone]  Main [facility] number [phone] Fax number [phone]

## 2018-11-20 ENCOUNTER — Other Ambulatory Visit: Payer: Self-pay

## 2018-11-20 ENCOUNTER — Ambulatory Visit (INDEPENDENT_AMBULATORY_CARE_PROVIDER_SITE_OTHER): Payer: Medicare Other | Admitting: Pharmacist

## 2018-11-20 DIAGNOSIS — E1165 Type 2 diabetes mellitus with hyperglycemia: Secondary | ICD-10-CM | POA: Diagnosis not present

## 2018-11-20 DIAGNOSIS — E118 Type 2 diabetes mellitus with unspecified complications: Secondary | ICD-10-CM

## 2018-11-20 DIAGNOSIS — IMO0002 Reserved for concepts with insufficient information to code with codable children: Secondary | ICD-10-CM

## 2018-11-20 LAB — POCT GLYCOSYLATED HEMOGLOBIN (HGB A1C): Hemoglobin A1C: 9.2 % — AB (ref 4.0–5.6)

## 2018-11-20 LAB — GLUCOSE, CAPILLARY: Glucose-Capillary: 128 mg/dL — ABNORMAL HIGH (ref 70–99)

## 2018-11-20 MED ORDER — LIRAGLUTIDE 18 MG/3ML ~~LOC~~ SOPN: 1.8000 mg | PEN_INJECTOR | Freq: Every day | SUBCUTANEOUS | 11 refills | Status: DC

## 2018-11-20 NOTE — Progress Notes (Signed)
Medication Samples have been provided to the patient.  Drug name: Novolog       Strength: 70/30        Qty: 4  LOT: [id number]  Exp.Date: 07/2019  Dosing instructions: 78 units BID  The patient has been instructed regarding the correct time, dose, and frequency of taking this medication, including desired effects and most common side effects.   [name] 1:09 PM 11/20/2018

## 2018-11-21 ENCOUNTER — Other Ambulatory Visit: Payer: Self-pay | Admitting: *Deleted

## 2018-11-21 LAB — BMP8+ANION GAP
Anion Gap: 14 mmol/L (ref 10.0–18.0)
BUN/Creatinine Ratio: 16 (ref 10–24)
BUN: 39 mg/dL — ABNORMAL HIGH (ref 8–27)
CO2: 16 mmol/L — ABNORMAL LOW (ref 20–29)
Calcium: 9 mg/dL (ref 8.6–10.2)
Chloride: 111 mmol/L — ABNORMAL HIGH (ref 96–106)
Creatinine, Ser: 2.38 mg/dL — ABNORMAL HIGH (ref 0.76–1.27)
GFR calc Af Amer: 32 mL/min/{1.73_m2} — ABNORMAL LOW (ref >59)
GFR calc non Af Amer: 28 mL/min/{1.73_m2} — ABNORMAL LOW (ref >59)
Glucose: 142 mg/dL — ABNORMAL HIGH (ref 65–99)
Potassium: 4.9 mmol/L (ref 3.5–5.2)
Sodium: 141 mmol/L (ref 134–144)

## 2018-11-21 LAB — MICROALBUMIN / CREATININE URINE RATIO
Creatinine, Urine: 188.9 mg/dL
Microalb/Creat Ratio: 2723 mg/g creat — ABNORMAL HIGH (ref 0–29)
Microalbumin, Urine: 5144.2 ug/mL

## 2018-11-21 NOTE — Addendum Note (Signed)
Addended by: Forde Dandy on: 11/21/2018 01:26 PM   Modules accepted: Orders

## 2018-11-21 NOTE — Patient Outreach (Signed)
[name]) Care Management  11/21/2018  [name] 11-24-1955 [id number]   Telephone Screen  Referral Date: 11/18/18 Referral Source: MD referral - Forde Dandy, PharmD Referral Reason: med affordability help, Victoza, Jardiance, Novolog 70/30    Diagnoses of Diabetes   Insurance: blue cross and blue shield   Outreach attempt # 2 No answer. [facility] RN CM left HIPAA compliant voicemail message along with CM's contact info.   Sent an in basket message to [place] to inquire if pt medication concerns had been resolved Pending response  Plan: [facility] RN CM sent an unsuccessful outreach letter on 11/19/18, CM left voice messages on 11/19/18 and 11/21/18  [facility] RN CM scheduled this patient for a final call attempt within 4 business days  Routed to refrrring pharmacy and MD   Daniel. Lavina Hamman, RN, BSN, [place] Coordinator Office number [phone] Mobile number [phone]  Main [facility] number [phone] Fax number [phone]

## 2018-11-24 ENCOUNTER — Other Ambulatory Visit: Payer: Self-pay | Admitting: *Deleted

## 2018-11-24 NOTE — Patient Outreach (Signed)
[facility]) Care Management  11/24/2018  [name] [birth year] [id number]   Telephone Screen  Referral Date: 11/18/18 Referral Source:MD referral -Forde Dandy, PharmD Referral Reason:med affordability help,Victoza, Jardiance, Novolog 70/30   Diagnoses of Diabetes   Insurance:blue cross and blue shield   Outreach attempt # 3 No answer. [facility] RN CM left HIPAA compliant voicemail message along with CM's contact info.   [facility] RN CM requested pt return a call to update RN CM if assistance is still needed   Plan: [facility] RN CM scheduled this patient for case closure per [facility] call attempt work flow within 6 business days [facility] RN CM sent an unsuccessful outreach letter on 11/19/18, CM left voice messages on 11/19/18,  11/21/18 and 11/24/18  [facility] RN CM Sent an in basket message to Flossie Dibble, Pharmacy on 11/21/18 to inquire if pt medication concerns had been resolved Pending response Note has been routed to referrring pharmacy and primary MD on 11/21/18    Joelene Millin L. Lavina Hamman, RN, BSN, [place] Coordinator Office number [phone] Mobile number [phone]  Main [facility] number [phone] Fax number [phone]

## 2018-11-25 ENCOUNTER — Other Ambulatory Visit: Payer: Self-pay | Admitting: Dietician

## 2018-11-25 ENCOUNTER — Telehealth: Payer: Self-pay | Admitting: Dietician

## 2018-11-25 DIAGNOSIS — IMO0002 Reserved for concepts with insufficient information to code with codable children: Secondary | ICD-10-CM

## 2018-11-25 DIAGNOSIS — E1165 Type 2 diabetes mellitus with hyperglycemia: Secondary | ICD-10-CM

## 2018-11-25 NOTE — Progress Notes (Signed)
Referral request per Dr. Mannie Stabile. Thank you!

## 2018-12-01 NOTE — Telephone Encounter (Signed)
Contacting patient to schedule appointment per referral.  [name], RD 12/01/2018 11:32 AM.

## 2018-12-03 ENCOUNTER — Encounter: Payer: Self-pay | Admitting: Dietician

## 2018-12-03 NOTE — Telephone Encounter (Signed)
Left voicemail for return call, this is the third attempt. Will mail letter asking patient to call.  Debera Lat, RD 12/03/2018 1:37 PM.

## 2018-12-10 ENCOUNTER — Other Ambulatory Visit: Payer: Self-pay | Admitting: *Deleted

## 2018-12-10 NOTE — Patient Outreach (Signed)
[facility]) Care Management  12/10/2018  [name] 1956/01/25 [id number]   Case closure   Call attempts made on 11/19/18, 11/21/18, and  11/24/18 Unsuccessful outreach letter sent on 11/19/18 without a response   Plan [facility] RN CM will close case after no response from patient within 10+ business days. Unable to reach Case closure letters sent to patient and MD/[name] Mouse, PharmD Routed note to MD  Joelene Millin L. Lavina Hamman, RN, BSN, [place] Coordinator Office number [phone] Mobile number [phone]  Main [facility] number [phone] Fax number [phone]

## 2018-12-10 NOTE — Addendum Note (Signed)
Addended by: Hulan Fray on: 12/10/2018 07:44 PM   Modules accepted: Orders

## 2018-12-25 ENCOUNTER — Other Ambulatory Visit: Payer: Self-pay | Admitting: Internal Medicine

## 2018-12-25 DIAGNOSIS — I10 Essential (primary) hypertension: Secondary | ICD-10-CM

## 2018-12-25 NOTE — Telephone Encounter (Signed)
refilled 

## 2018-12-31 NOTE — Addendum Note (Signed)
Addended by: Hulan Fray on: 12/31/2018 05:10 PM   Modules accepted: Orders

## 2018-12-31 NOTE — Addendum Note (Signed)
Addended by: Hulan Fray on: 12/31/2018 05:12 PM   Modules accepted: Orders

## 2019-02-13 DIAGNOSIS — E11319 Type 2 diabetes mellitus with unspecified diabetic retinopathy without macular edema: Secondary | ICD-10-CM | POA: Diagnosis not present

## 2019-02-13 LAB — HM DIABETES EYE EXAM

## 2019-02-16 LAB — HM DIABETES EYE EXAM

## 2019-03-02 ENCOUNTER — Encounter: Payer: Self-pay | Admitting: Internal Medicine

## 2019-03-02 ENCOUNTER — Encounter (INDEPENDENT_AMBULATORY_CARE_PROVIDER_SITE_OTHER): Payer: Self-pay

## 2019-03-02 ENCOUNTER — Other Ambulatory Visit: Payer: Self-pay

## 2019-03-02 ENCOUNTER — Ambulatory Visit (INDEPENDENT_AMBULATORY_CARE_PROVIDER_SITE_OTHER): Payer: Medicare Other | Admitting: Internal Medicine

## 2019-03-02 VITALS — BP 155/74 | HR 62 | Temp 98.6°F | Wt 290.3 lb

## 2019-03-02 DIAGNOSIS — Z794 Long term (current) use of insulin: Secondary | ICD-10-CM

## 2019-03-02 DIAGNOSIS — E1122 Type 2 diabetes mellitus with diabetic chronic kidney disease: Secondary | ICD-10-CM

## 2019-03-02 DIAGNOSIS — IMO0002 Reserved for concepts with insufficient information to code with codable children: Secondary | ICD-10-CM

## 2019-03-02 DIAGNOSIS — E118 Type 2 diabetes mellitus with unspecified complications: Secondary | ICD-10-CM | POA: Diagnosis not present

## 2019-03-02 DIAGNOSIS — E1165 Type 2 diabetes mellitus with hyperglycemia: Secondary | ICD-10-CM

## 2019-03-02 DIAGNOSIS — E785 Hyperlipidemia, unspecified: Secondary | ICD-10-CM

## 2019-03-02 DIAGNOSIS — Z1211 Encounter for screening for malignant neoplasm of colon: Secondary | ICD-10-CM

## 2019-03-02 DIAGNOSIS — I129 Hypertensive chronic kidney disease with stage 1 through stage 4 chronic kidney disease, or unspecified chronic kidney disease: Secondary | ICD-10-CM | POA: Diagnosis not present

## 2019-03-02 DIAGNOSIS — N183 Chronic kidney disease, stage 3 unspecified: Secondary | ICD-10-CM

## 2019-03-02 DIAGNOSIS — Z23 Encounter for immunization: Secondary | ICD-10-CM

## 2019-03-02 DIAGNOSIS — I10 Essential (primary) hypertension: Secondary | ICD-10-CM

## 2019-03-02 DIAGNOSIS — Z79899 Other long term (current) drug therapy: Secondary | ICD-10-CM

## 2019-03-02 LAB — GLUCOSE, CAPILLARY: Glucose-Capillary: 182 mg/dL — ABNORMAL HIGH (ref 70–99)

## 2019-03-02 LAB — POCT GLYCOSYLATED HEMOGLOBIN (HGB A1C): Hemoglobin A1C: 9.9 % — AB (ref 4.0–5.6)

## 2019-03-02 MED ORDER — SODIUM BICARBONATE 325 MG PO TABS: 325.0000 mg | ORAL_TABLET | Freq: Three times a day (TID) | ORAL | 0 refills | Status: DC

## 2019-03-02 MED ORDER — DILTIAZEM HCL ER 60 MG PO CP12: 60.0000 mg | ORAL_CAPSULE | Freq: Two times a day (BID) | ORAL | 0 refills | Status: DC

## 2019-03-02 MED ORDER — ATORVASTATIN CALCIUM 80 MG PO TABS: 80.0000 mg | ORAL_TABLET | Freq: Every day | ORAL | 3 refills | Status: AC

## 2019-03-02 MED ORDER — LISINOPRIL 20 MG PO TABS: 20.0000 mg | ORAL_TABLET | Freq: Every day | ORAL | 0 refills | Status: DC

## 2019-03-02 MED ORDER — FUROSEMIDE 20 MG PO TABS: 20.0000 mg | ORAL_TABLET | Freq: Every day | ORAL | 0 refills | Status: DC | PRN

## 2019-03-02 MED ORDER — INSULIN LISPRO PROT & LISPRO (75-25 MIX) 100 UNIT/ML KWIKPEN: 58.0000 [IU] | PEN_INJECTOR | Freq: Two times a day (BID) | SUBCUTANEOUS | 11 refills | Status: DC

## 2019-03-02 NOTE — Patient Instructions (Signed)
[name] we have restarted your lisinopril and added back a lower dose of your dilitiazem for blood pressure.  I have changed your insulin to 58 units twice daily.  I have referred you for a colonoscopy.  I have added a pill to help with your lower extremity swelling called Lasix you can take 20 mg as needed.  Please make sure to return in 1 month so we can check your progress.

## 2019-03-02 NOTE — Assessment & Plan Note (Signed)
Pt requires refills on medications with associated diagnosis above.  Reviewed disease process and find this medication to be necessary, will not change dose or alter current therapy. 

## 2019-03-02 NOTE — Assessment & Plan Note (Addendum)
BP Readings from Last 3 Encounters:  03/02/19 (!) 155/74  05/23/18 (!) 148/67  05/16/18 (!) 160/82    BP still elevated since self discontinuation vs meds dropping off his med list, have been adding back medications to get him back into control.  He has been out of his lisinopril for a few weeks now.  He is taking hydralazine and carvedilol.  Discussed that he shouldn't let his medications run out and should take a more active role in his healthcare.  His son is here today and will begin getting more involved.    -start back lower dose diltiazem would preferentially uptitrate this and downtitrate carvedilol depending on how his heart rate does given his proteinuria.   -refill lisinopril at 20mg  for proteinuria

## 2019-03-02 NOTE — Progress Notes (Signed)
CC: HTN, T2DM, CKD 3  HPI:  [name] is a 63 y.o. male with PMH below.  Today we will address HTN, T2DM, CKD 3  Please see A&P for status of the patient's chronic medical conditions  Past Medical History:  Diagnosis Date  . Cardiomyopathy    Resolved with EF 55% 2011  . CARDIOMYOPATHY 04/22/2006  . CARDIOMYOPATHY 04/22/2006   2- D Echo (04/2010) : The EF is probably 55% with some hypokinesis at the base of the inferior wall. Wall thickness was increased in a pattern of mild LVH. Patient followed by Dr Martinique (Cardiology)     . Diabetes mellitus   . DIABETIC  RETINOPATHY 02/06/2007  . GERD (gastroesophageal reflux disease)   . Hyperlipemia   . Hypertension   . NEPHROPATHY, DIABETIC 06/08/2006  . Paroxysmal atrial tachycardia (HCC)    Review of Systems:  ROS: Pulmonary: pt denies increased work of breathing, shortness of breath,  Cardiac: pt denies palpitations, chest pain,  Abdominal: pt denies abdominal pain, nausea, vomiting, or diarrhea   Physical Exam:  Vitals:   03/02/19 1013 03/02/19 1050  BP: (!) 185/81 (!) 155/74  Pulse: 68 62  Temp: 98.6 F (37 C)   TempSrc: Oral   SpO2: 100%   Weight: 290 lb 4.8 oz (131.7 kg)    Cardiac: JVD flat, normal rate and rhythm, clear s1 and s2, no murmurs, rubs or gallops, 1+ LE edema bilaterally Pulmonary: CTAB, not in distress Abdominal: non distended abdomen, soft and nontender Psych: Alert, conversant, in good spirits   Social History   Socioeconomic History  . Marital status: Single    Spouse name: Not on file  . Number of children: Not on file  . Years of education: Not on file  . Highest education level: Not on file  Occupational History  . Not on file  Social Needs  . Financial resource strain: Not on file  . Food insecurity    Worry: Not on file    Inability: Not on file  . Transportation needs    Medical: Not on file    Non-medical: Not on file  Tobacco Use  . Smoking status: Never Smoker  .  Smokeless tobacco: Never Used  Substance and Sexual Activity  . Alcohol use: No    Alcohol/week: 0.0 standard drinks  . Drug use: No  . Sexual activity: Not on file  Lifestyle  . Physical activity    Days per week: Not on file    Minutes per session: Not on file  . Stress: Not on file  Relationships  . Social Herbalist on phone: Not on file    Gets together: Not on file    Attends religious service: Not on file    Active member of club or organization: Not on file    Attends meetings of clubs or organizations: Not on file    Relationship status: Not on file  . Intimate partner violence    Fear of current or ex partner: Not on file    Emotionally abused: Not on file    Physically abused: Not on file    Forced sexual activity: Not on file  Other Topics Concern  . Not on file  Social History Narrative  . Not on file    Family History  Problem Relation Age of Onset  . Diabetes Mother   . Diabetes Brother     Assessment & Plan:   See Encounters Tab for problem based  charting.  Patient discussed with Dr. Lynnae January

## 2019-03-02 NOTE — Assessment & Plan Note (Addendum)
Renal function overall stable on last bmp.  Never started the bicarbonate therapy ordered last visit.  Has been off lisinopril and diltiazem medicines with proven benefit in patients with proteinuria.  Renal function a little tenuous to consider SGLT-2i therapy.  He also has some lower extremity edema likely related to his renal insufficiency.    -restart lisinopril at 20mg  -restart bicarbonate -restart diltiazem  -PRN lasix for LE edema

## 2019-03-02 NOTE — Assessment & Plan Note (Addendum)
He brought his meter in today, The majority of his cbg values fasting are elevated above 200, around 220.  His before dinner cbg however is usually lower at around 160 although only three readings present.  He reports good compliance with his 70/30 insulin says he never misses a dose.  Currently he is on 60 units in the am and 50 units in the pm    - will switch to 58 units BID  -continue renally dosed metformin -continue victoza 1.8mg 

## 2019-03-03 NOTE — Addendum Note (Signed)
Addended by: Marcelino Duster on: 03/03/2019 12:29 PM   Modules accepted: Orders

## 2019-03-03 NOTE — Progress Notes (Signed)
Internal Medicine Clinic Attending  Case discussed with Dr. Winfrey  at the time of the visit.  We reviewed the resident's history and exam and pertinent patient test results.  I agree with the assessment, diagnosis, and plan of care documented in the resident's note.  

## 2019-03-04 ENCOUNTER — Encounter: Payer: Self-pay | Admitting: Internal Medicine

## 2019-03-06 ENCOUNTER — Ambulatory Visit: Payer: Medicare Other | Admitting: Podiatry

## 2019-03-06 ENCOUNTER — Other Ambulatory Visit: Payer: Self-pay

## 2019-03-06 ENCOUNTER — Encounter: Payer: Self-pay | Admitting: Podiatry

## 2019-03-06 DIAGNOSIS — E1159 Type 2 diabetes mellitus with other circulatory complications: Secondary | ICD-10-CM

## 2019-03-06 DIAGNOSIS — B351 Tinea unguium: Secondary | ICD-10-CM

## 2019-03-06 DIAGNOSIS — M79675 Pain in left toe(s): Secondary | ICD-10-CM | POA: Diagnosis not present

## 2019-03-06 DIAGNOSIS — M79674 Pain in right toe(s): Secondary | ICD-10-CM

## 2019-03-06 NOTE — Progress Notes (Signed)
This patient presents to the office with chief complaint of long thick nails and diabetic feet.  This patient  says there  is  no pain and discomfort in his  feet.  This patient says there are long thick painful nails.  These nails are painful walking and wearing shoes.  Patient has no history of infection or drainage from both feet.  Patient is unable to  self treat his own nails .This patient presents to the office with his son. This patient presents  to the office today for treatment of the  long nails and a foot evaluation due to history of  diabetes.  General Appearance  Alert, conversant and in no acute stress.  Vascular  Dorsalis pedis and posterior tibial  pulses are not  Palpable due to swelling   bilaterally.  Capillary return is within normal limits  bilaterally. Temperature is within normal limits  bilaterally.  Neurologic  Senn-Weinstein monofilament wire test within normal limits  bilaterally. Muscle power within normal limits bilaterally.  Nails Thick disfigured discolored nails with subungual debris  from hallux to fifth toes bilaterally. No evidence of bacterial infection or drainage bilaterally.  Orthopedic  No limitations of motion of motion feet .  No crepitus or effusions noted.  No bony pathology or digital deformities noted.  Skin  normotropic skin with no porokeratosis noted bilaterally.  No signs of infections or ulcers noted.     Onychomycosis  Diabetes with vascular disease.  IE  Debride nails x 10.  A diabetic foot exam was performed and there is no evidence of any  neurologic pathology. Possible vascular disease .  RTC 3 months.   Gardiner Barefoot DPM

## 2019-03-11 ENCOUNTER — Other Ambulatory Visit: Payer: Self-pay | Admitting: *Deleted

## 2019-03-11 DIAGNOSIS — I10 Essential (primary) hypertension: Secondary | ICD-10-CM

## 2019-03-11 NOTE — Telephone Encounter (Signed)
May need to adjust dose vs discontinue depending on renal function/electrolytes at next visit no need for 90 day supplies at this juncture

## 2019-03-23 ENCOUNTER — Other Ambulatory Visit: Payer: Self-pay

## 2019-03-23 ENCOUNTER — Encounter: Payer: Self-pay | Admitting: Internal Medicine

## 2019-03-23 ENCOUNTER — Ambulatory Visit (INDEPENDENT_AMBULATORY_CARE_PROVIDER_SITE_OTHER): Payer: Medicare Other | Admitting: Internal Medicine

## 2019-03-23 VITALS — BP 144/60 | HR 56 | Temp 98.5°F | Ht 71.0 in | Wt 296.0 lb

## 2019-03-23 DIAGNOSIS — E1165 Type 2 diabetes mellitus with hyperglycemia: Secondary | ICD-10-CM

## 2019-03-23 DIAGNOSIS — I129 Hypertensive chronic kidney disease with stage 1 through stage 4 chronic kidney disease, or unspecified chronic kidney disease: Secondary | ICD-10-CM

## 2019-03-23 DIAGNOSIS — I10 Essential (primary) hypertension: Secondary | ICD-10-CM | POA: Diagnosis not present

## 2019-03-23 DIAGNOSIS — E1122 Type 2 diabetes mellitus with diabetic chronic kidney disease: Secondary | ICD-10-CM | POA: Diagnosis not present

## 2019-03-23 DIAGNOSIS — E118 Type 2 diabetes mellitus with unspecified complications: Secondary | ICD-10-CM

## 2019-03-23 DIAGNOSIS — Z794 Long term (current) use of insulin: Secondary | ICD-10-CM

## 2019-03-23 DIAGNOSIS — N1832 Chronic kidney disease, stage 3b: Secondary | ICD-10-CM

## 2019-03-23 DIAGNOSIS — IMO0002 Reserved for concepts with insufficient information to code with codable children: Secondary | ICD-10-CM

## 2019-03-23 DIAGNOSIS — N183 Chronic kidney disease, stage 3 unspecified: Secondary | ICD-10-CM | POA: Diagnosis not present

## 2019-03-23 DIAGNOSIS — Z79899 Other long term (current) drug therapy: Secondary | ICD-10-CM

## 2019-03-23 LAB — GLUCOSE, CAPILLARY: Glucose-Capillary: 337 mg/dL — ABNORMAL HIGH (ref 70–99)

## 2019-03-23 MED ORDER — INSULIN LISPRO PROT & LISPRO (75-25 MIX) 100 UNIT/ML KWIKPEN: 63.0000 [IU] | PEN_INJECTOR | Freq: Two times a day (BID) | SUBCUTANEOUS | 11 refills | Status: DC

## 2019-03-23 MED ORDER — CHOLECALCIFEROL 25 MCG (1000 UT) PO CAPS: 2000.0000 [IU] | ORAL_CAPSULE | Freq: Every day | ORAL | 1 refills | Status: DC

## 2019-03-23 MED ORDER — LISINOPRIL 20 MG PO TABS: 20.0000 mg | ORAL_TABLET | Freq: Every day | ORAL | 0 refills | Status: DC

## 2019-03-23 MED ORDER — SODIUM BICARBONATE 325 MG PO TABS: 325.0000 mg | ORAL_TABLET | Freq: Three times a day (TID) | ORAL | 0 refills | Status: DC

## 2019-03-23 NOTE — Progress Notes (Signed)
Knee high ted hose place in the mail.

## 2019-03-23 NOTE — Assessment & Plan Note (Addendum)
last visit added lisinopril 20mg  and diltiazem 60mg  BID.  BP has improved.  My hope is renal function and potassium will be stable.  Also on carvedilol 25mg  BID, hydral 25 TID, Lasix 20mg  PRN.  Lower extremities more edematous compared with last visit educated pt on lasix use and compression stocking use.    -check bmp -continue regimen above instructed to use lasix more frequently only using once per week currently

## 2019-03-23 NOTE — Assessment & Plan Note (Signed)
Last visit cbg values uncontrolled, continued renally dosed metformin, victoza 1.8mg , and switched 70/30 from 60 units in the am and 50 units in the pm  to 58 units BID.  Diabetes has been doing okay but has had occasional high cbg values over 300 happened twice since our last visit and happens usually in the evening.  Overall cbgs have improved.  Went over some diet counseling and asked if he would like to see Butch Penny again however he would not so will continue to encourage him to do so at future visits still think he could benefit.    -increase 75/25 to 63 u BID otherwise no changes

## 2019-03-23 NOTE — Assessment & Plan Note (Signed)
Renal function overall stable restarted bicarbonate therapy last visit but still has not picked this up also no vitamin D in his med bag.  restarted lisinopril 20mg  and diltiazem 60mg  Bid last visit.  Renal function too tenuous to consider SGLT-2i therapy.  Also started some PRN lasix for LE edema unfortunately not taking this much so far.  Educated him on how to do so.  BP improved today expect continued improvement with lasix.    -check bmp today -restart bicarbonate -he will start some lasix for excess fluid as well as some compression stockings -restart vitamin D supplementation

## 2019-03-23 NOTE — Patient Instructions (Signed)
[name] I have refilled your bicarbonate and vitamin D prescriptions and your lisinopril.  I have increased your insuling to 63 units twice daily.  Please keep taking all your other medications as prescribed and we will see how you're doing in around two months.

## 2019-03-23 NOTE — Progress Notes (Signed)
CC: HTN, CKD 3, T2DM follow up  HPI:  [name] is a 63 y.o. male with PMH below.  Today we will address HTN, CKD 3, T2DM follow up  Please see A&P for status of the patient's chronic medical conditions  Past Medical History:  Diagnosis Date  . Cardiomyopathy    Resolved with EF 55% 2011  . CARDIOMYOPATHY 04/22/2006  . CARDIOMYOPATHY 04/22/2006   2- D Echo (04/2010) : The EF is probably 55% with some hypokinesis at the base of the inferior wall. Wall thickness was increased in a pattern of mild LVH. Patient followed by Dr Martinique (Cardiology)     . Diabetes mellitus   . DIABETIC  RETINOPATHY 02/06/2007  . GERD (gastroesophageal reflux disease)   . Hyperlipemia   . Hypertension   . Iron deficiency 04/09/2018  . NEPHROPATHY, DIABETIC 06/08/2006  . Paroxysmal atrial tachycardia (HCC)    Review of Systems:  ROS: Pulmonary: pt denies increased work of breathing, shortness of breath,  Cardiac: pt denies palpitations, chest pain,  Abdominal: pt denies abdominal pain, nausea, vomiting, or diarrhea   Physical Exam:  Vitals:   03/23/19 1314  BP: (!) 146/54  Pulse: 62  Temp: 98.5 F (36.9 C)  TempSrc: Oral  SpO2: 98%  Weight: 296 lb (134.3 kg)  Height: 5\' 11"  (1.803 m)   Cardiac: normal rate and rhythm, clear s1 and s2, no murmurs, rubs or gallops, 1-2+ LE edema bilaterally Pulmonary: CTAB, not in distress Abdominal: non distended abdomen, soft and nontender Psych: Alert, conversant, in good spirits   Social History   Socioeconomic History  . Marital status: Single    Spouse name: Not on file  . Number of children: Not on file  . Years of education: Not on file  . Highest education level: Not on file  Occupational History  . Not on file  Social Needs  . Financial resource strain: Not on file  . Food insecurity    Worry: Not on file    Inability: Not on file  . Transportation needs    Medical: Not on file    Non-medical: Not on file  Tobacco Use  .  Smoking status: Never Smoker  . Smokeless tobacco: Never Used  Substance and Sexual Activity  . Alcohol use: No    Alcohol/week: 0.0 standard drinks  . Drug use: No  . Sexual activity: Not on file  Lifestyle  . Physical activity    Days per week: Not on file    Minutes per session: Not on file  . Stress: Not on file  Relationships  . Social Herbalist on phone: Not on file    Gets together: Not on file    Attends religious service: Not on file    Active member of club or organization: Not on file    Attends meetings of clubs or organizations: Not on file    Relationship status: Not on file  . Intimate partner violence    Fear of current or ex partner: Not on file    Emotionally abused: Not on file    Physically abused: Not on file    Forced sexual activity: Not on file  Other Topics Concern  . Not on file  Social History Narrative  . Not on file    Family History  Problem Relation Age of Onset  . Diabetes Mother   . Diabetes Brother     Assessment & Plan:   See Encounters Tab for  problem based charting.  Patient discussed with Dr. Rebeca Alert

## 2019-03-24 LAB — RENAL FUNCTION PANEL
Albumin: 3.7 g/dL — ABNORMAL LOW (ref 3.8–4.8)
BUN/Creatinine Ratio: 16 (ref 10–24)
BUN: 56 mg/dL — ABNORMAL HIGH (ref 8–27)
CO2: 16 mmol/L — ABNORMAL LOW (ref 20–29)
Calcium: 8.8 mg/dL (ref 8.6–10.2)
Chloride: 108 mmol/L — ABNORMAL HIGH (ref 96–106)
Creatinine, Ser: 3.46 mg/dL — ABNORMAL HIGH (ref 0.76–1.27)
GFR calc Af Amer: 20 mL/min/{1.73_m2} — ABNORMAL LOW (ref >59)
GFR calc non Af Amer: 18 mL/min/{1.73_m2} — ABNORMAL LOW (ref >59)
Glucose: 340 mg/dL — ABNORMAL HIGH (ref 65–99)
Phosphorus: 3.5 mg/dL (ref 2.8–4.1)
Potassium: 5.1 mmol/L (ref 3.5–5.2)
Sodium: 137 mmol/L (ref 134–144)

## 2019-03-25 ENCOUNTER — Telehealth: Payer: Self-pay | Admitting: Internal Medicine

## 2019-03-25 DIAGNOSIS — N1832 Chronic kidney disease, stage 3b: Secondary | ICD-10-CM

## 2019-03-25 NOTE — Telephone Encounter (Signed)
Left message for patient going over worsening renal function and plan going forward.  He did not make follow up appointment when he left clinic asked him to do so so we can recheck renal function.  Asked him to d/c lisinopril and let him know at this point we could use some extra help and I will be referring him to nephrology.

## 2019-03-25 NOTE — Progress Notes (Signed)
Internal Medicine Clinic Attending  Case discussed with Dr. Winfrey at the time of the visit.  We reviewed the resident's history and exam and pertinent patient test results.  I agree with the assessment, diagnosis, and plan of care documented in the resident's note.  Alexander Raines, M.D., Ph.D.  

## 2019-03-31 ENCOUNTER — Encounter: Payer: Self-pay | Admitting: *Deleted

## 2019-04-01 ENCOUNTER — Encounter: Payer: Self-pay | Admitting: Internal Medicine

## 2019-04-02 NOTE — Addendum Note (Signed)
Addended by: Hulan Fray on: 04/02/2019 02:01 PM   Modules accepted: Orders

## 2019-04-10 ENCOUNTER — Other Ambulatory Visit: Payer: Medicare Other

## 2019-04-10 ENCOUNTER — Other Ambulatory Visit: Payer: Self-pay

## 2019-04-10 ENCOUNTER — Other Ambulatory Visit: Payer: Self-pay | Admitting: Internal Medicine

## 2019-04-10 ENCOUNTER — Telehealth: Payer: Self-pay | Admitting: *Deleted

## 2019-04-10 ENCOUNTER — Ambulatory Visit (INDEPENDENT_AMBULATORY_CARE_PROVIDER_SITE_OTHER): Payer: Medicare Other | Admitting: Internal Medicine

## 2019-04-10 DIAGNOSIS — E1122 Type 2 diabetes mellitus with diabetic chronic kidney disease: Secondary | ICD-10-CM

## 2019-04-10 DIAGNOSIS — N183 Chronic kidney disease, stage 3 unspecified: Secondary | ICD-10-CM

## 2019-04-10 DIAGNOSIS — N1832 Chronic kidney disease, stage 3b: Secondary | ICD-10-CM

## 2019-04-10 DIAGNOSIS — Z79899 Other long term (current) drug therapy: Secondary | ICD-10-CM | POA: Diagnosis not present

## 2019-04-10 DIAGNOSIS — I129 Hypertensive chronic kidney disease with stage 1 through stage 4 chronic kidney disease, or unspecified chronic kidney disease: Secondary | ICD-10-CM

## 2019-04-10 DIAGNOSIS — I10 Essential (primary) hypertension: Secondary | ICD-10-CM

## 2019-04-10 LAB — RENAL FUNCTION PANEL
Albumin: 3.5 g/dL (ref 3.5–5.0)
Anion gap: 9 (ref 5–15)
BUN: 42 mg/dL — ABNORMAL HIGH (ref 8–23)
CO2: 21 mmol/L — ABNORMAL LOW (ref 22–32)
Calcium: 9.7 mg/dL (ref 8.9–10.3)
Chloride: 112 mmol/L — ABNORMAL HIGH (ref 98–111)
Creatinine, Ser: 2.94 mg/dL — ABNORMAL HIGH (ref 0.61–1.24)
GFR calc Af Amer: 25 mL/min — ABNORMAL LOW (ref >60)
GFR calc non Af Amer: 21 mL/min — ABNORMAL LOW (ref >60)
Glucose, Bld: 90 mg/dL (ref 70–99)
Phosphorus: 3.4 mg/dL (ref 2.5–4.6)
Potassium: 4.9 mmol/L (ref 3.5–5.1)
Sodium: 142 mmol/L (ref 135–145)

## 2019-04-10 MED ORDER — HYDRALAZINE HCL 50 MG PO TABS: 50.0000 mg | ORAL_TABLET | Freq: Three times a day (TID) | ORAL | 1 refills | Status: DC

## 2019-04-10 NOTE — Progress Notes (Signed)
Internal Medicine Clinic Attending  I saw and evaluated the patient.  I personally confirmed the key portions of the history and exam documented by Dr. MacLean and I reviewed pertinent patient test results.  The assessment, diagnosis, and plan were formulated together and I agree with the documentation in the resident's note.  

## 2019-04-10 NOTE — Progress Notes (Signed)
   CC: Follow-up on hypertension and kidney function  HPI: Patient is a 63 year old male with past medical history of hypertension, diabetes, CKD who presents for follow-up on medical problems.  Mr.Brnadon B Russett is a 63 y.o.   Past Medical History:  Diagnosis Date  . Cardiomyopathy    Resolved with EF 55% 2011  . CARDIOMYOPATHY 04/22/2006  . CARDIOMYOPATHY 04/22/2006   2- D Echo (04/2010) : The EF is probably 55% with some hypokinesis at the base of the inferior wall. Wall thickness was increased in a pattern of mild LVH. Patient followed by Dr Martinique (Cardiology)     . Diabetes mellitus   . DIABETIC  RETINOPATHY 02/06/2007  . GERD (gastroesophageal reflux disease)   . Hyperlipemia   . Hypertension   . Iron deficiency 04/09/2018  . NEPHROPATHY, DIABETIC 06/08/2006  . Paroxysmal atrial tachycardia (HCC)    Review of Systems:   Review of Systems  Constitutional: Negative for chills and fever.  HENT: Negative for congestion.   Respiratory: Negative for cough and shortness of breath.   Cardiovascular: Negative for chest pain.  Gastrointestinal: Negative for abdominal pain, constipation, diarrhea, nausea and vomiting.  Genitourinary: Negative for dysuria, frequency and urgency.  All other systems reviewed and are negative.   Physical Exam:  Vitals:   04/10/19 1026  BP: 138/72  Pulse: 70  Temp: 98.4 F (36.9 C)  TempSrc: Oral  SpO2: 100%  Weight: 291 lb 1.6 oz (132 kg)  Height: 5\' 11"  (1.803 m)   Physical Exam  Constitutional: He is well-developed, well-nourished, and in no distress.  HENT:  Head: Normocephalic and atraumatic.  Eyes: EOM are normal. Right eye exhibits no discharge. Left eye exhibits no discharge.  Neck: Normal range of motion. No tracheal deviation present.  Cardiovascular: Normal rate and regular rhythm. Exam reveals no gallop and no friction rub.  No murmur heard. Pulmonary/Chest: Effort normal and breath sounds normal. No respiratory distress. He has  no wheezes. He has no rales.  Abdominal: Soft. He exhibits no distension. There is no abdominal tenderness. There is no rebound and no guarding.  Musculoskeletal: Normal range of motion.        General: Edema (1+ bilateral pitting edema to bilateral shins) present. No tenderness or deformity.  Neurological: He is alert. Coordination normal.  Skin: Skin is warm and dry. No rash noted. He is not diaphoretic. No erythema.  Psychiatric: Memory and judgment normal.    Assessment & Plan:   See Encounters Tab for problem based charting.  Patient seen and examined with Dr. Lynnae January

## 2019-04-10 NOTE — Patient Instructions (Addendum)
You were seen for follow-up on your kidney function.  Your kidney function has improved since last visit.  However, it is still not normal we want you to see a kidney doctor.  The referral has been sent and they were reviewing your paperwork.  They will call you to schedule an appointment.  For your blood pressure, it was still a little bit too high.  We have increased your hydralazine to 50 mg 3 times daily.  We have sent a prescription for 50 mg hydralazine tablets to your pharmacy.  You can use your remaining 25 mg tablets by taking 2 of these (total 50 mg) 3 times daily.  Please start taking your furosemide 20 mg every day instead of taking it as needed.  Thank you for allowing [place] to be part of your medical care

## 2019-04-10 NOTE — Assessment & Plan Note (Signed)
Creatinine trend: 2.94 (today) <- 3.46 (03/23/19) <- 2.38 (11/20/18).  After last visit, referral was placed to nephrology.  Patient is awaiting call from nephrology clinic.  Checked with Mamie, and this is the expected timeframe for a referral. *Patient would benefit from nephrology appointment. *We will work on managing risk factors, see assessment plan for hypertension.

## 2019-04-10 NOTE — Addendum Note (Signed)
Addended by: Guadlupe Spanish B on: 04/10/2019 02:31 AM   Modules accepted: Orders

## 2019-04-10 NOTE — Addendum Note (Signed)
Addended by: Truddie Crumble on: 04/10/2019 10:15 AM   Modules accepted: Orders

## 2019-04-10 NOTE — Assessment & Plan Note (Addendum)
Lisinopril was stopped given patient's worsening renal function. Patient without chest pain, shortness of breath. Taking lasix approximately twice per week. Patient's blood pressure of 138/72 today, 140/60 at last visit.  Given patient risk factors of diabetes mellitus and chronic kidney disease, goal is SBP less than 130. BMP with improved renal function, without significant electrolyte abnormality.  * Increase hydralazine to 50 mg TID (from 25 mg TID) * Take lasix 20 mg QD instead of PRN * Continue Carvedilol 25 mg BID + Diltiazem 60 mg BID * Followup in 1 week for BMP (given increase lasix dose) and BP check

## 2019-04-10 NOTE — Telephone Encounter (Signed)
Called pt, scheduled at 1045 after lab appt per dr Shan Levans

## 2019-04-10 NOTE — Progress Notes (Signed)
hyd

## 2019-04-11 NOTE — Telephone Encounter (Signed)
Thank you all for your help.  

## 2019-04-17 ENCOUNTER — Ambulatory Visit (INDEPENDENT_AMBULATORY_CARE_PROVIDER_SITE_OTHER): Payer: Medicare Other | Admitting: Internal Medicine

## 2019-04-17 ENCOUNTER — Encounter: Payer: Self-pay | Admitting: Internal Medicine

## 2019-04-17 VITALS — BP 142/67 | HR 72 | Temp 98.2°F | Ht 71.0 in | Wt 294.8 lb

## 2019-04-17 DIAGNOSIS — Z8679 Personal history of other diseases of the circulatory system: Secondary | ICD-10-CM | POA: Diagnosis not present

## 2019-04-17 DIAGNOSIS — R609 Edema, unspecified: Secondary | ICD-10-CM

## 2019-04-17 DIAGNOSIS — I129 Hypertensive chronic kidney disease with stage 1 through stage 4 chronic kidney disease, or unspecified chronic kidney disease: Secondary | ICD-10-CM

## 2019-04-17 DIAGNOSIS — I10 Essential (primary) hypertension: Secondary | ICD-10-CM

## 2019-04-17 DIAGNOSIS — N183 Chronic kidney disease, stage 3 unspecified: Secondary | ICD-10-CM

## 2019-04-17 DIAGNOSIS — N189 Chronic kidney disease, unspecified: Secondary | ICD-10-CM

## 2019-04-17 DIAGNOSIS — E1122 Type 2 diabetes mellitus with diabetic chronic kidney disease: Secondary | ICD-10-CM

## 2019-04-17 DIAGNOSIS — Z79899 Other long term (current) drug therapy: Secondary | ICD-10-CM

## 2019-04-17 MED ORDER — FUROSEMIDE 20 MG PO TABS: 20.0000 mg | ORAL_TABLET | Freq: Every day | ORAL | 1 refills | Status: DC | PRN

## 2019-04-17 MED ORDER — FUROSEMIDE 20 MG PO TABS: 20.0000 mg | ORAL_TABLET | Freq: Every day | ORAL | 1 refills | Status: DC

## 2019-04-17 NOTE — Assessment & Plan Note (Addendum)
Patient without chest pain, shortness of breath.  Patient's blood pressure today of 148/60, and on recheck 142/67.  Patient's hydralazine was increased last week to 50 mg 3 times daily (from 25 mg 3 times daily).  Patient reports taking Lasix 20 mg every other day, previously instructed to take it every day.    Patient agrees with plan to take Lasix 20 mg daily given continued peripheral edema as before.  No need to check BMP at this time given values on check last week, and lack of significant increase in Lasix utilization from before.  Patient's peripheral edema may be secondary to CKD.  The patient does have history of HFrEF with EF of 25 to 25% in 2007.  On most recent echocardiogram in 6144, patient systolic function was normal with EF of 55 to 60%.   *Lasix 20 mg daily for peripheral edema *Continue hydralazine 50 mg 3 times daily.  Can consider increasing to 75 mg 3 times daily if blood pressure remains high.  Did not increase at this visit, given increase in Lasix. *Continue carvedilol 25 mg twice daily + diltiazem 60 mg twice daily *Echocardiogram ordered to evaluate function in setting of peripheral edema.  If abnormal, can consider cardiology referral. *See back in clinic in 2 weeks for BMP given increase in Lasix.

## 2019-04-17 NOTE — Patient Instructions (Signed)
You were seen in clinic for follow-up on your leg swelling.  Since you still have leg swelling, we want you to take Lasix 20 mg once daily.  Please take this every day.  We have sent a prescription for this to your pharmacy.  We will see you back in 2 weeks and check your electrolytes and kidney function.  We have also ordered an ultrasound of your heart to see if your leg swelling is due to any issues with your heart.  You will be getting a call to schedule this ultrasound.  Thank you for allowing [place] to be part of your medical care!  Please feel free to call the clinic with any questions or concerns.

## 2019-04-17 NOTE — Progress Notes (Signed)
   CC: Followup after lasix increase for peripheral edema  HPI: Patient is a 63 year old male with past medical history of hypertension, diabetes, CKD who presents for 1 week follow-up after Lasix dose was increased for patient's peripheral edema.  [name] is a 63 y.o.   Past Medical History:  Diagnosis Date  . Cardiomyopathy    Resolved with EF 55% 2011  . CARDIOMYOPATHY 04/22/2006  . CARDIOMYOPATHY 04/22/2006   2- D Echo (04/2010) : The EF is probably 55% with some hypokinesis at the base of the inferior wall. Wall thickness was increased in a pattern of mild LVH. Patient followed by Dr Martinique (Cardiology)     . Diabetes mellitus   . DIABETIC  RETINOPATHY 02/06/2007  . GERD (gastroesophageal reflux disease)   . Hyperlipemia   . Hypertension   . Iron deficiency 04/09/2018  . NEPHROPATHY, DIABETIC 06/08/2006  . Paroxysmal atrial tachycardia (HCC)    Review of Systems:   Review of Systems  Constitutional: Negative for chills and fever.  HENT: Negative for congestion.   Respiratory: Negative for cough and shortness of breath.   Cardiovascular: Positive for leg swelling. Negative for chest pain.  Gastrointestinal: Negative for abdominal pain, constipation, diarrhea, nausea and vomiting.  Genitourinary: Negative for dysuria, frequency and urgency.  All other systems reviewed and are negative.   Physical Exam:  Vitals:   04/17/19 0912 04/17/19 1002  BP: (!) 148/60 (!) 142/67  Pulse: 79 72  Temp: 98.2 F (36.8 C)   TempSrc: Oral   SpO2: 99%   Weight: 294 lb 12.8 oz (133.7 kg)   Height: 5\' 11"  (1.803 m)    Physical Exam  Constitutional: He is well-developed, well-nourished, and in no distress.  HENT:  Head: Normocephalic and atraumatic.  Eyes: EOM are normal. Right eye exhibits no discharge. Left eye exhibits no discharge.  Neck: Normal range of motion. No tracheal deviation present.  Cardiovascular: Normal rate and regular rhythm. Exam reveals no gallop and no  friction rub.  No murmur heard. Pulmonary/Chest: Effort normal and breath sounds normal. No respiratory distress. He has no wheezes. He has no rales.  Abdominal: Soft. He exhibits no distension. There is no abdominal tenderness. There is no rebound and no guarding.  Musculoskeletal: Normal range of motion.        General: Edema (2+ pitting edema up to about 2 inches below knees bilaterally, slightly worse on left leg) present. No tenderness or deformity.  Neurological: He is alert. Coordination normal.  Skin: Skin is warm and dry. No rash noted. He is not diaphoretic. No erythema.  Psychiatric: Memory and judgment normal.     Assessment & Plan:   See Encounters Tab for problem based charting.  Patient seen and discussed with Dr. Dareen Piano

## 2019-04-20 ENCOUNTER — Other Ambulatory Visit: Payer: Self-pay

## 2019-04-20 ENCOUNTER — Ambulatory Visit (HOSPITAL_COMMUNITY)
Admission: RE | Admit: 2019-04-20 | Discharge: 2019-04-20 | Disposition: A | Discharge status: Home / Self Care | Payer: Medicare Other | Source: Ambulatory Visit | Attending: Internal Medicine | Admitting: Internal Medicine

## 2019-04-20 DIAGNOSIS — E119 Type 2 diabetes mellitus without complications: Secondary | ICD-10-CM | POA: Insufficient documentation

## 2019-04-20 DIAGNOSIS — I471 Supraventricular tachycardia: Secondary | ICD-10-CM | POA: Diagnosis not present

## 2019-04-20 DIAGNOSIS — R609 Edema, unspecified: Secondary | ICD-10-CM | POA: Diagnosis not present

## 2019-04-20 DIAGNOSIS — I509 Heart failure, unspecified: Secondary | ICD-10-CM | POA: Insufficient documentation

## 2019-04-20 DIAGNOSIS — I110 Hypertensive heart disease with heart failure: Secondary | ICD-10-CM | POA: Diagnosis not present

## 2019-04-20 DIAGNOSIS — E785 Hyperlipidemia, unspecified: Secondary | ICD-10-CM | POA: Insufficient documentation

## 2019-04-20 NOTE — Progress Notes (Signed)
  Echocardiogram 2D Echocardiogram has been performed.  [name] 04/20/2019, 2:40 PM

## 2019-04-22 NOTE — Progress Notes (Signed)
Internal Medicine Clinic Attending  I saw and evaluated the patient.  I personally confirmed the key portions of the history and exam documented by Dr. MacLean and I reviewed pertinent patient test results.  The assessment, diagnosis, and plan were formulated together and I agree with the documentation in the resident's note.  

## 2019-05-04 ENCOUNTER — Ambulatory Visit (INDEPENDENT_AMBULATORY_CARE_PROVIDER_SITE_OTHER): Payer: Medicare Other | Admitting: Internal Medicine

## 2019-05-04 ENCOUNTER — Encounter: Payer: Self-pay | Admitting: Internal Medicine

## 2019-05-04 ENCOUNTER — Other Ambulatory Visit: Payer: Self-pay

## 2019-05-04 VITALS — BP 151/75 | HR 66 | Temp 98.5°F | Ht 71.0 in | Wt 299.9 lb

## 2019-05-04 DIAGNOSIS — I10 Essential (primary) hypertension: Secondary | ICD-10-CM

## 2019-05-04 DIAGNOSIS — I129 Hypertensive chronic kidney disease with stage 1 through stage 4 chronic kidney disease, or unspecified chronic kidney disease: Secondary | ICD-10-CM

## 2019-05-04 DIAGNOSIS — N1832 Chronic kidney disease, stage 3b: Secondary | ICD-10-CM

## 2019-05-04 DIAGNOSIS — N183 Chronic kidney disease, stage 3 unspecified: Secondary | ICD-10-CM | POA: Diagnosis not present

## 2019-05-04 DIAGNOSIS — Z79899 Other long term (current) drug therapy: Secondary | ICD-10-CM | POA: Diagnosis not present

## 2019-05-04 MED ORDER — HYDRALAZINE HCL 50 MG PO TABS: 75.0000 mg | ORAL_TABLET | Freq: Three times a day (TID) | ORAL | 1 refills | Status: DC

## 2019-05-04 NOTE — Assessment & Plan Note (Addendum)
Patient with worsening renal function on recent BMPs. Patient states he received call from nephrologist and has to call back to schedule an appointment. Counseled patient on importance of seeing nephrologist.  Patient peripheral edema is improved from last visit. Will continue with lasix 20 mg QD. Will check renal function/electrolytes on BMP today

## 2019-05-04 NOTE — Assessment & Plan Note (Signed)
BP Readings from Last 3 Encounters:  05/04/19 (!) 151/75  04/17/19 (!) 142/67  04/10/19 138/72   * Patient on regimen of hydralazine 50 mg 3 times daily + carvedilol 25 mg twice daily + diltiazem 60 mg twice daily * Blood pressure still not at goal. Will increase hydralazine to 75 mg 3 times daily. Will continue rest of regimen as before * Echocardiogram obtained this month was without evidence of systolic or diastolic dysfunction. * BMP obtained at this visit due to increased dose of Lasix.

## 2019-05-04 NOTE — Patient Instructions (Addendum)
You were seen today for followup on your blood pressure and leg swelling. We want you to keep taking the lasix 20 mg daily.   Since your blood pressure was higher than we would like, we want you to start taking hydralazine 75 mg three times daily.   We are getting blood work today to check your electrolytes. We will call you if there are any abnormalities.  Thank you for allowing [place] to be part of your medical care!

## 2019-05-04 NOTE — Progress Notes (Signed)
   CC: Blood pressure followup  HPI: Patient is a 63 year old male with past medical history as below who presents for follow-up on blood pressure.  [name] is a 63 y.o.   Past Medical History:  Diagnosis Date  . Cardiomyopathy    Resolved with EF 55% 2011  . CARDIOMYOPATHY 04/22/2006  . CARDIOMYOPATHY 04/22/2006   2- D Echo (04/2010) : The EF is probably 55% with some hypokinesis at the base of the inferior wall. Wall thickness was increased in a pattern of mild LVH. Patient followed by Dr Martinique (Cardiology)     . Diabetes mellitus   . DIABETIC  RETINOPATHY 02/06/2007  . GERD (gastroesophageal reflux disease)   . Hyperlipemia   . Hypertension   . Iron deficiency 04/09/2018  . NEPHROPATHY, DIABETIC 06/08/2006  . Paroxysmal atrial tachycardia (HCC)    Review of Systems:   Review of Systems  Constitutional: Negative for chills and fever.  HENT: Negative for congestion.   Respiratory: Negative for cough and shortness of breath.   Cardiovascular: Negative for chest pain.  Gastrointestinal: Negative for abdominal pain, constipation, diarrhea, nausea and vomiting.  All other systems reviewed and are negative.   Physical Exam:  Vitals:   05/04/19 0933 05/04/19 1004  BP: (!) 172/74 (!) 151/75  Pulse: 65 66  Temp: 98.5 F (36.9 C)   TempSrc: Oral   SpO2: 92%   Weight: 299 lb 14.4 oz (136 kg)   Height: 5\' 11"  (1.803 m)    Physical Exam  Constitutional: He is well-developed, well-nourished, and in no distress.  HENT:  Head: Normocephalic and atraumatic.  Eyes: EOM are normal. Right eye exhibits no discharge. Left eye exhibits no discharge.  Neck: Normal range of motion. No tracheal deviation present.  Cardiovascular: Normal rate and regular rhythm. Exam reveals no gallop and no friction rub.  No murmur heard. Pulmonary/Chest: Effort normal and breath sounds normal. No respiratory distress. He has no wheezes. He has no rales.  Abdominal: Soft. He exhibits no  distension. There is no abdominal tenderness. There is no rebound and no guarding.  Musculoskeletal: Normal range of motion.        General: Edema (2+ peripheral edema up to mid shins) present. No tenderness or deformity.  Neurological: He is alert. Coordination normal.  Skin: Skin is warm and dry. No rash noted. He is not diaphoretic. No erythema.  Psychiatric: Memory and judgment normal.    Assessment & Plan:   See Encounters Tab for problem based charting.  Patient seen and discussed with Dr. Rebeca Alert

## 2019-05-05 LAB — BMP8+ANION GAP
Anion Gap: 15 mmol/L (ref 10.0–18.0)
BUN/Creatinine Ratio: 19 (ref 10–24)
BUN: 58 mg/dL — ABNORMAL HIGH (ref 8–27)
CO2: 17 mmol/L — ABNORMAL LOW (ref 20–29)
Calcium: 9.3 mg/dL (ref 8.6–10.2)
Chloride: 110 mmol/L — ABNORMAL HIGH (ref 96–106)
Creatinine, Ser: 3.08 mg/dL — ABNORMAL HIGH (ref 0.76–1.27)
GFR calc Af Amer: 23 mL/min/{1.73_m2} — ABNORMAL LOW (ref >59)
GFR calc non Af Amer: 20 mL/min/{1.73_m2} — ABNORMAL LOW (ref >59)
Glucose: 95 mg/dL (ref 65–99)
Potassium: 4.8 mmol/L (ref 3.5–5.2)
Sodium: 142 mmol/L (ref 134–144)

## 2019-05-05 NOTE — Progress Notes (Signed)
Internal Medicine Clinic Attending  I saw and evaluated the patient.  I personally confirmed the key portions of the history and exam documented by Dr. MacLean and I reviewed pertinent patient test results.  The assessment, diagnosis, and plan were formulated together and I agree with the documentation in the resident's note.  Alexander Raines, M.D., Ph.D.  

## 2019-05-19 DIAGNOSIS — N184 Chronic kidney disease, stage 4 (severe): Secondary | ICD-10-CM | POA: Diagnosis not present

## 2019-05-19 DIAGNOSIS — E1122 Type 2 diabetes mellitus with diabetic chronic kidney disease: Secondary | ICD-10-CM | POA: Diagnosis not present

## 2019-05-19 DIAGNOSIS — I129 Hypertensive chronic kidney disease with stage 1 through stage 4 chronic kidney disease, or unspecified chronic kidney disease: Secondary | ICD-10-CM | POA: Diagnosis not present

## 2019-05-26 ENCOUNTER — Other Ambulatory Visit: Payer: Self-pay | Admitting: Internal Medicine

## 2019-05-26 DIAGNOSIS — I10 Essential (primary) hypertension: Secondary | ICD-10-CM

## 2019-06-01 ENCOUNTER — Ambulatory Visit (INDEPENDENT_AMBULATORY_CARE_PROVIDER_SITE_OTHER): Payer: Medicare Other | Admitting: Internal Medicine

## 2019-06-01 VITALS — BP 148/67 | HR 70 | Temp 97.8°F | Ht 71.0 in | Wt 296.8 lb

## 2019-06-01 DIAGNOSIS — Z79899 Other long term (current) drug therapy: Secondary | ICD-10-CM | POA: Diagnosis not present

## 2019-06-01 DIAGNOSIS — IMO0002 Reserved for concepts with insufficient information to code with codable children: Secondary | ICD-10-CM

## 2019-06-01 DIAGNOSIS — Z794 Long term (current) use of insulin: Secondary | ICD-10-CM | POA: Diagnosis not present

## 2019-06-01 DIAGNOSIS — I10 Essential (primary) hypertension: Secondary | ICD-10-CM | POA: Diagnosis not present

## 2019-06-01 DIAGNOSIS — E1165 Type 2 diabetes mellitus with hyperglycemia: Secondary | ICD-10-CM | POA: Diagnosis not present

## 2019-06-01 DIAGNOSIS — E119 Type 2 diabetes mellitus without complications: Secondary | ICD-10-CM

## 2019-06-01 DIAGNOSIS — E118 Type 2 diabetes mellitus with unspecified complications: Secondary | ICD-10-CM

## 2019-06-01 DIAGNOSIS — N183 Chronic kidney disease, stage 3 unspecified: Secondary | ICD-10-CM

## 2019-06-01 LAB — POCT GLYCOSYLATED HEMOGLOBIN (HGB A1C): Hemoglobin A1C: 9.2 % — AB (ref 4.0–5.6)

## 2019-06-01 LAB — GLUCOSE, CAPILLARY: Glucose-Capillary: 77 mg/dL (ref 70–99)

## 2019-06-01 MED ORDER — FUROSEMIDE 20 MG PO TABS: 20.0000 mg | ORAL_TABLET | Freq: Every day | ORAL | 1 refills | Status: DC

## 2019-06-01 MED ORDER — DILTIAZEM HCL ER 60 MG PO CP12: 60.0000 mg | ORAL_CAPSULE | Freq: Two times a day (BID) | ORAL | 0 refills | Status: DC

## 2019-06-01 NOTE — Progress Notes (Signed)
Internal Medicine Clinic Attending  Case discussed with Dr. Winfrey  at the time of the visit.  We reviewed the resident's history and exam and pertinent patient test results.  I agree with the assessment, diagnosis, and plan of care documented in the resident's note.  

## 2019-06-01 NOTE — Patient Instructions (Addendum)
Mr. Prestwood you're doing well, we will make no changes to your medications today.  Please begin to wear your compression stockings.  Please obtain a blood pressure cuff and begin taking your blood pressure at home and bring me a log of how it's doing.  Bring your meter at your next visit and we will see if we need to adjust your insulin.

## 2019-06-01 NOTE — Assessment & Plan Note (Addendum)
HTN: currently taking lasix 20mg  daily, hydralazine 75mg  TID, diltiazem 60mg  BID, carvedilol 25 BID.  Last visit his bp was better however still not at goal his hydralazine was increased from 50 to 75mg  TID.  His bp is much improved.  Given his advanced renal disease I know it will not be completely normal and I expect as he continues to get more volume off it may continue to improve.    -continue current regimen above

## 2019-06-01 NOTE — Assessment & Plan Note (Signed)
T2DM: he is on renally dosed metformin, victoza 1.8mg , and 63u 70/30 insulin BID.  His fasting cbg this am was 77 he thought he needed to fast for his labs.  His A1C is 9.2 which is improved since last.  He did not bring his meter since he felt this was a bp follow up visit.  He is coming close to the point where we will have to discontinue his metformin and glp-1 agonist therapy however currently he has had no side effects and his renal function is stable.    -continue current regimen above -can adjust insulin if needed at next visit continue to monitor renal function

## 2019-06-01 NOTE — Progress Notes (Signed)
CC: HTN, T2DM follow up  HPI:  [name] is a 63 y.o. male with PMH below.  Today we will address HTN, T2DM  Please see A&P for status of the patient's chronic medical conditions  Past Medical History:  Diagnosis Date  . Cardiomyopathy    Resolved with EF 55% 2011  . CARDIOMYOPATHY 04/22/2006  . CARDIOMYOPATHY 04/22/2006   2- D Echo (04/2010) : The EF is probably 55% with some hypokinesis at the base of the inferior wall. Wall thickness was increased in a pattern of mild LVH. Patient followed by Dr Martinique (Cardiology)     . Diabetes mellitus   . DIABETIC  RETINOPATHY 02/06/2007  . GERD (gastroesophageal reflux disease)   . Hyperlipemia   . Hypertension   . Iron deficiency 04/09/2018  . NEPHROPATHY, DIABETIC 06/08/2006  . Paroxysmal atrial tachycardia (HCC)    Review of Systems:  ROS: Pulmonary: pt denies increased work of breathing, shortness of breath,  Cardiac: pt denies palpitations, chest pain,  Abdominal: pt denies abdominal pain, nausea, vomiting, or diarrhea   Physical Exam:  Vitals:   06/01/19 0918  BP: (!) 148/67  Pulse: 70  Temp: 97.8 F (36.6 C)  TempSrc: Oral  SpO2: 98%  Weight: 296 lb 12.8 oz (134.6 kg)  Height: 5\' 11"  (1.803 m)   Cardiac: JVD flat, normal rate and rhythm, clear s1 and s2, no murmurs, rubs or gallops, 1-2+ LE edema equal bilaterally which is improved from prior exam Pulmonary: CTAB, not in distress Abdominal: non distended abdomen, soft and nontender Psych: Alert, conversant, in good spirits   Social History   Socioeconomic History  . Marital status: Single    Spouse name: Not on file  . Number of children: Not on file  . Years of education: Not on file  . Highest education level: Not on file  Occupational History  . Not on file  Tobacco Use  . Smoking status: Never Smoker  . Smokeless tobacco: Never Used  Substance and Sexual Activity  . Alcohol use: No    Alcohol/week: 0.0 standard drinks  . Drug use: No  .  Sexual activity: Not on file  Other Topics Concern  . Not on file  Social History Narrative  . Not on file   Social Determinants of Health   Financial Resource Strain:   . Difficulty of Paying Living Expenses: Not on file  Food Insecurity:   . Worried About Charity fundraiser in the Last Year: Not on file  . Ran Out of Food in the Last Year: Not on file  Transportation Needs:   . Lack of Transportation (Medical): Not on file  . Lack of Transportation (Non-Medical): Not on file  Physical Activity:   . Days of Exercise per Week: Not on file  . Minutes of Exercise per Session: Not on file  Stress:   . Feeling of Stress : Not on file  Social Connections:   . Frequency of Communication with Friends and Family: Not on file  . Frequency of Social Gatherings with Friends and Family: Not on file  . Attends Religious Services: Not on file  . Active Member of Clubs or Organizations: Not on file  . Attends Archivist Meetings: Not on file  . Marital Status: Not on file  Intimate Partner Violence:   . Fear of Current or Ex-Partner: Not on file  . Emotionally Abused: Not on file  . Physically Abused: Not on file  . Sexually Abused: Not  on file    Family History  Problem Relation Age of Onset  . Diabetes Mother   . Diabetes Brother     Assessment & Plan:   See Encounters Tab for problem based charting.  Patient discussed with Dr. Evette Doffing

## 2019-06-12 ENCOUNTER — Encounter: Payer: Self-pay | Admitting: Podiatry

## 2019-06-12 ENCOUNTER — Ambulatory Visit: Payer: Medicare Other | Admitting: Podiatry

## 2019-06-12 ENCOUNTER — Other Ambulatory Visit: Payer: Self-pay

## 2019-06-12 DIAGNOSIS — B351 Tinea unguium: Secondary | ICD-10-CM | POA: Diagnosis not present

## 2019-06-12 DIAGNOSIS — E1159 Type 2 diabetes mellitus with other circulatory complications: Secondary | ICD-10-CM

## 2019-06-12 DIAGNOSIS — M79674 Pain in right toe(s): Secondary | ICD-10-CM | POA: Diagnosis not present

## 2019-06-12 DIAGNOSIS — M79675 Pain in left toe(s): Secondary | ICD-10-CM | POA: Diagnosis not present

## 2019-06-12 NOTE — Progress Notes (Signed)
Complaint:  Visit Type: Patient returns to my office for continued preventative foot care services. Complaint: Patient states" my nails have grown long and thick and become painful to walk and wear shoes" Patient has been diagnosed with DM with no foot complications. The patient presents for preventative foot care services.  Podiatric Exam: Vascular: dorsalis pedis and posterior tibial pulses are not  palpable bilateral due to swelling.. Capillary return is immediate. Temperature gradient is WNL. Skin turgor WNL  Sensorium: Normal Semmes Weinstein monofilament test. Normal tactile sensation bilaterally. Nail Exam: Pt has thick disfigured discolored nails with subungual debris noted bilateral entire nail hallux through fifth toenails Ulcer Exam: There is no evidence of ulcer or pre-ulcerative changes or infection. Orthopedic Exam: Muscle tone and strength are WNL. No limitations in general ROM. No crepitus or effusions noted. Foot type and digits show no abnormalities. Bony prominences are unremarkable. Skin: No Porokeratosis. No infection or ulcers  Diagnosis:  Onychomycosis, , Pain in right toe, pain in left toes  Treatment & Plan Procedures and Treatment: Consent by patient was obtained for treatment procedures.   Debridement of mycotic and hypertrophic toenails, 1 through 5 bilateral and clearing of subungual debris. No ulceration, no infection noted.  Return Visit-Office Procedure: Patient instructed to return to the office for a follow up visit 3 months for continued evaluation and treatment.    Gardiner Barefoot DPM

## 2019-06-17 ENCOUNTER — Other Ambulatory Visit: Payer: Self-pay | Admitting: Internal Medicine

## 2019-06-17 DIAGNOSIS — IMO0002 Reserved for concepts with insufficient information to code with codable children: Secondary | ICD-10-CM

## 2019-06-17 DIAGNOSIS — E1165 Type 2 diabetes mellitus with hyperglycemia: Secondary | ICD-10-CM

## 2019-06-17 NOTE — Telephone Encounter (Signed)
Unfortunately his renal function has continued to decline to the point where we can no longer use metformin.  I will notify the patient.

## 2019-06-26 DIAGNOSIS — I10 Essential (primary) hypertension: Secondary | ICD-10-CM | POA: Diagnosis not present

## 2019-06-26 DIAGNOSIS — E1169 Type 2 diabetes mellitus with other specified complication: Secondary | ICD-10-CM | POA: Diagnosis not present

## 2019-06-26 DIAGNOSIS — I480 Paroxysmal atrial fibrillation: Secondary | ICD-10-CM | POA: Diagnosis not present

## 2019-07-21 ENCOUNTER — Other Ambulatory Visit: Payer: Self-pay | Admitting: Internal Medicine

## 2019-07-21 DIAGNOSIS — E1165 Type 2 diabetes mellitus with hyperglycemia: Secondary | ICD-10-CM

## 2019-07-21 DIAGNOSIS — IMO0002 Reserved for concepts with insufficient information to code with codable children: Secondary | ICD-10-CM

## 2019-07-25 ENCOUNTER — Other Ambulatory Visit: Payer: Self-pay | Admitting: Internal Medicine

## 2019-07-25 DIAGNOSIS — I10 Essential (primary) hypertension: Secondary | ICD-10-CM

## 2019-07-25 DIAGNOSIS — N183 Chronic kidney disease, stage 3 unspecified: Secondary | ICD-10-CM

## 2019-08-21 ENCOUNTER — Other Ambulatory Visit: Payer: Self-pay | Admitting: Internal Medicine

## 2019-08-21 DIAGNOSIS — IMO0002 Reserved for concepts with insufficient information to code with codable children: Secondary | ICD-10-CM

## 2019-08-21 DIAGNOSIS — I10 Essential (primary) hypertension: Secondary | ICD-10-CM

## 2019-08-21 DIAGNOSIS — N183 Chronic kidney disease, stage 3 unspecified: Secondary | ICD-10-CM

## 2019-08-21 DIAGNOSIS — E1165 Type 2 diabetes mellitus with hyperglycemia: Secondary | ICD-10-CM

## 2019-08-23 ENCOUNTER — Other Ambulatory Visit: Payer: Self-pay | Admitting: Internal Medicine

## 2019-08-23 DIAGNOSIS — E1165 Type 2 diabetes mellitus with hyperglycemia: Secondary | ICD-10-CM

## 2019-08-23 DIAGNOSIS — IMO0002 Reserved for concepts with insufficient information to code with codable children: Secondary | ICD-10-CM

## 2019-09-07 ENCOUNTER — Encounter: Payer: Medicare Other | Admitting: Internal Medicine

## 2019-09-11 ENCOUNTER — Ambulatory Visit: Payer: Medicare Other | Admitting: Podiatry

## 2019-09-11 ENCOUNTER — Encounter: Payer: Self-pay | Admitting: Podiatry

## 2019-09-11 ENCOUNTER — Other Ambulatory Visit: Payer: Self-pay

## 2019-09-11 VITALS — Temp 95.3°F

## 2019-09-11 DIAGNOSIS — B351 Tinea unguium: Secondary | ICD-10-CM

## 2019-09-11 DIAGNOSIS — M79675 Pain in left toe(s): Secondary | ICD-10-CM

## 2019-09-11 DIAGNOSIS — N1832 Chronic kidney disease, stage 3b: Secondary | ICD-10-CM

## 2019-09-11 DIAGNOSIS — M79674 Pain in right toe(s): Secondary | ICD-10-CM

## 2019-09-11 DIAGNOSIS — E1159 Type 2 diabetes mellitus with other circulatory complications: Secondary | ICD-10-CM | POA: Diagnosis not present

## 2019-09-11 NOTE — Progress Notes (Signed)
This patient returns to my office for at risk foot care.  This patient requires this care by a professional since this patient will be at risk due to having chronic kidney disease and type 2 diabetes.  This patient is unable to cut nails himself since the patient cannot reach his nails.These nails are painful walking and wearing shoes.  This patient presents for at risk foot care today.  General Appearance  Alert, conversant and in no acute stress.  Vascular  Dorsalis pedis and posterior tibial  pulses are not  palpable  bilaterally.  Capillary return is within normal limits  bilaterally. Temperature is within normal limits  bilaterally.  Neurologic  Senn-Weinstein monofilament wire test within normal limits  bilaterally. Muscle power within normal limits bilaterally.  Nails Thick disfigured discolored nails with subungual debris  from hallux to fifth toes bilaterally. No evidence of bacterial infection or drainage bilaterally.  Orthopedic  No limitations of motion  feet .  No crepitus or effusions noted.  No bony pathology or digital deformities noted.  Skin  normotropic skin with no porokeratosis noted bilaterally.  No signs of infections or ulcers noted.     Onychomycosis  Pain in right toes  Pain in left toes  Consent was obtained for treatment procedures.   Mechanical debridement of nails 1-5  bilaterally performed with a nail nipper.  Filed with dremel without incident.    Return office visit   3 months                    Told patient to return for periodic foot care and evaluation due to potential at risk complications.   Gardiner Barefoot DPM

## 2019-10-16 ENCOUNTER — Other Ambulatory Visit: Payer: Self-pay | Admitting: Internal Medicine

## 2019-10-16 DIAGNOSIS — IMO0002 Reserved for concepts with insufficient information to code with codable children: Secondary | ICD-10-CM

## 2019-10-16 DIAGNOSIS — E1165 Type 2 diabetes mellitus with hyperglycemia: Secondary | ICD-10-CM

## 2019-11-16 ENCOUNTER — Other Ambulatory Visit: Payer: Self-pay | Admitting: Internal Medicine

## 2019-11-24 ENCOUNTER — Other Ambulatory Visit: Payer: Self-pay | Admitting: Internal Medicine

## 2019-11-24 DIAGNOSIS — E1165 Type 2 diabetes mellitus with hyperglycemia: Secondary | ICD-10-CM

## 2019-11-24 DIAGNOSIS — IMO0002 Reserved for concepts with insufficient information to code with codable children: Secondary | ICD-10-CM

## 2019-12-11 ENCOUNTER — Other Ambulatory Visit: Payer: Self-pay | Admitting: Internal Medicine

## 2019-12-11 ENCOUNTER — Ambulatory Visit: Payer: Medicare Other | Admitting: Podiatry

## 2019-12-11 DIAGNOSIS — IMO0002 Reserved for concepts with insufficient information to code with codable children: Secondary | ICD-10-CM

## 2019-12-11 DIAGNOSIS — E1165 Type 2 diabetes mellitus with hyperglycemia: Secondary | ICD-10-CM

## 2019-12-22 ENCOUNTER — Other Ambulatory Visit: Payer: Self-pay | Admitting: Internal Medicine

## 2019-12-22 DIAGNOSIS — E1165 Type 2 diabetes mellitus with hyperglycemia: Secondary | ICD-10-CM

## 2019-12-22 DIAGNOSIS — IMO0002 Reserved for concepts with insufficient information to code with codable children: Secondary | ICD-10-CM

## 2020-02-26 ENCOUNTER — Encounter: Payer: Self-pay | Admitting: Podiatry

## 2020-02-26 ENCOUNTER — Ambulatory Visit: Payer: Medicare Other | Admitting: Podiatry

## 2020-02-26 ENCOUNTER — Other Ambulatory Visit: Payer: Self-pay

## 2020-02-26 DIAGNOSIS — B351 Tinea unguium: Secondary | ICD-10-CM | POA: Diagnosis not present

## 2020-02-26 DIAGNOSIS — E1159 Type 2 diabetes mellitus with other circulatory complications: Secondary | ICD-10-CM

## 2020-02-26 DIAGNOSIS — M79674 Pain in right toe(s): Secondary | ICD-10-CM

## 2020-02-26 DIAGNOSIS — N1832 Chronic kidney disease, stage 3b: Secondary | ICD-10-CM

## 2020-02-26 DIAGNOSIS — M79675 Pain in left toe(s): Secondary | ICD-10-CM

## 2020-02-26 NOTE — Progress Notes (Signed)
This patient returns to my office for at risk foot care.  This patient requires this care by a professional since this patient will be at risk due to having chronic kidney disease and type 2 diabetes.  This patient is unable to cut nails himself since the patient cannot reach his nails.These nails are painful walking and wearing shoes.  This patient presents for at risk foot care today. ° °General Appearance  Alert, conversant and in no acute stress. ° °Vascular  Dorsalis pedis and posterior tibial  pulses are not  palpable  bilaterally.  Capillary return is within normal limits  bilaterally. Temperature is within normal limits  bilaterally. ° °Neurologic  Senn-Weinstein monofilament wire test within normal limits  bilaterally. Muscle power within normal limits bilaterally. ° °Nails Thick disfigured discolored nails with subungual debris  from hallux to fifth toes bilaterally. No evidence of bacterial infection or drainage bilaterally. ° °Orthopedic  No limitations of motion  feet .  No crepitus or effusions noted.  No bony pathology or digital deformities noted. ° °Skin  normotropic skin with no porokeratosis noted bilaterally.  No signs of infections or ulcers noted.    ° °Onychomycosis  Pain in right toes  Pain in left toes ° °Consent was obtained for treatment procedures.   Mechanical debridement of nails 1-5  bilaterally performed with a nail nipper.  Filed with dremel without incident.  ° ° °Return office visit   4 months                    Told patient to return for periodic foot care and evaluation due to potential at risk complications. ° ° °Ayame Rena DPM  °

## 2020-07-01 ENCOUNTER — Ambulatory Visit: Payer: Medicare Other | Admitting: Podiatry

## 2020-07-22 ENCOUNTER — Ambulatory Visit: Payer: Medicare Other | Admitting: Podiatry

## 2020-07-22 ENCOUNTER — Other Ambulatory Visit: Payer: Self-pay

## 2020-07-22 ENCOUNTER — Encounter: Payer: Self-pay | Admitting: Podiatry

## 2020-07-22 DIAGNOSIS — B351 Tinea unguium: Secondary | ICD-10-CM | POA: Diagnosis not present

## 2020-07-22 DIAGNOSIS — M79675 Pain in left toe(s): Secondary | ICD-10-CM | POA: Diagnosis not present

## 2020-07-22 DIAGNOSIS — N1832 Chronic kidney disease, stage 3b: Secondary | ICD-10-CM | POA: Diagnosis not present

## 2020-07-22 DIAGNOSIS — M79674 Pain in right toe(s): Secondary | ICD-10-CM

## 2020-07-22 DIAGNOSIS — E1159 Type 2 diabetes mellitus with other circulatory complications: Secondary | ICD-10-CM | POA: Diagnosis not present

## 2020-07-22 NOTE — Progress Notes (Signed)
This patient returns to my office for at risk foot care.  This patient requires this care by a professional since this patient will be at risk due to having chronic kidney disease and type 2 diabetes.  This patient is unable to cut nails himself since the patient cannot reach his nails.These nails are painful walking and wearing shoes.  This patient presents for at risk foot care today.  General Appearance  Alert, conversant and in no acute stress.  Vascular  Dorsalis pedis and posterior tibial  pulses are not  palpable  bilaterally.  Capillary return is within normal limits  bilaterally. Temperature is within normal limits  bilaterally.  Neurologic  Senn-Weinstein monofilament wire test within normal limits  bilaterally. Muscle power within normal limits bilaterally.  Nails Thick disfigured discolored nails with subungual debris  from hallux to fifth toes bilaterally. No evidence of bacterial infection or drainage bilaterally.  Orthopedic  No limitations of motion  feet .  No crepitus or effusions noted.  No bony pathology or digital deformities noted.  Skin  normotropic skin with no porokeratosis noted bilaterally.  No signs of infections or ulcers noted.     Onychomycosis  Pain in right toes  Pain in left toes  Consent was obtained for treatment procedures.   Mechanical debridement of nails 1-5  bilaterally performed with a nail nipper.  Filed with dremel without incident.    Return office visit   4 months                    Told patient to return for periodic foot care and evaluation due to potential at risk complications.   Gardiner Barefoot DPM

## 2020-11-23 ENCOUNTER — Encounter: Payer: Self-pay | Admitting: Podiatry

## 2020-11-23 ENCOUNTER — Ambulatory Visit: Payer: Medicare Other | Admitting: Podiatry

## 2020-11-23 ENCOUNTER — Other Ambulatory Visit: Payer: Self-pay

## 2020-11-23 DIAGNOSIS — N1832 Chronic kidney disease, stage 3b: Secondary | ICD-10-CM | POA: Diagnosis not present

## 2020-11-23 DIAGNOSIS — B351 Tinea unguium: Secondary | ICD-10-CM | POA: Diagnosis not present

## 2020-11-23 DIAGNOSIS — M79674 Pain in right toe(s): Secondary | ICD-10-CM

## 2020-11-23 DIAGNOSIS — M79675 Pain in left toe(s): Secondary | ICD-10-CM

## 2020-11-23 DIAGNOSIS — E1159 Type 2 diabetes mellitus with other circulatory complications: Secondary | ICD-10-CM

## 2020-11-23 NOTE — Progress Notes (Signed)
This patient returns to my office for at risk foot care.  This patient requires this care by a professional since this patient will be at risk due to having chronic kidney disease and type 2 diabetes.  This patient is unable to cut nails himself since the patient cannot reach his nails.These nails are painful walking and wearing shoes.  This patient presents for at risk foot care today. ° °General Appearance  Alert, conversant and in no acute stress. ° °Vascular  Dorsalis pedis and posterior tibial  pulses are not  palpable  bilaterally.  Capillary return is within normal limits  bilaterally. Temperature is within normal limits  bilaterally. ° °Neurologic  Senn-Weinstein monofilament wire test within normal limits  bilaterally. Muscle power within normal limits bilaterally. ° °Nails Thick disfigured discolored nails with subungual debris  from hallux to fifth toes bilaterally. No evidence of bacterial infection or drainage bilaterally. ° °Orthopedic  No limitations of motion  feet .  No crepitus or effusions noted.  No bony pathology or digital deformities noted. ° °Skin  normotropic skin with no porokeratosis noted bilaterally.  No signs of infections or ulcers noted.    ° °Onychomycosis  Pain in right toes  Pain in left toes ° °Consent was obtained for treatment procedures.   Mechanical debridement of nails 1-5  bilaterally performed with a nail nipper.  Filed with dremel without incident.  ° ° °Return office visit   4 months                    Told patient to return for periodic foot care and evaluation due to potential at risk complications. ° ° °Mari Battaglia DPM  °

## 2021-03-29 ENCOUNTER — Other Ambulatory Visit: Payer: Self-pay

## 2021-03-29 ENCOUNTER — Ambulatory Visit: Payer: Medicare Other | Admitting: Podiatry

## 2021-03-29 ENCOUNTER — Encounter: Payer: Self-pay | Admitting: Podiatry

## 2021-03-29 DIAGNOSIS — M79675 Pain in left toe(s): Secondary | ICD-10-CM

## 2021-03-29 DIAGNOSIS — B351 Tinea unguium: Secondary | ICD-10-CM | POA: Diagnosis not present

## 2021-03-29 DIAGNOSIS — E1159 Type 2 diabetes mellitus with other circulatory complications: Secondary | ICD-10-CM

## 2021-03-29 DIAGNOSIS — M79674 Pain in right toe(s): Secondary | ICD-10-CM

## 2021-03-29 DIAGNOSIS — N1832 Chronic kidney disease, stage 3b: Secondary | ICD-10-CM

## 2021-03-29 NOTE — Progress Notes (Signed)
This patient returns to my office for at risk foot care.  This patient requires this care by a professional since this patient will be at risk due to having chronic kidney disease and type 2 diabetes.  This patient is unable to cut nails himself since the patient cannot reach his nails.These nails are painful walking and wearing shoes.  This patient presents for at risk foot care today. ° °General Appearance  Alert, conversant and in no acute stress. ° °Vascular  Dorsalis pedis and posterior tibial  pulses are not  palpable  bilaterally.  Capillary return is within normal limits  bilaterally. Temperature is within normal limits  bilaterally. ° °Neurologic  Senn-Weinstein monofilament wire test within normal limits  bilaterally. Muscle power within normal limits bilaterally. ° °Nails Thick disfigured discolored nails with subungual debris  from hallux to fifth toes bilaterally. No evidence of bacterial infection or drainage bilaterally. ° °Orthopedic  No limitations of motion  feet .  No crepitus or effusions noted.  No bony pathology or digital deformities noted. ° °Skin  normotropic skin with no porokeratosis noted bilaterally.  No signs of infections or ulcers noted.    ° °Onychomycosis  Pain in right toes  Pain in left toes ° °Consent was obtained for treatment procedures.   Mechanical debridement of nails 1-5  bilaterally performed with a nail nipper.  Filed with dremel without incident.  ° ° °Return office visit   4 months                    Told patient to return for periodic foot care and evaluation due to potential at risk complications. ° ° °Merlyn Bollen DPM  °

## 2021-05-16 DIAGNOSIS — R972 Elevated prostate specific antigen [PSA]: Secondary | ICD-10-CM | POA: Insufficient documentation

## 2021-05-19 ENCOUNTER — Other Ambulatory Visit: Payer: Self-pay

## 2021-05-19 DIAGNOSIS — N183 Chronic kidney disease, stage 3 unspecified: Secondary | ICD-10-CM

## 2021-06-01 NOTE — Progress Notes (Signed)
°Office Note  ° ° ° °CC:  CKD4 °Requesting Provider:  Goldsborough, Kellie, MD ° °HPI: [name] is a Right handed 65 y.o. (02/14/1956) male with kidney disease who presents at the request of Goldsborough, Kellie, MD for permanent HD access. The patient has had no prior access procedures. No tunneled lines. Currently is CKD, not on dialysis.  ° °On exam, [name] was doing well with no complaints.  He had a wonderful Christmas with his family noting how much he loved to be there for his granddaughters. ° °The pt is  on a statin for cholesterol management.  °The pt is  on a daily aspirin.   Other AC:  - °The pt is  on medications for hypertension.   °The pt is  diabetic. °Tobacco hx:  none ° °Past Medical History:  °Diagnosis Date  ° Cardiomyopathy   ° Resolved with EF 55% 2011  ° CARDIOMYOPATHY 04/22/2006  ° CARDIOMYOPATHY 04/22/2006  ° 2- D Echo (04/2010) : The EF is probably 55% with some hypokinesis at the base of the inferior wall. Wall thickness was increased in a pattern of mild LVH. Patient followed by Dr Jordan (Cardiology)     ° Diabetes mellitus   ° DIABETIC  RETINOPATHY 02/06/2007  ° GERD (gastroesophageal reflux disease)   ° Hyperlipemia   ° Hypertension   ° Iron deficiency 04/09/2018  ° NEPHROPATHY, DIABETIC 06/08/2006  ° Paroxysmal atrial tachycardia (HCC)   ° ° °Past Surgical History:  °Procedure Laterality Date  ° CARDIAC CATHETERIZATION  Dec 2007  ° normal; EF- 55-60%  ° ° °Social History  ° °Socioeconomic History  ° Marital status: Single  °  Spouse name: Not on file  ° Number of children: Not on file  ° Years of education: Not on file  ° Highest education level: Not on file  °Occupational History  ° Not on file  °Tobacco Use  ° Smoking status: Never  ° Smokeless tobacco: Never  °Substance and Sexual Activity  ° Alcohol use: No  °  Alcohol/week: 0.0 standard drinks  ° Drug use: No  ° Sexual activity: Not on file  °Other Topics Concern  ° Not on file  °Social History Narrative  ° Not on file  ° °Social  Determinants of Health  ° °Financial Resource Strain: Not on file  °Food Insecurity: Not on file  °Transportation Needs: Not on file  °Physical Activity: Not on file  °Stress: Not on file  °Social Connections: Not on file  °Intimate Partner Violence: Not on file  ° ° °Family History  °Problem Relation Age of Onset  ° Diabetes Mother   ° Diabetes Brother   ° ° °Current Outpatient Medications  °Medication Sig Dispense Refill  ° acetaminophen (TYLENOL) 500 MG tablet Take 500 mg by mouth every 6 (six) hours as needed.    ° amoxicillin-clavulanate (AUGMENTIN) 500-125 MG tablet Take 1 tablet by mouth 2 (two) times daily.    ° aspirin 81 MG tablet Take 1 tablet (81 mg total) by mouth daily. 90 tablet 0  ° atorvastatin (LIPITOR) 80 MG tablet Take 1 tablet (80 mg total) by mouth daily. 90 tablet 3  ° BAYER MICROLET LANCETS lancets Check blood sugar 3 times a day as instructed 100 each 12  ° Blood Glucose Monitoring Suppl (ONE TOUCH ULTRA MINI) w/Device KIT 1 strip by Other route 3 (three) times daily. 1 each 11  ° calcitRIOL (ROCALTROL) 0.25 MCG capsule Take 0.25 mcg by mouth daily.    °   carvedilol (COREG) 25 MG tablet TAKE 1 TABLET BY MOUTH TWICE DAILY WITH A MEAL 180 tablet 0   Cholecalciferol 25 MCG (1000 UT) capsule Take 2 capsules (2,000 Units total) by mouth daily. 90 capsule 1   diltiazem (CARDIZEM SR) 60 MG 12 hr capsule Take 1 capsule (60 mg total) by mouth 2 (two) times daily. 180 capsule 0   furosemide (LASIX) 80 MG tablet Take 80 mg by mouth 2 (two) times daily.     glipiZIDE (GLUCOTROL) 10 MG tablet Take 10 mg by mouth 2 (two) times daily.     Glucosamine-Chondroit-Vit C-Mn (GLUCOSAMINE 1500 COMPLEX PO) Take 1,500 mg by mouth daily.     glucose blood (ONE TOUCH ULTRA TEST) test strip The patient is insulin requiring, ICD 10 code E11.65. The patient tests 3 times per day. 100 each 12   hydrALAZINE (APRESOLINE) 50 MG tablet Take 1.5 tablets (75 mg total) by mouth 3 (three) times daily. 270 tablet 1    insulin lispro (HUMALOG) 100 UNIT/ML KwikPen      Insulin Lispro Prot & Lispro (HUMALOG MIX 75/25 KWIKPEN) (75-25) 100 UNIT/ML Kwikpen Inject 63 Units into the skin 2 (two) times daily. 15 mL 11   Insulin Pen Needle 31G X 6 MM MISC 1 Stick by Other route 3 (three) times daily. Use with insulin and liraglutide. 100 each 3   LEVEMIR 100 UNIT/ML injection SMARTSIG:35 Unit(s) SUB-Q Twice Daily     liraglutide (VICTOZA) 18 MG/3ML SOPN Inject 0.3 mLs (1.8 mg total) into the skin daily. 3 pen 11   metFORMIN (GLUCOPHAGE) 1000 MG tablet SMARTSIG:1 Tablet(s) By Mouth Morning-Evening     metFORMIN (GLUCOPHAGE) 500 MG tablet Take 1 tablet (500 mg total) by mouth 2 (two) times daily with a meal. 180 tablet 3   Omega-3 Fatty Acids (FISH OIL) 1200 MG CAPS Take 2 capsules (2,400 mg total) by mouth daily. 60 capsule 11   OZEMPIC, 0.25 OR 0.5 MG/DOSE, 2 MG/1.5ML SOPN Inject 0.5 mg into the skin once a week.     sodium bicarbonate 325 MG tablet Take 1 tablet (325 mg total) by mouth 3 (three) times daily. 269 tablet 0   vitamin B-12 (CYANOCOBALAMIN) 1000 MCG tablet Take 1,000 mcg by mouth daily.     No current facility-administered medications for this visit.    No Known Allergies   REVIEW OF SYSTEMS:   [X] denotes positive finding, [ ] denotes negative finding Cardiac  Comments:  Chest pain or chest pressure:    Shortness of breath upon exertion:    Short of breath when lying flat:    Irregular heart rhythm:        Vascular    Pain in calf, thigh, or hip brought on by ambulation:    Pain in feet at night that wakes you up from your sleep:     Blood clot in your veins:    Leg swelling:         Pulmonary    Oxygen at home:    Productive cough:     Wheezing:         Neurologic    Sudden weakness in arms or legs:     Sudden numbness in arms or legs:     Sudden onset of difficulty speaking or slurred speech:    Temporary loss of vision in one eye:     Problems with dizziness:          Gastrointestinal    Blood in stool:  Vomited blood:     °    °Genitourinary    °Burning when urinating:     °Blood in urine:    °    °Psychiatric    °Major depression:     °    °Hematologic    °Bleeding problems:    °Problems with blood clotting too easily:    °    °Skin    °Rashes or ulcers:    °    °Constitutional    °Fever or chills:    ° ° °PHYSICAL EXAMINATION: ° °There were no vitals filed for this visit. ° °General:  WDWN in NAD; vital signs documented above °Gait: Not observed °HENT: WNL, normocephalic °Pulmonary: normal non-labored breathing , without Rales, rhonchi,  wheezing °Cardiac: regular HR,  °Abdomen: soft, NT, no masses °Skin: without rashes °Vascular Exam/Pulses: ° Right Left  °Radial 2+ (normal) 2+ (normal)  °Ulnar 2+ (normal) 2+ (normal)  °    °    °    °    ° °Extremities: without ischemic changes, without Gangrene , without cellulitis; without open wounds;  °Musculoskeletal: no muscle wasting or atrophy  °Neurologic: A&O X 3;  No focal weakness or paresthesias are detected °Psychiatric:  The pt has Normal affect. ° ° °Non-Invasive Vascular Imaging:   °No arterial obstruction identified in bilateral upper extremities °Bilateral upper extremity vein mapping demonstrated adequately sized cephalic and basilic veins bilaterally. ° ° °ASSESSMENT/PLAN: ° °[name] is a 65 y.o. male who presents with chronic kidney disease stage 4 ° °Based on vein mapping and examination, patient would be best suited for left-sided brachiocephalic fistula, as he is right-handed.. °I had an extensive discussion with this patient in regards to the nature of access surgery, including risk, benefits, and alternatives.   °The patient is aware that the risks of access surgery include but are not limited to: bleeding, infection, steal syndrome, nerve damage, ischemic monomelic neuropathy, failure of access to mature, complications related to venous hypertension, and possible need for additional access procedures  in the future. °I discussed with the patient the nature of the staged access procedure, specifically the need for a second operation to transpose the first stage fistula if it matures adequately.   °The patient has agreed to proceed with the above procedure which will be scheduled. ° °In chart checking the patient further, it was noted that he has a history of nonpalpable pulses in the feet.  No current wounds, but he would benefit from ABI at a later date.  I will schedule this when he has his fistula checked in roughly 1 month. ° °Joshua E Robins, MD °[facility] °[phone]  °

## 2021-06-02 ENCOUNTER — Encounter: Payer: Self-pay | Admitting: Vascular Surgery

## 2021-06-02 ENCOUNTER — Encounter (HOSPITAL_COMMUNITY): Payer: Self-pay | Admitting: Vascular Surgery

## 2021-06-02 ENCOUNTER — Other Ambulatory Visit: Payer: Self-pay

## 2021-06-02 ENCOUNTER — Ambulatory Visit: Payer: Medicare Other | Admitting: Vascular Surgery

## 2021-06-02 ENCOUNTER — Ambulatory Visit (INDEPENDENT_AMBULATORY_CARE_PROVIDER_SITE_OTHER)
Admission: RE | Admit: 2021-06-02 | Discharge: 2021-06-02 | Disposition: A | Discharge status: Home / Self Care | Payer: Medicare Other | Source: Ambulatory Visit | Attending: Vascular Surgery | Admitting: Vascular Surgery

## 2021-06-02 ENCOUNTER — Ambulatory Visit (HOSPITAL_COMMUNITY)
Admission: RE | Admit: 2021-06-02 | Discharge: 2021-06-02 | Disposition: A | Discharge status: Home / Self Care | Payer: Medicare Other | Source: Ambulatory Visit | Attending: Vascular Surgery | Admitting: Vascular Surgery

## 2021-06-02 VITALS — BP 128/59 | HR 71 | Temp 98.1°F | Resp 20 | Ht 71.0 in | Wt 296.0 lb

## 2021-06-02 DIAGNOSIS — N183 Chronic kidney disease, stage 3 unspecified: Secondary | ICD-10-CM

## 2021-06-02 DIAGNOSIS — N184 Chronic kidney disease, stage 4 (severe): Secondary | ICD-10-CM | POA: Diagnosis not present

## 2021-06-02 NOTE — Progress Notes (Addendum)
I called Mr. Senn several times today, I did not reach him.  I called patient's, Tregan Read son, a designate party release, to speak to.  Gwenlyn Fudge was able to go over [facility] history.  I told Gwenlyn Fudge that I would call his father and leave instructions.   I called Mr.Tavian Carlis Abbott, he answered. I reviewed history with patient and reviewed medication list that I received from [facility].  I instructed to hold Glipizide Monday evening and Tuesday am, take 1/2 of Levemir Insulin at bedtime, if CBG is greater than 70, Tuesday AM take 17 units of Levemir. On Tuesday morning if CBG is greater that 220 take 5 units of Humolog Insulin. I instructed patient to check CBG after awaking and every 2 hours until arrival  to the hospital.  I Instructed patient if CBG is less than 70 to take 4 Glucose Tablets or 1 tube of Glucose Gel or 1/2 cup of a clear juice. Recheck CBG in 15 minutes if CBG is not over 70 call, pre- op desk at [phone] for further instructions. If scheduled to receive Insulin, do not take Insulin.  I had Mr. Yost repeat the instructions back to me.  I instructed patient to shower with antibiotic soap, if it is available.  Dry off with a clean towel. Do not put lotion, powder, cologne or deodorant or makeup.No jewelry or piercings. Men may shave their face and neck. Woman should not shave. No nail polish, artificial or acrylic nails. Wear clean clothes, brush your teeth. Glasses, contact lens,dentures or partials may not be worn in the OR. If you need to wear them, please bring a case for glasses, do not wear contacts or bring a case, the hospital does not have contact cases, dentures or partials will have to be removed , make sure they are clean, we will provide a denture cup to put them in. You will need some one to drive you home and a responsible person over the age of 41 to stay with you for the first 24 hours after surgery.   PCP is Fred A. Tamala Julian, DNP at [facility],   requested records.

## 2021-06-05 NOTE — Anesthesia Preprocedure Evaluation (Addendum)
Anesthesia Evaluation  Patient identified by MRN, date of birth, ID band Patient awake    Reviewed: Allergy & Precautions, NPO status , Patient's Chart, lab work & pertinent test results, reviewed documented beta blocker date and time   History of Anesthesia Complications Negative for: history of anesthetic complications  Airway Mallampati: II  TM Distance: >3 FB Neck ROM: Full    Dental  (+) Dental Advisory Given   Pulmonary neg pulmonary ROS,    breath sounds clear to auscultation       Cardiovascular hypertension, Pt. on medications and Pt. on home beta blockers (-) angina+CHF   Rhythm:Regular Rate:Normal  '20 ECHO: EF 55-60%, normal LVF, no significant valvular abnormalities   Neuro/Psych negative neurological ROS     GI/Hepatic Neg liver ROS, GERD  Controlled,  Endo/Other  diabetes (glu 110), Oral Hypoglycemic Agents, Insulin DependentMorbid obesity  Renal/GU ESRFRenal disease (K+ 4.2)     Musculoskeletal   Abdominal (+) + obese,   Peds  Hematology negative hematology ROS (+)   Anesthesia Other Findings   Reproductive/Obstetrics                            Anesthesia Physical Anesthesia Plan  ASA: 3  Anesthesia Plan: Regional and MAC   Post-op Pain Management: Minimal or no pain anticipated and Tylenol PO (pre-op)   Induction:   PONV Risk Score and Plan: 1  Airway Management Planned: Natural Airway and Simple Face Mask  Additional Equipment: None  Intra-op Plan:   Post-operative Plan:   Informed Consent: I have reviewed the patients History and Physical, chart, labs and discussed the procedure including the risks, benefits and alternatives for the proposed anesthesia with the patient or authorized representative who has indicated his/her understanding and acceptance.     Dental advisory given  Plan Discussed with: CRNA and Surgeon  Anesthesia Plan Comments: (Plan  routine monitors, supraclav block with MAC)       Anesthesia Quick Evaluation

## 2021-06-06 ENCOUNTER — Ambulatory Visit (HOSPITAL_COMMUNITY): Payer: Medicare Other | Admitting: Anesthesiology

## 2021-06-06 ENCOUNTER — Ambulatory Visit (HOSPITAL_COMMUNITY)
Admission: RE | Admit: 2021-06-06 | Discharge: 2021-06-06 | Disposition: A | Discharge status: Home / Self Care | Payer: Medicare Other | Attending: Vascular Surgery | Admitting: Vascular Surgery

## 2021-06-06 ENCOUNTER — Other Ambulatory Visit: Payer: Self-pay | Admitting: Physician Assistant

## 2021-06-06 ENCOUNTER — Encounter (HOSPITAL_COMMUNITY)
Admission: RE | Disposition: A | Discharge status: Home / Self Care | Payer: Self-pay | Source: Home / Self Care | Attending: Vascular Surgery

## 2021-06-06 ENCOUNTER — Other Ambulatory Visit: Payer: Self-pay

## 2021-06-06 ENCOUNTER — Encounter (HOSPITAL_COMMUNITY): Payer: Self-pay | Admitting: Vascular Surgery

## 2021-06-06 DIAGNOSIS — Z79899 Other long term (current) drug therapy: Secondary | ICD-10-CM | POA: Insufficient documentation

## 2021-06-06 DIAGNOSIS — Z7982 Long term (current) use of aspirin: Secondary | ICD-10-CM | POA: Insufficient documentation

## 2021-06-06 DIAGNOSIS — Z7984 Long term (current) use of oral hypoglycemic drugs: Secondary | ICD-10-CM | POA: Diagnosis not present

## 2021-06-06 DIAGNOSIS — N184 Chronic kidney disease, stage 4 (severe): Secondary | ICD-10-CM | POA: Insufficient documentation

## 2021-06-06 DIAGNOSIS — N189 Chronic kidney disease, unspecified: Secondary | ICD-10-CM

## 2021-06-06 DIAGNOSIS — I129 Hypertensive chronic kidney disease with stage 1 through stage 4 chronic kidney disease, or unspecified chronic kidney disease: Secondary | ICD-10-CM | POA: Diagnosis not present

## 2021-06-06 DIAGNOSIS — Z6837 Body mass index (BMI) 37.0-37.9, adult: Secondary | ICD-10-CM | POA: Insufficient documentation

## 2021-06-06 DIAGNOSIS — I509 Heart failure, unspecified: Secondary | ICD-10-CM | POA: Insufficient documentation

## 2021-06-06 DIAGNOSIS — E11319 Type 2 diabetes mellitus with unspecified diabetic retinopathy without macular edema: Secondary | ICD-10-CM | POA: Insufficient documentation

## 2021-06-06 DIAGNOSIS — E1122 Type 2 diabetes mellitus with diabetic chronic kidney disease: Secondary | ICD-10-CM | POA: Diagnosis not present

## 2021-06-06 DIAGNOSIS — N1832 Chronic kidney disease, stage 3b: Secondary | ICD-10-CM

## 2021-06-06 DIAGNOSIS — N186 End stage renal disease: Secondary | ICD-10-CM | POA: Diagnosis present

## 2021-06-06 HISTORY — DX: Heart failure, unspecified: I50.9

## 2021-06-06 HISTORY — DX: Unspecified osteoarthritis, unspecified site: M19.90

## 2021-06-06 HISTORY — PX: AV FISTULA PLACEMENT: SHX1204

## 2021-06-06 LAB — POCT I-STAT, CHEM 8
BUN: >130 mg/dL — ABNORMAL HIGH (ref 8–23)
Calcium, Ion: 1.19 mmol/L (ref 1.15–1.40)
Chloride: 108 mmol/L (ref 98–111)
Creatinine, Ser: 4.2 mg/dL — ABNORMAL HIGH (ref 0.61–1.24)
Glucose, Bld: 90 mg/dL (ref 70–99)
HCT: 36 % — ABNORMAL LOW (ref 39.0–52.0)
Hemoglobin: 12.2 g/dL — ABNORMAL LOW (ref 13.0–17.0)
Potassium: 4.2 mmol/L (ref 3.5–5.1)
Sodium: 142 mmol/L (ref 135–145)
TCO2: 22 mmol/L (ref 22–32)

## 2021-06-06 LAB — GLUCOSE, CAPILLARY
Glucose-Capillary: 110 mg/dL — ABNORMAL HIGH (ref 70–99)
Glucose-Capillary: 70 mg/dL (ref 70–99)
Glucose-Capillary: 88 mg/dL (ref 70–99)

## 2021-06-06 SURGERY — ARTERIOVENOUS (AV) FISTULA CREATION
Anesthesia: Monitor Anesthesia Care | Laterality: Left

## 2021-06-06 MED ORDER — CHLORHEXIDINE GLUCONATE 4 % EX LIQD: 60.0000 mL | Freq: Once | CUTANEOUS | Status: DC

## 2021-06-06 MED ORDER — ACETAMINOPHEN 500 MG PO TABS
1000.0000 mg | ORAL_TABLET | Freq: Once | ORAL | Status: AC
  Administered 2021-06-06: 1000 mg via ORAL
  Filled 2021-06-06: qty 2

## 2021-06-06 MED ORDER — HEMOSTATIC AGENTS (NO CHARGE) OPTIME
TOPICAL | Status: DC | PRN
  Administered 2021-06-06: 1 via TOPICAL

## 2021-06-06 MED ORDER — HYDROCODONE-ACETAMINOPHEN 5-325 MG PO TABS: 1.0000 | ORAL_TABLET | Freq: Four times a day (QID) | ORAL | 0 refills | Status: DC | PRN

## 2021-06-06 MED ORDER — GLYCOPYRROLATE PF 0.2 MG/ML IJ SOSY
PREFILLED_SYRINGE | INTRAMUSCULAR | Status: AC
  Filled 2021-06-06: qty 2

## 2021-06-06 MED ORDER — SODIUM CHLORIDE 0.9 % IV SOLN: INTRAVENOUS | Status: DC

## 2021-06-06 MED ORDER — PHENYLEPHRINE HCL-NACL 20-0.9 MG/250ML-% IV SOLN
INTRAVENOUS | Status: DC | PRN
Start: 2021-06-06 — End: 2021-06-06
  Administered 2021-06-06: 25 ug/min via INTRAVENOUS

## 2021-06-06 MED ORDER — CHLORHEXIDINE GLUCONATE 0.12 % MT SOLN
OROMUCOSAL | Status: AC
  Administered 2021-06-06: 15 mL
  Filled 2021-06-06: qty 15

## 2021-06-06 MED ORDER — OXYCODONE-ACETAMINOPHEN 5-325 MG PO TABS: 1.0000 | ORAL_TABLET | Freq: Four times a day (QID) | ORAL | 0 refills | Status: DC | PRN

## 2021-06-06 MED ORDER — LIDOCAINE-EPINEPHRINE (PF) 1.5 %-1:200000 IJ SOLN
INTRAMUSCULAR | Status: DC | PRN
  Administered 2021-06-06: 30 mL via PERINEURAL

## 2021-06-06 MED ORDER — GLYCOPYRROLATE 0.2 MG/ML IJ SOLN
INTRAMUSCULAR | Status: DC | PRN
  Administered 2021-06-06: .2 mg via INTRAVENOUS

## 2021-06-06 MED ORDER — PROPOFOL 500 MG/50ML IV EMUL
INTRAVENOUS | Status: DC | PRN
  Administered 2021-06-06: 100 ug/kg/min via INTRAVENOUS
  Administered 2021-06-06: 200 ug/kg/min via INTRAVENOUS

## 2021-06-06 MED ORDER — SODIUM CHLORIDE 0.9 % IV SOLN: INTRAVENOUS | Status: DC | PRN

## 2021-06-06 MED ORDER — LIDOCAINE HCL (CARDIAC) PF 100 MG/5ML IV SOSY
PREFILLED_SYRINGE | INTRAVENOUS | Status: DC | PRN
  Administered 2021-06-06: 100 mg via INTRATRACHEAL

## 2021-06-06 MED ORDER — MIDAZOLAM HCL 2 MG/2ML IJ SOLN
INTRAMUSCULAR | Status: DC | PRN
  Administered 2021-06-06: 2 mg via INTRAVENOUS

## 2021-06-06 MED ORDER — HEPARIN SODIUM (PORCINE) 1000 UNIT/ML IJ SOLN
INTRAMUSCULAR | Status: DC | PRN
  Administered 2021-06-06: 2000 [IU] via INTRAVENOUS

## 2021-06-06 MED ORDER — 0.9 % SODIUM CHLORIDE (POUR BTL) OPTIME
TOPICAL | Status: DC | PRN
  Administered 2021-06-06: 1000 mL

## 2021-06-06 MED ORDER — MIDAZOLAM HCL 2 MG/2ML IJ SOLN
INTRAMUSCULAR | Status: AC
  Filled 2021-06-06: qty 2

## 2021-06-06 MED ORDER — PROPOFOL 10 MG/ML IV BOLUS
INTRAVENOUS | Status: AC
  Filled 2021-06-06: qty 20

## 2021-06-06 MED ORDER — CEFAZOLIN IN SODIUM CHLORIDE 3-0.9 GM/100ML-% IV SOLN
3.0000 g | INTRAVENOUS | Status: AC
  Administered 2021-06-06: 3 g via INTRAVENOUS
  Filled 2021-06-06: qty 100

## 2021-06-06 MED ORDER — HEPARIN 6000 UNIT IRRIGATION SOLUTION
Status: DC | PRN
  Administered 2021-06-06: 1

## 2021-06-06 SURGICAL SUPPLY — 40 items
ADH SKN CLS APL DERMABOND .7 (GAUZE/BANDAGES/DRESSINGS) ×1
ARMBAND PINK RESTRICT EXTREMIT (MISCELLANEOUS) ×3 IMPLANT
BAG COUNTER SPONGE SURGICOUNT (BAG) ×3 IMPLANT
BAG SPNG CNTER NS LX DISP (BAG) ×1
BLADE CLIPPER SURG (BLADE) ×3 IMPLANT
BNDG ELASTIC 4X5.8 VLCR STR LF (GAUZE/BANDAGES/DRESSINGS) ×1 IMPLANT
CANISTER SUCT 3000ML PPV (MISCELLANEOUS) ×3 IMPLANT
CLIP LIGATING EXTRA MED SLVR (CLIP) ×2 IMPLANT
CLIP LIGATING EXTRA SM BLUE (MISCELLANEOUS) ×2 IMPLANT
CLIP VESOCCLUDE MED 6/CT (CLIP) ×2 IMPLANT
COVER PROBE W GEL 5X96 (DRAPES) ×2 IMPLANT
DECANTER SPIKE VIAL GLASS SM (MISCELLANEOUS) ×2 IMPLANT
DERMABOND ADVANCED (GAUZE/BANDAGES/DRESSINGS) ×1
DERMABOND ADVANCED .7 DNX12 (GAUZE/BANDAGES/DRESSINGS) ×1 IMPLANT
DRAPE HALF SHEET 40X57 (DRAPES) ×1 IMPLANT
ELECT REM PT RETURN 9FT ADLT (ELECTROSURGICAL) ×2
ELECTRODE REM PT RTRN 9FT ADLT (ELECTROSURGICAL) ×1 IMPLANT
GLOVE SRG 8 PF TXTR STRL LF DI (GLOVE) ×2 IMPLANT
GLOVE SURG POLYISO LF SZ8 (GLOVE) IMPLANT
GLOVE SURG UNDER POLY LF SZ8 (GLOVE) ×4
GOWN STRL REUS W/ TWL LRG LVL3 (GOWN DISPOSABLE) ×2 IMPLANT
GOWN STRL REUS W/TWL 2XL LVL3 (GOWN DISPOSABLE) ×2 IMPLANT
GOWN STRL REUS W/TWL LRG LVL3 (GOWN DISPOSABLE) ×4
HEMOSTAT SNOW SURGICEL 2X4 (HEMOSTASIS) ×1 IMPLANT
KIT BASIN OR (CUSTOM PROCEDURE TRAY) ×2 IMPLANT
KIT TURNOVER KIT B (KITS) ×2 IMPLANT
LOOP VESSEL MINI RED (MISCELLANEOUS) ×1 IMPLANT
NS IRRIG 1000ML POUR BTL (IV SOLUTION) ×3 IMPLANT
PACK CV ACCESS (CUSTOM PROCEDURE TRAY) ×3 IMPLANT
PAD ARMBOARD 7.5X6 YLW CONV (MISCELLANEOUS) ×4 IMPLANT
SUT MNCRL AB 4-0 PS2 18 (SUTURE) ×2 IMPLANT
SUT PROLENE 6 0 BV (SUTURE) ×4 IMPLANT
SUT PROLENE 7 0 BV 1 (SUTURE) IMPLANT
SUT SILK 2 0 SH (SUTURE) ×2 IMPLANT
SUT VIC AB 3-0 SH 27 (SUTURE) ×2
SUT VIC AB 3-0 SH 27X BRD (SUTURE) ×2 IMPLANT
SYR BULB IRRIG 60ML STRL (SYRINGE) ×1 IMPLANT
TOWEL GREEN STERILE (TOWEL DISPOSABLE) ×3 IMPLANT
UNDERPAD 30X36 HEAVY ABSORB (UNDERPADS AND DIAPERS) ×2 IMPLANT
WATER STERILE IRR 1000ML POUR (IV SOLUTION) ×2 IMPLANT

## 2021-06-06 NOTE — Anesthesia Postprocedure Evaluation (Signed)
Anesthesia Post Note  Patient: [name]  Procedure(s) Performed: LEFT BRACHIOCEPHALIC ARTERIOVENOUS (AV) FISTULA CREATION (Left)     Anesthesia Post Evaluation No notable events documented.  Last Vitals:  Vitals:   06/06/21 0856 06/06/21 0916  BP: 116/82 (!) 122/95  Pulse: 64   Resp: 15 12  Temp:  36.6 C  SpO2: 98%     Last Pain:  Vitals:   06/06/21 0630  TempSrc:   PainSc: 0-No pain                 Parlee Amescua,E. Maryrose Colvin

## 2021-06-06 NOTE — Op Note (Signed)
° ° °  NAME: [name]    MRN: [id number] DOB: [birth year]    DATE OF OPERATION: 06/06/2021  PREOP DIAGNOSIS:    Chronic kidney disease, stage IV  POSTOP DIAGNOSIS:    Same  PROCEDURE:    Left-sided brachiocephalic fistula  SURGEON: Broadus John  ASSIST: Leontine Locket, PA  ANESTHESIA: Moderate sedation, block  EBL: 5 mL  INDICATIONS:    [name] is a 66 y.o. male who presents with chronic kidney disease stage 4. Based on vein mapping and examination, patient would be best suited for left-sided brachiocephalic fistula, as he is right-handed. I had an extensive discussion with this patient in regards to the nature of access surgery, including risk, benefits, and alternatives.  After these discussions, Tyquez elected to proceed. FINDINGS:   5 mm cephalic vein 4 mm brachial artery  TECHNIQUE:   A left upper extremity block was performed by the anesthesia team. The patient was brought to the operating room and placed in supine position. The left arm was prepped and draped in standard fashion. IV antibiotics were administered. A timeout was performed.   The case began with ultrasound insonation of the left brachial artery and cephalic vein, which demonstrated sufficient size at the antecubital fossa for arteriovenous fistula.   A transverse incision was made below the elbow creese in the antecubital fossa. The cephalic vein was isolated for 3 cm in length. Next the aponeurosis was partially released and the brachial artery secured with a vessel loop. The patient was heparinized. The cephalic vein was transected and ligated distally with a 2-0 silk stick-tie. The vein was dilated with coronary dilators and flushed with heparin saline. Vascular clamps were placed proximally and distally on the brachial artery and a 5 mm arteriotomy was created on the brachial artery. This was flushed with heparin saline.  An anastomosis was created in end to side fashion on the brachial artery  using running 6-0 Prolene suture.  Prior to completing the anastomosis, the vessels were flushed and the suture line was tied down. There was an excellent thrill in the cephalic vein from the anastomosis to the mid upper bicipital region. The patient had a multiphasic radial and ulnar signal. He had an excellent doppler signal in the fistula. The incision was irrigated and hemostasis achieved with cautery and suture. The deeper tissue was closed with 3-0 Vicryl and the skin closed with 4-0 Monocryl.  Dermabond was applied to the incisions. The patient was transferred to PACU in stable condition.  Given the complexity of the case a first assistant was necessary in order to expedient the procedure and safely perform the technical aspects of the operation.  Macie Burows, MD [facility]  DATE OF DICTATION:   06/06/2021

## 2021-06-06 NOTE — Progress Notes (Signed)
Orthopedic Tech Progress Note Patient Details:  [name] 07-13-1955 [id number]  PACU RN called requesting an ARM SLING for patient   Ortho Devices Type of Ortho Device: Arm sling Ortho Device/Splint Location: LUE Ortho Device/Splint Interventions: Ordered   Post Interventions Patient Tolerated: Well Instructions Provided: [facility] [place] 06/06/2021, 9:32 AM

## 2021-06-06 NOTE — H&P (Signed)
Office Note   Patient seen and examined in preop holding.  No complaints. No changes to medication history or physical exam since last seen in clinic. After discussing the risks and benefits of left arm AV fistula creation, [name] elected to proceed.   Gary John MD   CC:  CKD4 Requesting Provider:  No ref. provider found  HPI: [name] is a Right handed 66 y.o. (1955-10-21) male with kidney disease who presents at the request of No ref. provider found for permanent HD access. The patient has had no prior access procedures. No tunneled lines. Currently is CKD, not on dialysis.   On exam, [name] was doing well with no complaints.  He had a wonderful Christmas with his family noting how much he loved to be there for his granddaughters.  The pt is  on a statin for cholesterol management.  The pt is  on a daily aspirin.   Other AC:  - The pt is  on medications for hypertension.   The pt is  diabetic. Tobacco hx:  none  Past Medical History:  Diagnosis Date   Arthritis    Cardiomyopathy    Resolved with EF 55% 2011   CARDIOMYOPATHY 04/22/2006   CARDIOMYOPATHY 04/22/2006   2- D Echo (04/2010) : The EF is probably 55% with some hypokinesis at the base of the inferior wall. Wall thickness was increased in a pattern of mild LVH. Patient followed by Dr Martinique (Cardiology)      CHF (congestive heart failure) ([place])    Chronic kidney disease    Diabetes mellitus    DIABETIC  RETINOPATHY 02/06/2007   GERD (gastroesophageal reflux disease)    Hyperlipemia    Hypertension    Iron deficiency 04/09/2018   NEPHROPATHY, DIABETIC 06/08/2006   Paroxysmal atrial tachycardia [facility])     Past Surgical History:  Procedure Laterality Date   CARDIAC CATHETERIZATION  Dec 2007   normal; EF- 55-60%    Social History   Socioeconomic History   Marital status: Single    Spouse name: Not on file   Number of children: Not on file   Years of education: Not on file   Highest  education level: Not on file  Occupational History   Not on file  Tobacco Use   Smoking status: Never   Smokeless tobacco: Never  Vaping Use   Vaping Use: Never used  Substance and Sexual Activity   Alcohol use: No    Alcohol/week: 0.0 standard drinks   Drug use: No   Sexual activity: Not on file  Other Topics Concern   Not on file  Social History Narrative   Not on file   Social Determinants of Health   Financial Resource Strain: Not on file  Food Insecurity: Not on file  Transportation Needs: Not on file  Physical Activity: Not on file  Stress: Not on file  Social Connections: Not on file  Intimate Partner Violence: Not on file    Family History  Problem Relation Age of Onset   Diabetes Mother    Diabetes Brother     Current Facility-Administered Medications  Medication Dose Route Frequency Provider Last Rate Last Admin   0.9 %  sodium chloride infusion   Intravenous Continuous Gary John, MD       ceFAZolin (ANCEF) IVPB 3g/100 mL premix  3 g Intravenous 30 min Pre-Op Gary John, MD       chlorhexidine (HIBICLENS) 4 % liquid 4 application  60 mL Topical Once Gary John, MD       And   [START ON 06/07/2021] chlorhexidine (HIBICLENS) 4 % liquid 4 application  60 mL Topical Once Gary John, MD        No Known Allergies   REVIEW OF SYSTEMS:   [X]  denotes positive finding, [ ]  denotes negative finding Cardiac  Comments:  Chest pain or chest pressure:    Shortness of breath upon exertion:    Short of breath when lying flat:    Irregular heart rhythm:        Vascular    Pain in calf, thigh, or hip brought on by ambulation:    Pain in feet at night that wakes you up from your sleep:     Blood clot in your veins:    Leg swelling:         Pulmonary    Oxygen at home:    Productive cough:     Wheezing:         Neurologic    Sudden weakness in arms or legs:     Sudden numbness in arms or legs:     Sudden onset of difficulty speaking or  slurred speech:    Temporary loss of vision in one eye:     Problems with dizziness:         Gastrointestinal    Blood in stool:     Vomited blood:         Genitourinary    Burning when urinating:     Blood in urine:        Psychiatric    Major depression:         Hematologic    Bleeding problems:    Problems with blood clotting too easily:        Skin    Rashes or ulcers:        Constitutional    Fever or chills:      PHYSICAL EXAMINATION:  Vitals:   06/06/21 0614  BP: (!) 137/58  Pulse: 61  Resp: 19  Temp: 98.3 F (36.8 C)  TempSrc: Oral  SpO2: 99%  Weight: 124.7 kg  Height: 6' (1.829 m)    General:  WDWN in NAD; vital signs documented above Gait: Not observed HENT: WNL, normocephalic Pulmonary: normal non-labored breathing , without Rales, rhonchi,  wheezing Cardiac: regular HR,  Abdomen: soft, NT, no masses Skin: without rashes Vascular Exam/Pulses:  Right Left  Radial 2+ (normal) 2+ (normal)  Ulnar 2+ (normal) 2+ (normal)                   Extremities: without ischemic changes, without Gangrene , without cellulitis; without open wounds;  Musculoskeletal: no muscle wasting or atrophy  Neurologic: A&O X 3;  No focal weakness or paresthesias are detected Psychiatric:  The pt has Normal affect.   Non-Invasive Vascular Imaging:   No arterial obstruction identified in bilateral upper extremities Bilateral upper extremity vein mapping demonstrated adequately sized cephalic and basilic veins bilaterally.   ASSESSMENT/PLAN:  [name] is a 66 y.o. male who presents with chronic kidney disease stage 4  Based on vein mapping and examination, patient would be best suited for left-sided brachiocephalic fistula, as he is right-handed.. I had an extensive discussion with this patient in regards to the nature of access surgery, including risk, benefits, and alternatives.   The patient is aware that the risks of access surgery include but are not  limited to:  bleeding, infection, steal syndrome, nerve damage, ischemic monomelic neuropathy, failure of access to mature, complications related to venous hypertension, and possible need for additional access procedures in the future. I discussed with the patient the nature of the staged access procedure, specifically the need for a second operation to transpose the first stage fistula if it matures adequately.   The patient has agreed to proceed with the above procedure which will be scheduled.  In chart checking the patient further, it was noted that he has a history of nonpalpable pulses in the feet.  No current wounds, but he would benefit from ABI at a later date.  I will schedule this when he has his fistula checked in roughly 1 month.  Gary John, MD [facility] [phone]

## 2021-06-06 NOTE — Transfer of Care (Signed)
Immediate Anesthesia Transfer of Care Note  Patient: [name]  Procedure(s) Performed: LEFT BRACHIOCEPHALIC ARTERIOVENOUS (AV) FISTULA CREATION (Left)  Patient Location: PACU  Anesthesia Type:MAC combined with regional for post-op pain  Level of Consciousness: awake, alert , oriented and patient cooperative  Airway & Oxygen Therapy: Patient Spontanous Breathing  Post-op Assessment: Report given to RN, Post -op Vital signs reviewed and stable and Patient moving all extremities X 4  Post vital signs: Reviewed and stable  Last Vitals:  Vitals Value Taken Time  BP    Temp    Pulse    Resp    SpO2      Last Pain:  Vitals:   06/06/21 0630  TempSrc:   PainSc: 0-No pain         Complications: No notable events documented.

## 2021-06-06 NOTE — Discharge Instructions (Signed)
° °  [facility]  Discharge Instructions  AV Fistula or Graft Surgery for Dialysis Access  Please refer to the following instructions for your post-procedure care. Your surgeon or physician assistant will discuss any changes with you.  Activity  You may drive the day following your surgery, if you are comfortable and no longer taking prescription pain medication. Resume full activity as the soreness in your incision resolves.  Bathing/Showering  You may shower after you go home. Keep your incision dry for 48 hours. Do not soak in a bathtub, hot tub, or swim until the incision heals completely. You may not shower if you have a hemodialysis catheter.  Incision Care  Clean your incision with mild soap and water after 48 hours. Pat the area dry with a clean towel. You do not need a bandage unless otherwise instructed. Do not apply any ointments or creams to your incision. You may have skin glue on your incision. Do not peel it off. It will come off on its own in about one week. Your arm may swell a bit after surgery. To reduce swelling use pillows to elevate your arm so it is above your heart. Your doctor will tell you if you need to lightly wrap your arm with an ACE bandage.  Diet  Resume your normal diet. There are not special food restrictions following this procedure. In order to heal from your surgery, it is CRITICAL to get adequate nutrition. Your body requires vitamins, minerals, and protein. Vegetables are the best source of vitamins and minerals. Vegetables also provide the perfect balance of protein. Processed food has little nutritional value, so try to avoid this.  Medications  Resume taking all of your medications. If your incision is causing pain, you may take over-the counter pain relievers such as acetaminophen (Tylenol). If you were prescribed a stronger pain medication, please be aware these medications can cause nausea and constipation. Prevent  nausea by taking the medication with a snack or meal. Avoid constipation by drinking plenty of fluids and eating foods with high amount of fiber, such as fruits, vegetables, and grains.  Do not take Tylenol if you are taking prescription pain medications.  Follow up Your surgeon may want to see you in the office following your access surgery. If so, this will be arranged at the time of your surgery.  Please call us immediately for any of the following conditions:  Increased pain, redness, drainage (pus) from your incision site Fever of 101 degrees or higher Severe or worsening pain at your incision site Hand pain or numbness.  Reduce your risk of vascular disease:  Stop smoking. If you would like help, call [facility] at 1-800-QUIT-NOW 548-619-0783) or [place] at [place] your cholesterol Maintain a desired weight Control your diabetes Keep your blood pressure down  Dialysis  It will take several weeks to several months for your new dialysis access to be ready for use. Your surgeon will determine when it is okay to use it. Your nephrologist will continue to direct your dialysis. You can continue to use your Permcath until your new access is ready for use.   06/06/2021 [name] [id number] 11-12-1955  Surgeon(s): Broadus John, MD  Procedure(s): LEFT BRACHIOCEPHALIC ARTERIOVENOUS (AV) FISTULA CREATION  x Do not stick fistula for 12 weeks    If you have any questions, please call the office at [phone].

## 2021-06-06 NOTE — Anesthesia Procedure Notes (Signed)
Anesthesia Regional Block: Supraclavicular block   Pre-Anesthetic Checklist: , timeout performed,  Correct Patient, Correct Site, Correct Laterality,  Correct Procedure, Correct Position, site marked,  Risks and benefits discussed,  Surgical consent,  Pre-op evaluation,  At surgeon's request and post-op pain management  Laterality: Left and Upper  Prep: chloraprep       Needles:  Injection technique: Single-shot  Needle Type: Echogenic Needle     Needle Length: 9cm  Needle Gauge: 21     Additional Needles:   Procedures:,,,, ultrasound used (permanent image in chart),,    Narrative:  Start time: 06/06/2021 6:52 AM End time: 06/06/2021 6:59 AM Injection made incrementally with aspirations every 5 mL.  Performed by: Personally  Anesthesiologist: Annye Asa, MD  Additional Notes: Pt identified in Holding room.  Monitors applied. Working IV access confirmed. Sterile prep L clavicle and neck.  #21ga ECHOgenic Arrow block needle to supraclav brachial plexus with US guidance.  30cc 1.5% lidocaine with 1:200k epi injected incrementally after negative test dose.  Patient asymptomatic, VSS, no heme aspirated, tolerated well.   Jenita Seashore, MD

## 2021-06-07 ENCOUNTER — Encounter (HOSPITAL_COMMUNITY): Payer: Self-pay | Admitting: Vascular Surgery

## 2021-07-16 ENCOUNTER — Other Ambulatory Visit: Payer: Self-pay

## 2021-07-16 DIAGNOSIS — N184 Chronic kidney disease, stage 4 (severe): Secondary | ICD-10-CM

## 2021-07-21 ENCOUNTER — Ambulatory Visit (INDEPENDENT_AMBULATORY_CARE_PROVIDER_SITE_OTHER): Payer: Medicare Other | Admitting: Physician Assistant

## 2021-07-21 ENCOUNTER — Other Ambulatory Visit: Payer: Self-pay

## 2021-07-21 ENCOUNTER — Ambulatory Visit (HOSPITAL_COMMUNITY)
Admission: RE | Admit: 2021-07-21 | Discharge: 2021-07-21 | Disposition: A | Discharge status: Home / Self Care | Payer: Medicare Other | Source: Ambulatory Visit | Attending: Vascular Surgery | Admitting: Vascular Surgery

## 2021-07-21 VITALS — BP 138/71 | HR 65 | Temp 98.0°F | Resp 20 | Ht 72.0 in | Wt 263.0 lb

## 2021-07-21 DIAGNOSIS — N184 Chronic kidney disease, stage 4 (severe): Secondary | ICD-10-CM | POA: Diagnosis not present

## 2021-07-21 NOTE — Progress Notes (Signed)
POST OPERATIVE OFFICE NOTE    CC:  F/u for surgery  HPI:  This is a 66 y.o. male who has CKD stage IV and  is s/p left BC AV fistula creation on 06/06/21 by Dr. Virl Cagey.  He is currently not on HD.  He is followed by Dr. Moshe Cipro.  He states he has another f/u in 3 months.  He was just seen by her a week ago and he states his CKD is stable for now.     Pt returns today for follow up.  Pt states he has no pain, loss of motor or loss of sensation in the left UE.    No Known Allergies  Current Outpatient Medications  Medication Sig Dispense Refill   acetaminophen (TYLENOL) 500 MG tablet Take 500-1,000 mg by mouth every 6 (six) hours as needed for moderate pain.     aspirin 81 MG tablet Take 1 tablet (81 mg total) by mouth daily. 90 tablet 0   atorvastatin (LIPITOR) 80 MG tablet Take 1 tablet (80 mg total) by mouth daily. 90 tablet 3   BAYER MICROLET LANCETS lancets Check blood sugar 3 times a day as instructed 100 each 12   Blood Glucose Monitoring Suppl (ONE TOUCH ULTRA MINI) w/Device KIT 1 strip by Other route 3 (three) times daily. 1 each 11   calcitRIOL (ROCALTROL) 0.25 MCG capsule Take 0.25 mcg by mouth daily.     carvedilol (COREG) 25 MG tablet TAKE 1 TABLET BY MOUTH TWICE DAILY WITH A MEAL 180 tablet 0   Cholecalciferol 25 MCG (1000 UT) capsule Take 2 capsules (2,000 Units total) by mouth daily. 90 capsule 1   diltiazem (CARDIZEM SR) 60 MG 12 hr capsule Take 1 capsule (60 mg total) by mouth 2 (two) times daily. 180 capsule 0   furosemide (LASIX) 80 MG tablet Take 80 mg by mouth 2 (two) times daily.     glipiZIDE (GLUCOTROL) 10 MG tablet Take 10 mg by mouth 2 (two) times daily.     glucose blood (ONE TOUCH ULTRA TEST) test strip The patient is insulin requiring, ICD 10 code E11.65. The patient tests 3 times per day. 100 each 12   hydrALAZINE (APRESOLINE) 50 MG tablet Take 1.5 tablets (75 mg total) by mouth 3 (three) times daily. (Patient taking differently: Take 50 mg by mouth in the  morning and at bedtime.) 270 tablet 1   HYDROcodone-acetaminophen (NORCO) 5-325 MG tablet Take 1 tablet by mouth every 6 (six) hours as needed for moderate pain. 8 tablet 0   insulin lispro (HUMALOG) 100 UNIT/ML KwikPen Inject 10 Units into the skin 3 (three) times daily.     Insulin Lispro Prot & Lispro (HUMALOG MIX 75/25 KWIKPEN) (75-25) 100 UNIT/ML Kwikpen Inject 63 Units into the skin 2 (two) times daily. 15 mL 11   Insulin Pen Needle 31G X 6 MM MISC 1 Stick by Other route 3 (three) times daily. Use with insulin and liraglutide. 100 each 3   LEVEMIR 100 UNIT/ML injection Inject 35 Units into the skin 2 (two) times daily.     liraglutide (VICTOZA) 18 MG/3ML SOPN Inject 0.3 mLs (1.8 mg total) into the skin daily. 3 pen 11   Omega-3 Fatty Acids (FISH OIL) 1200 MG CAPS Take 2 capsules (2,400 mg total) by mouth daily. 60 capsule 11   sodium bicarbonate 325 MG tablet Take 1 tablet (325 mg total) by mouth 3 (three) times daily. 269 tablet 0   metFORMIN (GLUCOPHAGE) 500 MG tablet Take 1  tablet (500 mg total) by mouth 2 (two) times daily with a meal. (Patient not taking: Reported on 06/05/2021) 180 tablet 3   No current facility-administered medications for this visit.     ROS:  See HPI  Physical Exam:    Incision:  well healed  Extremities:  Palpable thrill in the fistula near the distal anastomosis.. I could not appreciate a thrill as I palpated proximally due to the depth.  Grip 5/5 B equal, sensation intact. Lungs: non labored breathing      Findings:  +--------------------+----------+-----------------+--------+   AVF                  PSV (cm/s) Flow Vol (mL/min) Comments   +--------------------+----------+-----------------+--------+   Native artery inflow    225           1910                   +--------------------+----------+-----------------+--------+   AVF Anastomosis         289                                  +--------------------+----------+-----------------+--------+       +------------+----------+-------------+----------+----------------+   OUTFLOW VEIN PSV (cm/s) Diameter (cm) Depth (cm)     Describe       +------------+----------+-------------+----------+----------------+   Shoulder        138         0.56         2.32                       +------------+----------+-------------+----------+----------------+   Prox UA         155         0.55         1.73                       +------------+----------+-------------+----------+----------------+   Mid UA          124         0.50         0.77                       +------------+----------+-------------+----------+----------------+   Dist UA         180         0.69         0.46    competing branch   +------------+----------+-------------+----------+----------------+   AC Fossa        132         0.79         0.28                       +------------+----------+-------------+----------+----------------+     Summary:  Patent arteriovenous fistula.   Assessment/Plan:  This is a 66 y.o. male who is s/p:left Brachial Cephalic AV fistula  The fistula is maturing well, but is too deep to access with a distal competing branch.  I will schedule him for superficialization of the left BC AV fistula with Dr. Virl Cagey.  He has several MD appointment coming up and has asked that we call him to schedule this procedure.  I explained this can be scheduled as an out patient procedure.      Roxy Horseman PA-C [facility] [phone]   Clinic MD:  Virl Cagey

## 2021-07-31 ENCOUNTER — Encounter: Payer: Self-pay | Admitting: Podiatry

## 2021-07-31 ENCOUNTER — Ambulatory Visit: Payer: Medicare Other | Admitting: Podiatry

## 2021-07-31 ENCOUNTER — Other Ambulatory Visit: Payer: Self-pay

## 2021-07-31 DIAGNOSIS — E1159 Type 2 diabetes mellitus with other circulatory complications: Secondary | ICD-10-CM | POA: Diagnosis not present

## 2021-07-31 DIAGNOSIS — N1832 Chronic kidney disease, stage 3b: Secondary | ICD-10-CM | POA: Diagnosis not present

## 2021-07-31 DIAGNOSIS — M79675 Pain in left toe(s): Secondary | ICD-10-CM | POA: Diagnosis not present

## 2021-07-31 DIAGNOSIS — B351 Tinea unguium: Secondary | ICD-10-CM | POA: Diagnosis not present

## 2021-07-31 DIAGNOSIS — M79674 Pain in right toe(s): Secondary | ICD-10-CM

## 2021-07-31 NOTE — Progress Notes (Signed)
This patient returns to my office for at risk foot care.  This patient requires this care by a professional since this patient will be at risk due to having chronic kidney disease and type 2 diabetes.  This patient is unable to cut nails himself since the patient cannot reach his nails.These nails are painful walking and wearing shoes.  This patient presents for at risk foot care today.  General Appearance  Alert, conversant and in no acute stress.  Vascular  Dorsalis pedis and posterior tibial  pulses are not  palpable  bilaterally.  Capillary return is within normal limits  bilaterally. Temperature is within normal limits  bilaterally.  Neurologic  Senn-Weinstein monofilament wire test within normal limits  bilaterally. Muscle power within normal limits bilaterally.  Nails Thick disfigured discolored nails with subungual debris  from hallux to fifth toes bilaterally. No evidence of bacterial infection or drainage bilaterally.  Orthopedic  No limitations of motion  feet .  No crepitus or effusions noted.  No bony pathology or digital deformities noted.  Skin  normotropic skin with no porokeratosis noted bilaterally.  No signs of infections or ulcers noted.     Onychomycosis  Pain in right toes  Pain in left toes  Consent was obtained for treatment procedures.   Mechanical debridement of nails 1-5  bilaterally performed with a nail nipper.  Filed with dremel without incident.    Return office visit   4 months                    Told patient to return for periodic foot care and evaluation due to potential at risk complications.   Gardiner Barefoot DPM

## 2021-10-09 ENCOUNTER — Encounter: Payer: Self-pay | Admitting: Nephrology

## 2021-11-28 ENCOUNTER — Ambulatory Visit: Payer: Medicare Other | Admitting: Podiatry

## 2021-11-28 ENCOUNTER — Encounter: Payer: Self-pay | Admitting: Podiatry

## 2021-11-28 DIAGNOSIS — B351 Tinea unguium: Secondary | ICD-10-CM

## 2021-11-28 DIAGNOSIS — N1832 Chronic kidney disease, stage 3b: Secondary | ICD-10-CM | POA: Diagnosis not present

## 2021-11-28 DIAGNOSIS — E1159 Type 2 diabetes mellitus with other circulatory complications: Secondary | ICD-10-CM | POA: Diagnosis not present

## 2021-11-28 DIAGNOSIS — M79675 Pain in left toe(s): Secondary | ICD-10-CM

## 2021-11-28 DIAGNOSIS — M79674 Pain in right toe(s): Secondary | ICD-10-CM

## 2021-12-22 ENCOUNTER — Telehealth: Payer: Self-pay

## 2021-12-22 ENCOUNTER — Other Ambulatory Visit: Payer: Self-pay

## 2021-12-22 DIAGNOSIS — N184 Chronic kidney disease, stage 4 (severe): Secondary | ICD-10-CM

## 2021-12-22 NOTE — Telephone Encounter (Signed)
Received a call from patient, stating he was ready to schedule left brachiocephalic fistula superficialization surgery. Surgery scheduled for August 8, when patient was available. Instructions provided and he verbalized understanding.

## 2022-01-08 NOTE — Progress Notes (Signed)
PCP - Sonia Side., FNP Cardiologist - pt denies EKG - DOS Chest x-ray -  ECHO - 04/20/19 Cardiac Cath - 05/2006 CPAP -  Fasting Blood Sugar:  70-100 (max 200s) Checks Blood Sugar:  1x/day Blood Thinner Instructions:  Aspirin Instructions: cont ASA DOS  ERAS Protcol - n/a COVID TEST- n/a  Anesthesia review: yes  -------------  SDW INSTRUCTIONS:  Your procedure is scheduled on 8/8. Please report to [facility] Main Entrance "A" at 0530 A.M., and check in at the Admitting office. Call this number if you have problems the morning of surgery: [phone]   Remember: Do not eat or drink after midnight the night before your surgery   Medications to take morning of surgery with a sip of water include: abiraterone acetate (ZYTIGA)  acetaminophen (TYLENOL) If needed aspirin  atorvastatin (LIPITOR)  carvedilol (COREG) hydrALAZINE (APRESOLINE)   ** PLEASE check your blood sugar the morning of your surgery when you wake up and every 2 hours until you get to the Short Stay unit.  If your blood sugar is less than 70 mg/dL, you will need to treat for low blood sugar: Do not take insulin. Treat a low blood sugar (less than 70 mg/dL) with  cup of clear juice (cranberry or apple), 4 glucose tablets, OR glucose gel. Recheck blood sugar in 15 minutes after treatment (to make sure it is greater than 70 mg/dL). If your blood sugar is not greater than 70 mg/dL on recheck, call [phone] for further instructions.  glipiZIDE (GLUCOTROL)  8/7: take usual; no bedtime dose 8/8: none  INSULIN LISPRO 8/7: take usual; no bedtime dose 8/8: none; if CBG >220 take half usual dose  LEVEMIR  8/7: take usual AM dose; take half (20 units) PM dose 8/8: take half (20 units) AM dose  metFORMIN (GLUCOPHAGE)  8/7: take usual 8/8: none   As of today, STOP taking any Aleve, Naproxen, Ibuprofen, Motrin, Advil, Goody's, BC's, all herbal medications, fish oil, and all vitamins.    The Morning  of Surgery Do not wear jewelry Do not wear lotions, powders, colognes, or deodorant Do not bring valuables to the hospital. [facility] is not responsible for any belongings or valuables.  If you are a smoker, DO NOT Smoke 24 hours prior to surgery  If you wear a CPAP at night please bring your mask the morning of surgery   Remember that you must have someone to transport you home after your surgery, and remain with you for 24 hours if you are discharged the same day.  Please bring cases for contacts, glasses, hearing aids, dentures or bridgework because it cannot be worn into surgery.   Patients discharged the day of surgery will not be allowed to drive home.   Please shower the NIGHT BEFORE/MORNING OF SURGERY (use antibacterial soap like DIAL soap if possible). Wear comfortable clothes the morning of surgery. Oral Hygiene is also important to reduce your risk of infection.  Remember - BRUSH YOUR TEETH THE MORNING OF SURGERY WITH YOUR REGULAR TOOTHPASTE  Patient denies shortness of breath, fever, cough and chest pain.

## 2022-01-09 ENCOUNTER — Ambulatory Visit (HOSPITAL_COMMUNITY)
Admission: RE | Admit: 2022-01-09 | Discharge: 2022-01-09 | Disposition: A | Discharge status: Home / Self Care | Payer: Medicare Other | Source: Ambulatory Visit | Attending: Vascular Surgery | Admitting: Vascular Surgery

## 2022-01-09 ENCOUNTER — Ambulatory Visit (HOSPITAL_BASED_OUTPATIENT_CLINIC_OR_DEPARTMENT_OTHER): Payer: Medicare Other | Admitting: Physician Assistant

## 2022-01-09 ENCOUNTER — Encounter (HOSPITAL_COMMUNITY): Payer: Self-pay | Admitting: Vascular Surgery

## 2022-01-09 ENCOUNTER — Other Ambulatory Visit: Payer: Self-pay

## 2022-01-09 ENCOUNTER — Ambulatory Visit (HOSPITAL_COMMUNITY): Payer: Medicare Other | Admitting: Physician Assistant

## 2022-01-09 ENCOUNTER — Encounter (HOSPITAL_COMMUNITY)
Admission: RE | Disposition: A | Discharge status: Home / Self Care | Payer: Self-pay | Source: Ambulatory Visit | Attending: Vascular Surgery

## 2022-01-09 DIAGNOSIS — N185 Chronic kidney disease, stage 5: Secondary | ICD-10-CM | POA: Diagnosis not present

## 2022-01-09 DIAGNOSIS — E1122 Type 2 diabetes mellitus with diabetic chronic kidney disease: Secondary | ICD-10-CM | POA: Insufficient documentation

## 2022-01-09 DIAGNOSIS — I130 Hypertensive heart and chronic kidney disease with heart failure and stage 1 through stage 4 chronic kidney disease, or unspecified chronic kidney disease: Secondary | ICD-10-CM

## 2022-01-09 DIAGNOSIS — Z6835 Body mass index (BMI) 35.0-35.9, adult: Secondary | ICD-10-CM | POA: Insufficient documentation

## 2022-01-09 DIAGNOSIS — I509 Heart failure, unspecified: Secondary | ICD-10-CM

## 2022-01-09 DIAGNOSIS — T82898A Other specified complication of vascular prosthetic devices, implants and grafts, initial encounter: Secondary | ICD-10-CM | POA: Diagnosis not present

## 2022-01-09 DIAGNOSIS — N184 Chronic kidney disease, stage 4 (severe): Secondary | ICD-10-CM | POA: Diagnosis not present

## 2022-01-09 HISTORY — PX: FISTULA SUPERFICIALIZATION: SHX6341

## 2022-01-09 LAB — POCT I-STAT, CHEM 8
BUN: >130 mg/dL — ABNORMAL HIGH (ref 8–23)
BUN: >130 mg/dL — ABNORMAL HIGH (ref 8–23)
Calcium, Ion: 1.11 mmol/L — ABNORMAL LOW (ref 1.15–1.40)
Calcium, Ion: 1.21 mmol/L (ref 1.15–1.40)
Chloride: 103 mmol/L (ref 98–111)
Chloride: 103 mmol/L (ref 98–111)
Creatinine, Ser: 4 mg/dL — ABNORMAL HIGH (ref 0.61–1.24)
Creatinine, Ser: 4.1 mg/dL — ABNORMAL HIGH (ref 0.61–1.24)
Glucose, Bld: 79 mg/dL (ref 70–99)
Glucose, Bld: 80 mg/dL (ref 70–99)
HCT: 32 % — ABNORMAL LOW (ref 39.0–52.0)
HCT: 32 % — ABNORMAL LOW (ref 39.0–52.0)
Hemoglobin: 10.9 g/dL — ABNORMAL LOW (ref 13.0–17.0)
Hemoglobin: 10.9 g/dL — ABNORMAL LOW (ref 13.0–17.0)
Potassium: 3.9 mmol/L (ref 3.5–5.1)
Potassium: 3.9 mmol/L (ref 3.5–5.1)
Sodium: 141 mmol/L (ref 135–145)
Sodium: 141 mmol/L (ref 135–145)
TCO2: 26 mmol/L (ref 22–32)
TCO2: 27 mmol/L (ref 22–32)

## 2022-01-09 LAB — GLUCOSE, CAPILLARY
Glucose-Capillary: 83 mg/dL (ref 70–99)
Glucose-Capillary: 67 mg/dL — ABNORMAL LOW (ref 70–99)
Glucose-Capillary: 82 mg/dL (ref 70–99)

## 2022-01-09 SURGERY — FISTULA SUPERFICIALIZATION
Anesthesia: Monitor Anesthesia Care | Laterality: Left

## 2022-01-09 MED ORDER — LIDOCAINE HCL (PF) 1 % IJ SOLN
INTRAMUSCULAR | Status: AC
  Filled 2022-01-09: qty 30

## 2022-01-09 MED ORDER — MIDAZOLAM HCL 2 MG/2ML IJ SOLN
INTRAMUSCULAR | Status: AC
  Filled 2022-01-09: qty 2

## 2022-01-09 MED ORDER — HEPARIN 6000 UNIT IRRIGATION SOLUTION
Status: DC | PRN
  Administered 2022-01-09: 1

## 2022-01-09 MED ORDER — INSULIN ASPART 100 UNIT/ML IJ SOLN: 0.0000 [IU] | INTRAMUSCULAR | Status: DC | PRN

## 2022-01-09 MED ORDER — 0.9 % SODIUM CHLORIDE (POUR BTL) OPTIME
TOPICAL | Status: DC | PRN
  Administered 2022-01-09: 1000 mL

## 2022-01-09 MED ORDER — OXYCODONE HCL 5 MG/5ML PO SOLN: 5.0000 mg | Freq: Once | ORAL | Status: DC | PRN

## 2022-01-09 MED ORDER — PROMETHAZINE HCL 25 MG/ML IJ SOLN
6.2500 mg | INTRAMUSCULAR | Status: DC | PRN
  Administered 2022-01-09: 6.25 mg via INTRAVENOUS

## 2022-01-09 MED ORDER — ONDANSETRON HCL 4 MG/2ML IJ SOLN
INTRAMUSCULAR | Status: AC
  Filled 2022-01-09: qty 2

## 2022-01-09 MED ORDER — CEFAZOLIN IN SODIUM CHLORIDE 3-0.9 GM/100ML-% IV SOLN
INTRAVENOUS | Status: AC
  Filled 2022-01-09: qty 100

## 2022-01-09 MED ORDER — PROMETHAZINE HCL 25 MG/ML IJ SOLN
INTRAMUSCULAR | Status: AC
  Filled 2022-01-09: qty 1

## 2022-01-09 MED ORDER — CHLORHEXIDINE GLUCONATE 0.12 % MT SOLN
OROMUCOSAL | Status: AC
  Administered 2022-01-09: 15 mL via OROMUCOSAL
  Filled 2022-01-09: qty 15

## 2022-01-09 MED ORDER — LIDOCAINE HCL 1 % IJ SOLN
INTRAMUSCULAR | Status: DC | PRN
  Administered 2022-01-09: 10 mL

## 2022-01-09 MED ORDER — HYDROMORPHONE HCL 1 MG/ML IJ SOLN: 0.2500 mg | INTRAMUSCULAR | Status: DC | PRN

## 2022-01-09 MED ORDER — CEFAZOLIN IN SODIUM CHLORIDE 3-0.9 GM/100ML-% IV SOLN
3.0000 g | INTRAVENOUS | Status: AC
  Administered 2022-01-09: 3 g via INTRAVENOUS

## 2022-01-09 MED ORDER — OXYCODONE HCL 5 MG PO TABS: 5.0000 mg | ORAL_TABLET | Freq: Once | ORAL | Status: DC | PRN

## 2022-01-09 MED ORDER — CHLORHEXIDINE GLUCONATE 4 % EX LIQD: 60.0000 mL | Freq: Once | CUTANEOUS | Status: DC

## 2022-01-09 MED ORDER — ORAL CARE MOUTH RINSE: 15.0000 mL | Freq: Once | OROMUCOSAL | Status: AC

## 2022-01-09 MED ORDER — FENTANYL CITRATE (PF) 250 MCG/5ML IJ SOLN
INTRAMUSCULAR | Status: AC
  Filled 2022-01-09: qty 5

## 2022-01-09 MED ORDER — SODIUM CHLORIDE 0.9 % IV SOLN: INTRAVENOUS | Status: DC

## 2022-01-09 MED ORDER — PROPOFOL 10 MG/ML IV BOLUS
INTRAVENOUS | Status: AC
  Filled 2022-01-09: qty 20

## 2022-01-09 MED ORDER — ONDANSETRON HCL 4 MG/2ML IJ SOLN
INTRAMUSCULAR | Status: DC | PRN
  Administered 2022-01-09: 4 mg via INTRAVENOUS

## 2022-01-09 MED ORDER — OXYCODONE HCL 5 MG PO TABS: 5.0000 mg | ORAL_TABLET | Freq: Four times a day (QID) | ORAL | 0 refills | Status: DC | PRN

## 2022-01-09 MED ORDER — CHLORHEXIDINE GLUCONATE 0.12 % MT SOLN: 15.0000 mL | Freq: Once | OROMUCOSAL | Status: AC

## 2022-01-09 MED ORDER — FENTANYL CITRATE (PF) 250 MCG/5ML IJ SOLN
INTRAMUSCULAR | Status: DC | PRN
Start: 2022-01-09 — End: 2022-01-09
  Administered 2022-01-09 (×2): 50 ug via INTRAVENOUS

## 2022-01-09 MED ORDER — EPHEDRINE SULFATE-NACL 50-0.9 MG/10ML-% IV SOSY
PREFILLED_SYRINGE | INTRAVENOUS | Status: DC | PRN
  Administered 2022-01-09: 5 mg via INTRAVENOUS
  Administered 2022-01-09: 10 mg via INTRAVENOUS

## 2022-01-09 MED ORDER — PHENYLEPHRINE HCL-NACL 20-0.9 MG/250ML-% IV SOLN
INTRAVENOUS | Status: DC | PRN
  Administered 2022-01-09: 20 ug/min via INTRAVENOUS

## 2022-01-09 MED ORDER — PROPOFOL 500 MG/50ML IV EMUL
INTRAVENOUS | Status: DC | PRN
  Administered 2022-01-09: 125 ug/kg/min via INTRAVENOUS
  Administered 2022-01-09: 100 ug/kg/min via INTRAVENOUS

## 2022-01-09 MED ORDER — LIDOCAINE 2% (20 MG/ML) 5 ML SYRINGE
INTRAMUSCULAR | Status: DC | PRN
  Administered 2022-01-09: 80 mg via INTRAVENOUS

## 2022-01-09 MED ORDER — HEPARIN 6000 UNIT IRRIGATION SOLUTION
Status: AC
  Filled 2022-01-09: qty 500

## 2022-01-09 SURGICAL SUPPLY — 41 items
ADH SKN CLS APL DERMABOND .7 (GAUZE/BANDAGES/DRESSINGS) ×1
ARMBAND PINK RESTRICT EXTREMIT (MISCELLANEOUS) ×2 IMPLANT
BAG COUNTER SPONGE SURGICOUNT (BAG) ×2 IMPLANT
BAG SPNG CNTER NS LX DISP (BAG) ×1
CANISTER SUCT 3000ML PPV (MISCELLANEOUS) ×3 IMPLANT
CLIP LIGATING EXTRA MED SLVR (CLIP) ×3 IMPLANT
CLIP LIGATING EXTRA SM BLUE (MISCELLANEOUS) ×3 IMPLANT
CLIP VESOCCLUDE MED 6/CT (CLIP) ×2 IMPLANT
COVER PROBE W GEL 5X96 (DRAPES) IMPLANT
DERMABOND ADVANCED (GAUZE/BANDAGES/DRESSINGS) ×1
DERMABOND ADVANCED .7 DNX12 (GAUZE/BANDAGES/DRESSINGS) ×2 IMPLANT
ELECT REM PT RETURN 9FT ADLT (ELECTROSURGICAL) ×2
ELECTRODE REM PT RTRN 9FT ADLT (ELECTROSURGICAL) ×2 IMPLANT
GLOVE BIO SURGEON STRL SZ7.5 (GLOVE) ×2 IMPLANT
GLOVE BIOGEL PI IND STRL 8 (GLOVE) ×1 IMPLANT
GLOVE BIOGEL PI INDICATOR 8 (GLOVE) ×1
GLOVE SRG 8 PF TXTR STRL LF DI (GLOVE) ×2 IMPLANT
GLOVE SURG POLYISO LF SZ8 (GLOVE) IMPLANT
GLOVE SURG UNDER POLY LF SZ8 (GLOVE) ×2
GOWN STRL REUS W/ TWL LRG LVL3 (GOWN DISPOSABLE) ×4 IMPLANT
GOWN STRL REUS W/TWL 2XL LVL3 (GOWN DISPOSABLE) ×6 IMPLANT
GOWN STRL REUS W/TWL LRG LVL3 (GOWN DISPOSABLE) ×4
KIT BASIN OR (CUSTOM PROCEDURE TRAY) ×2 IMPLANT
KIT TURNOVER KIT B (KITS) ×3 IMPLANT
NS IRRIG 1000ML POUR BTL (IV SOLUTION) ×3 IMPLANT
PACK CV ACCESS (CUSTOM PROCEDURE TRAY) ×2 IMPLANT
PAD ARMBOARD 7.5X6 YLW CONV (MISCELLANEOUS) ×6 IMPLANT
SLING ARM FOAM STRAP LRG (SOFTGOODS) IMPLANT
SLING ARM FOAM STRAP MED (SOFTGOODS) IMPLANT
SPIKE FLUID TRANSFER (MISCELLANEOUS) ×2 IMPLANT
SPONGE T-LAP 18X18 ~~LOC~~+RFID (SPONGE) ×1 IMPLANT
SUT MNCRL AB 4-0 PS2 18 (SUTURE) ×4 IMPLANT
SUT PROLENE 6 0 BV (SUTURE) IMPLANT
SUT PROLENE 7 0 BV 1 (SUTURE) ×2 IMPLANT
SUT SILK 2 0SH CR/8 30 (SUTURE) ×1 IMPLANT
SUT VIC AB 2-0 SH 18 (SUTURE) ×2 IMPLANT
SUT VIC AB 3-0 SH 27 (SUTURE) ×4
SUT VIC AB 3-0 SH 27X BRD (SUTURE) ×4 IMPLANT
TOWEL GREEN STERILE (TOWEL DISPOSABLE) ×2 IMPLANT
UNDERPAD 30X36 HEAVY ABSORB (UNDERPADS AND DIAPERS) ×2 IMPLANT
WATER STERILE IRR 1000ML POUR (IV SOLUTION) ×3 IMPLANT

## 2022-01-09 NOTE — Anesthesia Preprocedure Evaluation (Addendum)
Anesthesia Evaluation  Patient identified by MRN, date of birth, ID band Patient awake    Reviewed: Allergy & Precautions, NPO status , Patient's Chart, lab work & pertinent test results, reviewed documented beta blocker date and time   History of Anesthesia Complications Negative for: history of anesthetic complications  Airway Mallampati: II  TM Distance: >3 FB Neck ROM: Full    Dental  (+) Dental Advisory Given   Pulmonary neg pulmonary ROS,    breath sounds clear to auscultation       Cardiovascular hypertension, Pt. on medications and Pt. on home beta blockers (-) angina+CHF   Rhythm:Regular Rate:Normal  '20 ECHO: EF 55-60%, normal LVF, no significant valvular abnormalities   Neuro/Psych negative neurological ROS     GI/Hepatic Neg liver ROS, GERD  Controlled,  Endo/Other  diabetes, Oral Hypoglycemic Agents, Insulin DependentMorbid obesity  Renal/GU ESRFRenal disease (K+ 4.2)     Musculoskeletal   Abdominal (+) + obese,   Peds  Hematology negative hematology ROS (+)   Anesthesia Other Findings   Reproductive/Obstetrics                            Anesthesia Physical  Anesthesia Plan  ASA: 4  Anesthesia Plan: MAC   Post-op Pain Management: Minimal or no pain anticipated   Induction: Intravenous  PONV Risk Score and Plan: 1 and Ondansetron and Treatment may vary due to age or medical condition  Airway Management Planned: Natural Airway and Simple Face Mask  Additional Equipment: None  Intra-op Plan:   Post-operative Plan:   Informed Consent: I have reviewed the patients History and Physical, chart, labs and discussed the procedure including the risks, benefits and alternatives for the proposed anesthesia with the patient or authorized representative who has indicated his/her understanding and acceptance.     Dental advisory given  Plan Discussed with: CRNA and  Surgeon  Anesthesia Plan Comments:        Anesthesia Quick Evaluation

## 2022-01-09 NOTE — Anesthesia Postprocedure Evaluation (Signed)
Anesthesia Post Note  Patient: [name]  Procedure(s) Performed: LEFT BRACHIOCEPHALIC FISTULA SUPERFICIALIZATION (Left)     Patient location during evaluation: PACU Anesthesia Type: MAC Level of consciousness: awake and alert Pain management: pain level controlled Vital Signs Assessment: post-procedure vital signs reviewed and stable Respiratory status: spontaneous breathing, nonlabored ventilation and respiratory function stable Cardiovascular status: blood pressure returned to baseline and stable Postop Assessment: no apparent nausea or vomiting Anesthetic complications: no   No notable events documented.  Last Vitals:  Vitals:   01/09/22 0945 01/09/22 1000  BP: 135/67 (!) 147/64  Pulse: 63 61  Resp: 17 19  Temp:  36.5 C  SpO2: 97% 94%    Last Pain:  Vitals:   01/09/22 1000  TempSrc:   PainSc: 0-No pain                 Lynda Rainwater

## 2022-01-09 NOTE — H&P (Signed)
NOTE   HPI:  This is a 66 y.o. male who has CKD stage IV and  is s/p left BC AV fistula creation on 06/06/21 by Dr. Virl Cagey.  He is currently not on HD.  He is followed by Dr. Moshe Cipro. Fistula is deep on imaging   Pt in in preop today for fistula superficialization    No Known Allergies  Current Facility-Administered Medications  Medication Dose Route Frequency Provider Last Rate Last Admin   0.9 %  sodium chloride infusion   Intravenous Continuous Broadus John, MD       0.9 %  sodium chloride infusion   Intravenous Continuous Lynda Rainwater, MD       0.9 % irrigation (POUR BTL)    PRN Broadus John, MD   1,000 mL at 01/09/22 0730   ceFAZolin (ANCEF) 3-0.9 GM/100ML-% IVPB            ceFAZolin (ANCEF) IVPB 3g/100 mL premix  3 g Intravenous 30 min Pre-Op Broadus John, MD       chlorhexidine (HIBICLENS) 4 % liquid 4 Application  60 mL Topical Once Broadus John, MD       And   [START ON 01/10/2022] chlorhexidine (HIBICLENS) 4 % liquid 4 Application  60 mL Topical Once Broadus John, MD       insulin aspart (novoLOG) injection 0-14 Units  0-14 Units Subcutaneous Q2H PRN Lynda Rainwater, MD       ondansetron [facility]) 4 MG/2ML injection              ROS:  See HPI  Physical Exam:    Incision:  well healed  Extremities:  Palpable thrill in the fistula near the distal anastomosis.. I could not appreciate a thrill as I palpated proximally due to the depth.  Grip 5/5 B equal, sensation intact. Lungs: non labored breathing      Findings:  +--------------------+----------+-----------------+--------+  AVF                 PSV (cm/s)Flow Vol (mL/min)Comments  +--------------------+----------+-----------------+--------+  Native artery inflow   225          1910                 +--------------------+----------+-----------------+--------+  AVF Anastomosis        289                                +--------------------+----------+-----------------+--------+      +------------+----------+-------------+----------+----------------+  OUTFLOW VEINPSV (cm/s)Diameter (cm)Depth (cm)    Describe      +------------+----------+-------------+----------+----------------+  Shoulder       138        0.56        2.32                     +------------+----------+-------------+----------+----------------+  Prox UA        155        0.55        1.73                     +------------+----------+-------------+----------+----------------+  Mid UA         124        0.50        0.77                     +------------+----------+-------------+----------+----------------+  Dist UA  180        0.69        0.46   competing branch  +------------+----------+-------------+----------+----------------+  AC Fossa       132        0.79        0.28                     +------------+----------+-------------+----------+----------------+     Summary:  Patent arteriovenous fistula.   Assessment/Plan:  This is a 66 y.o. male who is s/p:left Brachial Cephalic AV fistula The fistula is maturing well, but is too deep to access with a distal competing branch.   After discussing the risks and benefits of left arm superficialization , Festus Holts elected to proceed.    Broadus John [facility] [phone]

## 2022-01-09 NOTE — Op Note (Signed)
    NAME: [name]    MRN: [id number] DOB: January 26, 1956    DATE OF OPERATION: 01/09/2022  PREOP DIAGNOSIS:    CKD stage 4  POSTOP DIAGNOSIS:    Same  PROCEDURE:    Left brachiocephalic fistula revision - superficialization  SURGEON: Broadus John  ASSIST: Roxy Horseman  ANESTHESIA: Moderate   EBL: 87m  INDICATIONS:    SJAMIS KRYDERis a 66y.o. male s/p left BC AV fistula creation on 06/06/21 by Dr. RVirl Cagey Imaging demonstrated adequate size and flow, however the fistula was noted.  After discussing the risk and benefits of superficialization, we elected to proceed.  FINDINGS:    617mbrachiocephalic fistula, deep with multiple branches  TECHNIQUE:   Patient was brought to the OR and laid in supine position.  Moderate anesthesia was induced. The patient was prepped and draped in standard fashion.  Lidocaine was brought to the field and a local block was performed.   The case began with left arm ultrasound fistula mapping.  Multiple branches were noted and marked.  Three longitudinal skip incisions were made along the course of the basilic vein with 5 cm bridges.  This was carried through the subcutaneous fat to the brachiobasilic fistula.  The fistula was mobilized and multiple branches ligated using 2-0 silk and clips.  Once mobilized, the subcutaneous tissue plane was made less than 5 mm from skin between the dermis and subcutaneous fat.  The subcutaneous fat was opposed to the opposite side using 2.0 vicryl, creating a bedding for the fistula.  Hemostasis was achieved with the use of cautery and suture.  The subcutaneous flap was closed using 3-0 Vicryl and 4-0 Monocryl at the skin.  There was a palpable thrill in the fistula at case completion with excellent pulse in the wrist.     Given the complexity of the case,  the assistant was necessary in order to expedient the procedure and safely perform the technical aspects of the operation.  The assistant provided  traction and countertraction to assist with exposure of the artery and vein.  They also assisted with suture ligation of multiple venous branches.  They played a critical role in the anastomosis. These skills, especially following the Prolene suture for the anastomosis, could not have been adequately performed by a scrub tech assistant.   JoMacie BurowsMD [facility]   01/09/2022

## 2022-01-09 NOTE — Transfer of Care (Signed)
Immediate Anesthesia Transfer of Care Note  Patient: [name]  Procedure(s) Performed: LEFT BRACHIOCEPHALIC FISTULA SUPERFICIALIZATION (Left)  Patient Location: PACU  Anesthesia Type:MAC  Level of Consciousness: sedated  Airway & Oxygen Therapy: Patient Spontanous Breathing  Post-op Assessment: Report given to RN and Post -op Vital signs reviewed and stable  Post vital signs: Reviewed and stable  Last Vitals:  Vitals Value Taken Time  BP 104/49   Temp 36   Pulse 61   Resp 16   SpO2 96     Last Pain:  Vitals:   01/09/22 0655  TempSrc:   PainSc: 0-No pain         Complications: No notable events documented.

## 2022-01-09 NOTE — Progress Notes (Signed)
7035 pt. Had some emesis, pt. States it was clear. states he feels better and not nauseated now.

## 2022-01-10 ENCOUNTER — Encounter (HOSPITAL_COMMUNITY): Payer: Self-pay | Admitting: Vascular Surgery

## 2022-01-15 ENCOUNTER — Encounter (HOSPITAL_COMMUNITY): Payer: Self-pay

## 2022-01-15 ENCOUNTER — Other Ambulatory Visit: Payer: Self-pay

## 2022-01-15 ENCOUNTER — Emergency Department (HOSPITAL_COMMUNITY): Payer: Medicare Other

## 2022-01-15 ENCOUNTER — Inpatient Hospital Stay (HOSPITAL_COMMUNITY)
Admission: EM | Admit: 2022-01-15 | Discharge: 2022-02-02 | DRG: 553 | Disposition: A | Discharge status: Skilled Nursing Facility | Payer: Medicare Other | Attending: Internal Medicine | Admitting: Internal Medicine

## 2022-01-15 ENCOUNTER — Inpatient Hospital Stay (HOSPITAL_COMMUNITY): Payer: Medicare Other

## 2022-01-15 DIAGNOSIS — Z7984 Long term (current) use of oral hypoglycemic drugs: Secondary | ICD-10-CM

## 2022-01-15 DIAGNOSIS — I10 Essential (primary) hypertension: Secondary | ICD-10-CM | POA: Diagnosis present

## 2022-01-15 DIAGNOSIS — Z794 Long term (current) use of insulin: Secondary | ICD-10-CM

## 2022-01-15 DIAGNOSIS — G9349 Other encephalopathy: Secondary | ICD-10-CM | POA: Diagnosis present

## 2022-01-15 DIAGNOSIS — I132 Hypertensive heart and chronic kidney disease with heart failure and with stage 5 chronic kidney disease, or end stage renal disease: Secondary | ICD-10-CM | POA: Diagnosis present

## 2022-01-15 DIAGNOSIS — Z7901 Long term (current) use of anticoagulants: Secondary | ICD-10-CM | POA: Diagnosis not present

## 2022-01-15 DIAGNOSIS — M170 Bilateral primary osteoarthritis of knee: Secondary | ICD-10-CM | POA: Diagnosis present

## 2022-01-15 DIAGNOSIS — K219 Gastro-esophageal reflux disease without esophagitis: Secondary | ICD-10-CM | POA: Diagnosis present

## 2022-01-15 DIAGNOSIS — M109 Gout, unspecified: Secondary | ICD-10-CM | POA: Diagnosis not present

## 2022-01-15 DIAGNOSIS — M7989 Other specified soft tissue disorders: Secondary | ICD-10-CM

## 2022-01-15 DIAGNOSIS — D631 Anemia in chronic kidney disease: Secondary | ICD-10-CM | POA: Diagnosis present

## 2022-01-15 DIAGNOSIS — E8881 Metabolic syndrome: Secondary | ICD-10-CM | POA: Diagnosis present

## 2022-01-15 DIAGNOSIS — M10061 Idiopathic gout, right knee: Secondary | ICD-10-CM | POA: Diagnosis present

## 2022-01-15 DIAGNOSIS — M10042 Idiopathic gout, left hand: Secondary | ICD-10-CM | POA: Diagnosis present

## 2022-01-15 DIAGNOSIS — G9340 Encephalopathy, unspecified: Secondary | ICD-10-CM | POA: Diagnosis present

## 2022-01-15 DIAGNOSIS — C61 Malignant neoplasm of prostate: Secondary | ICD-10-CM

## 2022-01-15 DIAGNOSIS — E11649 Type 2 diabetes mellitus with hypoglycemia without coma: Secondary | ICD-10-CM | POA: Diagnosis present

## 2022-01-15 DIAGNOSIS — T380X5A Adverse effect of glucocorticoids and synthetic analogues, initial encounter: Secondary | ICD-10-CM | POA: Diagnosis present

## 2022-01-15 DIAGNOSIS — R6520 Severe sepsis without septic shock: Secondary | ICD-10-CM | POA: Diagnosis not present

## 2022-01-15 DIAGNOSIS — K921 Melena: Secondary | ICD-10-CM

## 2022-01-15 DIAGNOSIS — N186 End stage renal disease: Secondary | ICD-10-CM | POA: Diagnosis present

## 2022-01-15 DIAGNOSIS — T361X5A Adverse effect of cephalosporins and other beta-lactam antibiotics, initial encounter: Secondary | ICD-10-CM | POA: Diagnosis present

## 2022-01-15 DIAGNOSIS — E871 Hypo-osmolality and hyponatremia: Secondary | ICD-10-CM | POA: Diagnosis not present

## 2022-01-15 DIAGNOSIS — R54 Age-related physical debility: Secondary | ICD-10-CM | POA: Diagnosis present

## 2022-01-15 DIAGNOSIS — E875 Hyperkalemia: Secondary | ICD-10-CM | POA: Diagnosis not present

## 2022-01-15 DIAGNOSIS — Z20822 Contact with and (suspected) exposure to covid-19: Secondary | ICD-10-CM | POA: Diagnosis present

## 2022-01-15 DIAGNOSIS — E669 Obesity, unspecified: Secondary | ICD-10-CM

## 2022-01-15 DIAGNOSIS — R6510 Systemic inflammatory response syndrome (SIRS) of non-infectious origin without acute organ dysfunction: Secondary | ICD-10-CM

## 2022-01-15 DIAGNOSIS — I480 Paroxysmal atrial fibrillation: Secondary | ICD-10-CM | POA: Diagnosis present

## 2022-01-15 DIAGNOSIS — E861 Hypovolemia: Secondary | ICD-10-CM | POA: Diagnosis present

## 2022-01-15 DIAGNOSIS — R9389 Abnormal findings on diagnostic imaging of other specified body structures: Secondary | ICD-10-CM

## 2022-01-15 DIAGNOSIS — Z7982 Long term (current) use of aspirin: Secondary | ICD-10-CM

## 2022-01-15 DIAGNOSIS — M10062 Idiopathic gout, left knee: Principal | ICD-10-CM | POA: Diagnosis present

## 2022-01-15 DIAGNOSIS — E1165 Type 2 diabetes mellitus with hyperglycemia: Secondary | ICD-10-CM | POA: Diagnosis present

## 2022-01-15 DIAGNOSIS — T82838A Hemorrhage of vascular prosthetic devices, implants and grafts, initial encounter: Secondary | ICD-10-CM | POA: Diagnosis not present

## 2022-01-15 DIAGNOSIS — Y841 Kidney dialysis as the cause of abnormal reaction of the patient, or of later complication, without mention of misadventure at the time of the procedure: Secondary | ICD-10-CM | POA: Diagnosis not present

## 2022-01-15 DIAGNOSIS — R29898 Other symptoms and signs involving the musculoskeletal system: Secondary | ICD-10-CM

## 2022-01-15 DIAGNOSIS — A419 Sepsis, unspecified organism: Secondary | ICD-10-CM

## 2022-01-15 DIAGNOSIS — Z992 Dependence on renal dialysis: Secondary | ICD-10-CM

## 2022-01-15 DIAGNOSIS — Z6838 Body mass index (BMI) 38.0-38.9, adult: Secondary | ICD-10-CM

## 2022-01-15 DIAGNOSIS — G253 Myoclonus: Secondary | ICD-10-CM | POA: Diagnosis not present

## 2022-01-15 DIAGNOSIS — R918 Other nonspecific abnormal finding of lung field: Secondary | ICD-10-CM | POA: Diagnosis not present

## 2022-01-15 DIAGNOSIS — D6489 Other specified anemias: Secondary | ICD-10-CM | POA: Diagnosis present

## 2022-01-15 DIAGNOSIS — I5032 Chronic diastolic (congestive) heart failure: Secondary | ICD-10-CM | POA: Diagnosis present

## 2022-01-15 DIAGNOSIS — E8809 Other disorders of plasma-protein metabolism, not elsewhere classified: Secondary | ICD-10-CM | POA: Diagnosis present

## 2022-01-15 DIAGNOSIS — N185 Chronic kidney disease, stage 5: Secondary | ICD-10-CM | POA: Diagnosis not present

## 2022-01-15 DIAGNOSIS — R4182 Altered mental status, unspecified: Secondary | ICD-10-CM | POA: Diagnosis not present

## 2022-01-15 DIAGNOSIS — N179 Acute kidney failure, unspecified: Secondary | ICD-10-CM | POA: Diagnosis present

## 2022-01-15 DIAGNOSIS — R7989 Other specified abnormal findings of blood chemistry: Secondary | ICD-10-CM | POA: Diagnosis present

## 2022-01-15 DIAGNOSIS — M4802 Spinal stenosis, cervical region: Secondary | ICD-10-CM | POA: Diagnosis present

## 2022-01-15 DIAGNOSIS — Z79818 Long term (current) use of other agents affecting estrogen receptors and estrogen levels: Secondary | ICD-10-CM

## 2022-01-15 DIAGNOSIS — D72829 Elevated white blood cell count, unspecified: Secondary | ICD-10-CM

## 2022-01-15 DIAGNOSIS — E11319 Type 2 diabetes mellitus with unspecified diabetic retinopathy without macular edema: Secondary | ICD-10-CM | POA: Diagnosis present

## 2022-01-15 DIAGNOSIS — E785 Hyperlipidemia, unspecified: Secondary | ICD-10-CM | POA: Diagnosis present

## 2022-01-15 DIAGNOSIS — D62 Acute posthemorrhagic anemia: Secondary | ICD-10-CM

## 2022-01-15 DIAGNOSIS — G9341 Metabolic encephalopathy: Secondary | ICD-10-CM | POA: Diagnosis present

## 2022-01-15 DIAGNOSIS — Z7401 Bed confinement status: Secondary | ICD-10-CM

## 2022-01-15 DIAGNOSIS — R7401 Elevation of levels of liver transaminase levels: Secondary | ICD-10-CM | POA: Diagnosis present

## 2022-01-15 DIAGNOSIS — E1122 Type 2 diabetes mellitus with diabetic chronic kidney disease: Secondary | ICD-10-CM | POA: Diagnosis present

## 2022-01-15 DIAGNOSIS — D649 Anemia, unspecified: Secondary | ICD-10-CM

## 2022-01-15 DIAGNOSIS — Z7952 Long term (current) use of systemic steroids: Secondary | ICD-10-CM

## 2022-01-15 DIAGNOSIS — Z79899 Other long term (current) drug therapy: Secondary | ICD-10-CM

## 2022-01-15 DIAGNOSIS — M25562 Pain in left knee: Secondary | ICD-10-CM

## 2022-01-15 DIAGNOSIS — Z833 Family history of diabetes mellitus: Secondary | ICD-10-CM

## 2022-01-15 LAB — CBC
HCT: 29.8 % — ABNORMAL LOW (ref 39.0–52.0)
Hemoglobin: 9.3 g/dL — ABNORMAL LOW (ref 13.0–17.0)
MCH: 29.1 pg (ref 26.0–34.0)
MCHC: 31.2 g/dL (ref 30.0–36.0)
MCV: 93.1 fL (ref 80.0–100.0)
Platelets: 174 10*3/uL (ref 150–400)
RBC: 3.2 MIL/uL — ABNORMAL LOW (ref 4.22–5.81)
RDW: 14.1 % (ref 11.5–15.5)
WBC: 13.3 10*3/uL — ABNORMAL HIGH (ref 4.0–10.5)
nRBC: 0 % (ref 0.0–0.2)

## 2022-01-15 LAB — CBC WITH DIFFERENTIAL/PLATELET
Abs Immature Granulocytes: 0.15 10*3/uL — ABNORMAL HIGH (ref 0.00–0.07)
Basophils Absolute: 0 10*3/uL (ref 0.0–0.1)
Basophils Relative: 0 %
Eosinophils Absolute: 0.1 10*3/uL (ref 0.0–0.5)
Eosinophils Relative: 0 %
HCT: 29.4 % — ABNORMAL LOW (ref 39.0–52.0)
Hemoglobin: 9.3 g/dL — ABNORMAL LOW (ref 13.0–17.0)
Immature Granulocytes: 1 %
Lymphocytes Relative: 11 %
Lymphs Abs: 1.5 10*3/uL (ref 0.7–4.0)
MCH: 29.1 pg (ref 26.0–34.0)
MCHC: 31.6 g/dL (ref 30.0–36.0)
MCV: 91.9 fL (ref 80.0–100.0)
Monocytes Absolute: 1.3 10*3/uL — ABNORMAL HIGH (ref 0.1–1.0)
Monocytes Relative: 9 %
Neutro Abs: 11.4 10*3/uL — ABNORMAL HIGH (ref 1.7–7.7)
Neutrophils Relative %: 79 %
Platelets: 192 10*3/uL (ref 150–400)
RBC: 3.2 MIL/uL — ABNORMAL LOW (ref 4.22–5.81)
RDW: 14.3 % (ref 11.5–15.5)
WBC: 14.5 10*3/uL — ABNORMAL HIGH (ref 4.0–10.5)
nRBC: 0 % (ref 0.0–0.2)

## 2022-01-15 LAB — COMPREHENSIVE METABOLIC PANEL
ALT: 24 U/L (ref 0–44)
AST: 80 U/L — ABNORMAL HIGH (ref 15–41)
Albumin: 1.7 g/dL — ABNORMAL LOW (ref 3.5–5.0)
Alkaline Phosphatase: 123 U/L (ref 38–126)
Anion gap: 14 (ref 5–15)
BUN: 102 mg/dL — ABNORMAL HIGH (ref 8–23)
CO2: 22 mmol/L (ref 22–32)
Calcium: 7.6 mg/dL — ABNORMAL LOW (ref 8.9–10.3)
Chloride: 103 mmol/L (ref 98–111)
Creatinine, Ser: 5.68 mg/dL — ABNORMAL HIGH (ref 0.61–1.24)
GFR, Estimated: 10 mL/min — ABNORMAL LOW (ref >60)
Glucose, Bld: 104 mg/dL — ABNORMAL HIGH (ref 70–99)
Potassium: 3.7 mmol/L (ref 3.5–5.1)
Sodium: 139 mmol/L (ref 135–145)
Total Bilirubin: 1 mg/dL (ref 0.3–1.2)
Total Protein: 6.1 g/dL — ABNORMAL LOW (ref 6.5–8.1)

## 2022-01-15 LAB — PROTIME-INR
INR: 1.3 — ABNORMAL HIGH (ref 0.8–1.2)
Prothrombin Time: 15.8 seconds — ABNORMAL HIGH (ref 11.4–15.2)

## 2022-01-15 LAB — SYNOVIAL CELL COUNT + DIFF, W/ CRYSTALS
Eosinophils-Synovial: 0 % (ref 0–1)
Lymphocytes-Synovial Fld: 1 % (ref 0–20)
Monocyte-Macrophage-Synovial Fluid: 3 % — ABNORMAL LOW (ref 50–90)
Neutrophil, Synovial: 96 % — ABNORMAL HIGH (ref 0–25)
WBC, Synovial: 23835 /mm3 — ABNORMAL HIGH (ref 0–200)

## 2022-01-15 LAB — IRON AND TIBC
Iron: 13 ug/dL — ABNORMAL LOW (ref 45–182)
Saturation Ratios: 8 % — ABNORMAL LOW (ref 17.9–39.5)
TIBC: 171 ug/dL — ABNORMAL LOW (ref 250–450)
UIBC: 158 ug/dL

## 2022-01-15 LAB — RESP PANEL BY RT-PCR (FLU A&B, COVID) ARPGX2
Influenza A by PCR: NEGATIVE
Influenza B by PCR: NEGATIVE
SARS Coronavirus 2 by RT PCR: NEGATIVE

## 2022-01-15 LAB — TYPE AND SCREEN
ABO/RH(D): B POS
Antibody Screen: NEGATIVE

## 2022-01-15 LAB — I-STAT CHEM 8, ED
BUN: 108 mg/dL — ABNORMAL HIGH (ref 8–23)
Calcium, Ion: 0.9 mmol/L — ABNORMAL LOW (ref 1.15–1.40)
Chloride: 101 mmol/L (ref 98–111)
Creatinine, Ser: 6.3 mg/dL — ABNORMAL HIGH (ref 0.61–1.24)
Glucose, Bld: 101 mg/dL — ABNORMAL HIGH (ref 70–99)
HCT: 29 % — ABNORMAL LOW (ref 39.0–52.0)
Hemoglobin: 9.9 g/dL — ABNORMAL LOW (ref 13.0–17.0)
Potassium: 3.6 mmol/L (ref 3.5–5.1)
Sodium: 138 mmol/L (ref 135–145)
TCO2: 23 mmol/L (ref 22–32)

## 2022-01-15 LAB — FERRITIN: Ferritin: 381 ng/mL — ABNORMAL HIGH (ref 24–336)

## 2022-01-15 LAB — APTT: aPTT: 51 seconds — ABNORMAL HIGH (ref 24–36)

## 2022-01-15 LAB — MRSA NEXT GEN BY PCR, NASAL: MRSA by PCR Next Gen: NOT DETECTED

## 2022-01-15 LAB — BRAIN NATRIURETIC PEPTIDE: B Natriuretic Peptide: 497.7 pg/mL — ABNORMAL HIGH (ref 0.0–100.0)

## 2022-01-15 LAB — HEMOGLOBIN A1C
Hgb A1c MFr Bld: 9.6 % — ABNORMAL HIGH (ref 4.8–5.6)
Mean Plasma Glucose: 228.82 mg/dL

## 2022-01-15 LAB — LACTIC ACID, PLASMA: Lactic Acid, Venous: 2.6 mmol/L (ref 0.5–1.9)

## 2022-01-15 LAB — HIV ANTIBODY (ROUTINE TESTING W REFLEX): HIV Screen 4th Generation wRfx: NONREACTIVE

## 2022-01-15 LAB — CBG MONITORING, ED
Glucose-Capillary: 94 mg/dL (ref 70–99)
Glucose-Capillary: 192 mg/dL — ABNORMAL HIGH (ref 70–99)

## 2022-01-15 LAB — PROCALCITONIN: Procalcitonin: 15.66 ng/mL

## 2022-01-15 LAB — TSH: TSH: 0.874 u[IU]/mL (ref 0.350–4.500)

## 2022-01-15 LAB — PHOSPHORUS: Phosphorus: 4.9 mg/dL — ABNORMAL HIGH (ref 2.5–4.6)

## 2022-01-15 LAB — POC OCCULT BLOOD, ED: Fecal Occult Bld: POSITIVE — AB

## 2022-01-15 LAB — MAGNESIUM: Magnesium: 1.9 mg/dL (ref 1.7–2.4)

## 2022-01-15 LAB — AMMONIA: Ammonia: 27 umol/L (ref 9–35)

## 2022-01-15 MED ORDER — SODIUM CHLORIDE 0.9% FLUSH
3.0000 mL | INTRAVENOUS | Status: DC | PRN
  Administered 2022-01-24 (×2): 3 mL via INTRAVENOUS

## 2022-01-15 MED ORDER — ATORVASTATIN CALCIUM 80 MG PO TABS
80.0000 mg | ORAL_TABLET | Freq: Every day | ORAL | Status: DC
  Administered 2022-01-15 – 2022-01-17 (×3): 80 mg via ORAL
  Filled 2022-01-15: qty 1
  Filled 2022-01-15: qty 2
  Filled 2022-01-15: qty 1

## 2022-01-15 MED ORDER — VANCOMYCIN HCL 2000 MG/400ML IV SOLN
2000.0000 mg | Freq: Once | INTRAVENOUS | Status: AC
  Administered 2022-01-15: 2000 mg via INTRAVENOUS
  Filled 2022-01-15: qty 400

## 2022-01-15 MED ORDER — HYDROCORTISONE 20 MG PO TABS
20.0000 mg | ORAL_TABLET | Freq: Four times a day (QID) | ORAL | Status: DC
  Administered 2022-01-15 – 2022-01-16 (×3): 20 mg via ORAL
  Filled 2022-01-15 (×8): qty 1

## 2022-01-15 MED ORDER — ASPIRIN 81 MG PO TBEC
81.0000 mg | DELAYED_RELEASE_TABLET | Freq: Every day | ORAL | Status: DC
  Administered 2022-01-15 – 2022-01-18 (×4): 81 mg via ORAL
  Filled 2022-01-15 (×5): qty 1

## 2022-01-15 MED ORDER — ABIRATERONE ACETATE 250 MG PO TABS
1000.0000 mg | ORAL_TABLET | Freq: Every day | ORAL | Status: DC
  Filled 2022-01-15 (×2): qty 4

## 2022-01-15 MED ORDER — LACTATED RINGERS IV SOLN: INTRAVENOUS | Status: AC

## 2022-01-15 MED ORDER — HYDROMORPHONE HCL 1 MG/ML IJ SOLN
0.5000 mg | INTRAMUSCULAR | Status: DC | PRN
  Administered 2022-01-15 – 2022-01-31 (×45): 0.5 mg via INTRAVENOUS
  Filled 2022-01-15 (×3): qty 0.5
  Filled 2022-01-15: qty 1
  Filled 2022-01-15 (×13): qty 0.5
  Filled 2022-01-15: qty 1
  Filled 2022-01-15 (×17): qty 0.5
  Filled 2022-01-15: qty 1
  Filled 2022-01-15 (×10): qty 0.5

## 2022-01-15 MED ORDER — METRONIDAZOLE 500 MG/100ML IV SOLN
500.0000 mg | Freq: Once | INTRAVENOUS | Status: AC
Start: 2022-01-15 — End: 2022-01-15
  Administered 2022-01-15: 500 mg via INTRAVENOUS
  Filled 2022-01-15: qty 100

## 2022-01-15 MED ORDER — SODIUM CHLORIDE 0.9% FLUSH
3.0000 mL | Freq: Two times a day (BID) | INTRAVENOUS | Status: DC
  Administered 2022-01-15 – 2022-01-25 (×22): 3 mL via INTRAVENOUS

## 2022-01-15 MED ORDER — ONDANSETRON HCL 4 MG/2ML IJ SOLN
4.0000 mg | Freq: Four times a day (QID) | INTRAMUSCULAR | Status: DC | PRN
  Administered 2022-01-16: 4 mg via INTRAVENOUS
  Filled 2022-01-15: qty 2

## 2022-01-15 MED ORDER — DEXTROSE 50 % IV SOLN
1.0000 | Freq: Once | INTRAVENOUS | Status: AC
  Administered 2022-01-15: 50 mL via INTRAVENOUS
  Filled 2022-01-15 (×2): qty 50

## 2022-01-15 MED ORDER — PANTOPRAZOLE INFUSION (NEW) - SIMPLE MED
8.0000 mg/h | INTRAVENOUS | Status: DC
Start: 2022-01-15 — End: 2022-01-15
  Administered 2022-01-15: 8 mg/h via INTRAVENOUS
  Filled 2022-01-15: qty 80

## 2022-01-15 MED ORDER — SODIUM CHLORIDE 0.9 % IV SOLN
2.0000 g | INTRAVENOUS | Status: DC
  Administered 2022-01-16 – 2022-01-17 (×2): 2 g via INTRAVENOUS
  Filled 2022-01-15 (×3): qty 12.5

## 2022-01-15 MED ORDER — VANCOMYCIN HCL IN DEXTROSE 1-5 GM/200ML-% IV SOLN: 1000.0000 mg | Freq: Once | INTRAVENOUS | Status: DC

## 2022-01-15 MED ORDER — PANTOPRAZOLE 80MG IVPB - SIMPLE MED
80.0000 mg | Freq: Once | INTRAVENOUS | Status: DC
  Administered 2022-01-15: 80 mg via INTRAVENOUS
  Filled 2022-01-15: qty 80

## 2022-01-15 MED ORDER — LIDOCAINE HCL (PF) 1 % IJ SOLN
INTRAMUSCULAR | Status: AC
  Filled 2022-01-15: qty 30

## 2022-01-15 MED ORDER — HEPARIN SODIUM (PORCINE) 5000 UNIT/ML IJ SOLN
5000.0000 [IU] | Freq: Two times a day (BID) | INTRAMUSCULAR | Status: DC
  Administered 2022-01-15 – 2022-01-19 (×7): 5000 [IU] via SUBCUTANEOUS
  Filled 2022-01-15 (×7): qty 1

## 2022-01-15 MED ORDER — SODIUM CHLORIDE 0.9 % IV SOLN
250.0000 mL | INTRAVENOUS | Status: DC | PRN
Start: 2022-01-15 — End: 2022-01-26

## 2022-01-15 MED ORDER — SODIUM CHLORIDE 0.9% FLUSH
3.0000 mL | Freq: Two times a day (BID) | INTRAVENOUS | Status: DC
  Administered 2022-01-15 – 2022-01-25 (×18): 3 mL via INTRAVENOUS

## 2022-01-15 MED ORDER — LACTATED RINGERS IV SOLN: INTRAVENOUS | Status: DC

## 2022-01-15 MED ORDER — SODIUM CHLORIDE 0.9 % IV SOLN
2.0000 g | Freq: Once | INTRAVENOUS | Status: AC
  Administered 2022-01-15: 2 g via INTRAVENOUS
  Filled 2022-01-15: qty 12.5

## 2022-01-15 MED ORDER — CALCITRIOL 0.25 MCG PO CAPS
0.2500 ug | ORAL_CAPSULE | Freq: Every day | ORAL | Status: DC
  Administered 2022-01-15 – 2022-02-02 (×17): 0.25 ug via ORAL
  Filled 2022-01-15 (×19): qty 1

## 2022-01-15 MED ORDER — SODIUM CHLORIDE 0.9% FLUSH: 3.0000 mL | INTRAVENOUS | Status: DC | PRN

## 2022-01-15 MED ORDER — POTASSIUM CHLORIDE CRYS ER 20 MEQ PO TBCR
40.0000 meq | EXTENDED_RELEASE_TABLET | Freq: Once | ORAL | Status: AC
Start: 2022-01-15 — End: 2022-01-15
  Administered 2022-01-15: 40 meq via ORAL
  Filled 2022-01-15: qty 2

## 2022-01-15 MED ORDER — ACETAMINOPHEN 325 MG PO TABS
650.0000 mg | ORAL_TABLET | ORAL | Status: DC | PRN
  Administered 2022-01-15 – 2022-02-02 (×7): 650 mg via ORAL
  Filled 2022-01-15 (×7): qty 2

## 2022-01-15 MED ORDER — PANTOPRAZOLE SODIUM 40 MG IV SOLR
40.0000 mg | Freq: Two times a day (BID) | INTRAVENOUS | Status: DC
Start: 2022-01-15 — End: 2022-01-20
  Administered 2022-01-15 – 2022-01-19 (×9): 40 mg via INTRAVENOUS
  Filled 2022-01-15 (×10): qty 10

## 2022-01-15 MED ORDER — VANCOMYCIN VARIABLE DOSE PER UNSTABLE RENAL FUNCTION (PHARMACIST DOSING): Status: DC

## 2022-01-15 MED ORDER — INSULIN ASPART 100 UNIT/ML IJ SOLN
0.0000 [IU] | Freq: Three times a day (TID) | INTRAMUSCULAR | Status: DC
  Administered 2022-01-15: 2 [IU] via SUBCUTANEOUS
  Administered 2022-01-16 (×2): 3 [IU] via SUBCUTANEOUS
  Administered 2022-01-16: 5 [IU] via SUBCUTANEOUS
  Administered 2022-01-17 – 2022-01-18 (×6): 7 [IU] via SUBCUTANEOUS
  Administered 2022-01-19 (×2): 3 [IU] via SUBCUTANEOUS
  Administered 2022-01-19: 5 [IU] via SUBCUTANEOUS
  Administered 2022-01-20: 1 [IU] via SUBCUTANEOUS
  Administered 2022-01-20: 3 [IU] via SUBCUTANEOUS
  Administered 2022-01-21 – 2022-01-22 (×2): 2 [IU] via SUBCUTANEOUS
  Administered 2022-01-22: 5 [IU] via SUBCUTANEOUS
  Administered 2022-01-22: 20 [IU] via SUBCUTANEOUS
  Administered 2022-01-23: 2 [IU] via SUBCUTANEOUS
  Administered 2022-01-23: 7 [IU] via SUBCUTANEOUS
  Administered 2022-01-23: 5 [IU] via SUBCUTANEOUS
  Administered 2022-01-24: 1 [IU] via SUBCUTANEOUS
  Administered 2022-01-25: 2 [IU] via SUBCUTANEOUS
  Administered 2022-01-26: 9 [IU] via SUBCUTANEOUS
  Administered 2022-01-26 – 2022-01-27 (×2): 5 [IU] via SUBCUTANEOUS
  Administered 2022-01-27: 1 [IU] via SUBCUTANEOUS
  Administered 2022-01-27 – 2022-01-28 (×2): 5 [IU] via SUBCUTANEOUS
  Administered 2022-01-28: 2 [IU] via SUBCUTANEOUS
  Administered 2022-01-29: 1 [IU] via SUBCUTANEOUS
  Administered 2022-01-29: 5 [IU] via SUBCUTANEOUS
  Administered 2022-01-30: 7 [IU] via SUBCUTANEOUS
  Administered 2022-01-30: 5 [IU] via SUBCUTANEOUS
  Administered 2022-01-30: 7 [IU] via SUBCUTANEOUS
  Administered 2022-01-31: 5 [IU] via SUBCUTANEOUS
  Administered 2022-02-01: 9 [IU] via SUBCUTANEOUS

## 2022-01-15 MED ORDER — ALBUMIN HUMAN 25 % IV SOLN
25.0000 g | Freq: Once | INTRAVENOUS | Status: AC
  Administered 2022-01-15: 25 g via INTRAVENOUS
  Filled 2022-01-15: qty 100

## 2022-01-15 MED ORDER — HYDRALAZINE HCL 20 MG/ML IJ SOLN
5.0000 mg | Freq: Four times a day (QID) | INTRAMUSCULAR | Status: DC | PRN
Start: 2022-01-15 — End: 2022-01-31
  Administered 2022-01-23 – 2022-01-31 (×2): 5 mg via INTRAVENOUS
  Filled 2022-01-15 (×2): qty 1

## 2022-01-15 NOTE — ED Triage Notes (Signed)
Pt arrives from home via EMS. Pt had out patient surgery on Tuesday for a dialysis fistula. He does have colon cancer and is taking an oral pill for treatment. Per family and EMS "Pt had a Blood sugar of 30 this am, juice was given and upon recheck by EMS BG was 90" Pt reported a slide off of the bed on Friday that was not medically evaluated with c/o knee pain. There is also c/o pain in the left arm. Pt is Aax3.

## 2022-01-15 NOTE — ED Notes (Addendum)
Critical lab related to EDP

## 2022-01-15 NOTE — ED Notes (Signed)
Lab add on Procalcitonin baseline

## 2022-01-15 NOTE — ED Provider Notes (Signed)
[name]   CSN: [id number] Arrival date & time: 01/15/22  3785     History  Chief Complaint  Patient presents with   Altered Mental Status    [name] is a 66 y.o. male with past medical history of CHF, diabetes, GERD, hypertension, hyperlipidemia, CKD 4, postop day 6 from left AV fistula placement who presents the emergency department with altered mental status and recurrent hypoglycemia at home.  The patient reportedly had intermittent altered mental status for the past 3 days.  The patient's son who has been caring for him after his fistula placement notes that his altered mental status has correlated with hypoglycemia to the 30s and that he has returned to baseline after his sugar has returned to normal until this morning where he was found to be hypoglycemic and did not return to baseline after improvement in sugar.  This had prompted presentation to the emergency department.  Per the patient's son he is normally fully alert and oriented.  Patient has had a recent witnessed fall from bed with the patient did not strike his head and instead had "slid down on his knees."  He has since complained of bilateral knee pain.  No fevers at home.  No emesis or abdominal pain.  Patients son has been giving the normal dose of the patient's insulin with no recent changes to diabetes regimen.    Altered Mental Status Presenting symptoms: confusion   Associated symptoms: no abdominal pain, no fever, no palpitations, no rash, no seizures and no vomiting       Past Medical History:  Diagnosis Date   Arthritis    Cardiomyopathy    Resolved with EF 55% 2011   CARDIOMYOPATHY 04/22/2006   CARDIOMYOPATHY 04/22/2006   2- D Echo (04/2010) : The EF is probably 55% with some hypokinesis at the base of the inferior wall. Wall thickness was increased in a pattern of mild LVH. Patient followed by Dr Martinique (Cardiology)      CHF (congestive heart  failure) ([place])    Chronic kidney disease    Diabetes mellitus    DIABETIC  RETINOPATHY 02/06/2007   GERD (gastroesophageal reflux disease)    Hyperlipemia    Hypertension    Iron deficiency 04/09/2018   NEPHROPATHY, DIABETIC 06/08/2006   Paroxysmal atrial tachycardia (HCC)     Home Medications Prior to Admission medications   Medication Sig Start Date End Date Taking? Authorizing Provider  abiraterone acetate (ZYTIGA) 250 MG tablet Take 1,000 mg by mouth daily. 09/21/21   [provider]  acetaminophen (TYLENOL) 500 MG tablet Take 500-1,000 mg by mouth every 6 (six) hours as needed for moderate pain.    [provider]  aspirin 81 MG tablet Take 1 tablet (81 mg total) by mouth daily. 12/19/15   Debbrah Alar, NP  atorvastatin (LIPITOR) 80 MG tablet Take 1 tablet (80 mg total) by mouth daily. 03/02/19   Katherine Roan, MD  BAYER MICROLET LANCETS lancets Check blood sugar 3 times a day as instructed 10/03/17   Katherine Roan, MD  Blood Glucose Monitoring Suppl (ONE TOUCH ULTRA MINI) w/Device KIT 1 strip by Other route 3 (three) times daily. 11/24/15   Corky Sox, MD  calcitRIOL (ROCALTROL) 0.25 MCG capsule Take 0.25 mcg by mouth daily. 11/01/20   [provider]  carvedilol (COREG) 25 MG tablet TAKE 1 TABLET BY MOUTH TWICE DAILY WITH A MEAL 05/27/19   Katherine Roan, MD  furosemide (LASIX) 80 MG tablet Take 80 mg by mouth 2 (two) times daily. 12/09/19   [provider]  glipiZIDE (GLUCOTROL) 10 MG tablet Take 10 mg by mouth 2 (two) times daily. 07/22/20   [provider]  glucose blood (ONE TOUCH ULTRA TEST) test strip The patient is insulin requiring, ICD 10 code E11.65. The patient tests 3 times per day. 05/16/18   Forde Dandy, PharmD  hydrALAZINE (APRESOLINE) 50 MG tablet Take 1.5 tablets (75 mg total) by mouth 3 (three) times daily. Patient taking differently: Take 50 mg by mouth in the morning and at bedtime. 05/04/19    Jeanmarie Hubert, MD  INSULIN LISPRO, 1 UNIT DIAL, [place] Inject 15 Units into the skin 3 (three) times daily. 200 units/ml 05/20/19   [provider]  Insulin Pen Needle 31G X 6 MM MISC 1 Stick by Other route 3 (three) times daily. Use with insulin and liraglutide. 10/03/17   Katherine Roan, MD  LEVEMIR 100 UNIT/ML injection Inject 40 Units into the skin 2 (two) times daily. 08/23/20   [provider]  metFORMIN (GLUCOPHAGE) 500 MG tablet Take 1 tablet (500 mg total) by mouth 2 (two) times daily with a meal. Patient not taking: Reported on 06/05/2021 05/06/18 05/06/19  Katherine Roan, MD  oxyCODONE (OXY IR/ROXICODONE) 5 MG immediate release tablet Take 1 tablet (5 mg total) by mouth every 6 (six) hours as needed (for pain score of 1-4). 01/09/22   Ulyses Amor, PA-C  predniSONE (DELTASONE) 5 MG tablet Take 5 mg by mouth daily. 12/25/21   [provider]  insulin aspart protamine - aspart (NOVOLOG MIX 70/30 FLEXPEN) (70-30) 100 UNIT/ML FlexPen Inject 60U into the skin in the morning with breakfast and in the evening 50U with dinner 09/29/18 03/02/19  Katherine Roan, MD      Allergies    Patient has no known allergies.    Review of Systems   Review of Systems  Constitutional:  Negative for chills and fever.  HENT:  Negative for ear pain and sore throat.   Eyes:  Negative for pain and visual disturbance.  Respiratory:  Negative for cough and shortness of breath.   Cardiovascular:  Negative for chest pain and palpitations.  Gastrointestinal:  Negative for abdominal pain and vomiting.  Genitourinary:  Negative for dysuria and hematuria.  Musculoskeletal:  Positive for arthralgias. Negative for back pain.  Skin:  Negative for color change and rash.  Neurological:  Negative for seizures and syncope.  Psychiatric/Behavioral:  Positive for confusion.   All other systems reviewed and are negative.   Physical Exam Updated Vital Signs BP 122/63 (BP Location: Right Arm)    Pulse 95   Temp 100.3 F (37.9 C) (Oral)   Resp 20   Ht 5' 11"  (1.803 m)   Wt 116.3 kg   SpO2 (!) 88%   BMI 35.76 kg/m  Physical Exam Vitals and nursing [name] reviewed.  Constitutional:      General: He is not in acute distress.    Appearance: He is well-developed.     Comments: Upon entering the exam room, the patient is laying in bed awake and alert.  He appears ill.  No acute distress. nontoxic-appearing  HENT:     Head: Normocephalic and atraumatic.     Comments: Normocephalic and atraumatic    Mouth/Throat:     Mouth: Mucous membranes are moist.  Eyes:     Conjunctiva/sclera: Conjunctivae normal.     Comments: Mild  anisocoria with right pupil 1 mm larger than left.  Both nipples are equal round and reactive to light bilaterally.  Extraocular movements are intact.  Cardiovascular:     Rate and Rhythm: Normal rate and regular rhythm.     Heart sounds: No murmur heard.    Comments: Regular rate and rhythm no murmurs rubs or gallops.  Peripheral pulses intact Pulmonary:     Effort: Pulmonary effort is normal. No respiratory distress.     Breath sounds: Normal breath sounds.     Comments: Patient is saturating in the mid to high 80s on room air.  Faint bibasilar Rales.  He is nontachypneic and speaking in full sentences without difficulty.  He is not in respiratory distress. Abdominal:     Palpations: Abdomen is soft.     Tenderness: There is no abdominal tenderness.     Comments: Patient's abdomen is soft, nondistended and nontender.  Musculoskeletal:        General: No swelling.     Cervical back: Neck supple.     Comments: Symmetric pitting edema of the bilateral lower extremities.  Skin:    General: Skin is warm and dry.     Capillary Refill: Capillary refill takes less than 2 seconds.     Comments: No surrounding skin changes to suggest infectious process to patient's newly placed AV fistula in the left upper extremity.  Neurological:     Mental Status: He is alert.      Comments: The patient is oriented to person, place but not to time or to situation.  Grip strength is symmetric bilaterally.  Lower extremity strength is symmetric.  Sensation is intact in the lower and upper extremities.  Patient is unable to form remainder of neurologic exam due to arthralgias in all 4 extremities.  Psychiatric:        Mood and Affect: Mood normal.     ED Results / Procedures / Treatments   Labs (all labs ordered are listed, but only abnormal results are displayed) Labs Reviewed  COMPREHENSIVE METABOLIC PANEL  CBC  CBG MONITORING, ED  CBG MONITORING, ED    EKG None  Radiology No results found.  Procedures Procedures    Medications Ordered in ED Medications - No data to display  ED Course/ Medical Decision Making/ A&P                           Medical Decision Making Amount and/or Complexity of Data Reviewed Labs: ordered. Radiology: ordered. ECG/medicine tests: ordered.  Risk Prescription drug management. Decision regarding hospitalization.     Patient presents to the emergency department hemodynamically stable with altered mental status intermittent hypoglycemia and new oxygen requirement.  Differential diagnosis includes sepsis secondary to pneumonia source as the patient is hypoxic versus altered mentation versus uremia with mental status changes versus less likely intracranial bleed given no localizing features on exam.  Despite the patient's complaint of diffuse arthralgias he does not have any focal findings consistent with septic joint.  We will obtain broad altered mental status work-up including CT head.  Will obtain septic work-up as well given altered mentation and borderline fever upon presentation.  Will give broad-spectrum antibiotics.  Patient laboratory work-up was remarkable for hemoglobin drop from proximal 10.5-9.3.  Fecal occult blood test is positive.  CT head negative.  Will admit patient for altered mental status and  likely low-grade GI bleed.  Patient given Protonix.  Patient was admitted to hospitalist medicine  for continued work-up of altered mental status and GI bleed.         Final Clinical Impression(s) / ED Diagnoses Final diagnoses:  Altered mental status, unspecified altered mental status type  Encephalopathy    Rx / DC Orders ED Discharge Orders     None         Levie Heritage, MD 01/15/22 1553    Lacretia Leigh, MD 01/16/22 [phone]

## 2022-01-15 NOTE — ED Provider Notes (Signed)
I saw and evaluated the patient, reviewed the resident's note and I agree with the findings and plan.   66 year old male presents with altered middle status times several days.  Patient is predialysis and has a history of stage IV kidney disease.  On patient's exam here.  He has been hypoglycemic.  Patient is drowsy but oriented to person and place.  That was treated.  Patient's hyperglycemia treated with juice.  Plan will be for patient to have septic work-up here.  Patient also to be mildly hypoxemic.  Could have a pneumonia.  Will start sepsis protocol.  We will hold off on IV fluids due to patient's history of chronic kidney disease.  We will initiate IV antibiotics.  Patient will require admission   Lacretia Leigh, MD 01/15/22 1004

## 2022-01-15 NOTE — ED Notes (Signed)
Family at bedside. 

## 2022-01-15 NOTE — ED Notes (Signed)
ED Provider at bedside. 

## 2022-01-15 NOTE — Consult Note (Addendum)
ORTHOPAEDIC CONSULTATION  REQUESTING PHYSICIAN: Lequita Halt, MD  PCP:  Sonia Side., FNP  Chief Complaint: Left knee pain, effusion  HPI: [name] is a 66 y.o. male who presented to [facility] ED due to confusion and hypoglycemia. Patient underwent AV fistula placement for future hemodialysis plans at [facility] on Tuesday. On examination in the ED, he was found to meet criteria for sepsis with unknown source. He complained of knee pain over the past several days, and they obtained an x-ray of the left knee which showed advanced osteoarthritis. Orthopaedics was consulted for arthrocentesis and evaluation of the knees.  On exam in the ER this evening, the patient is resting in a stretcher with his son at the bedside. They tell me he has not had a longstanding issue with this knee despite his findings of advanced osteoarthritis on x-ray. He saw an orthopaedic group once, primarily to be sent to PT for strengthening but has not had treatment for the knees in the past.  The son tells me he had a fall on Friday at home which required EMS to come out to get the patient up off of the floor. After that, the patient complained of significant bilateral knee pain which was increased with movement and ambulation.     Past Medical History:  Diagnosis Date   Arthritis    Cardiomyopathy    Resolved with EF 55% 2011   CARDIOMYOPATHY 04/22/2006   CARDIOMYOPATHY 04/22/2006   2- D Echo (04/2010) : The EF is probably 55% with some hypokinesis at the base of the inferior wall. Wall thickness was increased in a pattern of mild LVH. Patient followed by Dr Martinique (Cardiology)      CHF (congestive heart failure) ([place])    Chronic kidney disease    Diabetes mellitus    DIABETIC  RETINOPATHY 02/06/2007   GERD (gastroesophageal reflux disease)    Hyperlipemia    Hypertension    Iron deficiency 04/09/2018   NEPHROPATHY, DIABETIC 06/08/2006   Paroxysmal atrial tachycardia (HCC)    Past Surgical History:   Procedure Laterality Date   AV FISTULA PLACEMENT Left 06/06/2021   Procedure: LEFT BRACHIOCEPHALIC ARTERIOVENOUS (AV) FISTULA CREATION;  Surgeon: Broadus John, MD;  Location: [facility] OR;  Service: Vascular;  Laterality: Left;  PERIPHERAL NERVE BLOCK   CARDIAC CATHETERIZATION  Dec 2007   normal; EF- 20-25%   FISTULA SUPERFICIALIZATION Left 01/09/2022   Procedure: LEFT BRACHIOCEPHALIC FISTULA SUPERFICIALIZATION;  Surgeon: Broadus John, MD;  Location: [facility] OR;  Service: Vascular;  Laterality: Left;  PERIPHERAL NERVE BLOCK   Social History   Socioeconomic History   Marital status: Single    Spouse name: Not on file   Number of children: Not on file   Years of education: Not on file   Highest education level: Not on file  Occupational History   Not on file  Tobacco Use   Smoking status: Never    Passive exposure: Never   Smokeless tobacco: Never  Vaping Use   Vaping Use: Never used  Substance and Sexual Activity   Alcohol use: No    Alcohol/week: 0.0 standard drinks of alcohol   Drug use: No   Sexual activity: Not on file  Other Topics Concern   Not on file  Social History Narrative   Not on file   Social Determinants of Health   Financial Resource Strain: Not on file  Food Insecurity: Not on file  Transportation Needs: Not on file  Physical  Activity: Not on file  Stress: Not on file  Social Connections: Not on file   Family History  Problem Relation Age of Onset   Diabetes Mother    Diabetes Brother    No Known Allergies Prior to Admission medications   Medication Sig Start Date End Date Taking? Authorizing Provider  abiraterone acetate (ZYTIGA) 250 MG tablet Take 1,000 mg by mouth daily. 09/21/21  Yes [provider]  acetaminophen (TYLENOL) 500 MG tablet Take 500-1,000 mg by mouth every 6 (six) hours as needed for moderate pain.   Yes [provider]  aspirin 81 MG tablet Take 1 tablet (81 mg total) by mouth daily. 12/19/15  Yes Debbrah Alar,  NP  atorvastatin (LIPITOR) 80 MG tablet Take 1 tablet (80 mg total) by mouth daily. 03/02/19  Yes Katherine Roan, MD  calcitRIOL (ROCALTROL) 0.25 MCG capsule Take 0.25 mcg by mouth daily. 11/01/20  Yes [provider]  carvedilol (COREG) 25 MG tablet TAKE 1 TABLET BY MOUTH TWICE DAILY WITH A MEAL 05/27/19  Yes Katherine Roan, MD  furosemide (LASIX) 80 MG tablet Take 80 mg by mouth 2 (two) times daily. 12/09/19  Yes [provider]  glipiZIDE (GLUCOTROL) 10 MG tablet Take 10 mg by mouth 2 (two) times daily. 07/22/20  Yes [provider]  hydrALAZINE (APRESOLINE) 50 MG tablet Take 1.5 tablets (75 mg total) by mouth 3 (three) times daily. Patient taking differently: Take 50 mg by mouth in the morning and at bedtime. 05/04/19  Yes Jeanmarie Hubert, MD  INSULIN LISPRO, 1 UNIT DIAL, [place] Inject 15 Units into the skin 3 (three) times daily. 200 units/ml 05/20/19  Yes [provider]  LEVEMIR 100 UNIT/ML injection Inject 40 Units into the skin 2 (two) times daily. 08/23/20  Yes [provider]  oxyCODONE (OXY IR/ROXICODONE) 5 MG immediate release tablet Take 1 tablet (5 mg total) by mouth every 6 (six) hours as needed (for pain score of 1-4). 01/09/22  Yes Ulyses Amor, PA-C  predniSONE (DELTASONE) 5 MG tablet Take 5 mg by mouth daily. 12/25/21  Yes [provider]  BAYER MICROLET LANCETS lancets Check blood sugar 3 times a day as instructed 10/03/17   Katherine Roan, MD  Blood Glucose Monitoring Suppl (ONE TOUCH ULTRA MINI) w/Device KIT 1 strip by Other route 3 (three) times daily. 11/24/15   Corky Sox, MD  glucose blood (ONE TOUCH ULTRA TEST) test strip The patient is insulin requiring, ICD 10 code E11.65. The patient tests 3 times per day. 05/16/18   Forde Dandy, PharmD  Insulin Pen Needle 31G X 6 MM MISC 1 Stick by Other route 3 (three) times daily. Use with insulin and liraglutide. 10/03/17   Katherine Roan, MD  metFORMIN (GLUCOPHAGE) 500  MG tablet Take 1 tablet (500 mg total) by mouth 2 (two) times daily with a meal. Patient not taking: Reported on 06/05/2021 05/06/18 05/06/19  Katherine Roan, MD  insulin aspart protamine - aspart (NOVOLOG MIX 70/30 FLEXPEN) (70-30) 100 UNIT/ML FlexPen Inject 60U into the skin in the morning with breakfast and in the evening 50U with dinner 09/29/18 03/02/19  Katherine Roan, MD   DG Knee Complete 4 Views Right  Result Date: 01/15/2022 CLINICAL DATA:  Fall, pain and swelling, initial encounter. EXAM: RIGHT KNEE - COMPLETE 4+ VIEW COMPARISON:  None Available. FINDINGS: Large joint effusion. No fracture. Slight varus angulation of the knee joint with minimal lateral subluxation of the proximal tibia with  respect to the distal femur. Marked loss of joint space in the medial compartment with subchondral sclerosis and osteophytosis. Additional osteophytosis in the medial and patellofemoral compartments. IMPRESSION: 1. Moderate to large joint effusion.  No fracture. 2. 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 Electronically Signed   By: Lorin Picket M.D.   On: 01/15/2022 13:53   CT Head Wo Contrast  Result Date: 01/15/2022 CLINICAL DATA:  Fall off a bed on Friday. EXAM: CT HEAD WITHOUT CONTRAST TECHNIQUE: Contiguous axial images were obtained from the base of the skull through the vertex without intravenous contrast. RADIATION DOSE REDUCTION: This exam was performed according to the departmental dose-optimization program which includes automated exposure control, adjustment of the mA and/or kV according to patient size and/or use of iterative reconstruction technique. COMPARISON:  None Available. FINDINGS: Brain: There is no acute intracranial hemorrhage, extra-axial fluid collection, or acute infarct. There is mild volume loss primarily affecting the frontal lobes. The ventricles are normal in size. Gray-white differentiation is preserved. There is no mass lesion.  There is no mass  effect or midline shift. Vascular: There is calcification of the bilateral cavernous ICAs. Skull: Normal. Negative for fracture or focal lesion. Sinuses/Orbits: The paranasal sinuses are clear. The globes and orbits are unremarkable. Other: None. IMPRESSION: No acute intracranial pathology. Electronically Signed   By: Valetta Mole M.D.   On: 01/15/2022 12:30   DG Chest Port 1 View  Result Date: 01/15/2022 CLINICAL DATA:  Questionable sepsis. EXAM: PORTABLE CHEST 1 VIEW COMPARISON:  Chest radiograph dated April 20, 2008 FINDINGS: The heart is mildly enlarged. Chronic elevation of the right hemidiaphragm. Lungs are otherwise clear without evidence of focal consolidation or large pleural effusion. IMPRESSION: 1.  Stable mild cardiomegaly. 2. Chronic elevation of the right hemidiaphragm. No evidence of pneumonia or pleural effusion. Electronically Signed   By: Keane Police D.O.   On: 01/15/2022 10:54    Positive ROS: All other systems have been reviewed and were otherwise negative with the exception of those mentioned in the HPI and as above.  Physical Exam: General: Alert, no acute distress Cardiovascular: No pedal edema Respiratory: No cyanosis, no use of accessory musculature Skin: No lesions in the area of chief complaint Neurologic: Sensation intact distally Psychiatric: Patient is competent for consent with normal mood and affect  MUSCULOSKELETAL:  Bilateral LE Exam:  Bilateral hips without pain on passive ROM with log rolling.  Bilateral knees with mild warmth. Slight erythema about the anterior knees. Large effusion bilaterally. Tender diffusely to palpation. Severe pain with any attempts at passive ROM.         Assessment: Bilateral knee pain with effusion in the setting of sepsis, concerning for bilateral native knee infection ESRD, soon to begin HD DM II  Plan:  Patient with large bilateral knee effusions, pain on passive ROM. I reviewed imaging obtained in the ER, AP  and lateral XR of the left knee which show advanced tricompartmental osteoarthritis. We discussed the possibility of osteoarthritis causing effusion vs infectious cause.  Discussed aspiration of the knee, which he is agreeable to. Left knee cleaned with chloraprep and injected with 3 cc 0.25% lidocaine. Knee then re-cleansed and aspirated of 25cc blood tinged, cloudy fluid.   We will send this for gram stain and culture, but I explained to patient and family that this is very concerning for knee infection.   Patient has just eaten about 30 minutes ago, so will not be able to operate tonight.   Will plan  for tomorrow afternoon/evening tentatively.  Surgery will be I&D of bilateral knees by Dr. Alvan Dame.   Will likely require 6 weeks of IV antibiotics post-operatively.     Irving Copas, PA-C Cell [phone]   01/15/2022 5:59 PM

## 2022-01-15 NOTE — Progress Notes (Signed)
Procalcitonin significant elevated.  Left knee x-ray showed moderate to large knee effusion.  Consulted [facility] PA for bedside arthrocentesis of left knee to rule out septic arthritis.  Explained to patient and his son at bedside, all questions answered.

## 2022-01-15 NOTE — ED Notes (Signed)
Patient transported to CT 

## 2022-01-15 NOTE — H&P (Signed)
History and Physical    [name] BMS:[id number] DOB: January 23, 1956 DOA: 01/15/2022  PCP: Sonia Side., FNP (Confirm with patient/family/NH records and if not entered, this has to be entered at [facility] point of entry) Patient coming from: Home  I have personally briefly reviewed patient's old medical records in [place]  Chief Complaint: Patient confused  HPI: [name] is a 66 y.o. male with medical history significant of CKD stage V, IDDM with insulin resistance, cardiomyopathy with chronic HFpEF, HTN, HLD, stage III prostate cancer on hormone manipulation and prednisone therapy, brought in by family member for confusion and hypoglycemia.  Patient underwent left arm AV fistula surgery at [facility] this Tuesday.  He was given pain pills for the postop pain, with oxycodone every 6 hours as needed.  Which patient has been taking 3-4 times once today and Thursday.  Friday morning, patient sustained a fall and hit his left knee, when his sugar was " very low". family gave him some crackers and orange juice and his sugar level recovered in a few hours.  Since then the patient has been much bedbound due to the severe left knee pain and he continued to take oxycodone 1-2 times a day for the pain.  He also takes 2 times a day Tylenol for the chronic knee pain.  Over the weekend, patient found to have less oral intake, and meantime patient continues to take same dose of Lantus and 3 times daily AC 15 unit NovoLog.  Blood son reported that most occasions fingerstick 150s to 200s, only 1 reading was 80s on Sunday.  This morning, patient was found confused, EMS was called and EMS arrived and found patient fingerstick in the 30s and patient was given D50 and some juice and crackers glucose recovered to 90s.  At baseline, patient does have chronic leg pain mainly left knee and left ankle, for which he takes Tylenol twice daily chronically.  He uses a cane to ambulate with a limp on the left  leg.  He was evaluated by orthopedic surgery this June, was offered physical therapy.  Never had steroid shots were offered surgery before.  For stage III prostate cancer, patient has been following with oncologist at [facility], has been on prednisone and androgen antagonist for 3+ month.  Has a CKD stage V, follows with [facility], was recently offered AV fistula " to get ready for future HD" he does have a chronic elevation of BUN 100-1 30s.  Patient denies any shortness of breath, no chest pain no abdominal pain.  No history of UTI and he denies any dysuria or back pain.  ED Course: Low-grade fever 100.3, glucose 100, after several rounds of orange juice and crackers.  Blood pressure borderline low, no bolus given, given his significant CKD stage V history.  Chest x-ray no acute infiltrates.  WBC 14.5, hemoglobin 9.3, compared to baseline 10-11.  BUN 108, creatinine 6.3.  K3.6.  Lactic acid 2.6.  Patient was started on vancomycin and cefepime for sepsis  Review of Systems: As per HPI otherwise 14 point review of systems negative.    Past Medical History:  Diagnosis Date   Arthritis    Cardiomyopathy    Resolved with EF 55% 2011   CARDIOMYOPATHY 04/22/2006   CARDIOMYOPATHY 04/22/2006   2- D Echo (04/2010) : The EF is probably 55% with some hypokinesis at the base of the inferior wall. Wall thickness was increased in a pattern of mild LVH. Patient  followed by Dr Martinique (Cardiology)      CHF (congestive heart failure) ([place])    Chronic kidney disease    Diabetes mellitus    DIABETIC  RETINOPATHY 02/06/2007   GERD (gastroesophageal reflux disease)    Hyperlipemia    Hypertension    Iron deficiency 04/09/2018   NEPHROPATHY, DIABETIC 06/08/2006   Paroxysmal atrial tachycardia ([place])     Past Surgical History:  Procedure Laterality Date   AV FISTULA PLACEMENT Left 06/06/2021   Procedure: LEFT BRACHIOCEPHALIC ARTERIOVENOUS (AV) FISTULA CREATION;  Surgeon: Broadus John, MD;   Location: [place];  Service: Vascular;  Laterality: Left;  PERIPHERAL NERVE BLOCK   CARDIAC CATHETERIZATION  Dec 2007   normal; EF- 20-25%   FISTULA SUPERFICIALIZATION Left 01/09/2022   Procedure: LEFT BRACHIOCEPHALIC FISTULA SUPERFICIALIZATION;  Surgeon: Broadus John, MD;  Location: [place];  Service: Vascular;  Laterality: Left;  PERIPHERAL NERVE BLOCK     reports that he has never smoked. He has never been exposed to tobacco smoke. He has never used smokeless tobacco. He reports that he does not drink alcohol and does not use drugs.  No Known Allergies  Family History  Problem Relation Age of Onset   Diabetes Mother    Diabetes Brother      Prior to Admission medications   Medication Sig Start Date End Date Taking? Authorizing Provider  abiraterone acetate (ZYTIGA) 250 MG tablet Take 1,000 mg by mouth daily. 09/21/21  Yes [provider]  acetaminophen (TYLENOL) 500 MG tablet Take 500-1,000 mg by mouth every 6 (six) hours as needed for moderate pain.   Yes [provider]  aspirin 81 MG tablet Take 1 tablet (81 mg total) by mouth daily. 12/19/15  Yes Debbrah Alar, NP  atorvastatin (LIPITOR) 80 MG tablet Take 1 tablet (80 mg total) by mouth daily. 03/02/19  Yes Katherine Roan, MD  calcitRIOL (ROCALTROL) 0.25 MCG capsule Take 0.25 mcg by mouth daily. 11/01/20  Yes [provider]  carvedilol (COREG) 25 MG tablet TAKE 1 TABLET BY MOUTH TWICE DAILY WITH A MEAL 05/27/19  Yes Katherine Roan, MD  furosemide (LASIX) 80 MG tablet Take 80 mg by mouth 2 (two) times daily. 12/09/19  Yes [provider]  glipiZIDE (GLUCOTROL) 10 MG tablet Take 10 mg by mouth 2 (two) times daily. 07/22/20  Yes [provider]  hydrALAZINE (APRESOLINE) 50 MG tablet Take 1.5 tablets (75 mg total) by mouth 3 (three) times daily. Patient taking differently: Take 50 mg by mouth in the morning and at bedtime. 05/04/19  Yes Jeanmarie Hubert, MD  INSULIN LISPRO, 1 UNIT  DIAL, [place] Inject 15 Units into the skin 3 (three) times daily. 200 units/ml 05/20/19  Yes [provider]  LEVEMIR 100 UNIT/ML injection Inject 40 Units into the skin 2 (two) times daily. 08/23/20  Yes [provider]  oxyCODONE (OXY IR/ROXICODONE) 5 MG immediate release tablet Take 1 tablet (5 mg total) by mouth every 6 (six) hours as needed (for pain score of 1-4). 01/09/22  Yes Ulyses Amor, PA-C  predniSONE (DELTASONE) 5 MG tablet Take 5 mg by mouth daily. 12/25/21  Yes [provider]  BAYER MICROLET LANCETS lancets Check blood sugar 3 times a day as instructed 10/03/17   Katherine Roan, MD  Blood Glucose Monitoring Suppl (ONE TOUCH ULTRA MINI) w/Device KIT 1 strip by Other route 3 (three) times daily. 11/24/15   Corky Sox, MD  glucose blood (ONE TOUCH ULTRA TEST) test  strip The patient is insulin requiring, ICD 10 code E11.65. The patient tests 3 times per day. 05/16/18   Forde Dandy, PharmD  Insulin Pen Needle 31G X 6 MM MISC 1 Stick by Other route 3 (three) times daily. Use with insulin and liraglutide. 10/03/17   Katherine Roan, MD  metFORMIN (GLUCOPHAGE) 500 MG tablet Take 1 tablet (500 mg total) by mouth 2 (two) times daily with a meal. Patient not taking: Reported on 06/05/2021 05/06/18 05/06/19  Katherine Roan, MD  insulin aspart protamine - aspart (NOVOLOG MIX 70/30 FLEXPEN) (70-30) 100 UNIT/ML FlexPen Inject 60U into the skin in the morning with breakfast and in the evening 50U with dinner 09/29/18 03/02/19  Katherine Roan, MD    Physical Exam: Vitals:   01/15/22 1215 01/15/22 1230 01/15/22 1245 01/15/22 1300  BP: (!) 150/117 (!) 98/46 105/75 97/62  Pulse: (!) 127 83 82 83  Resp: 19 (!) 25 17 17   Temp:      TempSrc:      SpO2: 98% 100% 99% 99%  Weight:      Height:        Constitutional: NAD, calm, comfortable Vitals:   01/15/22 1215 01/15/22 1230 01/15/22 1245 01/15/22 1300  BP: (!) 150/117 (!) 98/46 105/75 97/62  Pulse: (!) 127 83  82 83  Resp: 19 (!) 25 17 17   Temp:      TempSrc:      SpO2: 98% 100% 99% 99%  Weight:      Height:       Eyes: PERRL, lids and conjunctivae normal ENMT: Mucous membranes are dry. Posterior pharynx clear of any exudate or lesions.Normal dentition.  Neck: normal, supple, no masses, no thyromegaly Respiratory: clear to auscultation bilaterally, no wheezing, no crackles. Normal respiratory effort. No accessory muscle use.  Cardiovascular: Regular rate and rhythm, no murmurs / rubs / gallops. No extremity edema. 2+ pedal pulses. No carotid bruits.  Abdomen: no tenderness, no masses palpated. No hepatosplenomegaly. Bowel sounds positive.  Musculoskeletal: no clubbing / cyanosis. No joint deformity upper and lower extremities. Good ROM, no contractures. Normal muscle tone.  Left knee severe tenderness, decreased ROM, however no significant swelling, not warm compared to right side Skin: no rashes, lesions, ulcers. No induration.  Left arm AV fistula surgical site appears clean, no swelling no rash no tenderness. Neurologic: CN 2-12 grossly intact. Sensation intact, DTR normal. Strength 5/5 in all 4.  Psychiatric: Normal judgment and insight. Alert and oriented x 3. Normal mood.     Labs on Admission: I have personally reviewed following labs and imaging studies  CBC: Recent Labs  Lab 01/09/22 0642 01/09/22 0647 01/15/22 0928 01/15/22 1017 01/15/22 1028  WBC  --   --  13.3*  --  14.5*  NEUTROABS  --   --   --   --  11.4*  HGB 10.9* 10.9* 9.3* 9.9* 9.3*  HCT 32.0* 32.0* 29.8* 29.0* 29.4*  MCV  --   --  93.1  --  91.9  PLT  --   --  174  --  553   Basic Metabolic Panel: Recent Labs  Lab 01/09/22 0642 01/09/22 0647 01/15/22 0928 01/15/22 1017  NA 141 141 139 138  K 3.9 3.9 3.7 3.6  CL 103 103 103 101  CO2  --   --  22  --   GLUCOSE 79 80 104* 101*  BUN >130* >130* 102* 108*  CREATININE 4.00* 4.10* 5.68* 6.30*  CALCIUM  --   --  7.6*  --    GFR: Estimated Creatinine  Clearance: 14.6 mL/min (A) (by C-G formula based on SCr of 6.3 mg/dL (H)). Liver Function Tests: Recent Labs  Lab 01/15/22 0928  AST 80*  ALT 24  ALKPHOS 123  BILITOT 1.0  PROT 6.1*  ALBUMIN 1.7*   No results for input(s): "LIPASE", "AMYLASE" in the last 168 hours. Recent Labs  Lab 01/15/22 1026  AMMONIA 27   Coagulation Profile: Recent Labs  Lab 01/15/22 1028  INR 1.3*   Cardiac Enzymes: No results for input(s): "CKTOTAL", "CKMB", "CKMBINDEX", "TROPONINI" in the last 168 hours. BNP (last 3 results) No results for input(s): "PROBNP" in the last 8760 hours. HbA1C: No results for input(s): "HGBA1C" in the last 72 hours. CBG: Recent Labs  Lab 01/09/22 0553 01/09/22 0926 01/09/22 0949 01/15/22 0904  GLUCAP 83 67* 82 94   Lipid Profile: No results for input(s): "CHOL", "HDL", "LDLCALC", "TRIG", "CHOLHDL", "LDLDIRECT" in the last 72 hours. Thyroid Function Tests: No results for input(s): "TSH", "T4TOTAL", "FREET4", "T3FREE", "THYROIDAB" in the last 72 hours. Anemia Panel: No results for input(s): "VITAMINB12", "FOLATE", "FERRITIN", "TIBC", "IRON", "RETICCTPCT" in the last 72 hours. Urine analysis:    Component Value Date/Time   COLORURINE YELLOW 05/04/2008 2129   APPEARANCEUR Cloudy (A) 08/19/2015 1403   LABSPEC 1.012 05/04/2008 2129   PHURINE 5.5 05/04/2008 2129   GLUCOSEU Negative 08/19/2015 1403   GLUCOSEU 500 (A) 05/04/2008 2129   HGBUR SMALL (A) 04/22/2008 0848   BILIRUBINUR Negative 08/19/2015 1403   KETONESUR NEG mg/dL 05/04/2008 2129   PROTEINUR 1+ (A) 08/19/2015 1403   PROTEINUR 30 (A) 05/04/2008 2129   UROBILINOGEN 0.2 05/04/2008 2129   NITRITE Negative 08/19/2015 1403   NITRITE NEG 05/04/2008 2129   LEUKOCYTESUR 1+ (A) 08/19/2015 1403    Radiological Exams on Admission: CT Head Wo Contrast  Result Date: 01/15/2022 CLINICAL DATA:  Fall off a bed on Friday. EXAM: CT HEAD WITHOUT CONTRAST TECHNIQUE: Contiguous axial images were obtained from the  base of the skull through the vertex without intravenous contrast. RADIATION DOSE REDUCTION: This exam was performed according to the departmental dose-optimization program which includes automated exposure control, adjustment of the mA and/or kV according to patient size and/or use of iterative reconstruction technique. COMPARISON:  None Available. FINDINGS: Brain: There is no acute intracranial hemorrhage, extra-axial fluid collection, or acute infarct. There is mild volume loss primarily affecting the frontal lobes. The ventricles are normal in size. Gray-white differentiation is preserved. There is no mass lesion.  There is no mass effect or midline shift. Vascular: There is calcification of the bilateral cavernous ICAs. Skull: Normal. Negative for fracture or focal lesion. Sinuses/Orbits: The paranasal sinuses are clear. The globes and orbits are unremarkable. Other: None. IMPRESSION: No acute intracranial pathology. Electronically Signed   By: Valetta Mole M.D.   On: 01/15/2022 12:30   DG Chest Port 1 View  Result Date: 01/15/2022 CLINICAL DATA:  Questionable sepsis. EXAM: PORTABLE CHEST 1 VIEW COMPARISON:  Chest radiograph dated April 20, 2008 FINDINGS: The heart is mildly enlarged. Chronic elevation of the right hemidiaphragm. Lungs are otherwise clear without evidence of focal consolidation or large pleural effusion. IMPRESSION: 1.  Stable mild cardiomegaly. 2. Chronic elevation of the right hemidiaphragm. No evidence of pneumonia or pleural effusion. Electronically Signed   By: Keane Police D.O.   On: 01/15/2022 10:54    EKG: Independently reviewed.  Sinus arrhythmia, reported as A-fib however clearly there are P waves before each QRS.  Frequent  PVCs  Assessment/Plan Principal Problem:   Sepsis ([place])  (please populate well all problems here in Problem List. (For example, if patient is on BP meds at home and you resume or decide to hold them, it is a problem that needs to be her. Same for CAD,  COPD, HLD and so on)  Sepsis -Evidenced by elevated WBCs, elevated lactate level, low-grade fever however probably masked by chronic Tylenol use, also with symptoms and signs of endorgan damage with new onset of encephalopathy.  Unknown infectious source, continue broad-spectrum coverage with vancomycin and cefepime. -Short course of IV fluid for 12 hours, monitor volume status, 1 dose of IV albumin given the significant hypoalbuminemia. -UA pending.  Acute metabolic encephalopathy -Probably combination of hypoglycemia and of sepsis as well as the narcotic use in lieu of CKD stage IV. -CT head negative for acute findings -Change oxycodone to Dilaudid -Sepsis treatment as above  Hypoglycemia with baseline IDDM -Hold off home dose of Lantus and NovoLog, continue sliding scale -Hold off metformin  Acute on chronic anemia normocytic -Guaiac and FOBT positive.  But denies any overt GI bleed or black tarry stools or rectal bleed.  Stool color was yellow according to ED physician. -Check iron study, repeat H&H tonight and consider GI consult. -Continue PPI IV twice daily  Left knee pain -X-ray to rule out fracture.  Less suspicious for infection source at this point, given no significant swelling or warmth to touch  Hypoalbuminemia -Suspect ongoing nephropathy, IV albumin for volume resuscitation.  UA pending.  HTN -Hold all home BP meds including Coreg and hydralazine -Start as needed hydralazine  CKD stage V with stable uremia and azotemia -Clinically.  Hypovolemic, IV fluids as above, 1 dose of IV albumin. -Chest x-ray tomorrow, currently there is no indication for immediate emergency dialysis.  History of chronic HFpEF -Symptoms signs of hypovolemia, IV hydration plan as above  Chronic steroid use -Switch to stress dose of hydrocortisone for now -PPI  Stage III prostate cancer -Continue androgen antagonist  DVT prophylaxis: Heparin subcu Code Status: Full code Family  Communication: Son at bedside Disposition Plan: Patient sick with new onset of sepsis, required inpatient treatment with IV antibiotics and fluid resuscitation, expect more than 2 midnight hospital stay Consults called: None Admission status: PCU   Lequita Halt MD [facility] Pager [phone]  01/15/2022, 1:20 PM

## 2022-01-15 NOTE — Progress Notes (Signed)
Pharmacy Antibiotic Note  [name] is a 66 y.o. male admitted on 01/15/2022 with  unknown source and potentially sepsis .  Pharmacy has been consulted for cefepime and vancomycin dosing.  Plan: Patient has AKI on CKD with recent fistula placement for impending dialysis. Creatinine clearance is 16.36m/min with baseline Scr of  ~4.   One time vancomycin 2,'000mg'$  then variable dose based on nephrology plans.  Cefepime dosed at 2G every 24 hours due to AKI.   Height: '5\' 11"'$  (180.3 cm) Weight: 116.3 kg (256 lb 6.4 oz) IBW/kg (Calculated) : 75.3  Temp (24hrs), Avg:100.3 F (37.9 C), Min:100.3 F (37.9 C), Max:100.3 F (37.9 C)  Recent Labs  Lab 01/09/22 0642 01/09/22 0647 01/15/22 0928  WBC  --   --  13.3*  CREATININE 4.00* 4.10* 5.68*    Estimated Creatinine Clearance: 16.1 mL/min (A) (by C-G formula based on SCr of 5.68 mg/dL (H)).    No Known Allergies   Thank you for allowing pharmacy to be a part of this patient's care.  HVentura Sellers8/14/2023 10:14 AM

## 2022-01-16 ENCOUNTER — Inpatient Hospital Stay (HOSPITAL_COMMUNITY): Payer: Medicare Other

## 2022-01-16 ENCOUNTER — Encounter (HOSPITAL_COMMUNITY)
Admission: EM | Disposition: A | Discharge status: Skilled Nursing Facility | Payer: Self-pay | Source: Home / Self Care | Attending: Internal Medicine

## 2022-01-16 DIAGNOSIS — R29898 Other symptoms and signs involving the musculoskeletal system: Secondary | ICD-10-CM

## 2022-01-16 DIAGNOSIS — R9389 Abnormal findings on diagnostic imaging of other specified body structures: Secondary | ICD-10-CM

## 2022-01-16 DIAGNOSIS — G9341 Metabolic encephalopathy: Secondary | ICD-10-CM

## 2022-01-16 DIAGNOSIS — Z7952 Long term (current) use of systemic steroids: Secondary | ICD-10-CM

## 2022-01-16 DIAGNOSIS — M25562 Pain in left knee: Secondary | ICD-10-CM

## 2022-01-16 DIAGNOSIS — C61 Malignant neoplasm of prostate: Secondary | ICD-10-CM

## 2022-01-16 DIAGNOSIS — A419 Sepsis, unspecified organism: Secondary | ICD-10-CM | POA: Diagnosis not present

## 2022-01-16 DIAGNOSIS — G9340 Encephalopathy, unspecified: Secondary | ICD-10-CM | POA: Diagnosis not present

## 2022-01-16 DIAGNOSIS — M109 Gout, unspecified: Secondary | ICD-10-CM

## 2022-01-16 DIAGNOSIS — E11649 Type 2 diabetes mellitus with hypoglycemia without coma: Secondary | ICD-10-CM

## 2022-01-16 DIAGNOSIS — R6510 Systemic inflammatory response syndrome (SIRS) of non-infectious origin without acute organ dysfunction: Secondary | ICD-10-CM

## 2022-01-16 DIAGNOSIS — R6520 Severe sepsis without septic shock: Secondary | ICD-10-CM | POA: Diagnosis not present

## 2022-01-16 LAB — CBC
HCT: 25.2 % — ABNORMAL LOW (ref 39.0–52.0)
Hemoglobin: 7.8 g/dL — ABNORMAL LOW (ref 13.0–17.0)
MCH: 28.9 pg (ref 26.0–34.0)
MCHC: 31 g/dL (ref 30.0–36.0)
MCV: 93.3 fL (ref 80.0–100.0)
Platelets: 169 10*3/uL (ref 150–400)
RBC: 2.7 MIL/uL — ABNORMAL LOW (ref 4.22–5.81)
RDW: 14.4 % (ref 11.5–15.5)
WBC: 15.1 10*3/uL — ABNORMAL HIGH (ref 4.0–10.5)
nRBC: 0 % (ref 0.0–0.2)

## 2022-01-16 LAB — BASIC METABOLIC PANEL
Anion gap: 16 — ABNORMAL HIGH (ref 5–15)
BUN: 116 mg/dL — ABNORMAL HIGH (ref 8–23)
CO2: 21 mmol/L — ABNORMAL LOW (ref 22–32)
Calcium: 7.9 mg/dL — ABNORMAL LOW (ref 8.9–10.3)
Chloride: 98 mmol/L (ref 98–111)
Creatinine, Ser: 6.85 mg/dL — ABNORMAL HIGH (ref 0.61–1.24)
GFR, Estimated: 8 mL/min — ABNORMAL LOW (ref >60)
Glucose, Bld: 253 mg/dL — ABNORMAL HIGH (ref 70–99)
Potassium: 5.3 mmol/L — ABNORMAL HIGH (ref 3.5–5.1)
Sodium: 135 mmol/L (ref 135–145)

## 2022-01-16 LAB — SYNOVIAL CELL COUNT + DIFF, W/ CRYSTALS
Eosinophils-Synovial: 0 % (ref 0–1)
Lymphocytes-Synovial Fld: 2 % (ref 0–20)
Monocyte-Macrophage-Synovial Fluid: 10 % — ABNORMAL LOW (ref 50–90)
Neutrophil, Synovial: 88 % — ABNORMAL HIGH (ref 0–25)
WBC, Synovial: 15600 /mm3 — ABNORMAL HIGH (ref 0–200)

## 2022-01-16 LAB — HEMOGLOBIN AND HEMATOCRIT, BLOOD
HCT: 25.9 % — ABNORMAL LOW (ref 39.0–52.0)
Hemoglobin: 8.3 g/dL — ABNORMAL LOW (ref 13.0–17.0)

## 2022-01-16 LAB — GLUCOSE, CAPILLARY
Glucose-Capillary: 227 mg/dL — ABNORMAL HIGH (ref 70–99)
Glucose-Capillary: 265 mg/dL — ABNORMAL HIGH (ref 70–99)
Glucose-Capillary: 296 mg/dL — ABNORMAL HIGH (ref 70–99)

## 2022-01-16 LAB — CBG MONITORING, ED
Glucose-Capillary: 246 mg/dL — ABNORMAL HIGH (ref 70–99)
Glucose-Capillary: 246 mg/dL — ABNORMAL HIGH (ref 70–99)

## 2022-01-16 LAB — PROCALCITONIN: Procalcitonin: 30.97 ng/mL

## 2022-01-16 LAB — ABO/RH: ABO/RH(D): B POS

## 2022-01-16 SURGERY — ARTHROSCOPY, KNEE
Anesthesia: Choice | Site: Knee | Laterality: Bilateral

## 2022-01-16 MED ORDER — SODIUM ZIRCONIUM CYCLOSILICATE 10 G PO PACK
10.0000 g | PACK | Freq: Every day | ORAL | Status: DC
  Administered 2022-01-16: 10 g via ORAL
  Filled 2022-01-16: qty 1

## 2022-01-16 MED ORDER — INSULIN DETEMIR 100 UNIT/ML ~~LOC~~ SOLN
20.0000 [IU] | Freq: Two times a day (BID) | SUBCUTANEOUS | Status: DC
  Administered 2022-01-16 – 2022-01-17 (×2): 20 [IU] via SUBCUTANEOUS
  Filled 2022-01-16 (×3): qty 0.2

## 2022-01-16 MED ORDER — METHYLPREDNISOLONE SODIUM SUCC 40 MG IJ SOLR
40.0000 mg | Freq: Every day | INTRAMUSCULAR | Status: DC
  Administered 2022-01-16 – 2022-01-18 (×3): 40 mg via INTRAVENOUS
  Filled 2022-01-16 (×3): qty 1

## 2022-01-16 MED ORDER — BUPIVACAINE HCL (PF) 0.5 % IJ SOLN
10.0000 mL | Freq: Once | INTRAMUSCULAR | Status: AC
  Administered 2022-01-16: 10 mL
  Filled 2022-01-16: qty 10

## 2022-01-16 NOTE — Assessment & Plan Note (Signed)
Home zytiga

## 2022-01-16 NOTE — Assessment & Plan Note (Signed)
noted 

## 2022-01-16 NOTE — Assessment & Plan Note (Signed)
Improving, but still present ? uremia

## 2022-01-16 NOTE — ED Notes (Signed)
Checked patient cbg it was 90 notified RN of blood sugar

## 2022-01-16 NOTE — Assessment & Plan Note (Signed)
SSI Resume levemir at half dose (typically on 40 units BID) Hold lispro Hold glipizide, metformin should be removed from his med list

## 2022-01-16 NOTE — Progress Notes (Signed)
Patient ID: [name], male   DOB: 10-07-1955, 66 y.o.   MRN: [id number]  Patient presented to the emergency room with bilateral painful knees.  There was concern raised that he had a septic picture.  There was question on whether or not his knees were the source of his sepsis.  He had a recent history of AV fistula procedure performed.  Based on this history and aspiration of his right knee was performed.  The overall appearance of the fluid appeared to have a significant inflammatory component versus infection.  Initial lab results:   Latest Reference Range & Units 01/15/22 17:59  Color, Synovial YELLOW  RED !  Appearance-Synovial CLEAR  TURBID !  Crystals, Fluid  EXTRACELLULAR MONOSODIUM URATE CRYSTALS  WBC, Synovial 0 - 200 /cu mm 23,835 (H)  Neutrophil, Synovial 0 - 25 % 96 (H)  Lymphocytes-Synovial Fld 0 - 20 % 1  Monocyte-Macrophage-Synovial Fluid 50 - 90 % 3 (L)  Eosinophils-Synovial 0 - 1 % 0  !: Data is abnormal (H): Data is abnormally high (L): Data is abnormally low   Upon review of these laboratory values there is no immediate concern for infection given the white blood cell count of 23,800.  There is also findings of extracellular monosodium urate crystals consistent with pseudogout.   At this point I will need to wait for cultures to confirm whether or not there is any concern for infection however at this point I would recommend medical management with higher dose prednisone or what would be acceptable as anti-inflammatory therapy given his advanced renal disease.  I will have his left knee aspirated to help decompress the joint. Ice can also be helpful for the knees. Again we will follow cultures and if the cultures turn positive then that would be our indication to proceed with surgery but at this point I would recommend medical management based on the available data.

## 2022-01-16 NOTE — Assessment & Plan Note (Signed)
Suspect related to gout flare Pain to L thumb DIP, MCP and L first finger PIP Plain films with soft tissue swelling, erosive osteoarthritis Due to recent vascular procedure, I've asked vascular to eval hand swelling, but suspect gout flare is most likely cause

## 2022-01-16 NOTE — Assessment & Plan Note (Addendum)
Baseline creatinine recently appears to be around 4 Creatinine 6.3 today Renal c/s with concern for progression of ckd

## 2022-01-16 NOTE — Assessment & Plan Note (Addendum)
Positive FOBT PPI No signs of overt bleeding, stool yellow per EDP Will follow  Iron def, anemia of chronic disease

## 2022-01-16 NOTE — Progress Notes (Signed)
PROGRESS NOTE    [name]  KCL:[id number] DOB: Mar 26, 1956 DOA: 01/15/2022 PCP: Sonia Side., FNP  Chief Complaint  Patient presents with   Altered Mental Status    Brief Narrative:  [name] is [name] 66 y.o. male with medical history significant of CKD stage V, IDDM with insulin resistance, cardiomyopathy with chronic HFpEF, HTN, HLD, stage III prostate cancer on hormone manipulation and prednisone therapy, brought in by family member for confusion and hypoglycemia.   Patient underwent left arm AV fistula surgery on 01/09/22.  Friday morning, patient sustained Claressa Hughley fall and hit his left knee, when his sugar was " very low".  Since then the patient has been much bedbound due to the severe left knee pain and he continued to take oxycodone 1-2 times [name] day for the pain.  He also takes 2 times [name] day Tylenol for the chronic knee pain.   Over the weekend, patient found to have less oral intake.   At baseline, patient does have chronic leg pain mainly left knee and left ankle.  For stage III prostate cancer, patient has been following with oncologist at [facility], has been on prednisone and androgen antagonist for 3+ month.    Has [name] CKD stage V, follows with [facility], was recently offered AV fistula " to get ready for future HD" he does have [name] chronic elevation of BUN.  He was noted to have temp 100.3, elevated WBC concerning for sepsis.  L knee swelling concerning for septic arthritis.  Now s/p aspiration, appears consistent with gout.  On IV steroids.  Renal consulted for worsening AKI and concern for possible uremeia.  Orthopedics following for L knee.  Vascular consulted with regards to L hand swelling and recent AV fistula surgery.    Assessment & Plan:   Principal Problem:   Sepsis ([place]) Active Problems:   SIRS (systemic inflammatory response syndrome) (HCC)   Swelling of left hand   Gout flare   Left knee pain   Acute metabolic encephalopathy   Hypoglycemia  associated with type 2 diabetes mellitus (HCC)   Anemia   Essential hypertension   CKD (chronic kidney disease) stage 5, GFR less than 15 ml/min (HCC)   Left arm weakness   Current chronic use of systemic steroids   Obesity (BMI 30-39.9)   Prostate cancer ([place])   Abnormal CXR   AKI (acute kidney injury) ([place])   Assessment and Plan: SIRS (systemic inflammatory response syndrome) (HCC) Synovial fluid from knee appears c/w gout flare Will continue abx for now given temperature and white count at presentation Follow cultures, maybe able to deescalate abx  Gout flare L knee synovial fluid turbid, 23,835 WBC (96% neutrophils), monosodium urate crystals Gram stain without organisms Follow culture Follow on steroids abx being continued for now, consider deescalation  Swelling of left hand Suspect related to gout flare Pain to L thumb DIP, MCP and L first finger PIP Plain films with soft tissue swelling, erosive osteoarthritis Due to recent vascular procedure, I've asked vascular to eval hand swelling, but suspect gout flare is most likely cause  Acute metabolic encephalopathy Improving, but still present ? uremia  Hypoglycemia associated with type 2 diabetes mellitus (HCC) SSI Resume levemir at half dose (typically on 40 units BID) Hold lispro Hold glipizide, metformin should be removed from his med list  Anemia Positive FOBT PPI No signs of overt bleeding, stool yellow per EDP Will follow  Iron def, anemia of chronic disease  CKD (  chronic kidney disease) stage 5, GFR less than 15 ml/min (HCC) Baseline creatinine recently appears to be around 4 Creatinine 6.3 today Renal c/s with concern for progression of ckd  Essential hypertension Holding coreg, hydralazine with borderline BP's at presentation  Left arm weakness With encephalopathy, will follow MRI  Related to pain?  Current chronic use of systemic steroids Transitioned to IV steroids, he mentions related to  prostate cancer?  Abnormal CXR Follow echo for enlarged cardiopericardial sihouette  Prostate cancer ([place]) Home zytiga  Obesity (BMI 30-39.9) noted     DVT prophylaxis: heparin Code Status: full Family Communication: son Disposition:   Status is: Inpatient Remains inpatient appropriate because: pending improvement   Consultants:  Orthopedics Vascular nephrology  Procedures:  Arthrocentesis x2  Antimicrobials:  Anti-infectives (From admission, onward)    Start     Dose/Rate Route Frequency Ordered Stop   01/16/22 1000  ceFEPIme (MAXIPIME) 2 g in sodium chloride 0.9 % 100 mL IVPB        2 g 200 mL/hr over 30 Minutes Intravenous Every 24 hours 01/15/22 1014     01/15/22 1015  ceFEPIme (MAXIPIME) 2 g in sodium chloride 0.9 % 100 mL IVPB        2 g 200 mL/hr over 30 Minutes Intravenous  Once 01/15/22 1005 01/15/22 1127   01/15/22 1015  metroNIDAZOLE (FLAGYL) IVPB 500 mg        500 mg 100 mL/hr over 60 Minutes Intravenous  Once 01/15/22 1005 01/15/22 1213   01/15/22 1015  vancomycin (VANCOCIN) IVPB 1000 mg/200 mL premix  Status:  Discontinued        1,000 mg 200 mL/hr over 60 Minutes Intravenous  Once 01/15/22 1005 01/15/22 1013   01/15/22 1015  vancomycin (VANCOREADY) IVPB 2000 mg/400 mL        2,000 mg 200 mL/hr over 120 Minutes Intravenous  Once 01/15/22 1013 01/15/22 1325   01/15/22 1011  vancomycin variable dose per unstable renal function (pharmacist dosing)         Does not apply See admin instructions 01/15/22 1011         Subjective: C/o knee and hand pain  Objective: Vitals:   01/16/22 1500 01/16/22 1640 01/16/22 1656 01/16/22 1700  BP:  132/67    Pulse: 74 75 76 75  Resp: (!) 22 17 (!) 21 (!) 21  Temp:  97.7 F (36.5 C)    TempSrc:  Oral    SpO2: 98% 99% 98% 98%  Weight:      Height:        Intake/Output Summary (Last 24 hours) at 01/16/2022 1704 Last data filed at 01/16/2022 1300 Gross per 24 hour  Intake 100 ml  Output 0 ml  Net 100 ml    Filed Weights   01/15/22 0900  Weight: 116.3 kg    Examination:  General exam: Appears calm and comfortable  Respiratory system: unlabored Cardiovascular system: RRR Gastrointestinal system: Abdomen is nondistended, soft and nontender Central nervous system: Alert and oriented. LUE weakness, (? Related to pain), able to move LE's, but limited (LLE due to pain) Extremities: L knee swelling, dressing in place - limited ROM - TTP of L thumb and first finger, swelling of L hand    Data Reviewed: I have personally reviewed following labs and imaging studies  CBC: Recent Labs  Lab 01/15/22 0928 01/15/22 1017 01/15/22 1028 01/16/22 0410  WBC 13.3*  --  14.5* 15.1*  NEUTROABS  --   --  11.4*  --  HGB 9.3* 9.9* 9.3* 7.8*  HCT 29.8* 29.0* 29.4* 25.2*  MCV 93.1  --  91.9 93.3  PLT 174  --  192 086    Basic Metabolic Panel: Recent Labs  Lab 01/15/22 0928 01/15/22 1017 01/15/22 1323 01/16/22 0410  NA 139 138  --  135  K 3.7 3.6  --  5.3*  CL 103 101  --  98  CO2 22  --   --  21*  GLUCOSE 104* 101*  --  253*  BUN 102* 108*  --  116*  CREATININE 5.68* 6.30*  --  6.85*  CALCIUM 7.6*  --   --  7.9*  MG  --   --  1.9  --   PHOS  --   --  4.9*  --     GFR: Estimated Creatinine Clearance: 13.4 mL/min (Xzavier Swinger) (by C-G formula based on SCr of 6.85 mg/dL (H)).  Liver Function Tests: Recent Labs  Lab 01/15/22 0928  AST 80*  ALT 24  ALKPHOS 123  BILITOT 1.0  PROT 6.1*  ALBUMIN 1.7*    CBG: Recent Labs  Lab 01/15/22 1646 01/16/22 0119 01/16/22 0805 01/16/22 1121 01/16/22 1643  GLUCAP 192* 246* 246* 227* 265*     Recent Results (from the past 240 hour(s))  Blood Culture (routine x 2)     Status: None (Preliminary result)   Collection Time: 01/15/22 10:04 AM   Specimen: BLOOD RIGHT FOREARM  Result Value Ref Range Status   Specimen Description BLOOD RIGHT FOREARM  Final   Special Requests   Final    BOTTLES DRAWN AEROBIC AND ANAEROBIC Blood Culture adequate  volume   Culture   Final    NO GROWTH < 24 HOURS Performed at [facility], [place] [address], [place], [place] [address]    Report Status PENDING  Incomplete  Resp Panel by RT-PCR (Flu Lynel Forester&B, Covid) Anterior Nasal Swab     Status: None   Collection Time: 01/15/22 10:05 AM   Specimen: Anterior Nasal Swab  Result Value Ref Range Status   SARS Coronavirus 2 by RT PCR NEGATIVE NEGATIVE Final    Comment: (NOTE) SARS-CoV-2 target nucleic acids are NOT DETECTED.  The SARS-CoV-2 RNA is generally detectable in upper respiratory specimens during the acute phase of infection. The lowest concentration of SARS-CoV-2 viral copies this assay can detect is 138 copies/mL. Brianny Soulliere negative result does not preclude SARS-Cov-2 infection and should not be used as the sole basis for treatment or other patient management decisions. Arav Bannister negative result may occur with  improper specimen collection/handling, submission of specimen other than nasopharyngeal swab, presence of viral mutation(s) within the areas targeted by this assay, and inadequate number of viral copies(<138 copies/mL). Woodfin Kiss negative result must be combined with clinical observations, patient history, and epidemiological information. The expected result is Negative.  Fact Sheet for Patients:  EntrepreneurPulse.com.au  Fact Sheet for Healthcare Providers:  IncredibleEmployment.be  This test is no t yet approved or cleared by the [place] FDA and  has been authorized for detection and/or diagnosis of SARS-CoV-2 by FDA under an Emergency Use Authorization (EUA). This EUA will remain  in effect (meaning this test can be used) for the duration of the COVID-19 declaration under Section 564(b)(1) of the Act, 21 U.S.C.section 360bbb-3(b)(1), unless the authorization is terminated  or revoked sooner.       Influenza Estrella Alcaraz by PCR NEGATIVE NEGATIVE Final   Influenza B by PCR NEGATIVE NEGATIVE Final    Comment:  (  NOTE) The Xpert Xpress SARS-CoV-2/FLU/RSV plus assay is intended as an aid in the diagnosis of influenza from Nasopharyngeal swab specimens and should not be used as Danyiel Crespin sole basis for treatment. Nasal washings and aspirates are unacceptable for Xpert Xpress SARS-CoV-2/FLU/RSV testing.  Fact Sheet for Patients: EntrepreneurPulse.com.au  Fact Sheet for Healthcare Providers: IncredibleEmployment.be  This test is not yet approved or cleared by the [place] FDA and has been authorized for detection and/or diagnosis of SARS-CoV-2 by FDA under an Emergency Use Authorization (EUA). This EUA will remain in effect (meaning this test can be used) for the duration of the COVID-19 declaration under Section 564(b)(1) of the Act, 21 U.S.C. section 360bbb-3(b)(1), unless the authorization is terminated or revoked.  Performed at [facility], [place] [address] [place], [place] [address]   Blood Culture (routine x 2)     Status: None (Preliminary result)   Collection Time: 01/15/22 10:09 AM   Specimen: BLOOD  Result Value Ref Range Status   Specimen Description BLOOD SITE NOT SPECIFIED  Final   Special Requests   Final    BOTTLES DRAWN AEROBIC AND ANAEROBIC Blood Culture adequate volume   Culture   Final    NO GROWTH < 24 HOURS Performed at [facility], [place] [address], [place], [place] [address]    Report Status PENDING  Incomplete  MRSA Next Gen by PCR, Nasal     Status: None   Collection Time: 01/15/22  1:28 PM   Specimen: Nasal Mucosa; Nasal Swab  Result Value Ref Range Status   MRSA by PCR Next Gen NOT DETECTED NOT DETECTED Final    Comment: (NOTE) The GeneXpert MRSA Assay (FDA approved for NASAL specimens only), is one component of Ryaan Vanwagoner comprehensive MRSA colonization surveillance program. It is not intended to diagnose MRSA infection nor to guide or monitor treatment for MRSA infections. Test performance is not FDA approved in patients  less than 60 years old. Performed at [facility], [place] [address] [place], [place] [address]   Body fluid culture w Gram Stain     Status: None (Preliminary result)   Collection Time: 01/15/22  5:59 PM   Specimen: Synovial Fluid  Result Value Ref Range Status   Specimen Description FLUID SYNOVIAL KNEE LEFT  Final   Special Requests NONE  Final   Gram Stain   Final    ABUNDANT WBC PRESENT, PREDOMINANTLY PMN NO ORGANISMS SEEN    Culture   Final    NO GROWTH < 24 HOURS Performed at [facility], [place] [address], [place], [place] [address]    Report Status PENDING  Incomplete  Anaerobic culture w Gram Stain     Status: None (Preliminary result)   Collection Time: 01/15/22  5:59 PM   Specimen: Synovial Fluid  Result Value Ref Range Status   Specimen Description FLUID SYNOVIAL KNEE LEFT  Final   Special Requests NONE  Final   Gram Stain   Final    ABUNDANT WBC PRESENT, PREDOMINANTLY PMN NO ORGANISMS SEEN Performed at [facility], [place] [address] [place],  [address]    Culture PENDING  Incomplete   Report Status PENDING  Incomplete  Body fluid culture w Gram Stain     Status: None (Preliminary result)   Collection Time: 01/16/22 11:48 AM   Specimen: Synovium; Body Fluid  Result Value Ref Range Status   Specimen Description SYNOVIAL  Final   Special Requests  RIGHT KNEE  Final  Gram Stain   Final    ABUNDANT WBC PRESENT, PREDOMINANTLY PMN NO ORGANISMS SEEN Performed at [facility], [place] [address] [place], [place] [address]    Culture PENDING  Incomplete   Report Status PENDING  Incomplete         Radiology Studies: MR BRAIN WO CONTRAST  Result Date: 01/16/2022 CLINICAL DATA:  Neuro deficit, acute, stroke suspected. EXAM: MRI HEAD WITHOUT CONTRAST TECHNIQUE: Multiplanar, multiecho pulse sequences of the brain and surrounding structures were obtained without intravenous contrast. COMPARISON:  01/15/2022 CT FINDINGS: Brain: Diffusion  imaging does not show any acute or subacute infarction or other cause of restricted diffusion. There is generalized brain atrophy, advanced for age. Old small vessel infarctions affect the central pons. Few old small vessel cerebellar infarctions. Cerebral hemispheres show old infarctions of the left basal ganglia and radiating white matter tracts. Mild chronic small-vessel ischemic change seen elsewhere. No large vessel territory infarction. No mass, hemorrhage, hydrocephalus or extra-axial collection. Vascular: Major vessels at the base of the brain show flow. Skull and upper cervical spine: Negative Sinuses/Orbits: Clear/normal Other: None IMPRESSION: No acute MR finding. Accelerated generalized atrophy. 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 Electronically Signed   By: Nelson Chimes M.D.   On: 01/16/2022 16:12   DG Hand 2 View Left  Result Date: 01/16/2022 CLINICAL DATA:  Pain and swelling after falling from bed. EXAM: LEFT HAND - 2 VIEW COMPARISON:  None Available. FINDINGS: Generalized regional swelling. No evidence fracture or dislocation. Small erosions are noted at several of the inter phalangeal joints which could be seen with erosive arthritis including erosive osteoarthritis. IMPRESSION: No acute finding other than soft tissue swelling. 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 Electronically Signed   By: Nelson Chimes M.D.   On: 01/16/2022 15:07   DG Chest 1 View  Result Date: 01/16/2022 CLINICAL DATA:  Sepsis. EXAM: CHEST  1 VIEW COMPARISON:  01/15/2022 FINDINGS: 0505 hours. The cardio pericardial silhouette is enlarged. Low volume film. Bibasilar atelectasis noted. There is pulmonary vascular congestion without overt pulmonary edema. Telemetry leads overlie the chest. IMPRESSION: Enlarged cardiopericardial silhouette with  vascular congestion. Electronically Signed   By: Misty Stanley M.D.   On: 01/16/2022 06:00   DG Knee Complete 4 Views Right  Result Date: 01/15/2022 CLINICAL DATA:  Fall, pain and swelling, initial encounter. EXAM: RIGHT KNEE - COMPLETE 4+ VIEW COMPARISON:  None Available. FINDINGS: Large joint effusion. No fracture. Slight varus angulation of the knee joint with minimal lateral subluxation of the proximal tibia with respect to the distal femur. Marked loss of joint space in the medial compartment with subchondral sclerosis and osteophytosis. Additional osteophytosis in the medial and patellofemoral compartments. IMPRESSION: 1. Moderate to large joint effusion.  No fracture. 2. 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 Electronically Signed   By: Lorin Picket M.D.   On: 01/15/2022 13:53   CT Head Wo Contrast  Result Date: 01/15/2022 CLINICAL DATA:  Fall off Nevea Spiewak bed on Friday. EXAM: CT HEAD WITHOUT CONTRAST TECHNIQUE: Contiguous axial images were obtained from the base of the skull through the vertex without intravenous contrast. RADIATION DOSE REDUCTION: This exam was performed according to the departmental dose-optimization program which includes automated exposure control, adjustment of the mA and/or kV according to patient size and/or use of iterative reconstruction technique. COMPARISON:  None Available. FINDINGS: Brain: There is  no acute intracranial hemorrhage, extra-axial fluid collection, or acute infarct. There is mild volume loss primarily affecting the frontal lobes. The ventricles are normal in size. Gray-white differentiation is preserved. There is no mass lesion.  There is no mass effect or midline shift. Vascular: There is calcification of the bilateral cavernous ICAs. Skull: Normal. Negative for fracture or focal lesion. Sinuses/Orbits: The paranasal sinuses are clear. The globes and orbits are unremarkable. Other: None. IMPRESSION: No acute intracranial pathology.  Electronically Signed   By: Valetta Mole M.D.   On: 01/15/2022 12:30   DG Chest Port 1 View  Result Date: 01/15/2022 CLINICAL DATA:  Questionable sepsis. EXAM: PORTABLE CHEST 1 VIEW COMPARISON:  Chest radiograph dated April 20, 2008 FINDINGS: The heart is mildly enlarged. Chronic elevation of the right hemidiaphragm. Lungs are otherwise clear without evidence of focal consolidation or large pleural effusion. IMPRESSION: 1.  Stable mild cardiomegaly. 2. Chronic elevation of the right hemidiaphragm. No evidence of pneumonia or pleural effusion. Electronically Signed   By: Keane Police D.O.   On: 01/15/2022 10:54        Scheduled Meds:  abiraterone acetate  1,000 mg Oral Daily   aspirin EC  81 mg Oral Daily   atorvastatin  80 mg Oral Daily   calcitRIOL  0.25 mcg Oral Daily   heparin  5,000 Units Subcutaneous Q12H   insulin aspart  0-9 Units Subcutaneous TID WC   insulin detemir  20 Units Subcutaneous BID   methylPREDNISolone (SOLU-MEDROL) injection  40 mg Intravenous Q1200   pantoprazole (PROTONIX) IV  40 mg Intravenous Q12H   sodium chloride flush  3 mL Intravenous Q12H   sodium chloride flush  3 mL Intravenous Q12H   sodium zirconium cyclosilicate  10 g Oral Daily   vancomycin variable dose per unstable renal function (pharmacist dosing)   Does not apply See admin instructions   Continuous Infusions:  sodium chloride     sodium chloride     ceFEPime (MAXIPIME) IV Stopped (01/16/22 1300)     LOS: 1 day    Time spent: over 30 min    Fayrene Helper, MD [facility]   To contact the attending provider between 7A-7P or the covering provider during after hours 7P-7A, please log into the web site [email or web address] and access using universal [place] password for that web site. If you do not have the password, please call the hospital operator.  01/16/2022, 5:04 PM

## 2022-01-16 NOTE — Inpatient Diabetes Management (Addendum)
Inpatient Diabetes Program Recommendations  AACE/ADA: New Consensus Statement on Inpatient Glycemic Control (2015)  Target Ranges:  Prepandial:   less than 140 mg/dL      Peak postprandial:   less than 180 mg/dL (1-2 hours)      Critically ill patients:  140 - 180 mg/dL   Lab Results  Component Value Date   GLUCAP 246 (H) 01/16/2022   HGBA1C 9.6 (H) 01/15/2022    Review of Glycemic Control  Latest Reference Range & Units 01/15/22 09:04 01/15/22 16:46 01/16/22 01:19 01/16/22 08:05  Glucose-Capillary 70 - 99 mg/dL 94 192 (H) 246 (H) 246 (H)   Large knee effusion Diabetes history: DM 2 Outpatient Diabetes medications: Glipizide 10 mg bid, Lispro 15 units tid, Levemir 40 units bid Current orders for Inpatient glycemic control:  Novolog 0-9 units tid  Cortef 20 mg Q6  Inpatient Diabetes Program Recommendations:    -   Consider adding Levemir 8 units  Addendum 4462 am: Spoke with pt at bedside regarding A1c of 9.6% and glucose control at home. Pt reports challenges to glucose control at home. Pt described being on chemo treatments and the use of steroids during these times. Pt reports good follow up with his doctors. Pts next appt is in early September. Encouraged glucose control at home and encouraged calling PCP prior to appts if glucose control is not at goal.  Thanks,  Tama Headings RN, MSN, BC-ADM Inpatient Diabetes Coordinator Team Pager [phone] (8a-5p)

## 2022-01-16 NOTE — Progress Notes (Signed)
PT Cancellation Note  Patient Details Name: [name] MRN: [id number] DOB: 04/06/1956   Cancelled Treatment:    Reason Eval/Treat Not Completed: Medical issues which prohibited therapy Per MD, requesting to hold until after OR. Will follow up as schedule allows.   Lou Miner, DPT  Acute Rehabilitation Services  Office: [phone]    Rudean Hitt 01/16/2022, 10:01 AM

## 2022-01-16 NOTE — Assessment & Plan Note (Signed)
Transitioned to IV steroids, he mentions related to prostate cancer?

## 2022-01-16 NOTE — Assessment & Plan Note (Signed)
With encephalopathy, will follow MRI  Related to pain?

## 2022-01-16 NOTE — Assessment & Plan Note (Signed)
Synovial fluid from knee appears c/w gout flare Will continue abx for now given temperature and white count at presentation Follow cultures, maybe able to deescalate abx

## 2022-01-16 NOTE — Assessment & Plan Note (Signed)
Follow echo for enlarged cardiopericardial sihouette

## 2022-01-16 NOTE — Procedures (Signed)
Procedure: Right knee aspiration and injection   Indication: Right knee effusion(s)   Surgeon: Silvestre Gunner, PA-C   Assist: None   Anesthesia: Topical refrigerant   EBL: None   Complications: None   Findings: After risks/benefits explained patient desires to undergo procedure. Consent obtained and time out performed. The right knee was sterilely prepped and aspirated. 33m dark yellow blood-tinged fluid obtained. 691m0.5% Marcaine instilled. Pt tolerated the procedure well.       MiLisette AbuPA-C Orthopedic Surgery [phone]

## 2022-01-16 NOTE — Progress Notes (Signed)
Heart Failure Navigator Progress Note  Assessed for Heart & Vascular TOC clinic readiness.  Patient does not meet criteria due to hypovolemia, creat 6.85.     Earnestine Leys, BSN, Clinical cytogeneticist Only

## 2022-01-16 NOTE — Assessment & Plan Note (Signed)
Holding coreg, hydralazine with borderline BP's at presentation

## 2022-01-16 NOTE — Progress Notes (Signed)
Pt in MRI- unavailable for c/s Briefly, 43M with CKD V and AKI in the setting of septic arthritis Question of progressive symptoms and possible need for dialysis start this admission Full c/s to follow Lokelma given for mild hyperK  Madelon Lips MD [facility] [phone]

## 2022-01-16 NOTE — Hospital Course (Signed)
[name] is [name] 66 y.o. male with medical history significant of CKD stage V, IDDM with insulin resistance, cardiomyopathy with chronic HFpEF, HTN, HLD, stage III prostate cancer on hormone manipulation and prednisone therapy, brought in by family member for confusion and hypoglycemia.   Patient underwent left arm AV fistula surgery on 01/09/22.  Friday morning, patient sustained [name] fall and hit his left knee, when his sugar was " very low".  Since then the patient has been much bedbound due to the severe left knee pain and he continued to take oxycodone 1-2 times [name] day for the pain.  He also takes 2 times [name] day Tylenol for the chronic knee pain.   Over the weekend, patient found to have less oral intake.   At baseline, patient does have chronic leg pain mainly left knee and left ankle.  For stage III prostate cancer, patient has been following with oncologist at [facility], has been on prednisone and androgen antagonist for 3+ month.    Has [name] CKD stage V, follows with [facility], was recently offered AV fistula " to get ready for future HD" he does have [name] chronic elevation of BUN.  He was noted to have temp 100.3, elevated WBC concerning for sepsis.  L knee swelling concerning for septic arthritis.  Now s/p aspiration, appears consistent with gout.  On IV steroids.  Renal consulted for worsening AKI and concern for possible uremeia.  Orthopedics following for L knee.  Vascular consulted with regards to L hand swelling and recent AV fistula surgery.

## 2022-01-16 NOTE — Assessment & Plan Note (Signed)
L knee synovial fluid turbid, 23,835 WBC (96% neutrophils), monosodium urate crystals Gram stain without organisms Follow culture Follow on steroids abx being continued for now, consider deescalation

## 2022-01-17 ENCOUNTER — Inpatient Hospital Stay (HOSPITAL_COMMUNITY): Payer: Medicare Other

## 2022-01-17 DIAGNOSIS — R918 Other nonspecific abnormal finding of lung field: Secondary | ICD-10-CM

## 2022-01-17 DIAGNOSIS — G9341 Metabolic encephalopathy: Secondary | ICD-10-CM

## 2022-01-17 DIAGNOSIS — M109 Gout, unspecified: Secondary | ICD-10-CM

## 2022-01-17 DIAGNOSIS — G9340 Encephalopathy, unspecified: Secondary | ICD-10-CM | POA: Diagnosis not present

## 2022-01-17 DIAGNOSIS — I10 Essential (primary) hypertension: Secondary | ICD-10-CM | POA: Diagnosis not present

## 2022-01-17 DIAGNOSIS — A419 Sepsis, unspecified organism: Secondary | ICD-10-CM | POA: Diagnosis not present

## 2022-01-17 DIAGNOSIS — R4182 Altered mental status, unspecified: Secondary | ICD-10-CM | POA: Diagnosis not present

## 2022-01-17 LAB — COMPREHENSIVE METABOLIC PANEL
ALT: 123 U/L — ABNORMAL HIGH (ref 0–44)
AST: 510 U/L — ABNORMAL HIGH (ref 15–41)
Albumin: 1.7 g/dL — ABNORMAL LOW (ref 3.5–5.0)
Alkaline Phosphatase: 133 U/L — ABNORMAL HIGH (ref 38–126)
Anion gap: 19 — ABNORMAL HIGH (ref 5–15)
BUN: 145 mg/dL — ABNORMAL HIGH (ref 8–23)
CO2: 18 mmol/L — ABNORMAL LOW (ref 22–32)
Calcium: 7.4 mg/dL — ABNORMAL LOW (ref 8.9–10.3)
Chloride: 95 mmol/L — ABNORMAL LOW (ref 98–111)
Creatinine, Ser: 8.49 mg/dL — ABNORMAL HIGH (ref 0.61–1.24)
GFR, Estimated: 6 mL/min — ABNORMAL LOW (ref >60)
Glucose, Bld: 367 mg/dL — ABNORMAL HIGH (ref 70–99)
Potassium: 5.8 mmol/L — ABNORMAL HIGH (ref 3.5–5.1)
Sodium: 132 mmol/L — ABNORMAL LOW (ref 135–145)
Total Bilirubin: 1.3 mg/dL — ABNORMAL HIGH (ref 0.3–1.2)
Total Protein: 6.4 g/dL — ABNORMAL LOW (ref 6.5–8.1)

## 2022-01-17 LAB — RENAL FUNCTION PANEL
Albumin: 1.6 g/dL — ABNORMAL LOW (ref 3.5–5.0)
Anion gap: 20 — ABNORMAL HIGH (ref 5–15)
BUN: 149 mg/dL — ABNORMAL HIGH (ref 8–23)
CO2: 16 mmol/L — ABNORMAL LOW (ref 22–32)
Calcium: 7.5 mg/dL — ABNORMAL LOW (ref 8.9–10.3)
Chloride: 93 mmol/L — ABNORMAL LOW (ref 98–111)
Creatinine, Ser: 8.64 mg/dL — ABNORMAL HIGH (ref 0.61–1.24)
GFR, Estimated: 6 mL/min — ABNORMAL LOW (ref >60)
Glucose, Bld: 360 mg/dL — ABNORMAL HIGH (ref 70–99)
Phosphorus: 9.7 mg/dL — ABNORMAL HIGH (ref 2.5–4.6)
Potassium: 5.4 mmol/L — ABNORMAL HIGH (ref 3.5–5.1)
Sodium: 129 mmol/L — ABNORMAL LOW (ref 135–145)

## 2022-01-17 LAB — CBC WITH DIFFERENTIAL/PLATELET
Abs Immature Granulocytes: 0.1 10*3/uL — ABNORMAL HIGH (ref 0.00–0.07)
Basophils Absolute: 0 10*3/uL (ref 0.0–0.1)
Basophils Relative: 0 %
Eosinophils Absolute: 0 10*3/uL (ref 0.0–0.5)
Eosinophils Relative: 0 %
HCT: 25.9 % — ABNORMAL LOW (ref 39.0–52.0)
Hemoglobin: 8.1 g/dL — ABNORMAL LOW (ref 13.0–17.0)
Immature Granulocytes: 1 %
Lymphocytes Relative: 4 %
Lymphs Abs: 0.5 10*3/uL — ABNORMAL LOW (ref 0.7–4.0)
MCH: 29 pg (ref 26.0–34.0)
MCHC: 31.3 g/dL (ref 30.0–36.0)
MCV: 92.8 fL (ref 80.0–100.0)
Monocytes Absolute: 0.7 10*3/uL (ref 0.1–1.0)
Monocytes Relative: 4 %
Neutro Abs: 14 10*3/uL — ABNORMAL HIGH (ref 1.7–7.7)
Neutrophils Relative %: 91 %
Platelets: 163 10*3/uL (ref 150–400)
RBC: 2.79 MIL/uL — ABNORMAL LOW (ref 4.22–5.81)
RDW: 14.4 % (ref 11.5–15.5)
WBC: 15.3 10*3/uL — ABNORMAL HIGH (ref 4.0–10.5)
nRBC: 0 % (ref 0.0–0.2)

## 2022-01-17 LAB — ECHOCARDIOGRAM COMPLETE
AR max vel: 2.99 cm2
AV Peak grad: 8.9 mmHg
Ao pk vel: 1.49 m/s
Area-P 1/2: 7.09 cm2
Height: 71 in
S' Lateral: 3.7 cm
Weight: 4366.87 oz

## 2022-01-17 LAB — GLUCOSE, CAPILLARY
Glucose-Capillary: 345 mg/dL — ABNORMAL HIGH (ref 70–99)
Glucose-Capillary: 334 mg/dL — ABNORMAL HIGH (ref 70–99)
Glucose-Capillary: 319 mg/dL — ABNORMAL HIGH (ref 70–99)
Glucose-Capillary: 337 mg/dL — ABNORMAL HIGH (ref 70–99)

## 2022-01-17 LAB — HEPATITIS B CORE ANTIBODY, TOTAL: Hep B Core Total Ab: NONREACTIVE

## 2022-01-17 LAB — HEPATITIS B SURFACE ANTIBODY,QUALITATIVE: Hep B S Ab: NONREACTIVE

## 2022-01-17 LAB — VANCOMYCIN, RANDOM: Vancomycin Rm: 22 ug/mL

## 2022-01-17 LAB — HEPATITIS B SURFACE ANTIGEN: Hepatitis B Surface Ag: NONREACTIVE

## 2022-01-17 LAB — PHOSPHORUS: Phosphorus: 9.7 mg/dL — ABNORMAL HIGH (ref 2.5–4.6)

## 2022-01-17 LAB — PROCALCITONIN: Procalcitonin: 33.95 ng/mL

## 2022-01-17 LAB — HEPATITIS C ANTIBODY: HCV Ab: NONREACTIVE

## 2022-01-17 LAB — MAGNESIUM: Magnesium: 2.4 mg/dL (ref 1.7–2.4)

## 2022-01-17 MED ORDER — SODIUM ZIRCONIUM CYCLOSILICATE 10 G PO PACK
10.0000 g | PACK | Freq: Three times a day (TID) | ORAL | Status: DC
  Administered 2022-01-17 – 2022-01-18 (×6): 10 g via ORAL
  Filled 2022-01-17 (×7): qty 1

## 2022-01-17 MED ORDER — CALCIUM GLUCONATE-NACL 1-0.675 GM/50ML-% IV SOLN
1.0000 g | Freq: Once | INTRAVENOUS | Status: AC
  Administered 2022-01-17: 1000 mg via INTRAVENOUS
  Filled 2022-01-17: qty 50

## 2022-01-17 MED ORDER — CHLORHEXIDINE GLUCONATE CLOTH 2 % EX PADS
6.0000 | MEDICATED_PAD | Freq: Every day | CUTANEOUS | Status: DC
  Administered 2022-01-18 – 2022-02-01 (×15): 6 via TOPICAL

## 2022-01-17 MED ORDER — LIDOCAINE HCL (PF) 1 % IJ SOLN: 5.0000 mL | INTRAMUSCULAR | Status: DC | PRN

## 2022-01-17 MED ORDER — PERFLUTREN LIPID MICROSPHERE
1.0000 mL | INTRAVENOUS | Status: AC | PRN
  Administered 2022-01-17: 2 mL via INTRAVENOUS

## 2022-01-17 MED ORDER — INSULIN ASPART 100 UNIT/ML IJ SOLN
4.0000 [IU] | Freq: Three times a day (TID) | INTRAMUSCULAR | Status: DC
  Administered 2022-01-17 – 2022-01-18 (×2): 4 [IU] via SUBCUTANEOUS

## 2022-01-17 MED ORDER — INSULIN DETEMIR 100 UNIT/ML ~~LOC~~ SOLN
25.0000 [IU] | Freq: Two times a day (BID) | SUBCUTANEOUS | Status: DC
  Administered 2022-01-17 – 2022-01-18 (×2): 25 [IU] via SUBCUTANEOUS
  Filled 2022-01-17 (×4): qty 0.25

## 2022-01-17 MED ORDER — PENTAFLUOROPROP-TETRAFLUOROETH EX AERO: 1.0000 | INHALATION_SPRAY | CUTANEOUS | Status: DC | PRN

## 2022-01-17 MED ORDER — LIDOCAINE-PRILOCAINE 2.5-2.5 % EX CREA: 1.0000 | TOPICAL_CREAM | CUTANEOUS | Status: DC | PRN

## 2022-01-17 NOTE — Progress Notes (Signed)
TR[facility] night cross cover note:  Potassium this morning is 5.8, up slightly from 5.3 yesterday, with this morning's labs also reflecting further increase in serum creatinine along with persistent AGMA.  Nephrology has already been consulted, and actively following, including for determination of timing of initiation of hemodialysis. Continue scheduled Lokelma, with next dose to occur now.  Adjusted calcium level this morning 9.2.  Magnesium level this morning 2.4. I also ordered calcium gluconate 1 g IV over 1 hour x 1 dose now.  Repeat EKG now.  I also ordered repeat renal function panel to be checked at 10 AM this morning.     Babs Bertin, DO Hospitalist

## 2022-01-17 NOTE — Progress Notes (Signed)
PROGRESS NOTE        PATIENT DETAILS Name: [name] Age: 66 y.o. Sex: male Date of Birth: [birth year] Admit Date: 01/15/2022 Admitting Physician Lequita Halt, MD QIW:LNLGX, Malva Limes., FNP  Brief Summary: Patient is a 66 y.o.  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  See below for further details.   Significant events: 8/14>> admit for confusion/gouty arthritis/progression to ESRD. 8/16>> worsening transaminases-renal planning HD.  Significant studies: 8/15>> MRI brain: No acute CVA. 8/15>> renal ultrasound: No hydronephrosis. 8/14>> left knee synovial fluid: WBC 23,835, extracellular sodium urate crystals. 8/15>> right knee synovial fluid: WBC 15,600, intracellular monosodium urate crystals. 8/16>> Echo: EF 55-60%,  Significant microbiology data: 8/14>> blood culture: No growth 8/14>> left knee synovial fluid: No growth 8/15>> right knee synovial fluid: No growth  Procedures: 8/14>> left knee arthrocentesis 8/15>> right knee arthrocentesis  Consults: Nephrology. Orthopedics  Subjective: Lying comfortably in bed-denies any chest pain or shortness of breath.  Objective: Vitals: Blood pressure 134/75, pulse 74, temperature (!) 97.5 F (36.4 C), temperature source Oral, resp. rate 20, height '5\' 11"'$  (1.803 m), weight 123.8 kg, SpO2 97 %.   Exam: Gen Exam: Slightly slow-but otherwise not in any distress. HEENT:atraumatic, normocephalic Chest: B/L clear to auscultation anteriorly CVS:S1S2 regular Abdomen:soft non tender, non distended Extremities:no edema-mild tenderness in bilateral knees. Neurology: Nonfocal exam-with aches of some mild weakness in  grip of his left hand.   Skin: no rash  Pertinent Labs/Radiology:    Latest Ref Rng &  Units 01/17/2022    4:41 AM 01/16/2022    5:58 PM 01/16/2022    4:10 AM  CBC  WBC 4.0 - 10.5 K/uL 15.3   15.1   Hemoglobin 13.0 - 17.0 g/dL 8.1  8.3  7.8   Hematocrit 39.0 - 52.0 % 25.9  25.9  25.2   Platelets 150 - 400 K/uL 163   169     Lab Results  Component Value Date   NA 129 (L) 01/17/2022   K 5.4 (H) 01/17/2022   CL 93 (L) 01/17/2022   CO2 16 (L) 01/17/2022      Assessment/Plan: SIRS due to acute gouty arthritis involving bilateral knees, numerous joints of left hand: Bilateral knee arthralgias/left hand arthralgias better-synovial fluid cultures negative so far-on IV steroids-suspect we could discontinue antibiotics and watch closely.  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  Hyperkalemia: Due to worsening renal function-continue Lokelma-should improve once HD started.  Acute metabolic encephalopathy: Due to uremia-hopeful that once HD status-mentation will improve further.  Per patient's son at bedside on 8/16-mentation better than what it was a few days back.  Normocytic anemia: Due to acute illness/CKD-although FOBT positive-no evidence of overt blood loss.  Defer Aranesp/iron therapy to nephrology service.  Transaminitis: Unclear etiology-suspect probably medication related.  Abdominal exam is benign without any RUQ tenderness.  Statin stopped earlier this am-stop cefepime-awaiting RUQ ultrasound.  Continue to trend LFTs-if worsens-we will send out viral serologies and other work-up.    Hyponatremia: Related to volume-should improve post HD.  Hypoglycemia: Likely related to use of oral hypoglycemic agents in the setting of worsening renal function.  Continue to watch closely.  DM-2 (A1c 9.6 on 8/14) with uncontrolled hyperglycemia due to steroids: Increase Levemir to 25 units twice daily, add 4 units of NovoLog with meals.  Will need to permanently stop glipizide and metformin on discharge.  Recent Labs    01/16/22 2111  01/17/22 0757 01/17/22 1232  GLUCAP 296* 345* 334*    Chronic left arm weakness: Per patient's son-this has been ongoing for the past several months-son has noted a problem with the patient's grip mostly.  MRI brain negative for acute CVA.  Check MRI C-spine.  Suspect some of it may be due to acute gouty arthritis involving his small hand joints as well.  Prostate cancer: On prednisone/Zytiga-currently on IV Solu-Medrol for gouty flare-Zytiga will remain on hold due to elevated liver function enzymes  Morbid Obesity: Estimated body mass index is 38.07 kg/m as calculated from the following:   Height as of this encounter: '5\' 11"'$  (1.803 m).   Weight as of this encounter: 123.8 kg.   Code status:   Code Status: Full Code   DVT Prophylaxis: heparin injection 5,000 Units Start: 01/15/22 1330   Family Communication: Son at bedside   Disposition Plan: Status is: Inpatient Remains inpatient appropriate because: Likely progression to ESRD-nephrology initiating dialysis-not yet stable for discharge.   Planned Discharge Destination:Home   Diet: Diet Order             Diet NPO time specified  Diet effective now                     Antimicrobial agents: Anti-infectives (From admission, onward)    Start     Dose/Rate Route Frequency Ordered Stop   01/16/22 1000  ceFEPIme (MAXIPIME) 2 g in sodium chloride 0.9 % 100 mL IVPB        2 g 200 mL/hr over 30 Minutes Intravenous Every 24 hours 01/15/22 1014     01/15/22 1015  ceFEPIme (MAXIPIME) 2 g in sodium chloride 0.9 % 100 mL IVPB        2 g 200 mL/hr over 30 Minutes Intravenous  Once 01/15/22 1005 01/15/22 1127   01/15/22 1015  metroNIDAZOLE (FLAGYL) IVPB 500 mg        500 mg 100 mL/hr over 60 Minutes Intravenous  Once 01/15/22 1005 01/15/22 1213   01/15/22 1015  vancomycin (VANCOCIN) IVPB 1000 mg/200 mL premix  Status:  Discontinued        1,000 mg 200 mL/hr over 60 Minutes Intravenous  Once 01/15/22 1005 01/15/22 1013    01/15/22 1015  vancomycin (VANCOREADY) IVPB 2000 mg/400 mL        2,000 mg 200 mL/hr over 120 Minutes Intravenous  Once 01/15/22 1013 01/15/22 1325   01/15/22 1011  vancomycin variable dose per unstable renal function (pharmacist dosing)         Does not apply See admin instructions 01/15/22 1011          MEDICATIONS: Scheduled Meds:  aspirin EC  81 mg Oral Daily   calcitRIOL  0.25 mcg Oral Daily   [START ON 01/18/2022] Chlorhexidine Gluconate Cloth  6 each Topical Q0600   heparin  5,000 Units Subcutaneous Q12H   insulin aspart  0-9 Units Subcutaneous TID WC   insulin detemir  20 Units Subcutaneous BID   methylPREDNISolone (SOLU-MEDROL) injection  40 mg Intravenous Q1200   pantoprazole (PROTONIX) IV  40 mg Intravenous Q12H   sodium chloride flush  3 mL Intravenous Q12H   sodium chloride  flush  3 mL Intravenous Q12H   sodium zirconium cyclosilicate  10 g Oral TID   vancomycin variable dose per unstable renal function (pharmacist dosing)   Does not apply See admin instructions   Continuous Infusions:  sodium chloride     sodium chloride     ceFEPime (MAXIPIME) IV 2 g (01/17/22 1053)   PRN Meds:.sodium chloride, sodium chloride, acetaminophen, hydrALAZINE, HYDROmorphone (DILAUDID) injection, lidocaine (PF), lidocaine-prilocaine, ondansetron (ZOFRAN) IV, pentafluoroprop-tetrafluoroeth, sodium chloride flush, sodium chloride flush   I have personally reviewed following labs and imaging studies  LABORATORY DATA: CBC: Recent Labs  Lab 01/15/22 0928 01/15/22 1017 01/15/22 1028 01/16/22 0410 01/16/22 1758 01/17/22 0441  WBC 13.3*  --  14.5* 15.1*  --  15.3*  NEUTROABS  --   --  11.4*  --   --  14.0*  HGB 9.3* 9.9* 9.3* 7.8* 8.3* 8.1*  HCT 29.8* 29.0* 29.4* 25.2* 25.9* 25.9*  MCV 93.1  --  91.9 93.3  --  92.8  PLT 174  --  192 169  --  161    Basic Metabolic Panel: Recent Labs  Lab 01/15/22 0928 01/15/22 1017 01/15/22 1323 01/16/22 0410 01/17/22 0441 01/17/22 1005   NA 139 138  --  135 132* 129*  K 3.7 3.6  --  5.3* 5.8* 5.4*  CL 103 101  --  98 95* 93*  CO2 22  --   --  21* 18* 16*  GLUCOSE 104* 101*  --  253* 367* 360*  BUN 102* 108*  --  116* 145* 149*  CREATININE 5.68* 6.30*  --  6.85* 8.49* 8.64*  CALCIUM 7.6*  --   --  7.9* 7.4* 7.5*  MG  --   --  1.9  --  2.4  --   PHOS  --   --  4.9*  --  9.7* 9.7*    GFR: Estimated Creatinine Clearance: 11 mL/min (A) (by C-G formula based on SCr of 8.64 mg/dL (H)).  Liver Function Tests: Recent Labs  Lab 01/15/22 0928 01/17/22 0441 01/17/22 1005  AST 80* 510*  --   ALT 24 123*  --   ALKPHOS 123 133*  --   BILITOT 1.0 1.3*  --   PROT 6.1* 6.4*  --   ALBUMIN 1.7* 1.7* 1.6*   No results for input(s): "LIPASE", "AMYLASE" in the last 168 hours. Recent Labs  Lab 01/15/22 1026  AMMONIA 27    Coagulation Profile: Recent Labs  Lab 01/15/22 1028  INR 1.3*    Cardiac Enzymes: No results for input(s): "CKTOTAL", "CKMB", "CKMBINDEX", "TROPONINI" in the last 168 hours.  BNP (last 3 results) No results for input(s): "PROBNP" in the last 8760 hours.  Lipid Profile: No results for input(s): "CHOL", "HDL", "LDLCALC", "TRIG", "CHOLHDL", "LDLDIRECT" in the last 72 hours.  Thyroid Function Tests: Recent Labs    01/15/22 1028  TSH 0.874    Anemia Panel: Recent Labs    01/15/22 1323  FERRITIN 381*  TIBC 171*  IRON 13*    Urine analysis:    Component Value Date/Time   COLORURINE YELLOW 05/04/2008 2129   APPEARANCEUR Cloudy (A) 08/19/2015 1403   LABSPEC 1.012 05/04/2008 2129   PHURINE 5.5 05/04/2008 2129   GLUCOSEU Negative 08/19/2015 1403   GLUCOSEU 500 (A) 05/04/2008 2129   HGBUR SMALL (A) 04/22/2008 0848   BILIRUBINUR Negative 08/19/2015 1403   KETONESUR NEG mg/dL 05/04/2008 2129   PROTEINUR 1+ (A) 08/19/2015 1403   PROTEINUR 30 (A) 05/04/2008 2129  UROBILINOGEN 0.2 05/04/2008 2129   NITRITE Negative 08/19/2015 1403   NITRITE NEG 05/04/2008 2129   LEUKOCYTESUR 1+ (A)  08/19/2015 1403    Sepsis Labs: Lactic Acid, Venous    Component Value Date/Time   LATICACIDVEN 2.6 (HH) 01/15/2022 1026    MICROBIOLOGY: Recent Results (from the past 240 hour(s))  Blood Culture (routine x 2)     Status: None (Preliminary result)   Collection Time: 01/15/22 10:04 AM   Specimen: BLOOD RIGHT FOREARM  Result Value Ref Range Status   Specimen Description BLOOD RIGHT FOREARM  Final   Special Requests   Final    BOTTLES DRAWN AEROBIC AND ANAEROBIC Blood Culture adequate volume   Culture   Final    NO GROWTH 2 DAYS Performed at [facility], [place] [address] [place], [place] [address]    Report Status PENDING  Incomplete  Resp Panel by RT-PCR (Flu A&B, Covid) Anterior Nasal Swab     Status: None   Collection Time: 01/15/22 10:05 AM   Specimen: Anterior Nasal Swab  Result Value Ref Range Status   SARS Coronavirus 2 by RT PCR NEGATIVE NEGATIVE Final    Comment: (NOTE) SARS-CoV-2 target nucleic acids are NOT DETECTED.  The SARS-CoV-2 RNA is generally detectable in upper respiratory specimens during the acute phase of infection. The lowest concentration of SARS-CoV-2 viral copies this assay can detect is 138 copies/mL. A negative result does not preclude SARS-Cov-2 infection and should not be used as the sole basis for treatment or other patient management decisions. A negative result may occur with  improper specimen collection/handling, submission of specimen other than nasopharyngeal swab, presence of viral mutation(s) within the areas targeted by this assay, and inadequate number of viral copies(<138 copies/mL). A negative result must be combined with clinical observations, patient history, and epidemiological information. The expected result is Negative.  Fact Sheet for Patients:  EntrepreneurPulse.com.au  Fact Sheet for Healthcare Providers:  IncredibleEmployment.be  This test is no t yet approved or cleared by  the [place] FDA and  has been authorized for detection and/or diagnosis of SARS-CoV-2 by FDA under an Emergency Use Authorization (EUA). This EUA will remain  in effect (meaning this test can be used) for the duration of the COVID-19 declaration under Section 564(b)(1) of the Act, 21 U.S.C.section 360bbb-3(b)(1), unless the authorization is terminated  or revoked sooner.       Influenza A by PCR NEGATIVE NEGATIVE Final   Influenza B by PCR NEGATIVE NEGATIVE Final    Comment: (NOTE) The Xpert Xpress SARS-CoV-2/FLU/RSV plus assay is intended as an aid in the diagnosis of influenza from Nasopharyngeal swab specimens and should not be used as a sole basis for treatment. Nasal washings and aspirates are unacceptable for Xpert Xpress SARS-CoV-2/FLU/RSV testing.  Fact Sheet for Patients: EntrepreneurPulse.com.au  Fact Sheet for Healthcare Providers: IncredibleEmployment.be  This test is not yet approved or cleared by the [place] FDA and has been authorized for detection and/or diagnosis of SARS-CoV-2 by FDA under an Emergency Use Authorization (EUA). This EUA will remain in effect (meaning this test can be used) for the duration of the COVID-19 declaration under Section 564(b)(1) of the Act, 21 U.S.C. section 360bbb-3(b)(1), unless the authorization is terminated or revoked.  Performed at [facility], [place] [address] [place], [place] [address]   Blood Culture (routine x 2)     Status: None (Preliminary result)   Collection Time: 01/15/22 10:09 AM   Specimen: BLOOD  Result Value  Ref Range Status   Specimen Description BLOOD SITE NOT SPECIFIED  Final   Special Requests   Final    BOTTLES DRAWN AEROBIC AND ANAEROBIC Blood Culture adequate volume   Culture   Final    NO GROWTH 2 DAYS Performed at [facility], [address], [place], [place] [address]    Report Status PENDING  Incomplete  MRSA Next Gen by PCR, Nasal      Status: None   Collection Time: 01/15/22  1:28 PM   Specimen: Nasal Mucosa; Nasal Swab  Result Value Ref Range Status   MRSA by PCR Next Gen NOT DETECTED NOT DETECTED Final    Comment: (NOTE) The GeneXpert MRSA Assay (FDA approved for NASAL specimens only), is one component of a comprehensive MRSA colonization surveillance program. It is not intended to diagnose MRSA infection nor to guide or monitor treatment for MRSA infections. Test performance is not FDA approved in patients less than 65 years old. Performed at [facility], [place] [address], [name], [place] [address]   Body fluid culture w Gram Stain     Status: None (Preliminary result)   Collection Time: 01/15/22  5:59 PM   Specimen: Synovial Fluid  Result Value Ref Range Status   Specimen Description FLUID SYNOVIAL KNEE LEFT  Final   Special Requests NONE  Final   Gram Stain   Final    ABUNDANT WBC PRESENT, PREDOMINANTLY PMN NO ORGANISMS SEEN    Culture   Final    NO GROWTH 2 DAYS Performed at [facility], [place] [address], [place], [place] [address]    Report Status PENDING  Incomplete  Anaerobic culture w Gram Stain     Status: None (Preliminary result)   Collection Time: 01/15/22  5:59 PM   Specimen: Synovial Fluid  Result Value Ref Range Status   Specimen Description FLUID SYNOVIAL KNEE LEFT  Final   Special Requests NONE  Final   Gram Stain   Final    ABUNDANT WBC PRESENT, PREDOMINANTLY PMN NO ORGANISMS SEEN Performed at [facility], [place] [address] [place], [place] [address]    Culture   Final    NO ANAEROBES ISOLATED; CULTURE IN PROGRESS FOR 5 DAYS   Report Status PENDING  Incomplete  Body fluid culture w Gram Stain     Status: None (Preliminary result)   Collection Time: 01/16/22 11:48 AM   Specimen: Synovium; Body Fluid  Result Value Ref Range Status   Specimen Description SYNOVIAL  Final   Special Requests  RIGHT KNEE  Final   Gram Stain   Final    ABUNDANT WBC PRESENT,  PREDOMINANTLY PMN NO ORGANISMS SEEN    Culture   Final    NO GROWTH < 24 HOURS Performed at [facility], [address] [place], [place] [address]    Report Status PENDING  Incomplete    RADIOLOGY STUDIES/RESULTS: ECHOCARDIOGRAM COMPLETE  Result Date: 01/17/2022    ECHOCARDIOGRAM REPORT   Patient Name:   HUNT ZAJICEK Date of Exam: 01/17/2022 Medical Rec #:  [id number]      Height:       71.0 in Accession #:    [id number]     Weight:       272.9 lb Date of Birth:  1956/04/03       BSA:          2.406 m Patient Age:    55 years  BP:           123/65 mmHg Patient Gender: M              HR:           84 bpm. Exam Location:  Inpatient Procedure: 2D Echo, Cardiac Doppler, Color Doppler and Intracardiac            Opacification Agent Indications:    Abnormal CXR  History:        Patient has prior history of Echocardiogram examinations, most                 recent 04/20/2019. CHF; Risk Factors:Hypertension, Diabetes and                 Dyslipidemia.  Sonographer:    Jefferey Pica Referring Phys: [id number] A CALDWELL POWELL JR IMPRESSIONS  1. Left ventricular ejection fraction, by estimation, is 55 to 60%. The left ventricle has normal function. The left ventricle has no regional wall motion abnormalities. There is mild concentric left ventricular hypertrophy. Left ventricular diastolic parameters were normal.  2. Right ventricular systolic function is normal. The right ventricular size is normal. There is normal pulmonary artery systolic pressure.  3. The mitral valve is normal in structure. Trivial mitral valve regurgitation. No evidence of mitral stenosis.  4. The aortic valve is tricuspid. Aortic valve regurgitation is not visualized. No aortic stenosis is present.  5. The inferior vena cava is normal in size with greater than 50% respiratory variability, suggesting right atrial pressure of 3 mmHg. FINDINGS  Left Ventricle: Left ventricular ejection fraction, by estimation, is 55 to 60%. The left  ventricle has normal function. The left ventricle has no regional wall motion abnormalities. The left ventricular internal cavity size was normal in size. There is  mild concentric left ventricular hypertrophy. Left ventricular diastolic parameters were normal. Right Ventricle: The right ventricular size is normal. No increase in right ventricular wall thickness. Right ventricular systolic function is normal. There is normal pulmonary artery systolic pressure. The tricuspid regurgitant velocity is 2.74 m/s, and  with an assumed right atrial pressure of 5 mmHg, the estimated right ventricular systolic pressure is 32.9 mmHg. Left Atrium: Left atrial size was normal in size. Right Atrium: Right atrial size was normal in size. Pericardium: There is no evidence of pericardial effusion. Mitral Valve: The mitral valve is normal in structure. Trivial mitral valve regurgitation. No evidence of mitral valve stenosis. Tricuspid Valve: The tricuspid valve is normal in structure. Tricuspid valve regurgitation is trivial. No evidence of tricuspid stenosis. Aortic Valve: The aortic valve is tricuspid. Aortic valve regurgitation is not visualized. No aortic stenosis is present. Aortic valve peak gradient measures 8.9 mmHg. Pulmonic Valve: The pulmonic valve was not well visualized. Pulmonic valve regurgitation is not visualized. No evidence of pulmonic stenosis. Aorta: The aortic root is normal in size and structure. Venous: The inferior vena cava is normal in size with greater than 50% respiratory variability, suggesting right atrial pressure of 3 mmHg. IAS/Shunts: No atrial level shunt detected by color flow Doppler.  LEFT VENTRICLE PLAX 2D LVIDd:         5.50 cm   Diastology LVIDs:         3.70 cm   LV e' medial:    7.49 cm/s LV PW:         1.20 cm   LV E/e' medial:  13.0 LV IVS:        1.20 cm   LV e'  lateral:   7.56 cm/s LVOT diam:     2.00 cm   LV E/e' lateral: 12.9 LV SV:         82 LV SV Index:   34 LVOT Area:     3.14 cm   RIGHT VENTRICLE             IVC RV S prime:     12.80 cm/s  IVC diam: 2.00 cm TAPSE (M-mode): 2.6 cm LEFT ATRIUM           Index        RIGHT ATRIUM           Index LA diam:      4.10 cm 1.70 cm/m   RA Area:     17.00 cm LA Vol (A4C): 68.8 ml 28.59 ml/m  RA Volume:   39.30 ml  16.33 ml/m  AORTIC VALVE                 PULMONIC VALVE AV Area (Vmax): 2.99 cm     PV Vmax:       0.89 m/s AV Vmax:        149.00 cm/s  PV Peak grad:  3.2 mmHg AV Peak Grad:   8.9 mmHg LVOT Vmax:      142.00 cm/s LVOT Vmean:     78.300 cm/s LVOT VTI:       0.260 m  AORTA Ao Root diam: 3.50 cm Ao Asc diam:  3.40 cm MITRAL VALVE               TRICUSPID VALVE MV Area (PHT): 7.09 cm    TR Peak grad:   30.0 mmHg MV Decel Time: 107 msec    TR Vmax:        274.00 cm/s MV E velocity: 97.30 cm/s MV A velocity: 47.70 cm/s  SHUNTS MV E/A ratio:  2.04        Systemic VTI:  0.26 m                            Systemic Diam: 2.00 cm Kardie Tobb DO Electronically signed by Berniece Salines DO Signature Date/Time: 01/17/2022/11:57:08 AM    Final    US RENAL  Result Date: 01/16/2022 CLINICAL DATA:  AKI. EXAM: RENAL / URINARY TRACT ULTRASOUND COMPLETE COMPARISON:  08/17/2021. FINDINGS: Right Kidney: Renal measurements: 10.5 x 5.4 x 4.8 cm = volume: 142.05 mL. Echogenicity within normal limits. No mass or hydronephrosis visualized. Left Kidney: Renal measurements: 10.3 x 5.9 x 4.6 cm = volume: 146.72 mL. Echogenicity within normal limits. An avascular hypoechoic region is noted in the mid left kidney measuring 2.5 x 1.7 cm, possible cyst. No mass or hydronephrosis visualized. Bladder: Appears normal for degree of bladder distention. Other: Examination is limited due to patient's body habitus and limited mobility. IMPRESSION: 1. Avascular hypoechoic structure in the mid left kidney, possible cyst. 2. Normal evaluation of the right kidney and bladder. Electronically Signed   By: Brett Fairy M.D.   On: 01/16/2022 20:16   MR BRAIN WO CONTRAST  Result Date:  01/16/2022 CLINICAL DATA:  Neuro deficit, acute, stroke suspected. EXAM: MRI HEAD WITHOUT CONTRAST TECHNIQUE: Multiplanar, multiecho pulse sequences of the brain and surrounding structures were obtained without intravenous contrast. COMPARISON:  01/15/2022 CT FINDINGS: Brain: Diffusion imaging does not show any acute or subacute infarction or other cause of restricted diffusion. There is generalized brain atrophy, advanced for age. Old small vessel  infarctions affect the central pons. Few old small vessel cerebellar infarctions. Cerebral hemispheres show old infarctions of the left basal ganglia and radiating white matter tracts. Mild chronic small-vessel ischemic change seen elsewhere. No large vessel territory infarction. No mass, hemorrhage, hydrocephalus or extra-axial collection. Vascular: Major vessels at the base of the brain show flow. Skull and upper cervical spine: Negative Sinuses/Orbits: Clear/normal Other: None IMPRESSION: No acute MR finding. Accelerated generalized atrophy. 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 Electronically Signed   By: Nelson Chimes M.D.   On: 01/16/2022 16:12   DG Hand 2 View Left  Result Date: 01/16/2022 CLINICAL DATA:  Pain and swelling after falling from bed. EXAM: LEFT HAND - 2 VIEW COMPARISON:  None Available. FINDINGS: Generalized regional swelling. No evidence fracture or dislocation. Small erosions are noted at several of the inter phalangeal joints which could be seen with erosive arthritis including erosive osteoarthritis. IMPRESSION: No acute finding other than soft tissue swelling. 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 Electronically Signed   By: Nelson Chimes M.D.   On: 01/16/2022 15:07   DG Chest 1 View  Result Date: 01/16/2022 CLINICAL DATA:  Sepsis. EXAM: CHEST  1 VIEW COMPARISON:   01/15/2022 FINDINGS: 0505 hours. The cardio pericardial silhouette is enlarged. Low volume film. Bibasilar atelectasis noted. There is pulmonary vascular congestion without overt pulmonary edema. Telemetry leads overlie the chest. IMPRESSION: Enlarged cardiopericardial silhouette with vascular congestion. Electronically Signed   By: Misty Stanley M.D.   On: 01/16/2022 06:00     LOS: 2 days   Oren Binet, MD  [facility]    To contact the attending provider between 7A-7P or the covering provider during after hours 7P-7A, please log into the web site [email or web address] and access using universal [place] password for that web site. If you do not have the password, please call the hospital operator.  01/17/2022, 1:51 PM

## 2022-01-17 NOTE — Progress Notes (Signed)
Pharmacy Antibiotic Note  [name] is a 66 y.o. male admitted on 01/15/2022 with  unknown source and potentially sepsis .  Pharmacy  consulted on 01/15/22 for cefepime and vancomycin dosing. IV Vancomycin 2G IV x 1 was given 01/15/22 AM, then plan is for  variable vancomycin dosing based on vancomycin level, due to  AKI on CKD V.   AKI on CKD stage V (SCr 4.1 on 01/09/22 PTA),  SCr  increasing 6.30>6.85> up to 8.49 today.   Estimated CrCl 11.2 ml/min Today 01/17/22 the vancomycin random level is = 22 mcg/ml.  No Vancomycin needed today.  Will dose Vancomycin again once vancomycin level is < or = 15 mcg/ml.   Micro:  8/14 BCx x2:  no growth to date 8/14 Fluid cx left knee:  no growth to date 8/14 MRSA PCR: neg 8/14 covid pcr: neg ; flu pcr: neg  Antimicrobials this admission:  Vanc 8/14>> Cefepime 8/14>> Metrondiazole 8/14 x1 dose   Plan: Continue Cefepime 2g IV q24h  Continue Vancomycin variable dosing based on vancomycin levels.  Will dose Vancomycin again once vancomycin level is < or = 15 mcg/ml.  Vancomycin random levels per protocol Monitor clinical status, renal function and culture results daily.    Height: '5\' 11"'$  (180.3 cm) Weight: 123.8 kg (272 lb 14.9 oz) IBW/kg (Calculated) : 75.3  Temp (24hrs), Avg:98 F (36.7 C), Min:97.7 F (36.5 C), Max:98.6 F (37 C)  Recent Labs  Lab 01/15/22 0928 01/15/22 1017 01/15/22 1026 01/15/22 1028 01/16/22 0410 01/17/22 0441  WBC 13.3*  --   --  14.5* 15.1* 15.3*  CREATININE 5.68* 6.30*  --   --  6.85* 8.49*  LATICACIDVEN  --   --  2.6*  --   --   --   VANCORANDOM  --   --   --   --   --  22     Estimated Creatinine Clearance: 11.2 mL/min (A) (by C-G formula based on SCr of 8.49 mg/dL (H)).    No Known Allergies   Thank you for allowing pharmacy to be a part of this patient's care.  Nicole Cella, RPh Clinical Pharmacist [phone] 01/17/2022 8:56 AM  Please check AMION for all [place] phone numbers After 10:00 PM,  call [place] [phone]

## 2022-01-17 NOTE — Progress Notes (Signed)
Patient Status: [facility] - In-pt  Assessment and Plan: Patient in need of venous access for dialysis initiation.  Patient admitted with CKD stage V.  He had AVF superficialization 01/09/22, however developed L knee pain at home.  Now admitted for septic arthritis with acute on chronic renal failure.  He is in need of dialysis.  IR consulted for tunneled dialysis catheter placement, however patient with elevated WBC in the setting of septic arthritis. He had blood cultures drawn yesterday which have been negative x24 hrs.  Discussed with Dr. Hollie Salk- proceed with temporary dialysis catheter placement until infection markers improved, then plan to place tunneled dialysis catheter prior to discharge home.   Plan discussed with patient and son at bedside who are agreeable.   Risks and benefits discussed with the patient including, but not limited to bleeding, infection, vascular injury, pneumothorax which may require chest tube placement, air embolism or even death  All of the patient's questions were answered, patient is agreeable to proceed. Consent signed and in chart.  ______________________________________________________________________   History of Present Illness: [name] is a 66 y.o. male with history of HTN, HLD, DM2, CKD 5, prostate cancer currently undergoing treatment who developed left knee pain at home.  He was brought to ED for confusion, found to have septic arthritis with AKI.  He is in need of dialysis initiation.  IR consulted for dialysis catheter placement as patient's AVF has not yet matured.   Allergies and medications reviewed.   Review of Systems: A 12 point ROS discussed and pertinent positives are indicated in the HPI above.  All other systems are negative.  Review of Systems  Constitutional:  Negative for fatigue and fever.  Respiratory:  Negative for cough and shortness of breath.   Cardiovascular:  Negative for chest pain.  Gastrointestinal:  Negative for  abdominal pain, diarrhea, nausea and vomiting.  Musculoskeletal:  Negative for back pain.  Psychiatric/Behavioral:  Positive for confusion. Negative for behavioral problems.     Vital Signs: BP 134/75 (BP Location: Right Wrist)   Pulse 74   Temp (!) 97.5 F (36.4 C) (Oral)   Resp 20   Ht '5\' 11"'$  (1.803 m)   Wt 272 lb 14.9 oz (123.8 kg)   SpO2 97%   BMI 38.07 kg/m   Physical Exam Vitals and nursing note reviewed.  Constitutional:      General: He is not in acute distress.    Appearance: Normal appearance. He is not ill-appearing.  HENT:     Mouth/Throat:     Mouth: Mucous membranes are moist.     Pharynx: Oropharynx is clear.  Cardiovascular:     Rate and Rhythm: Normal rate and regular rhythm.  Pulmonary:     Effort: Pulmonary effort is normal.     Breath sounds: Normal breath sounds.  Musculoskeletal:     Cervical back: Normal range of motion and neck supple.  Skin:    General: Skin is warm and dry.  Neurological:     Mental Status: He is alert.      Imaging reviewed.   Labs:  COAGS: Recent Labs    01/15/22 1028  INR 1.3*  APTT 51*    BMP: Recent Labs    01/15/22 0928 01/15/22 1017 01/16/22 0410 01/17/22 0441 01/17/22 1005  NA 139 138 135 132* 129*  K 3.7 3.6 5.3* 5.8* 5.4*  CL 103 101 98 95* 93*  CO2 22  --  21* 18* 16*  GLUCOSE 104* 101* 253*  367* 360*  BUN 102* 108* 116* 145* 149*  CALCIUM 7.6*  --  7.9* 7.4* 7.5*  CREATININE 5.68* 6.30* 6.85* 8.49* 8.64*  GFRNONAA 10*  --  8* 6* 6*       Electronically Signed: Docia Barrier, PA 01/17/2022, 3:26 PM   I spent a total of 15 minutes in face to face in clinical consultation, greater than 50% of which was counseling/coordinating care for chronic kidney disease stage 5.

## 2022-01-17 NOTE — TOC Initial Note (Signed)
Transition of Care [facility]) - Initial/Assessment Note    Patient Details  Name: [name] MRN: [id number] Date of Birth: 1955/07/08  [facility]) CM/SW Contact:    Cyndi Bender, RN Phone Number: 01/17/2022, 4:40 PM  Clinical Narrative:                 Following patient for high risk for readmission. Spoke to patient and son regarding transition needs. Patient lives alone and drives but if needed son can transport patient.  Patient is going to need dialysis. TOC will continue to follow for needs. Address, Phone number and PCP verified.     Barriers to Discharge: Continued Medical Work up   Patient Goals and CMS Choice Patient states their goals for this hospitalization and ongoing recovery are:: return home      Expected Discharge Plan and Services         Living arrangements for the past 2 months: Single Family Home                                      Prior Living Arrangements/Services Living arrangements for the past 2 months: Single Family Home Lives with:: Self Patient language and need for interpreter reviewed:: Yes Do you feel safe going back to the place where you live?: Yes      Need for Family Participation in Patient Care: Yes (Comment) Care giver support system in place?: Yes (comment)   Criminal Activity/Legal Involvement Pertinent to Current Situation/Hospitalization: No - Comment as needed  Activities of Daily Living      Permission Sought/Granted                  Emotional Assessment Appearance:: Appears stated age Attitude/Demeanor/Rapport: Engaged Affect (typically observed): Accepting        Admission diagnosis:  Encephalopathy [G93.40] Sepsis ([place]) [A41.9] Altered mental status, unspecified altered mental status type [R41.82] Patient Active Problem List   Diagnosis Date Noted   SIRS (systemic inflammatory response syndrome) ([place]) 51/07/5850   Acute metabolic encephalopathy 77/82/4235   Hypoglycemia  associated with type 2 diabetes mellitus ([place]) 01/16/2022   Gout flare 01/16/2022   Left knee pain 01/16/2022   Current chronic use of systemic steroids 01/16/2022   Prostate cancer ([place]) 01/16/2022   Left arm weakness 01/16/2022   Abnormal CXR 01/16/2022   Sepsis ([place]) 01/15/2022   Altered mental status    Elevated prostate specific antigen (PSA) 05/16/2021   Pain due to onychomycosis of toenails of both feet 03/06/2019   Hyperparathyroidism, 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   Combined forms of age-related cataract of both eyes 10/18/2016   Vitreous hemorrhage of right eye ([place]) 10/18/2016   History of vitrectomy 08/19/2014   Healthcare maintenance 05/31/2014   Swelling of left hand 05/24/2014   CKD (chronic kidney disease) stage 5, GFR less than 15 ml/min (HCC) 01/30/2012   Morbid obesity ([place]) 10/22/2011   Nuclear cataract 10/11/2011   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 [place]), [place] - [address] [place] ([place]) [place] [address] Phone: [phone] Fax: [phone]     Social Determinants of Health (SDOH) Interventions    Readmission Risk Interventions     No data to display

## 2022-01-17 NOTE — Inpatient Diabetes Management (Signed)
Inpatient Diabetes Program Recommendations  AACE/ADA: New Consensus Statement on Inpatient Glycemic Control (2015)  Target Ranges:  Prepandial:   less than 140 mg/dL      Peak postprandial:   less than 180 mg/dL (1-2 hours)      Critically ill patients:  140 - 180 mg/dL   Lab Results  Component Value Date   GLUCAP 345 (H) 01/17/2022   HGBA1C 9.6 (H) 01/15/2022    Review of Glycemic Control  Latest Reference Range & Units 01/16/22 08:05 01/16/22 11:21 01/16/22 16:43 01/16/22 21:11 01/17/22 07:57  Glucose-Capillary 70 - 99 mg/dL 246 (H) 227 (H) 265 (H) 296 (H) 345 (H)   Diabetes history: DM 2 Outpatient Diabetes medications: Glipizide 10 mg bid, Levemir 40 units bid Current orders for Inpatient glycemic control:  Levemir 20 units bid Novolog 0-9 units tid   Solumedrol 40 mg Daily  Inpatient Diabetes Program Recommendations:    Note: Pt to get second dose of Levemir this am. Started Solumedrol yesterday ordered as 40 mg Daily  -  Increase Novolog Correction to 0-15 units tid  -  Add Novolog hs scale -  Add Novolog 4 units tid meal coverage if eating >50% of meals.  Thanks,  Tama Headings RN, MSN, BC-ADM Inpatient Diabetes Coordinator Team Pager [phone] (8a-5p)

## 2022-01-17 NOTE — Evaluation (Signed)
Physical Therapy Evaluation Patient Details Name: [name] MRN: [id number] DOB: [birth year] Today's Date: 01/17/2022  History of Present Illness  66 yo male presents to ED on 8/14 with hypoglycemia, AMS, recent fall, POD6 L AV fistula placement. CTH negative, FOBT +. s/p R and L knee aspiration on 8/14 and 8/15, respectively. Workup for SIRS due to acute gouty arthritis bilat knees. PMH includes CKD stage V, DM-2 with retinopathy and neuropathy, chronic HFpEF, HTN, HLD, prostate cancer, cardiomyopathy.  Clinical Impression   Pt presents with generalized weakness, bilat LE pain, max difficulty performing bed-level mobility, inability to transfer into standing, and decreased activity tolerance vs baseline. Pt to benefit from acute PT to address deficits. Pt requiring max assist to move to/from EOB, and unable to come to standing with x2 attempts from elevated bed and max PT assist. At baseline, pt ambulates without equipment in home and is independent. PT anticipates pt will need ST-SNF to return to PLOF prior to d/c home, pt and son in agreement. PT to progress mobility as tolerated, and will continue to follow acutely.         Recommendations for follow up therapy are one component of a multi-disciplinary discharge planning process, led by the attending physician.  Recommendations may be updated based on patient status, additional functional criteria and insurance authorization.  Follow Up Recommendations Skilled nursing-short term rehab (<3 hours/day) Can patient physically be transported by private vehicle: No    Assistance Recommended at Discharge Frequent or constant Supervision/Assistance  Patient can return home with the following  Two people to help with walking and/or transfers;A lot of help with bathing/dressing/bathroom;Assistance with cooking/housework;Help with stairs or ramp for entrance;Assist for transportation    Equipment Recommendations Other (comment) (tbd)   Recommendations for Other Services       Functional Status Assessment Patient has had a recent decline in their functional status and demonstrates the ability to make significant improvements in function in a reasonable and predictable amount of time.     Precautions / Restrictions Precautions Precautions: Fall Restrictions Weight Bearing Restrictions: No      Mobility  Bed Mobility Overal bed mobility: Needs Assistance Bed Mobility: Supine to Sit, Sit to Supine     Supine to sit: HOB elevated, Max assist Sit to supine: HOB elevated, Max assist   General bed mobility comments: assist for LE progression to/from EOB, trunk elevation/lowering, boost up in bed upon return to supine with trendelenburg function and bed pads.    Transfers Overall transfer level: Needs assistance Equipment used: Rolling walker (2 wheels) Transfers: Sit to/from Stand Sit to Stand: Max assist           General transfer comment: unable to progress to stand with max assist, x2 attempts    Ambulation/Gait                  Stairs            Wheelchair Mobility    Modified Rankin (Stroke Patients Only)       Balance Overall balance assessment: Needs assistance Sitting-balance support: No upper extremity supported, Feet supported Sitting balance-Leahy Scale: Fair     Standing balance support: Bilateral upper extremity supported, During functional activity Standing balance-Leahy Scale: Zero                               Pertinent Vitals/Pain Pain Assessment Pain Assessment: Faces Faces Pain Scale: Hurts whole lot Pain  Location: bilat knees L>R Pain Descriptors / Indicators: Sore, Discomfort Pain Intervention(s): Limited activity within patient's tolerance, Monitored during session, Repositioned    Home Living Family/patient expects to be discharged to:: Private residence Living Arrangements: Alone Available Help at Discharge: Family Type of Home:  House Home Access: Stairs to enter Entrance Stairs-Rails: Right;Can reach Software engineer of Steps: 5   Home Layout: One level Home Equipment: Conservation officer, nature (2 wheels);BSC/3in1      Prior Function Prior Level of Function : Needs assist             Mobility Comments: pt reports using RW as needed in community       Hand Dominance   Dominant Hand: Right    Extremity/Trunk Assessment   Upper Extremity Assessment Upper Extremity Assessment: Defer to OT evaluation    Lower Extremity Assessment Lower Extremity Assessment: Generalized weakness    Cervical / Trunk Assessment Cervical / Trunk Assessment: Normal  Communication   Communication: No difficulties  Cognition Arousal/Alertness: Awake/alert Behavior During Therapy: Flat affect Overall Cognitive Status: Impaired/Different from baseline Area of Impairment: Following commands, Safety/judgement, Problem solving                       Following Commands: Follows one step commands with increased time Safety/Judgement: Decreased awareness of safety, Decreased awareness of deficits   Problem Solving: Slow processing General Comments: tangential in conversation, at times requires cues to return to task at hand        General Comments      Exercises     Assessment/Plan    PT Assessment Patient needs continued PT services  PT Problem List Decreased strength;Decreased mobility;Decreased activity tolerance;Decreased balance;Decreased knowledge of use of DME;Decreased safety awareness;Decreased cognition;Pain       PT Treatment Interventions DME instruction;Therapeutic activities;Gait training;Therapeutic exercise;Patient/family education;Balance training;Functional mobility training;Neuromuscular re-education    PT Goals (Current goals can be found in the Care Plan section)  Acute Rehab PT Goals Patient Stated Goal: return to PLOF PT Goal Formulation: With patient/family Time For  Goal Achievement: 01/31/22 Potential to Achieve Goals: Good    Frequency Min 2X/week     Co-evaluation               AM-PAC PT "6 Clicks" Mobility  Outcome Measure Help needed turning from your back to your side while in a flat bed without using bedrails?: A Lot Help needed moving from lying on your back to sitting on the side of a flat bed without using bedrails?: A Lot Help needed moving to and from a bed to a chair (including a wheelchair)?: Total Help needed standing up from a chair using your arms (e.g., wheelchair or bedside chair)?: Total Help needed to walk in hospital room?: Total Help needed climbing 3-5 steps with a railing? : Total 6 Click Score: 8    End of Session Equipment Utilized During Treatment: Oxygen Activity Tolerance: Patient tolerated treatment well Patient left: with call bell/phone within reach;in bed;with bed alarm set;with family/visitor present Nurse Communication: Mobility status PT Visit Diagnosis: Other abnormalities of gait and mobility (R26.89)    Time: 0254-2706 PT Time Calculation (min) (ACUTE ONLY): 35 min   Charges:   PT Evaluation $PT Eval Low Complexity: 1 Low PT Treatments $Therapeutic Activity: 8-22 mins      Stacie Glaze, PT DPT Acute Rehabilitation Services Pager [phone]  Office [phone]   Roxine Caddy E Ruffin Pyo 01/17/2022, 4:18 PM

## 2022-01-17 NOTE — Consult Note (Signed)
[facility]  HISTORY AND PHYSICAL  [name] is an 66 y.o. male.    Chief Complaint: confusion  HPI: Pt is a 22M with a PMH sig for HTN, HLD, DM II, CKD V, and stage 3 prostate cancer who is now seen in consultation at the request of Dr Sloan Leiter for evalution and recommendations surrounding AKI on CKD V.  Was in his usual state of health until last week.  Had AVF superficialization 01/09/22 and initially did well.  However, started having increased L knee pain and was taking his pain pills given postoperative for the AVF for his knee pain.    He had some low blood sugars at home which led him to come here.    Once here, was noted to have L knee effusion and arthrocentesis was performed showing septic arthritis.  He is on vanc/ cefepime.    Cr has increased from baseline of 5.68--> 8.64 with BUN of 145.  Is still confused.  Renal US without obstruction 01/16/22.  In this setting we are asked to see.    Pt's son is at bedside and helps provide history.  Had L sided weakness during admission, MRI done yesterday without acute stroke.    PMH: Past Medical History:  Diagnosis Date   Arthritis    Cardiomyopathy    Resolved with EF 55% 2011   CARDIOMYOPATHY 04/22/2006   CARDIOMYOPATHY 04/22/2006   2- D Echo (04/2010) : The EF is probably 55% with some hypokinesis at the base of the inferior wall. Wall thickness was increased in a pattern of mild LVH. Patient followed by Dr Martinique (Cardiology)      CHF (congestive heart failure) ([place])    Chronic kidney disease    Diabetes mellitus    DIABETIC  RETINOPATHY 02/06/2007   GERD (gastroesophageal reflux disease)    Hyperlipemia    Hypertension    Iron deficiency 04/09/2018   NEPHROPATHY, DIABETIC 06/08/2006   Paroxysmal atrial tachycardia (HCC)    PSH: Past Surgical History:  Procedure Laterality Date   AV FISTULA PLACEMENT Left 06/06/2021   Procedure: LEFT BRACHIOCEPHALIC ARTERIOVENOUS (AV) FISTULA CREATION;  Surgeon: Broadus John, MD;  Location: [facility];  Service: Vascular;  Laterality: Left;  PERIPHERAL NERVE BLOCK   CARDIAC CATHETERIZATION  Dec 2007   normal; EF- 20-25%   FISTULA SUPERFICIALIZATION Left 01/09/2022   Procedure: LEFT BRACHIOCEPHALIC FISTULA SUPERFICIALIZATION;  Surgeon: Broadus John, MD;  Location: [facility] OR;  Service: Vascular;  Laterality: Left;  PERIPHERAL NERVE BLOCK    Past Medical History:  Diagnosis Date   Arthritis    Cardiomyopathy    Resolved with EF 55% 2011   CARDIOMYOPATHY 04/22/2006   CARDIOMYOPATHY 04/22/2006   2- D Echo (04/2010) : The EF is probably 55% with some hypokinesis at the base of the inferior wall. Wall thickness was increased in a pattern of mild LVH. Patient followed by Dr Martinique (Cardiology)      CHF (congestive heart failure) ([place])    Chronic kidney disease    Diabetes mellitus    DIABETIC  RETINOPATHY 02/06/2007   GERD (gastroesophageal reflux disease)    Hyperlipemia    Hypertension    Iron deficiency 04/09/2018   NEPHROPATHY, DIABETIC 06/08/2006   Paroxysmal atrial tachycardia (HCC)     Medications:  Scheduled:  abiraterone acetate  1,000 mg Oral Daily   aspirin EC  81 mg Oral Daily   atorvastatin  80 mg Oral Daily   calcitRIOL  0.25 mcg Oral  Daily   heparin  5,000 Units Subcutaneous Q12H   insulin aspart  0-9 Units Subcutaneous TID WC   insulin detemir  20 Units Subcutaneous BID   methylPREDNISolone (SOLU-MEDROL) injection  40 mg Intravenous Q1200   pantoprazole (PROTONIX) IV  40 mg Intravenous Q12H   sodium chloride flush  3 mL Intravenous Q12H   sodium chloride flush  3 mL Intravenous Q12H   sodium zirconium cyclosilicate  10 g Oral TID   vancomycin variable dose per unstable renal function (pharmacist dosing)   Does not apply See admin instructions    Medications Prior to Admission  Medication Sig Dispense Refill   abiraterone acetate (ZYTIGA) 250 MG tablet Take 1,000 mg by mouth daily.     acetaminophen (TYLENOL) 500 MG tablet Take  500-1,000 mg by mouth every 6 (six) hours as needed for moderate pain.     aspirin 81 MG tablet Take 1 tablet (81 mg total) by mouth daily. 90 tablet 0   atorvastatin (LIPITOR) 80 MG tablet Take 1 tablet (80 mg total) by mouth daily. 90 tablet 3   calcitRIOL (ROCALTROL) 0.25 MCG capsule Take 0.25 mcg by mouth daily.     carvedilol (COREG) 25 MG tablet TAKE 1 TABLET BY MOUTH TWICE DAILY WITH A MEAL 180 tablet 0   furosemide (LASIX) 80 MG tablet Take 80 mg by mouth 2 (two) times daily.     glipiZIDE (GLUCOTROL) 10 MG tablet Take 10 mg by mouth 2 (two) times daily.     hydrALAZINE (APRESOLINE) 50 MG tablet Take 1.5 tablets (75 mg total) by mouth 3 (three) times daily. (Patient taking differently: Take 50 mg by mouth in the morning and at bedtime.) 270 tablet 1   INSULIN LISPRO, 1 UNIT DIAL, [place] Inject 15 Units into the skin 3 (three) times daily. 200 units/ml     LEVEMIR 100 UNIT/ML injection Inject 40 Units into the skin 2 (two) times daily.     oxyCODONE (OXY IR/ROXICODONE) 5 MG immediate release tablet Take 1 tablet (5 mg total) by mouth every 6 (six) hours as needed (for pain score of 1-4). 20 tablet 0   predniSONE (DELTASONE) 5 MG tablet Take 5 mg by mouth daily.     BAYER MICROLET LANCETS lancets Check blood sugar 3 times a day as instructed 100 each 12   Blood Glucose Monitoring Suppl (ONE TOUCH ULTRA MINI) w/Device KIT 1 strip by Other route 3 (three) times daily. 1 each 11   glucose blood (ONE TOUCH ULTRA TEST) test strip The patient is insulin requiring, ICD 10 code E11.65. The patient tests 3 times per day. 100 each 12   Insulin Pen Needle 31G X 6 MM MISC 1 Stick by Other route 3 (three) times daily. Use with insulin and liraglutide. 100 each 3   metFORMIN (GLUCOPHAGE) 500 MG tablet Take 1 tablet (500 mg total) by mouth 2 (two) times daily with a meal. (Patient not taking: Reported on 06/05/2021) 180 tablet 3    ALLERGIES:  No Known Allergies  FAM HX: Family History  Problem Relation Age  of Onset   Diabetes Mother    Diabetes Brother     Social History:   reports that he has never smoked. He has never been exposed to tobacco smoke. He has never used smokeless tobacco. He reports that he does not drink alcohol and does not use drugs.  ROS: ROS: all systems reviewed and are negative except as per HPI  Blood pressure 123/65, pulse 75, temperature 97.9  F (36.6 C), temperature source Oral, resp. rate 20, height 5' 11"  (1.803 m), weight 123.8 kg, SpO2 90 %. PHYSICAL EXAM: Physical Exam GEN NAD, sitting in bed, some cognitive slowing HEENT EOMI PERRL NECK NO JVD PULM  clear CV RRR  ABD + pannus EXT no LE edema NEURO cognitive slowing/ delayed processing, no frank asterixis MSK L knee tenderness ACCESS L AVF + T/B, healing well   Results for orders placed or performed during the hospital encounter of 01/15/22 (from the past 48 hour(s))  Hemoglobin A1c     Status: Abnormal   Collection Time: 01/15/22  1:15 PM  Result Value Ref Range   Hgb A1c MFr Bld 9.6 (H) 4.8 - 5.6 %    Comment: (NOTE) Pre diabetes:          5.7%-6.4%  Diabetes:              >6.4%  Glycemic control for   <7.0% adults with diabetes    Mean Plasma Glucose 228.82 mg/dL    Comment: Performed at [facility], [place] [address] [place], [place] [address]  HIV Antibody (routine testing w rflx)     Status: None   Collection Time: 01/15/22  1:23 PM  Result Value Ref Range   HIV Screen 4th Generation wRfx Non Reactive Non Reactive    Comment: Performed at [facility], [place] [address] [place], [place] [address]  Procalcitonin - Baseline     Status: None   Collection Time: 01/15/22  1:23 PM  Result Value Ref Range   Procalcitonin 15.66 ng/mL    Comment:        Interpretation: PCT >= 10 ng/mL: Important systemic inflammatory response, almost exclusively due to severe bacterial sepsis or septic shock. (NOTE)       Sepsis PCT Algorithm           Lower Respiratory Tract                                       Infection PCT Algorithm    ----------------------------     ----------------------------         PCT < 0.25 ng/mL                PCT < 0.10 ng/mL          Strongly encourage             Strongly discourage   discontinuation of antibiotics    initiation of antibiotics    ----------------------------     -----------------------------       PCT 0.25 - 0.50 ng/mL            PCT 0.10 - 0.25 ng/mL               OR       >80% decrease in PCT            Discourage initiation of                                            antibiotics      Encourage discontinuation           of antibiotics    ----------------------------     -----------------------------         PCT >=  0.50 ng/mL              PCT 0.26 - 0.50 ng/mL                AND       <80% decrease in PCT             Encourage initiation of                                             antibiotics       Encourage continuation           of antibiotics    ----------------------------     -----------------------------        PCT >= 0.50 ng/mL                  PCT > 0.50 ng/mL               AND         increase in PCT                  Strongly encourage                                      initiation of antibiotics    Strongly encourage escalation           of antibiotics                                     -----------------------------                                           PCT <= 0.25 ng/mL                                                 OR                                        > 80% decrease in PCT                                      Discontinue / Do not initiate                                             antibiotics  Performed at [facility], [address] [place], [place] [address]   Magnesium     Status: None   Collection Time: 01/15/22  1:23 PM  Result Value Ref Range   Magnesium 1.9 1.7 - 2.4 mg/dL    Comment: Performed at [facility] [address], [place], [place] [address]   Phosphorus  Status: Abnormal   Collection Time: 01/15/22  1:23 PM  Result Value Ref Range   Phosphorus 4.9 (H) 2.5 - 4.6 mg/dL    Comment: Performed at [facility] [address], [place], [place] [address]  Iron and TIBC     Status: Abnormal   Collection Time: 01/15/22  1:23 PM  Result Value Ref Range   Iron 13 (L) 45 - 182 ug/dL   TIBC 171 (L) 250 - 450 ug/dL   Saturation Ratios 8 (L) 17.9 - 39.5 %   UIBC 158 ug/dL    Comment: Performed at [facility] [address], [place], [place] [address]  Ferritin     Status: Abnormal   Collection Time: 01/15/22  1:23 PM  Result Value Ref Range   Ferritin 381 (H) 24 - 336 ng/mL    Comment: Performed at [facility], [place] [address] [place], [place] [address]  Type and screen Echo     Status: None   Collection Time: 01/15/22  1:28 PM  Result Value Ref Range   ABO/RH(D) B POS    Antibody Screen NEG    Sample Expiration      01/18/2022,2359 Performed at [facility], [place] [address], [place], [place] [address]   MRSA Next Gen by PCR, Nasal     Status: None   Collection Time: 01/15/22  1:28 PM   Specimen: Nasal Mucosa; Nasal Swab  Result Value Ref Range   MRSA by PCR Next Gen NOT DETECTED NOT DETECTED    Comment: (NOTE) The GeneXpert MRSA Assay (FDA approved for NASAL specimens only), is one component of a comprehensive MRSA colonization surveillance program. It is not intended to diagnose MRSA infection nor to guide or monitor treatment for MRSA infections. Test performance is not FDA approved in patients less than 77 years old. Performed at [facility], [place] [address], [place], [place] [address]   CBG monitoring, ED     Status: Abnormal   Collection Time: 01/15/22  4:46 PM  Result Value Ref Range   Glucose-Capillary 192 (H) 70 - 99 mg/dL    Comment: Glucose reference range applies only to samples taken after fasting for at least 8 hours.  Body fluid culture w Gram Stain      Status: None (Preliminary result)   Collection Time: 01/15/22  5:59 PM   Specimen: Synovial Fluid  Result Value Ref Range   Specimen Description FLUID SYNOVIAL KNEE LEFT    Special Requests NONE    Gram Stain      ABUNDANT WBC PRESENT, PREDOMINANTLY PMN NO ORGANISMS SEEN    Culture      NO GROWTH < 24 HOURS Performed at [facility], [place] [address] [place], [place] [address]    Report Status PENDING   Anaerobic culture w Gram Stain     Status: None (Preliminary result)   Collection Time: 01/15/22  5:59 PM   Specimen: Synovial Fluid  Result Value Ref Range   Specimen Description FLUID SYNOVIAL KNEE LEFT    Special Requests NONE    Gram Stain      ABUNDANT WBC PRESENT, PREDOMINANTLY PMN NO ORGANISMS SEEN Performed at [facility], [place] [address] [place], [place] [address]    Culture PENDING    Report Status PENDING   Synovial cell count + diff, w/ crystals     Status: Abnormal   Collection Time: 01/15/22  5:59 PM  Result Value Ref Range   Color, Synovial RED (A) YELLOW   Appearance-Synovial TURBID (A) CLEAR   Crystals, Fluid EXTRACELLULAR MONOSODIUM URATE CRYSTALS    WBC, Synovial 23,835 (H) 0 - 200 /cu mm   Neutrophil, Synovial 96 (H) 0 - 25 %   Lymphocytes-Synovial Fld 1 0 - 20 %   Monocyte-Macrophage-Synovial Fluid 3 (L) 50 - 90 %   Eosinophils-Synovial 0 0 - 1 %    Comment: Performed at [facility] [address], [place], [place] [address]  CBG monitoring, ED (now and then every hour for 3 hours)     Status: Abnormal   Collection Time: 01/16/22  1:19 AM  Result Value Ref Range   Glucose-Capillary 246 (H) 70 - 99 mg/dL    Comment: Glucose reference range applies only to samples taken after fasting for at least 8 hours.  ABO/Rh     Status: None   Collection Time: 01/16/22  1:23 AM  Result Value Ref Range   ABO/RH(D)      B POS Performed at [facility] [address], [place], [place] [address]   Basic metabolic panel     Status:  Abnormal   Collection Time: 01/16/22  4:10 AM  Result Value Ref Range   Sodium 135 135 - 145 mmol/L   Potassium 5.3 (H) 3.5 - 5.1 mmol/L    Comment: DELTA CHECK NOTED   Chloride 98 98 - 111 mmol/L   CO2 21 (L) 22 - 32 mmol/L   Glucose, Bld 253 (H) 70 - 99 mg/dL    Comment: Glucose reference range applies only to samples taken after fasting for at least 8 hours.   BUN 116 (H) 8 - 23 mg/dL   Creatinine, Ser 6.85 (H) 0.61 - 1.24 mg/dL   Calcium 7.9 (L) 8.9 - 10.3 mg/dL   GFR, Estimated 8 (L) >60 mL/min    Comment: (NOTE) Calculated using the CKD-EPI Creatinine Equation (2021)    Anion gap 16 (H) 5 - 15    Comment: Performed at [facility] [address], [place], [place] [address]  CBC     Status: Abnormal   Collection Time: 01/16/22  4:10 AM  Result Value Ref Range   WBC 15.1 (H) 4.0 - 10.5 K/uL   RBC 2.70 (L) 4.22 - 5.81 MIL/uL   Hemoglobin 7.8 (L) 13.0 - 17.0 g/dL   HCT 25.2 (L) 39.0 - 52.0 %   MCV 93.3 80.0 - 100.0 fL   MCH 28.9 26.0 - 34.0 pg   MCHC 31.0 30.0 - 36.0 g/dL   RDW 14.4 11.5 - 15.5 %   Platelets 169 150 - 400 K/uL   nRBC 0.0 0.0 - 0.2 %    Comment: Performed at [facility], [place] [address], [place], [place] [address]  Procalcitonin     Status: None   Collection Time: 01/16/22  4:10 AM  Result Value Ref Range   Procalcitonin 30.97 ng/mL    Comment:        Interpretation: PCT >= 10 ng/mL: Important systemic inflammatory response, almost exclusively due to severe bacterial sepsis or septic shock. (NOTE)       Sepsis PCT Algorithm           Lower Respiratory Tract  Infection PCT Algorithm    ----------------------------     ----------------------------         PCT < 0.25 ng/mL                PCT < 0.10 ng/mL          Strongly encourage             Strongly discourage   discontinuation of antibiotics    initiation of antibiotics    ----------------------------     -----------------------------       PCT  0.25 - 0.50 ng/mL            PCT 0.10 - 0.25 ng/mL               OR       >80% decrease in PCT            Discourage initiation of                                            antibiotics      Encourage discontinuation           of antibiotics    ----------------------------     -----------------------------         PCT >= 0.50 ng/mL              PCT 0.26 - 0.50 ng/mL                AND       <80% decrease in PCT             Encourage initiation of                                             antibiotics       Encourage continuation           of antibiotics    ----------------------------     -----------------------------        PCT >= 0.50 ng/mL                  PCT > 0.50 ng/mL               AND         increase in PCT                  Strongly encourage                                      initiation of antibiotics    Strongly encourage escalation           of antibiotics                                     -----------------------------                                           PCT <= 0.25 ng/mL  OR                                        > 80% decrease in PCT                                      Discontinue / Do not initiate                                             antibiotics  Performed at [facility], [place] [address] [place], [place] [address]   CBG monitoring, ED (now and then every hour for 3 hours)     Status: Abnormal   Collection Time: 01/16/22  8:05 AM  Result Value Ref Range   Glucose-Capillary 246 (H) 70 - 99 mg/dL    Comment: Glucose reference range applies only to samples taken after fasting for at least 8 hours.  Glucose, capillary     Status: Abnormal   Collection Time: 01/16/22 11:21 AM  Result Value Ref Range   Glucose-Capillary 227 (H) 70 - 99 mg/dL    Comment: Glucose reference range applies only to samples taken after fasting for at least 8 hours.  Body fluid culture w Gram Stain     Status: None  (Preliminary result)   Collection Time: 01/16/22 11:48 AM   Specimen: Synovium; Body Fluid  Result Value Ref Range   Specimen Description SYNOVIAL    Special Requests  RIGHT KNEE    Gram Stain      ABUNDANT WBC PRESENT, PREDOMINANTLY PMN NO ORGANISMS SEEN Performed at [facility], [address], [place], [place] [address]    Culture PENDING    Report Status PENDING   Synovial cell count + diff, w/ crystals     Status: Abnormal   Collection Time: 01/16/22  1:07 PM  Result Value Ref Range   Color, Synovial AMBER (A) YELLOW   Appearance-Synovial TURBID (A) CLEAR   Crystals, Fluid INTRACELLULAR MONOSODIUM URATE CRYSTALS     Comment: EXTRACELLULAR MONOSODIUM URATE CRYSTALS   WBC, Synovial 15,600 (H) 0 - 200 /cu mm    Comment: COUNT MAY BE INACCURATE DUE TO FIBRIN CLUMPS.   Neutrophil, Synovial 88 (H) 0 - 25 %   Lymphocytes-Synovial Fld 2 0 - 20 %   Monocyte-Macrophage-Synovial Fluid 10 (L) 50 - 90 %   Eosinophils-Synovial 0 0 - 1 %    Comment: Performed at [facility] [address], [place], [place] [address]  Glucose, capillary     Status: Abnormal   Collection Time: 01/16/22  4:43 PM  Result Value Ref Range   Glucose-Capillary 265 (H) 70 - 99 mg/dL    Comment: Glucose reference range applies only to samples taken after fasting for at least 8 hours.  Hemoglobin and hematocrit, blood     Status: Abnormal   Collection Time: 01/16/22  5:58 PM  Result Value Ref Range   Hemoglobin 8.3 (L) 13.0 - 17.0 g/dL   HCT 25.9 (L) 39.0 - 52.0 %    Comment: Performed at [facility], [place] [address], [place], [place] [address]  Glucose, capillary     Status: Abnormal   Collection  Time: 01/16/22  9:11 PM  Result Value Ref Range   Glucose-Capillary 296 (H) 70 - 99 mg/dL    Comment: Glucose reference range applies only to samples taken after fasting for at least 8 hours.  Procalcitonin     Status: None   Collection Time: 01/17/22  4:41 AM  Result Value Ref Range    Procalcitonin 33.95 ng/mL    Comment:        Interpretation: PCT >= 10 ng/mL: Important systemic inflammatory response, almost exclusively due to severe bacterial sepsis or septic shock. (NOTE)       Sepsis PCT Algorithm           Lower Respiratory Tract                                      Infection PCT Algorithm    ----------------------------     ----------------------------         PCT < 0.25 ng/mL                PCT < 0.10 ng/mL          Strongly encourage             Strongly discourage   discontinuation of antibiotics    initiation of antibiotics    ----------------------------     -----------------------------       PCT 0.25 - 0.50 ng/mL            PCT 0.10 - 0.25 ng/mL               OR       >80% decrease in PCT            Discourage initiation of                                            antibiotics      Encourage discontinuation           of antibiotics    ----------------------------     -----------------------------         PCT >= 0.50 ng/mL              PCT 0.26 - 0.50 ng/mL                AND       <80% decrease in PCT             Encourage initiation of                                             antibiotics       Encourage continuation           of antibiotics    ----------------------------     -----------------------------        PCT >= 0.50 ng/mL                  PCT > 0.50 ng/mL               AND         increase in PCT  Strongly encourage                                      initiation of antibiotics    Strongly encourage escalation           of antibiotics                                     -----------------------------                                           PCT <= 0.25 ng/mL                                                 OR                                        > 80% decrease in PCT                                      Discontinue / Do not initiate                                             antibiotics  Performed at [facility], [place] [address] [place], [place] [address]   Vancomycin, random     Status: None   Collection Time: 01/17/22  4:41 AM  Result Value Ref Range   Vancomycin Rm 22 ug/mL    Comment:        Random Vancomycin therapeutic range is dependent on dosage and time of specimen collection. A peak range is 20.0-40.0 ug/mL A trough range is 5.0-15.0 ug/mL        Performed at [facility] [address] [place],  [address]   CBC with Differential/Platelet     Status: Abnormal   Collection Time: 01/17/22  4:41 AM  Result Value Ref Range   WBC 15.3 (H) 4.0 - 10.5 K/uL   RBC 2.79 (L) 4.22 - 5.81 MIL/uL   Hemoglobin 8.1 (L) 13.0 - 17.0 g/dL   HCT 25.9 (L) 39.0 - 52.0 %   MCV 92.8 80.0 - 100.0 fL   MCH 29.0 26.0 - 34.0 pg   MCHC 31.3 30.0 - 36.0 g/dL   RDW 14.4 11.5 - 15.5 %   Platelets 163 150 - 400 K/uL   nRBC 0.0 0.0 - 0.2 %   Neutrophils Relative % 91 %   Neutro Abs 14.0 (H) 1.7 - 7.7 K/uL   Lymphocytes Relative 4 %   Lymphs Abs 0.5 (L) 0.7 - 4.0 K/uL   Monocytes Relative 4 %   Monocytes Absolute 0.7 0.1 - 1.0 K/uL   Eosinophils Relative 0 %   Eosinophils Absolute 0.0 0.0 - 0.5 K/uL   Basophils Relative 0 %  Basophils Absolute 0.0 0.0 - 0.1 K/uL   Immature Granulocytes 1 %   Abs Immature Granulocytes 0.10 (H) 0.00 - 0.07 K/uL    Comment: Performed at [facility], [place] [address], [place], [place] [address]  Comprehensive metabolic panel     Status: Abnormal   Collection Time: 01/17/22  4:41 AM  Result Value Ref Range   Sodium 132 (L) 135 - 145 mmol/L   Potassium 5.8 (H) 3.5 - 5.1 mmol/L   Chloride 95 (L) 98 - 111 mmol/L   CO2 18 (L) 22 - 32 mmol/L   Glucose, Bld 367 (H) 70 - 99 mg/dL    Comment: Glucose reference range applies only to samples taken after fasting for at least 8 hours.   BUN 145 (H) 8 - 23 mg/dL   Creatinine, Ser 8.49 (H) 0.61 - 1.24 mg/dL   Calcium 7.4 (L) 8.9 - 10.3 mg/dL   Total Protein 6.4 (L) 6.5 - 8.1 g/dL   Albumin 1.7  (L) 3.5 - 5.0 g/dL   AST 510 (H) 15 - 41 U/L   ALT 123 (H) 0 - 44 U/L   Alkaline Phosphatase 133 (H) 38 - 126 U/L   Total Bilirubin 1.3 (H) 0.3 - 1.2 mg/dL   GFR, Estimated 6 (L) >60 mL/min    Comment: (NOTE) Calculated using the CKD-EPI Creatinine Equation (2021)    Anion gap 19 (H) 5 - 15    Comment: Electrolytes repeated to confirm. Performed at [facility], [place] [address] [place], [place] [address]   Magnesium     Status: None   Collection Time: 01/17/22  4:41 AM  Result Value Ref Range   Magnesium 2.4 1.7 - 2.4 mg/dL    Comment: Performed at [facility] [address] [place], [place] [address]  Phosphorus     Status: Abnormal   Collection Time: 01/17/22  4:41 AM  Result Value Ref Range   Phosphorus 9.7 (H) 2.5 - 4.6 mg/dL    Comment: Performed at [facility] [address] [place] [address], [place], [place] [address]  Glucose, capillary     Status: Abnormal   Collection Time: 01/17/22  7:57 AM  Result Value Ref Range   Glucose-Capillary 345 (H) 70 - 99 mg/dL    Comment: Glucose reference range applies only to samples taken after fasting for at least 8 hours.  Renal function panel     Status: Abnormal   Collection Time: 01/17/22 10:05 AM  Result Value Ref Range   Sodium 129 (L) 135 - 145 mmol/L   Potassium 5.4 (H) 3.5 - 5.1 mmol/L   Chloride 93 (L) 98 - 111 mmol/L   CO2 16 (L) 22 - 32 mmol/L   Glucose, Bld 360 (H) 70 - 99 mg/dL    Comment: Glucose reference range applies only to samples taken after fasting for at least 8 hours.   BUN 149 (H) 8 - 23 mg/dL   Creatinine, Ser 8.64 (H) 0.61 - 1.24 mg/dL   Calcium 7.5 (L) 8.9 - 10.3 mg/dL   Phosphorus 9.7 (H) 2.5 - 4.6 mg/dL   Albumin 1.6 (L) 3.5 - 5.0 g/dL   GFR, Estimated 6 (L) >60 mL/min    Comment: (NOTE) Calculated using the CKD-EPI Creatinine Equation (2021)    Anion gap 20 (H) 5 - 15    Comment: Performed at [facility] [address] [place], [place] [address]    US RENAL  Result Date:  01/16/2022  CLINICAL DATA:  AKI. EXAM: RENAL / URINARY TRACT ULTRASOUND COMPLETE COMPARISON:  08/17/2021. FINDINGS: Right Kidney: Renal measurements: 10.5 x 5.4 x 4.8 cm = volume: 142.05 mL. Echogenicity within normal limits. No mass or hydronephrosis visualized. Left Kidney: Renal measurements: 10.3 x 5.9 x 4.6 cm = volume: 146.72 mL. Echogenicity within normal limits. An avascular hypoechoic region is noted in the mid left kidney measuring 2.5 x 1.7 cm, possible cyst. No mass or hydronephrosis visualized. Bladder: Appears normal for degree of bladder distention. Other: Examination is limited due to patient's body habitus and limited mobility. IMPRESSION: 1. Avascular hypoechoic structure in the mid left kidney, possible cyst. 2. Normal evaluation of the right kidney and bladder. Electronically Signed   By: Brett Fairy M.D.   On: 01/16/2022 20:16   MR BRAIN WO CONTRAST  Result Date: 01/16/2022 CLINICAL DATA:  Neuro deficit, acute, stroke suspected. EXAM: MRI HEAD WITHOUT CONTRAST TECHNIQUE: Multiplanar, multiecho pulse sequences of the brain and surrounding structures were obtained without intravenous contrast. COMPARISON:  01/15/2022 CT FINDINGS: Brain: Diffusion imaging does not show any acute or subacute infarction or other cause of restricted diffusion. There is generalized brain atrophy, advanced for age. Old small vessel infarctions affect the central pons. Few old small vessel cerebellar infarctions. Cerebral hemispheres show old infarctions of the left basal ganglia and radiating white matter tracts. Mild chronic small-vessel ischemic change seen elsewhere. No large vessel territory infarction. No mass, hemorrhage, hydrocephalus or extra-axial collection. Vascular: Major vessels at the base of the brain show flow. Skull and upper cervical spine: Negative Sinuses/Orbits: Clear/normal Other: None IMPRESSION: No acute MR finding. Accelerated generalized atrophy. Old small vessel infarctions of the pons,  cerebellum, left basal ganglia/radiating white matter tracts, and small-vessel change of the cerebral hemispheric white matter. Electronically Signed   By: Nelson Chimes M.D.   On: 01/16/2022 16:12   DG Hand 2 View Left  Result Date: 01/16/2022 CLINICAL DATA:  Pain and swelling after falling from bed. EXAM: LEFT HAND - 2 VIEW COMPARISON:  None Available. FINDINGS: Generalized regional swelling. No evidence fracture or dislocation. Small erosions are noted at several of the inter phalangeal joints which could be seen with erosive arthritis including erosive osteoarthritis. IMPRESSION: No acute finding other than soft tissue swelling. 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 Electronically Signed   By: Nelson Chimes M.D.   On: 01/16/2022 15:07   DG Chest 1 View  Result Date: 01/16/2022 CLINICAL DATA:  Sepsis. EXAM: CHEST  1 VIEW COMPARISON:  01/15/2022 FINDINGS: 0505 hours. The cardio pericardial silhouette is enlarged. Low volume film. Bibasilar atelectasis noted. There is pulmonary vascular congestion without overt pulmonary edema. Telemetry leads overlie the chest. IMPRESSION: Enlarged cardiopericardial silhouette with vascular congestion. Electronically Signed   By: Misty Stanley M.D.   On: 01/16/2022 06:00   DG Knee Complete 4 Views Right  Result Date: 01/15/2022 CLINICAL DATA:  Fall, pain and swelling, initial encounter. EXAM: RIGHT KNEE - COMPLETE 4+ VIEW COMPARISON:  None Available. FINDINGS: Large joint effusion. No fracture. Slight varus angulation of the knee joint with minimal lateral subluxation of the proximal tibia with respect to the distal femur. Marked loss of joint space in the medial compartment with subchondral sclerosis and osteophytosis. Additional osteophytosis in the medial and patellofemoral compartments. IMPRESSION: 1. Moderate to large joint effusion.  No fracture. 2. 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  Electronically Signed   By: Lorin Picket M.D.   On: 01/15/2022 13:53  CT Head Wo Contrast  Result Date: 01/15/2022 CLINICAL DATA:  Fall off a bed on Friday. EXAM: CT HEAD WITHOUT CONTRAST TECHNIQUE: Contiguous axial images were obtained from the base of the skull through the vertex without intravenous contrast. RADIATION DOSE REDUCTION: This exam was performed according to the departmental dose-optimization program which includes automated exposure control, adjustment of the mA and/or kV according to patient size and/or use of iterative reconstruction technique. COMPARISON:  None Available. FINDINGS: Brain: There is no acute intracranial hemorrhage, extra-axial fluid collection, or acute infarct. There is mild volume loss primarily affecting the frontal lobes. The ventricles are normal in size. Gray-white differentiation is preserved. There is no mass lesion.  There is no mass effect or midline shift. Vascular: There is calcification of the bilateral cavernous ICAs. Skull: Normal. Negative for fracture or focal lesion. Sinuses/Orbits: The paranasal sinuses are clear. The globes and orbits are unremarkable. Other: None. IMPRESSION: No acute intracranial pathology. Electronically Signed   By: Valetta Mole M.D.   On: 01/15/2022 12:30    Assessment/Plan  AKI on CKD V: In the setting of septic arthritis with confusion--> uremia.  I think this is the sentinel event that pushes him over into ESRD.  D/w pt and son- will need to start HD this admission.    - AVF will need to heal before it can be used--> will c/s IR for [facility], appreciate assistance  - blood cultures are negative   - writing HD orders  2.  L knee septic arthritis  - s/p arthrocentesis with ortho, appreciate assistance  - on vanc/ cefepime/ solumedrol  3.  Hyperkalemia  - Lokelma TID until dialysis  4.  Hyponatremia:  - volume- related, HD will correct this  5.  Elevated LFTs:  - marked elevation in the last 2 days  - stop  atorvastatin  - RUQ [place]  6.  Dispo: inpt  Linus Weckerly 01/17/2022, 11:05 AM

## 2022-01-18 ENCOUNTER — Inpatient Hospital Stay (HOSPITAL_COMMUNITY): Payer: Medicare Other

## 2022-01-18 DIAGNOSIS — G9341 Metabolic encephalopathy: Secondary | ICD-10-CM | POA: Diagnosis not present

## 2022-01-18 DIAGNOSIS — A419 Sepsis, unspecified organism: Secondary | ICD-10-CM | POA: Diagnosis not present

## 2022-01-18 DIAGNOSIS — G9340 Encephalopathy, unspecified: Secondary | ICD-10-CM | POA: Diagnosis not present

## 2022-01-18 DIAGNOSIS — R4182 Altered mental status, unspecified: Secondary | ICD-10-CM | POA: Diagnosis not present

## 2022-01-18 HISTORY — PX: IR US GUIDE VASC ACCESS RIGHT: IMG2390

## 2022-01-18 HISTORY — PX: IR FLUORO GUIDE CV LINE RIGHT: IMG2283

## 2022-01-18 LAB — RENAL FUNCTION PANEL
Albumin: 1.5 g/dL — ABNORMAL LOW (ref 3.5–5.0)
Anion gap: 19 — ABNORMAL HIGH (ref 5–15)
BUN: 170 mg/dL — ABNORMAL HIGH (ref 8–23)
CO2: 17 mmol/L — ABNORMAL LOW (ref 22–32)
Calcium: 7.1 mg/dL — ABNORMAL LOW (ref 8.9–10.3)
Chloride: 93 mmol/L — ABNORMAL LOW (ref 98–111)
Creatinine, Ser: 9.54 mg/dL — ABNORMAL HIGH (ref 0.61–1.24)
GFR, Estimated: 5 mL/min — ABNORMAL LOW (ref >60)
Glucose, Bld: 360 mg/dL — ABNORMAL HIGH (ref 70–99)
Phosphorus: 10.1 mg/dL — ABNORMAL HIGH (ref 2.5–4.6)
Potassium: 4.8 mmol/L (ref 3.5–5.1)
Sodium: 129 mmol/L — ABNORMAL LOW (ref 135–145)

## 2022-01-18 LAB — BODY FLUID CULTURE W GRAM STAIN: Culture: NO GROWTH

## 2022-01-18 LAB — HEPATIC FUNCTION PANEL
ALT: 132 U/L — ABNORMAL HIGH (ref 0–44)
AST: 456 U/L — ABNORMAL HIGH (ref 15–41)
Albumin: 1.6 g/dL — ABNORMAL LOW (ref 3.5–5.0)
Alkaline Phosphatase: 125 U/L (ref 38–126)
Bilirubin, Direct: 0.2 mg/dL (ref 0.0–0.2)
Indirect Bilirubin: 0.5 mg/dL (ref 0.3–0.9)
Total Bilirubin: 0.7 mg/dL (ref 0.3–1.2)
Total Protein: 6 g/dL — ABNORMAL LOW (ref 6.5–8.1)

## 2022-01-18 LAB — CBC
HCT: 24.6 % — ABNORMAL LOW (ref 39.0–52.0)
Hemoglobin: 8.1 g/dL — ABNORMAL LOW (ref 13.0–17.0)
MCH: 29.2 pg (ref 26.0–34.0)
MCHC: 32.9 g/dL (ref 30.0–36.0)
MCV: 88.8 fL (ref 80.0–100.0)
Platelets: 151 10*3/uL (ref 150–400)
RBC: 2.77 MIL/uL — ABNORMAL LOW (ref 4.22–5.81)
RDW: 14.3 % (ref 11.5–15.5)
WBC: 16.5 10*3/uL — ABNORMAL HIGH (ref 4.0–10.5)
nRBC: 0.1 % (ref 0.0–0.2)

## 2022-01-18 LAB — GLUCOSE, CAPILLARY
Glucose-Capillary: 313 mg/dL — ABNORMAL HIGH (ref 70–99)
Glucose-Capillary: 342 mg/dL — ABNORMAL HIGH (ref 70–99)
Glucose-Capillary: 317 mg/dL — ABNORMAL HIGH (ref 70–99)
Glucose-Capillary: 340 mg/dL — ABNORMAL HIGH (ref 70–99)

## 2022-01-18 MED ORDER — ANTICOAGULANT SODIUM CITRATE 4% (200MG/5ML) IV SOLN
5.0000 mL | Status: DC | PRN
Start: 2022-01-18 — End: 2022-01-19

## 2022-01-18 MED ORDER — ALTEPLASE 2 MG IJ SOLR: 2.0000 mg | Freq: Once | INTRAMUSCULAR | Status: DC | PRN

## 2022-01-18 MED ORDER — HEPARIN SODIUM (PORCINE) 1000 UNIT/ML IJ SOLN
INTRAMUSCULAR | Status: AC
  Administered 2022-01-18: 2800 [IU]
  Filled 2022-01-18: qty 10

## 2022-01-18 MED ORDER — SODIUM CHLORIDE 0.9% FLUSH
10.0000 mL | INTRAVENOUS | Status: DC | PRN
  Administered 2022-01-23: 10 mL

## 2022-01-18 MED ORDER — HEPARIN SODIUM (PORCINE) 1000 UNIT/ML DIALYSIS: 1000.0000 [IU] | INTRAMUSCULAR | Status: DC | PRN

## 2022-01-18 MED ORDER — "THROMBI-PAD 3""X3"" EX PADS"
1.0000 | MEDICATED_PAD | Freq: Once | CUTANEOUS | Status: AC
  Administered 2022-01-18: 1 via TOPICAL
  Filled 2022-01-18 (×2): qty 1

## 2022-01-18 MED ORDER — INSULIN ASPART 100 UNIT/ML IJ SOLN
8.0000 [IU] | Freq: Three times a day (TID) | INTRAMUSCULAR | Status: DC
  Administered 2022-01-18 – 2022-01-20 (×5): 8 [IU] via SUBCUTANEOUS

## 2022-01-18 MED ORDER — SODIUM CHLORIDE 0.9% FLUSH
10.0000 mL | Freq: Two times a day (BID) | INTRAVENOUS | Status: DC
  Administered 2022-01-18 – 2022-02-02 (×22): 10 mL

## 2022-01-18 MED ORDER — LIDOCAINE-EPINEPHRINE 1 %-1:100000 IJ SOLN
INTRAMUSCULAR | Status: AC
  Administered 2022-01-18: 10 mL
  Filled 2022-01-18: qty 1

## 2022-01-18 MED ORDER — INSULIN DETEMIR 100 UNIT/ML ~~LOC~~ SOLN
30.0000 [IU] | Freq: Two times a day (BID) | SUBCUTANEOUS | Status: DC
Start: 2022-01-18 — End: 2022-01-22
  Administered 2022-01-18 – 2022-01-21 (×7): 30 [IU] via SUBCUTANEOUS
  Filled 2022-01-18 (×9): qty 0.3

## 2022-01-18 NOTE — NC FL2 (Signed)
[place] LEVEL OF CARE SCREENING TOOL     IDENTIFICATION  Patient Name: [name] Birthdate: September 13, 1955 Sex: male Admission Date (Current Location): 01/15/2022  [facility] Main and [place] Number:  Herbalist and Address:  [facility], [place] [address] [place], [place] [address]      Provider Number: [id number]  Attending Physician Name and Address:  Jonetta Osgood, MD  Relative Name and Phone Number:       Current Level of Care: Hospital Recommended Level of Care: [place] Prior Approval Number:    Date Approved/Denied:   PASRR Number: [id number]  Discharge Plan: SNF    Current Diagnoses: Patient Active Problem List   Diagnosis Date Noted   SIRS (systemic inflammatory response syndrome) ([place]) 91/69/4503   Acute metabolic encephalopathy 88/82/8003   Hypoglycemia associated with type 2 diabetes mellitus ([place]) 01/16/2022   Gout flare 01/16/2022   Left knee pain 01/16/2022   Current chronic use of systemic steroids 01/16/2022   Prostate cancer ([place]) 01/16/2022   Left arm weakness 01/16/2022   Abnormal CXR 01/16/2022   Sepsis ([place]) 01/15/2022   Altered mental status    Elevated prostate specific antigen (PSA) 05/16/2021   Pain due to onychomycosis of toenails of both feet 03/06/2019   Hyperparathyroidism, 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   Combined forms of age-related cataract of both eyes 10/18/2016   Vitreous hemorrhage of right eye ([place]) 10/18/2016   History of vitrectomy 08/19/2014   Healthcare maintenance 05/31/2014   Swelling of left hand 05/24/2014   CKD (chronic kidney disease) stage 5, GFR less than 15 ml/min (HCC) 01/30/2012   Morbid obesity ([place]) 10/22/2011   Nuclear cataract 10/11/2011   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/12/2011   Proliferative diabetic retinopathy of both eyes with macular edema associated with type 2 diabetes mellitus ([place]) 04/12/2011   Anemia 12/26/2010   AKI (acute  kidney injury) ([place]) 12/26/2010   Obesity (BMI 30-39.9) 03/29/2009   Osteoarthritis 03/16/2009   DIABETIC  RETINOPATHY 02/06/2007   Hyperlipidemia LDL goal <100 06/08/2006   PAROXYSMAL ATRIAL TACHYCARDIA 06/08/2006   GERD 06/08/2006   Type II diabetes mellitus with complication, uncontrolled 04/22/2006   Essential hypertension 04/22/2006    Orientation RESPIRATION BLADDER Height & Weight     Self, Time, Situation, Place  O2 (3L Nasal Cannula) Incontinent, External catheter Weight: 272 lb 7.8 oz (123.6 kg) Height:  '5\' 11"'$  (180.3 cm)  BEHAVIORAL SYMPTOMS/MOOD NEUROLOGICAL BOWEL NUTRITION STATUS      Incontinent Diet (See discharge summary.)  AMBULATORY STATUS COMMUNICATION OF NEEDS Skin   Extensive Assist Verbally Surgical wounds                       Personal Care Assistance Level of Assistance  Bathing, Dressing, Feeding Bathing Assistance: Maximum assistance Feeding assistance: Maximum assistance Dressing Assistance: Maximum assistance     Functional Limitations Info  Sight, Hearing Sight Info: Impaired Hearing Info: Adequate      SPECIAL CARE FACTORS FREQUENCY  PT (By licensed PT), OT (By licensed OT)     PT Frequency: 5x/week OT Frequency: 5x/week            Contractures Contractures Info: Not present    Additional Factors Info  Code Status, Allergies, Insulin Sliding Scale Code Status Info: Full Code Allergies Info: No known allergies.   Insulin Sliding Scale Info: Insulin aspart 0-9 units 3x/day with meals, insulin aspart 8 units 3x/day with meals, insulin detemir injection  30 units 2x/day       Current Medications (01/18/2022):  This is the current hospital active medication list Current Facility-Administered Medications  Medication Dose Route Frequency Provider Last Rate Last Admin   0.9 %  sodium chloride infusion  250 mL Intravenous PRN Levie Heritage, MD       0.9 %  sodium chloride infusion  250 mL Intravenous PRN Wynetta Fines T, MD        acetaminophen (TYLENOL) tablet 650 mg  650 mg Oral Q4H PRN Lequita Halt, MD   650 mg at 01/17/22 1747   aspirin EC tablet 81 mg  81 mg Oral Daily Wynetta Fines T, MD   81 mg at 01/18/22 1059   calcitRIOL (ROCALTROL) capsule 0.25 mcg  0.25 mcg Oral Daily Wynetta Fines T, MD   0.25 mcg at 01/18/22 1059   Chlorhexidine Gluconate Cloth 2 % PADS 6 each  6 each Topical Q0600 Madelon Lips, MD   6 each at 01/18/22 0630   heparin injection 5,000 Units  5,000 Units Subcutaneous Q12H Wynetta Fines T, MD   5,000 Units at 01/18/22 1059   hydrALAZINE (APRESOLINE) injection 5 mg  5 mg Intravenous Q6H PRN Wynetta Fines T, MD       HYDROmorphone (DILAUDID) injection 0.5 mg  0.5 mg Intravenous Q4H PRN Wynetta Fines T, MD   0.5 mg at 01/17/22 0802   insulin aspart (novoLOG) injection 0-9 Units  0-9 Units Subcutaneous TID WC Wynetta Fines T, MD   7 Units at 01/18/22 1253   insulin aspart (novoLOG) injection 8 Units  8 Units Subcutaneous TID WC Ghimire, Henreitta Leber, MD       insulin detemir (LEVEMIR) injection 30 Units  30 Units Subcutaneous BID Ghimire, Henreitta Leber, MD       lidocaine (PF) (XYLOCAINE) 1 % injection 5 mL  5 mL Intradermal PRN Madelon Lips, MD       lidocaine-prilocaine (EMLA) cream 1 Application  1 Application Topical PRN Madelon Lips, MD       methylPREDNISolone sodium succinate (SOLU-MEDROL) 40 mg/mL injection 40 mg  40 mg Intravenous Q1200 Elodia Florence., MD   40 mg at 01/18/22 1323   ondansetron (ZOFRAN) injection 4 mg  4 mg Intravenous Q6H PRN Wynetta Fines T, MD   4 mg at 01/16/22 0413   pantoprazole (PROTONIX) injection 40 mg  40 mg Intravenous Q12H Wynetta Fines T, MD   40 mg at 01/18/22 1059   pentafluoroprop-tetrafluoroeth (GEBAUERS) aerosol 1 Application  1 Application Topical PRN Madelon Lips, MD       sodium chloride flush (NS) 0.9 % injection 3 mL  3 mL Intravenous Q12H Levie Heritage, MD   3 mL at 01/18/22 1015   sodium chloride flush (NS) 0.9 % injection 3 mL  3 mL Intravenous PRN  Levie Heritage, MD       sodium chloride flush (NS) 0.9 % injection 3 mL  3 mL Intravenous Q12H Wynetta Fines T, MD   3 mL at 01/18/22 1015   sodium chloride flush (NS) 0.9 % injection 3 mL  3 mL Intravenous PRN Wynetta Fines T, MD       sodium zirconium cyclosilicate (LOKELMA) packet 10 g  10 g Oral TID Jonetta Osgood, MD   10 g at 01/18/22 1059   Thrombi-Pad 3"X3" pad 1 each  1 each Topical Once Madelon Lips, MD       vancomycin variable dose per unstable renal function (pharmacist dosing)   Does  not apply See admin instructions Ventura Sellers, [facility]         Discharge Medications: Please see discharge summary for a list of discharge medications.  Relevant Imaging Results:  Relevant Lab Results:   Additional Information SSN: [id number], Will need transportation to outpatient dialysis, schedule/location not yet set up.  Benard Halsted, LCSW

## 2022-01-18 NOTE — Progress Notes (Signed)
At [facility] saw and assessed patient's HD insertion site and applied thrombipad. Please see documentation. Due to the continued bleeding. VAST recommended to contact MD. Secure chat was sent and Sherlynn Stalls RN acknowledged. Fran Lowes, RN VAST

## 2022-01-18 NOTE — Progress Notes (Signed)
Requested to see pt for out-pt HD needs at d/c. Met with pt and pt's son at bedside. Introduced self and explained role. Pt prefers [facility] which is close to pt's home. However, it appears that pt is for snf placement at d/c. Clinic placement will need to be determined based on which clinic snf will transport pt to while pt receiving rehab. Will assist as needed.   Melven Sartorius Renal Navigator [phone]

## 2022-01-18 NOTE — Inpatient Diabetes Management (Signed)
Inpatient Diabetes Program Recommendations  AACE/ADA: New Consensus Statement on Inpatient Glycemic Control (2015)  Target Ranges:  Prepandial:   less than 140 mg/dL      Peak postprandial:   less than 180 mg/dL (1-2 hours)      Critically ill patients:  140 - 180 mg/dL   Lab Results  Component Value Date   GLUCAP 313 (H) 01/18/2022   HGBA1C 9.6 (H) 01/15/2022    Review of Glycemic Control  Latest Reference Range & Units 01/16/22 08:05 01/16/22 11:21 01/16/22 16:43 01/16/22 21:11 01/17/22 07:57  Glucose-Capillary 70 - 99 mg/dL 246 (H) 227 (H) 265 (H) 296 (H) 345 (H)   Diabetes history: DM 2 Outpatient Diabetes medications: Glipizide 10 mg bid, Levemir 40 units bid Current orders for Inpatient glycemic control:  Levemir 20 units bid Novolog 0-9 units tid  Novolog 4 units tid meal coverage  Solumedrol 40 mg Daily  Inpatient Diabetes Program Recommendations:    -   Increase Levemir to 30 units bid -   Add Novolog hs scale -   Increase meal coverage to Novolog 6 units tid meal coverage if eating >50% of meals.  Thanks,  Tama Headings RN, MSN, BC-ADM Inpatient Diabetes Coordinator Team Pager [phone] (8a-5p)

## 2022-01-18 NOTE — Progress Notes (Signed)
IV team to bedside to assess bleeding IJ. Site bleeding on assessment, dressing soiled with blood, thrombi pad saturated. Dressing changed, site cleaned, new biopatch placed, new thrombi pad placed, site intact.

## 2022-01-18 NOTE — Progress Notes (Signed)
PROGRESS NOTE        PATIENT DETAILS Name: [name] Age: 66 y.o. Sex: male Date of Birth: 02-Jan-1956 Admit Date: 01/15/2022 Admitting Physician Lequita Halt, MD TML:[id number], Malva Limes., FNP  Brief Summary: Patient is a 66 y.o.  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  See below for further details.  Significant events: 8/14>> admit for confusion/gouty arthritis/progression to ESRD. 8/16>> worsening transaminases-renal planning HD. 8/17>> TDC placed-first HD planned.  Significant studies: 8/15>> MRI brain: No acute CVA. 8/15>> renal ultrasound: No hydronephrosis. 8/14>> left knee synovial fluid: WBC 23,835, extracellular sodium urate crystals. 8/15>> right knee synovial fluid: WBC 15,600, intracellular monosodium urate crystals. 8/16>> Echo: EF 55-60% 8/16>> RUQ ultrasound: No major abnormalities noted.  Significant microbiology data: 8/14>> blood culture: No growth 8/14>> left knee synovial fluid: No growth 8/15>> right knee synovial fluid: No growth  Procedures: 8/14>> left knee arthrocentesis 8/15>> right knee arthrocentesis 8/17>> tunneled HD catheter placement by IR.  Consults: Nephrology. Orthopedics  Subjective: Lying comfortably in bed-although awake and alert-still remains slow and slightly lethargic.  Claims improved pain in knees  Objective: Vitals: Blood pressure (!) 142/70, pulse 71, temperature 97.6 F (36.4 C), temperature source Oral, resp. rate 18, height 5' 11"  (1.803 m), weight 123.6 kg, SpO2 98 %.   Exam: Gen Exam: Slightly slow but not in any distress-still awake and alert. HEENT:atraumatic, normocephalic Chest: B/L clear to auscultation anteriorly CVS:S1S2 regular Abdomen:soft non tender, non  distended Extremities:no edema Neurology: Has some mild weakness in left arm grip (chronic per son)-but otherwise nonfocal. Skin: no rash   Pertinent Labs/Radiology:    Latest Ref Rng & Units 01/18/2022    3:39 AM 01/17/2022    4:41 AM 01/16/2022    5:58 PM  CBC  WBC 4.0 - 10.5 K/uL 16.5  15.3    Hemoglobin 13.0 - 17.0 g/dL 8.1  8.1  8.3   Hematocrit 39.0 - 52.0 % 24.6  25.9  25.9   Platelets 150 - 400 K/uL 151  163      Lab Results  Component Value Date   NA 129 (L) 01/18/2022   K 4.8 01/18/2022   CL 93 (L) 01/18/2022   CO2 17 (L) 01/18/2022       Assessment/Plan: SIRS due to acute gouty arthritis involving bilateral knees, numerous joints of left hand: Bilateral knee arthralgias/left hand arthralgias better-synovial fluid cultures negative so far-on IV steroids-off all IV antibiotics as culture data continues to be negative.  Unclear if some arthralgias is from uremia as well.  Plan is to continue steroids-we will be started on HD today-we will follow clinical course.    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    Hyperkalemia: Due to worsening renal function-better with Lokelma.  HD planned later today.  Acute metabolic encephalopathy:  due to uremia-continues to be lethargic-hoping that mentation would improve following HD.  Per patient's son-mentation is better than how he first presented.  Normocytic anemia: Due to acute illness/CKD-although FOBT positive-no evidence of overt blood loss.  Defer Aranesp/iron therapy to nephrology service.  Transaminitis: Unclear etiology-suspect probably medication related-no longer on cefepime and statin-Zytiga on hold.  LFTs are starting to downtrend-we will follow-RUQ ultrasound unremarkable.  Follow LFTs.  Hyponatremia: Related to volume-should improve post HD.  Hypoglycemia: Likely related to  use of oral hypoglycemic agents in the setting of worsening renal function.  Continue to watch closely.  DM-2 (A1c 9.6 on 8/14) with uncontrolled hyperglycemia due to steroids: Increase Levemir to 30 units twice daily, increase Premeal NovoLog to 8 units-continue SSI and follow.   Will need to permanently stop glipizide and metformin on discharge.  Recent Labs    01/17/22 2201 01/18/22 0814 01/18/22 1153  GLUCAP 337* 313* 342*     Chronic left arm weakness: Per patient's son-this has been ongoing for the past several months-son has noted a problem with the patient's grip mostly.  MRI brain negative for acute CVA.  We will need to check a MRI C-spine at some point.  Suspect some of it may be due to acute gouty arthritis involving his small hand joints as well.  Prostate cancer: On prednisone/Zytiga-currently on IV Solu-Medrol for gouty flare-Zytiga will remain on hold due to elevated liver function enzymes  Morbid Obesity: Estimated body mass index is 38 kg/m as calculated from the following:   Height as of this encounter: 5' 11"  (1.803 m).   Weight as of this encounter: 123.6 kg.   Code status:   Code Status: Full Code   DVT Prophylaxis: heparin injection 5,000 Units Start: 01/15/22 1330   Family Communication: Son at bedside   Disposition Plan: Status is: Inpatient Remains inpatient appropriate because: Likely progression to ESRD-nephrology initiating dialysis-not yet stable for discharge.   Planned Discharge Destination:SNF  Diet: Diet Order             Diet Carb Modified Fluid consistency: Thin; Room service appropriate? Yes  Diet effective now                     Antimicrobial agents: Anti-infectives (From admission, onward)    Start     Dose/Rate Route Frequency Ordered Stop   01/16/22 1000  ceFEPIme (MAXIPIME) 2 g in sodium chloride 0.9 % 100 mL IVPB  Status:  Discontinued        2 g 200 mL/hr over 30 Minutes Intravenous Every 24 hours 01/15/22 1014  01/17/22 1355   01/15/22 1015  ceFEPIme (MAXIPIME) 2 g in sodium chloride 0.9 % 100 mL IVPB        2 g 200 mL/hr over 30 Minutes Intravenous  Once 01/15/22 1005 01/15/22 1127   01/15/22 1015  metroNIDAZOLE (FLAGYL) IVPB 500 mg        500 mg 100 mL/hr over 60 Minutes Intravenous  Once 01/15/22 1005 01/15/22 1213   01/15/22 1015  vancomycin (VANCOCIN) IVPB 1000 mg/200 mL premix  Status:  Discontinued        1,000 mg 200 mL/hr over 60 Minutes Intravenous  Once 01/15/22 1005 01/15/22 1013   01/15/22 1015  vancomycin (VANCOREADY) IVPB 2000 mg/400 mL        2,000 mg 200 mL/hr over 120 Minutes Intravenous  Once 01/15/22 1013 01/15/22 1325   01/15/22 1011  vancomycin variable dose per unstable renal function (pharmacist dosing)         Does not apply See admin instructions 01/15/22 1011          MEDICATIONS: Scheduled Meds:  aspirin EC  81 mg Oral Daily   calcitRIOL  0.25 mcg Oral Daily  Chlorhexidine Gluconate Cloth  6 each Topical Q0600   heparin  5,000 Units Subcutaneous Q12H   insulin aspart  0-9 Units Subcutaneous TID WC   insulin aspart  4 Units Subcutaneous TID WC   insulin detemir  25 Units Subcutaneous BID   methylPREDNISolone (SOLU-MEDROL) injection  40 mg Intravenous Q1200   pantoprazole (PROTONIX) IV  40 mg Intravenous Q12H   sodium chloride flush  3 mL Intravenous Q12H   sodium chloride flush  3 mL Intravenous Q12H   sodium zirconium cyclosilicate  10 g Oral TID   vancomycin variable dose per unstable renal function (pharmacist dosing)   Does not apply See admin instructions   Continuous Infusions:  sodium chloride     sodium chloride     PRN Meds:.sodium chloride, sodium chloride, acetaminophen, hydrALAZINE, HYDROmorphone (DILAUDID) injection, lidocaine (PF), lidocaine-prilocaine, ondansetron (ZOFRAN) IV, pentafluoroprop-tetrafluoroeth, sodium chloride flush, sodium chloride flush   I have personally reviewed following labs and imaging studies  LABORATORY  DATA: CBC: Recent Labs  Lab 01/15/22 0928 01/15/22 1017 01/15/22 1028 01/16/22 0410 01/16/22 1758 01/17/22 0441 01/18/22 0339  WBC 13.3*  --  14.5* 15.1*  --  15.3* 16.5*  NEUTROABS  --   --  11.4*  --   --  14.0*  --   HGB 9.3*   < > 9.3* 7.8* 8.3* 8.1* 8.1*  HCT 29.8*   < > 29.4* 25.2* 25.9* 25.9* 24.6*  MCV 93.1  --  91.9 93.3  --  92.8 88.8  PLT 174  --  192 169  --  163 151   < > = values in this interval not displayed.     Basic Metabolic Panel: Recent Labs  Lab 01/15/22 0928 01/15/22 1017 01/15/22 1323 01/16/22 0410 01/17/22 0441 01/17/22 1005 01/18/22 0339  NA 139 138  --  135 132* 129* 129*  K 3.7 3.6  --  5.3* 5.8* 5.4* 4.8  CL 103 101  --  98 95* 93* 93*  CO2 22  --   --  21* 18* 16* 17*  GLUCOSE 104* 101*  --  253* 367* 360* 360*  BUN 102* 108*  --  116* 145* 149* 170*  CREATININE 5.68* 6.30*  --  6.85* 8.49* 8.64* 9.54*  CALCIUM 7.6*  --   --  7.9* 7.4* 7.5* 7.1*  MG  --   --  1.9  --  2.4  --   --   PHOS  --   --  4.9*  --  9.7* 9.7* 10.1*     GFR: Estimated Creatinine Clearance: 9.9 mL/min (A) (by C-G formula based on SCr of 9.54 mg/dL (H)).  Liver Function Tests: Recent Labs  Lab 01/15/22 0928 01/17/22 0441 01/17/22 1005 01/18/22 0339 01/18/22 1042  AST 80* 510*  --   --  456*  ALT 24 123*  --   --  132*  ALKPHOS 123 133*  --   --  125  BILITOT 1.0 1.3*  --   --  0.7  PROT 6.1* 6.4*  --   --  6.0*  ALBUMIN 1.7* 1.7* 1.6* 1.5* 1.6*    No results for input(s): "LIPASE", "AMYLASE" in the last 168 hours. Recent Labs  Lab 01/15/22 1026  AMMONIA 27     Coagulation Profile: Recent Labs  Lab 01/15/22 1028  INR 1.3*     Cardiac Enzymes: No results for input(s): "CKTOTAL", "CKMB", "CKMBINDEX", "TROPONINI" in the last 168 hours.  BNP (last 3 results) No results for input(s): "PROBNP"  in the last 8760 hours.  Lipid Profile: No results for input(s): "CHOL", "HDL", "LDLCALC", "TRIG", "CHOLHDL", "LDLDIRECT" in the last 72  hours.  Thyroid Function Tests: No results for input(s): "TSH", "T4TOTAL", "FREET4", "T3FREE", "THYROIDAB" in the last 72 hours.   Anemia Panel: Recent Labs    01/15/22 1323  FERRITIN 381*  TIBC 171*  IRON 13*     Urine analysis:    Component Value Date/Time   COLORURINE YELLOW 05/04/2008 2129   APPEARANCEUR Cloudy (A) 08/19/2015 1403   LABSPEC 1.012 05/04/2008 2129   PHURINE 5.5 05/04/2008 2129   GLUCOSEU Negative 08/19/2015 1403   GLUCOSEU 500 (A) 05/04/2008 2129   HGBUR SMALL (A) 04/22/2008 0848   BILIRUBINUR Negative 08/19/2015 1403   KETONESUR NEG mg/dL 05/04/2008 2129   PROTEINUR 1+ (A) 08/19/2015 1403   PROTEINUR 30 (A) 05/04/2008 2129   UROBILINOGEN 0.2 05/04/2008 2129   NITRITE Negative 08/19/2015 1403   NITRITE NEG 05/04/2008 2129   LEUKOCYTESUR 1+ (A) 08/19/2015 1403    Sepsis Labs: Lactic Acid, Venous    Component Value Date/Time   LATICACIDVEN 2.6 (HH) 01/15/2022 1026    MICROBIOLOGY: Recent Results (from the past 240 hour(s))  Blood Culture (routine x 2)     Status: None (Preliminary result)   Collection Time: 01/15/22 10:04 AM   Specimen: BLOOD RIGHT FOREARM  Result Value Ref Range Status   Specimen Description BLOOD RIGHT FOREARM  Final   Special Requests   Final    BOTTLES DRAWN AEROBIC AND ANAEROBIC Blood Culture adequate volume   Culture   Final    NO GROWTH 3 DAYS Performed at [facility], [place] [address] [place], [place] [address]    Report Status PENDING  Incomplete  Resp Panel by RT-PCR (Flu A&B, Covid) Anterior Nasal Swab     Status: None   Collection Time: 01/15/22 10:05 AM   Specimen: Anterior Nasal Swab  Result Value Ref Range Status   SARS Coronavirus 2 by RT PCR NEGATIVE NEGATIVE Final    Comment: (NOTE) SARS-CoV-2 target nucleic acids are NOT DETECTED.  The SARS-CoV-2 RNA is generally detectable in upper respiratory specimens during the acute phase of infection. The lowest concentration of SARS-CoV-2 viral copies  this assay can detect is 138 copies/mL. A negative result does not preclude SARS-Cov-2 infection and should not be used as the sole basis for treatment or other patient management decisions. A negative result may occur with  improper specimen collection/handling, submission of specimen other than nasopharyngeal swab, presence of viral mutation(s) within the areas targeted by this assay, and inadequate number of viral copies(<138 copies/mL). A negative result must be combined with clinical observations, patient history, and epidemiological information. The expected result is Negative.  Fact Sheet for Patients:  EntrepreneurPulse.com.au  Fact Sheet for Healthcare Providers:  IncredibleEmployment.be  This test is no t yet approved or cleared by the [place] FDA and  has been authorized for detection and/or diagnosis of SARS-CoV-2 by FDA under an Emergency Use Authorization (EUA). This EUA will remain  in effect (meaning this test can be used) for the duration of the COVID-19 declaration under Section 564(b)(1) of the Act, 21 U.S.C.section 360bbb-3(b)(1), unless the authorization is terminated  or revoked sooner.       Influenza A by PCR NEGATIVE NEGATIVE Final   Influenza B by PCR NEGATIVE NEGATIVE Final    Comment: (NOTE) The Xpert Xpress SARS-CoV-2/FLU/RSV plus assay is intended as an aid in the diagnosis of influenza from Nasopharyngeal swab  specimens and should not be used as a sole basis for treatment. Nasal washings and aspirates are unacceptable for Xpert Xpress SARS-CoV-2/FLU/RSV testing.  Fact Sheet for Patients: EntrepreneurPulse.com.au  Fact Sheet for Healthcare Providers: IncredibleEmployment.be  This test is not yet approved or cleared by the [place] FDA and has been authorized for detection and/or diagnosis of SARS-CoV-2 by FDA under an Emergency Use Authorization (EUA). This EUA  will remain in effect (meaning this test can be used) for the duration of the COVID-19 declaration under Section 564(b)(1) of the Act, 21 U.S.C. section 360bbb-3(b)(1), unless the authorization is terminated or revoked.  Performed at [facility], [place] [address], [place], [place] [address]   Blood Culture (routine x 2)     Status: None (Preliminary result)   Collection Time: 01/15/22 10:09 AM   Specimen: BLOOD  Result Value Ref Range Status   Specimen Description BLOOD SITE NOT SPECIFIED  Final   Special Requests   Final    BOTTLES DRAWN AEROBIC AND ANAEROBIC Blood Culture adequate volume   Culture   Final    NO GROWTH 3 DAYS Performed at [facility], [address] [place], [place] [address]    Report Status PENDING  Incomplete  MRSA Next Gen by PCR, Nasal     Status: None   Collection Time: 01/15/22  1:28 PM   Specimen: Nasal Mucosa; Nasal Swab  Result Value Ref Range Status   MRSA by PCR Next Gen NOT DETECTED NOT DETECTED Final    Comment: (NOTE) The GeneXpert MRSA Assay (FDA approved for NASAL specimens only), is one component of a comprehensive MRSA colonization surveillance program. It is not intended to diagnose MRSA infection nor to guide or monitor treatment for MRSA infections. Test performance is not FDA approved in patients less than 2 years old. Performed at [facility], [place] [address] [place], [place] [address]   Body fluid culture w Gram Stain     Status: None (Preliminary result)   Collection Time: 01/15/22  5:59 PM   Specimen: Synovial Fluid  Result Value Ref Range Status   Specimen Description FLUID SYNOVIAL KNEE LEFT  Final   Special Requests NONE  Final   Gram Stain   Final    ABUNDANT WBC PRESENT, PREDOMINANTLY PMN NO ORGANISMS SEEN    Culture   Final    NO GROWTH 2 DAYS Performed at [facility], [place] [address] [place], [place] [address]    Report Status PENDING  Incomplete  Anaerobic culture w Gram Stain      Status: None (Preliminary result)   Collection Time: 01/15/22  5:59 PM   Specimen: Synovial Fluid  Result Value Ref Range Status   Specimen Description FLUID SYNOVIAL KNEE LEFT  Final   Special Requests NONE  Final   Gram Stain   Final    ABUNDANT WBC PRESENT, PREDOMINANTLY PMN NO ORGANISMS SEEN Performed at [facility], [place] [address] [place], [place] [address]    Culture   Final    NO ANAEROBES ISOLATED; CULTURE IN PROGRESS FOR 5 DAYS   Report Status PENDING  Incomplete  Body fluid culture w Gram Stain     Status: None (Preliminary result)   Collection Time: 01/16/22 11:48 AM   Specimen: Synovium; Body Fluid  Result Value Ref Range Status   Specimen Description SYNOVIAL  Final   Special Requests  RIGHT KNEE  Final   Gram Stain   Final  ABUNDANT WBC PRESENT, PREDOMINANTLY PMN NO ORGANISMS SEEN    Culture   Final    NO GROWTH 2 DAYS Performed at [facility] [address] [place], [place] [address]    Report Status PENDING  Incomplete    RADIOLOGY STUDIES/RESULTS: IR Fluoro Guide CV Line Right  Result Date: 01/18/2022 INDICATION: 66 year old male with worsening end-stage renal failure requiring initiation of hemodialysis. Temporary hemodialysis catheter requested. EXAM: NON-TUNNELED CENTRAL VENOUS HEMODIALYSIS CATHETER PLACEMENT WITH ULTRASOUND AND FLUOROSCOPIC GUIDANCE COMPARISON:  None Available. MEDICATIONS: None FLUOROSCOPY TIME:  One mGy COMPLICATIONS: None immediate. PROCEDURE: Informed written consent was obtained from the patient after a discussion of the risks, benefits, and alternatives to treatment. Questions regarding the procedure were encouraged and answered. The right neck and chest were prepped with chlorhexidine in a sterile fashion, and a sterile drape was applied covering the operative field. Maximum barrier sterile technique with sterile gowns and gloves were used for the procedure. A timeout was performed prior to the initiation of the  procedure. After the overlying soft tissues were anesthetized, a small venotomy incision was created and a micropuncture kit was utilized to access the internal jugular vein. Real-time ultrasound guidance was utilized for vascular access including the acquisition of a permanent ultrasound image documenting patency of the accessed vessel. The microwire was utilized to measure appropriate catheter length. A Rosen wire was advanced to the level of the IVC. Under fluoroscopic guidance, the venotomy was serially dilated, ultimately allowing placement of a 20 cm temporary Trialysis catheter with tip ultimately terminating within the superior aspect of the right atrium. Final catheter positioning was confirmed and documented with a spot radiographic image. The catheter aspirates and flushes normally. The catheter was flushed with appropriate volume heparin dwells. The catheter exit site was secured with a 0 silk retention suture. A dressing was placed. The patient tolerated the procedure well without immediate post procedural complication. IMPRESSION: Successful placement of a right internal jugular approach 20 cm temporary dialysis catheter with tip terminating with in the superior aspect of the right atrium. The catheter is ready for immediate use. PLAN: This catheter may be converted to a tunneled dialysis catheter at a later date as indicated. Ruthann Cancer, MD [facility] Electronically Signed   By: Ruthann Cancer M.D.   On: 01/18/2022 10:04   US Abdomen Limited RUQ (LIVER/GB)  Result Date: 01/17/2022 CLINICAL DATA:  Elevated LFTs EXAM: ULTRASOUND ABDOMEN LIMITED RIGHT UPPER QUADRANT COMPARISON:  None Available. FINDINGS: Gallbladder: No gallstones or wall thickening visualized. No sonographic Murphy sign noted by sonographer. Common bile duct: Diameter: 3.7 mm Liver: No focal lesion identified. Within normal limits in parenchymal echogenicity. Portal vein  is patent on color Doppler imaging with normal direction of blood flow towards the liver. Other: None. IMPRESSION: Normal right upper quadrant ultrasound. Electronically Signed   By: Ronney Asters M.D.   On: 01/17/2022 19:30   ECHOCARDIOGRAM COMPLETE  Result Date: 01/17/2022    ECHOCARDIOGRAM REPORT   Patient Name:   JAMARIS THEARD Date of Exam: 01/17/2022 Medical Rec #:  [id number]      Height:       71.0 in Accession #:    [id number]     Weight:       272.9 lb Date of Birth:  01/27/1956       BSA:          2.406 m Patient Age:    17 years  BP:           123/65 mmHg Patient Gender: M              HR:           84 bpm. Exam Location:  Inpatient Procedure: 2D Echo, Cardiac Doppler, Color Doppler and Intracardiac            Opacification Agent Indications:    Abnormal CXR  History:        Patient has prior history of Echocardiogram examinations, most                 recent 04/20/2019. CHF; Risk Factors:Hypertension, Diabetes and                 Dyslipidemia.  Sonographer:    Jefferey Pica Referring Phys: [id number] A CALDWELL POWELL JR IMPRESSIONS  1. Left ventricular ejection fraction, by estimation, is 55 to 60%. The left ventricle has normal function. The left ventricle has no regional wall motion abnormalities. There is mild concentric left ventricular hypertrophy. Left ventricular diastolic parameters were normal.  2. Right ventricular systolic function is normal. The right ventricular size is normal. There is normal pulmonary artery systolic pressure.  3. The mitral valve is normal in structure. Trivial mitral valve regurgitation. No evidence of mitral stenosis.  4. The aortic valve is tricuspid. Aortic valve regurgitation is not visualized. No aortic stenosis is present.  5. The inferior vena cava is normal in size with greater than 50% respiratory variability, suggesting right atrial pressure of 3 mmHg. FINDINGS  Left Ventricle: Left ventricular ejection fraction, by estimation, is 55 to 60%. The left  ventricle has normal function. The left ventricle has no regional wall motion abnormalities. The left ventricular internal cavity size was normal in size. There is  mild concentric left ventricular hypertrophy. Left ventricular diastolic parameters were normal. Right Ventricle: The right ventricular size is normal. No increase in right ventricular wall thickness. Right ventricular systolic function is normal. There is normal pulmonary artery systolic pressure. The tricuspid regurgitant velocity is 2.74 m/s, and  with an assumed right atrial pressure of 5 mmHg, the estimated right ventricular systolic pressure is 32.9 mmHg. Left Atrium: Left atrial size was normal in size. Right Atrium: Right atrial size was normal in size. Pericardium: There is no evidence of pericardial effusion. Mitral Valve: The mitral valve is normal in structure. Trivial mitral valve regurgitation. No evidence of mitral valve stenosis. Tricuspid Valve: The tricuspid valve is normal in structure. Tricuspid valve regurgitation is trivial. No evidence of tricuspid stenosis. Aortic Valve: The aortic valve is tricuspid. Aortic valve regurgitation is not visualized. No aortic stenosis is present. Aortic valve peak gradient measures 8.9 mmHg. Pulmonic Valve: The pulmonic valve was not well visualized. Pulmonic valve regurgitation is not visualized. No evidence of pulmonic stenosis. Aorta: The aortic root is normal in size and structure. Venous: The inferior vena cava is normal in size with greater than 50% respiratory variability, suggesting right atrial pressure of 3 mmHg. IAS/Shunts: No atrial level shunt detected by color flow Doppler.  LEFT VENTRICLE PLAX 2D LVIDd:         5.50 cm   Diastology LVIDs:         3.70 cm   LV e' medial:    7.49 cm/s LV PW:         1.20 cm   LV E/e' medial:  13.0 LV IVS:        1.20 cm   LV e'  lateral:   7.56 cm/s LVOT diam:     2.00 cm   LV E/e' lateral: 12.9 LV SV:         82 LV SV Index:   34 LVOT Area:     3.14 cm   RIGHT VENTRICLE             IVC RV S prime:     12.80 cm/s  IVC diam: 2.00 cm TAPSE (M-mode): 2.6 cm LEFT ATRIUM           Index        RIGHT ATRIUM           Index LA diam:      4.10 cm 1.70 cm/m   RA Area:     17.00 cm LA Vol (A4C): 68.8 ml 28.59 ml/m  RA Volume:   39.30 ml  16.33 ml/m  AORTIC VALVE                 PULMONIC VALVE AV Area (Vmax): 2.99 cm     PV Vmax:       0.89 m/s AV Vmax:        149.00 cm/s  PV Peak grad:  3.2 mmHg AV Peak Grad:   8.9 mmHg LVOT Vmax:      142.00 cm/s LVOT Vmean:     78.300 cm/s LVOT VTI:       0.260 m  AORTA Ao Root diam: 3.50 cm Ao Asc diam:  3.40 cm MITRAL VALVE               TRICUSPID VALVE MV Area (PHT): 7.09 cm    TR Peak grad:   30.0 mmHg MV Decel Time: 107 msec    TR Vmax:        274.00 cm/s MV E velocity: 97.30 cm/s MV A velocity: 47.70 cm/s  SHUNTS MV E/A ratio:  2.04        Systemic VTI:  0.26 m                            Systemic Diam: 2.00 cm Kardie Tobb DO Electronically signed by Berniece Salines DO Signature Date/Time: 01/17/2022/11:57:08 AM    Final    US RENAL  Result Date: 01/16/2022 CLINICAL DATA:  AKI. EXAM: RENAL / URINARY TRACT ULTRASOUND COMPLETE COMPARISON:  08/17/2021. FINDINGS: Right Kidney: Renal measurements: 10.5 x 5.4 x 4.8 cm = volume: 142.05 mL. Echogenicity within normal limits. No mass or hydronephrosis visualized. Left Kidney: Renal measurements: 10.3 x 5.9 x 4.6 cm = volume: 146.72 mL. Echogenicity within normal limits. An avascular hypoechoic region is noted in the mid left kidney measuring 2.5 x 1.7 cm, possible cyst. No mass or hydronephrosis visualized. Bladder: Appears normal for degree of bladder distention. Other: Examination is limited due to patient's body habitus and limited mobility. IMPRESSION: 1. Avascular hypoechoic structure in the mid left kidney, possible cyst. 2. Normal evaluation of the right kidney and bladder. Electronically Signed   By: Brett Fairy M.D.   On: 01/16/2022 20:16   MR BRAIN WO CONTRAST  Result Date:  01/16/2022 CLINICAL DATA:  Neuro deficit, acute, stroke suspected. EXAM: MRI HEAD WITHOUT CONTRAST TECHNIQUE: Multiplanar, multiecho pulse sequences of the brain and surrounding structures were obtained without intravenous contrast. COMPARISON:  01/15/2022 CT FINDINGS: Brain: Diffusion imaging does not show any acute or subacute infarction or other cause of restricted diffusion. There is generalized brain atrophy, advanced for age. Old small vessel  infarctions affect the central pons. Few old small vessel cerebellar infarctions. Cerebral hemispheres show old infarctions of the left basal ganglia and radiating white matter tracts. Mild chronic small-vessel ischemic change seen elsewhere. No large vessel territory infarction. No mass, hemorrhage, hydrocephalus or extra-axial collection. Vascular: Major vessels at the base of the brain show flow. Skull and upper cervical spine: Negative Sinuses/Orbits: Clear/normal Other: None IMPRESSION: No acute MR finding. Accelerated generalized atrophy. 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 Electronically Signed   By: Nelson Chimes M.D.   On: 01/16/2022 16:12   DG Hand 2 View Left  Result Date: 01/16/2022 CLINICAL DATA:  Pain and swelling after falling from bed. EXAM: LEFT HAND - 2 VIEW COMPARISON:  None Available. FINDINGS: Generalized regional swelling. No evidence fracture or dislocation. Small erosions are noted at several of the inter phalangeal joints which could be seen with erosive arthritis including erosive osteoarthritis. IMPRESSION: No acute finding other than soft tissue swelling. 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 Electronically Signed   By: Nelson Chimes M.D.   On: 01/16/2022 15:07     LOS: 3 days   Oren Binet, MD  [facility]    To contact the attending provider  between 7A-7P or the covering provider during after hours 7P-7A, please log into the web site [email or web address] and access using universal [place] password for that web site. If you do not have the password, please call the hospital operator.  01/18/2022, 12:23 PM

## 2022-01-18 NOTE — Progress Notes (Signed)
RN placed IV consult due to IJ site bleeding through dressing and reinforcement dressing. Thrombi pad placed per order and dressing changed. Junie Panning, Agricultural consultant notified of situation.

## 2022-01-18 NOTE — Evaluation (Signed)
Occupational Therapy Evaluation Patient Details Name: [name] MRN: [id number] DOB: 1955/06/29 Today's Date: 01/18/2022   History of Present Illness 66 yo male presents to ED on 8/14 with hypoglycemia, AMS, recent fall, POD6 L AV fistula placement. CTH negative, FOBT +. s/p R and L knee aspiration on 8/14 and 8/15, respectively. Workup for SIRS due to acute gouty arthritis bilat knees. PMH includes CKD stage V, DM-2 with retinopathy and neuropathy, chronic HFpEF, HTN, HLD, prostate cancer, cardiomyopathy.   Clinical Impression   Pt was living alone and independent in self care and light IADLs. He sometimes used a RW in the community. Limited evaluation today due to IJ catheter bleeding (RN managed) and incontinence of bowel. Pt requires set up to total assist for ADLs and mod assist with increased time to roll. Pt demonstrates slow processing speed and memory deficits. Supportive son at bedside. Pt will need post acute rehab in SNF prior to return home. Will follow acutely.      Recommendations for follow up therapy are one component of a multi-disciplinary discharge planning process, led by the attending physician.  Recommendations may be updated based on patient status, additional functional criteria and insurance authorization.   Follow Up Recommendations  Skilled nursing-short term rehab (<3 hours/day)    Assistance Recommended at Discharge Frequent or constant Supervision/Assistance  Patient can return home with the following Two people to help with bathing/dressing/bathroom;Assistance with cooking/housework;Direct supervision/assist for medications management;Direct supervision/assist for financial management;Assist for transportation;Help with stairs or ramp for entrance;Two people to help with walking and/or transfers    Functional Status Assessment  Patient has had a recent decline in their functional status and demonstrates the ability to make significant improvements in function  in a reasonable and predictable amount of time.  Equipment Recommendations  Other (comment) (defer to next venue)    Recommendations for Other Services       Precautions / Restrictions Precautions Precautions: Fall Precaution Comments: R IJ catheter Restrictions Weight Bearing Restrictions: No      Mobility Bed Mobility Overal bed mobility: Needs Assistance Bed Mobility: Rolling Rolling: Mod assist              Transfers                          Balance                                           ADL either performed or assessed with clinical judgement   ADL Overall ADL's : Needs assistance/impaired Eating/Feeding: Set up;Bed level   Grooming: Set up;Bed level   Upper Body Bathing: Total assistance;Bed level   Lower Body Bathing: Total assistance;Bed level   Upper Body Dressing : Maximal assistance;Bed level   Lower Body Dressing: Total assistance;Bed level       Toileting- Clothing Manipulation and Hygiene: Total assistance;+2 for physical assistance;Bed level         General ADL Comments: pt with bowel incontinence     Vision Baseline Vision/History: 1 Wears glasses Ability to See in Adequate Light: 0 Adequate Patient Visual Report: No change from baseline       Perception     Praxis      Pertinent Vitals/Pain Pain Assessment Pain Assessment: Faces Faces Pain Scale: Hurts even more Pain Location: B knees, L hand Pain Descriptors / Indicators: Sore,  Discomfort, Grimacing, Guarding Pain Intervention(s): Monitored during session, Repositioned     Hand Dominance Right   Extremity/Trunk Assessment Upper Extremity Assessment Upper Extremity Assessment: RUE deficits/detail;LUE deficits/detail RUE Deficits / Details: generalized weakness, but WFL LUE Deficits / Details: edematous, painful, able to FF actively to 90 degrees and to flex elbow to 90 degrees, moves fingers, but unable flex to grasp LUE: Unable to  fully assess due to pain LUE Coordination: decreased fine motor;decreased gross motor   Lower Extremity Assessment Lower Extremity Assessment: Defer to PT evaluation   Cervical / Trunk Assessment Cervical / Trunk Assessment: Other exceptions (obesity)   Communication Communication Communication: No difficulties   Cognition Arousal/Alertness: Awake/alert Behavior During Therapy: Flat affect Overall Cognitive Status: Impaired/Different from baseline Area of Impairment: Following commands, Problem solving, Memory                     Memory: Decreased short-term memory Following Commands: Follows one step commands with increased time     Problem Solving: Slow processing, Decreased initiation, Requires verbal cues, Requires tactile cues       General Comments       Exercises     Shoulder Instructions      Home Living Family/patient expects to be discharged to:: Private residence Living Arrangements: Alone Available Help at Discharge: Family Type of Home: House Home Access: Stairs to enter Technical brewer of Steps: 5 Entrance Stairs-Rails: Right;Can reach both;Left Home Layout: One level     Bathroom Shower/Tub: Teacher, early years/pre: Standard     Home Equipment: Conservation officer, nature (2 wheels);BSC/3in1          Prior Functioning/Environment Prior Level of Function : Needs assist             Mobility Comments: pt reports using RW as needed in community ADLs Comments: reports independence in ADLs and IADLs        OT Problem List: Decreased strength;Decreased activity tolerance;Impaired balance (sitting and/or standing);Decreased coordination;Decreased cognition;Pain;Impaired UE functional use;Obesity      OT Treatment/Interventions: Self-care/ADL training;DME and/or AE instruction;Therapeutic activities;Cognitive remediation/compensation;Patient/family education;Balance training;Therapeutic exercise    OT Goals(Current goals can  be found in the care plan section) Acute Rehab OT Goals OT Goal Formulation: With patient Time For Goal Achievement: 02/01/22 Potential to Achieve Goals: Good ADL Goals Pt Will Perform Grooming: with set-up;sitting Pt Will Perform Upper Body Bathing: with min assist;sitting Pt Will Perform Upper Body Dressing: with min assist;sitting Pt Will Transfer to Toilet: with mod assist;stand pivot transfer;bedside commode Pt/caregiver will Perform Home Exercise Program: Left upper extremity;Increased ROM;Increased strength;With minimal assist Additional ADL Goal #1: Pt will perform bed mobility with min assist in preparation for ADLs. Additional ADL Goal #2: Pt will follow two step commands with 75% accuracy.  OT Frequency: Min 2X/week    Co-evaluation              AM-PAC OT "6 Clicks" Daily Activity     Outcome Measure Help from another person eating meals?: A Little Help from another person taking care of personal grooming?: A Little Help from another person toileting, which includes using toliet, bedpan, or urinal?: Total Help from another person bathing (including washing, rinsing, drying)?: A Lot Help from another person to put on and taking off regular upper body clothing?: A Lot Help from another person to put on and taking off regular lower body clothing?: Total 6 Click Score: 12   End of Session    Activity Tolerance: Treatment limited  secondary to medical complications (Comment) (bleeding IJ) Patient left: in bed;with call bell/phone within reach;with bed alarm set;with family/visitor present;with nursing/sitter in room  OT Visit Diagnosis: Pain;Muscle weakness (generalized) (M62.81);Other symptoms and signs involving cognitive function                Time: 7672-0947 OT Time Calculation (min): 35 min Charges:  OT General Charges $OT Visit: 1 Visit OT Evaluation $OT Eval Moderate Complexity: 1 Mod OT Treatments $Self Care/Home Management : 8-22 mins  Cleta Alberts,  OTR/L Acute Rehabilitation Services Office: [phone]  Malka So 01/18/2022, 1:42 PM

## 2022-01-18 NOTE — Progress Notes (Signed)
OT Cancellation Note  Patient Details Name: [name] MRN: [id number] DOB: [birth year]   Cancelled Treatment:    Reason Eval/Treat Not Completed: Patient declined, no reason specified (Pt in IR for temporary HD catheter placement.)  Malka So 01/18/2022, 9:29 AM Cleta Alberts, OTR/L [place] Office: [phone]

## 2022-01-18 NOTE — Progress Notes (Signed)
RN called into room by Almyra Free, OT and Bre, Nurse Tech due to pt bleeding from IJ. Reinforcement dressing applied. Junie Panning, Agricultural consultant notified.

## 2022-01-18 NOTE — TOC Initial Note (Signed)
Transition of Care [facility]) - Initial/Assessment Note    Patient Details  Name: [name] MRN: [id number] Date of Birth: [birth year]  [facility]) CM/SW Contact:    Benard Halsted, LCSW Phone Number: 01/18/2022, 3:29 PM  Clinical Narrative:                 CSW received consult for possible SNF placement at time of discharge. CSW spoke with patient and son at bedside. Patient reported that patient's family is currently unable to care for patient at their home given patient's current physical needs and fall risk. Patient expressed understanding of PT recommendation and is agreeable to SNF placement at time of discharge. CSW discussed insurance authorization process and will provide Medicare SNF ratings list. CSW will send out referrals for review and provide bed offers as available in order to locate a SNF that will transport him to outpatient dialysis.   Skilled Nursing Rehab Facilities-   [email or web address]   Ratings out of 5 stars (the highest)   Name Address  Phone # Quality Care Staffing Health Inspection Overall  [facility] [address], [place] '5 5 2 4  '$ [facility]  [address] [place], [place] [phone] '4 2 5 5  '$ [facility] [place], [place] '1 1 1 1  '$ [place], [place] '2 2 4 4  '$ [facility] [address], [place] '1 1 2 1  '$ [place] [place] '3 2 4 4  '$ [facility] [address] [place] '5 1 3 3  '$ [facility] [address] [place] '5 2 3 4  '$ [address] ([place]) [place], [place] [phone] '4 1 2 1  '$ [facility] [phone], [place] [phone] '4 1 1 1  '$ [facility] [address], [facility] [place] [phone] '3 1 2 1  '$ [facility] ([place]) [place]. [address], [place]  [phone] '4 1 1 1  '$ [place] [address] '4 2 4 4          '$ [place]      [facility] [place] '4 1 3 2  '$ [facility] [place] [address] [place] '3 1 5 4  '$ [facility], [place] [address], [place] [phone] '1 1 1 1  '$ [facility] [address], [facility] [phone] '2 2 3 3          '$ [place] (no [facility]) [place] [address], [place] [phone] '5 5 5 5  '$ [facility] (No Humana) [address], [place] '4 1 4 3  '$ [facility] (No [employer]) [place], [place] '5 5 5 5  '$ [facility] [address] [phone] '2 3 5 5  '$ [place] [address] [place] '1 1 2 1  '$ [facility] [address] '3 1 1 1  '$ [place], [place] '5 2 4 5  '$ [facility]  [address] [place] '2 2 1 1  '$ [facility] [address] [place] '3 2 1 1  '$ [facility] [address], [place] '2 2 2 2          '$ [facility] [address], [place] '1 1 1 1  '$ [place] [address], [place]  [phone] '2 4 2 2  '$ [facility] [place] [address], [place] [phone] '4 2 3 3  '$ [place] [address], [place] '2 1 1 1  '$ [place] (No Humana) [address], [place] [phone] '2 2 3 3  '$ [facility] [address] [place] [phone] '2 1 1 1          '$ [facility] [place], [place] '5 4 5 5  '$ [facility] ([place])  [address], [place] '2 1 2 1  '$ [facility]) [place] [address] [place] [phone] '3 1 4 3  '$ [facility] [address], [place] '3 3 4 4  '$ [address] [place],  [place] '2 3 1 1  '$ [facility]) [address] [place] [phone] '2 1 4 3     '$ Expected Discharge Plan: [place] Barriers to Discharge: Insurance Authorization, Continued Medical Work up, Waiting for outpatient dialysis, SNF Pending bed offer   Patient Goals and CMS Choice Patient states their goals for this hospitalization and ongoing recovery are:: return home CMS Medicare.gov Compare Post Acute Care list provided to:: Patient Choice offered to / list presented to : Patient, Adult Children  Expected Discharge Plan and Services Expected Discharge Plan: [place] In-house Referral: Clinical Social Work   Post Acute Care Choice: [place] Living arrangements for the past 2 months: [place]                                      Prior Living Arrangements/Services Living arrangements for the past 2 months: Single Family Home Lives with:: Self Patient language and need for interpreter reviewed:: Yes Do you feel safe going back to the place where you live?: Yes      Need for Family Participation in Patient Care: Yes (Comment) Care giver support system in place?: Yes (comment)   Criminal Activity/Legal Involvement Pertinent to Current Situation/Hospitalization: No - Comment as needed  Activities of Daily Living      Permission Sought/Granted Permission sought to share information with : Facility Sport and exercise psychologist, Family Supports Permission granted to share information with : Yes, Verbal Permission Granted  Share Information with NAME: Gwenlyn Fudge  Permission granted to share info w AGENCY: SNFs  Permission granted to share info w Relationship: Son  Permission granted to share info w Contact Information: [phone]  Emotional Assessment Appearance:: Appears stated age Attitude/Demeanor/Rapport: Engaged Affect (typically observed): Accepting Orientation: : Oriented  to Self, Oriented to Place, Oriented to Situation, Oriented to  Time Alcohol / Substance Use: Not Applicable Psych Involvement: No (comment)  Admission diagnosis:  Encephalopathy [G93.40] Sepsis ([place]) [A41.9] Altered mental status, unspecified altered mental status type [R41.82] Patient Active Problem List   Diagnosis Date Noted   SIRS (systemic inflammatory response syndrome) ([place]) 35/36/1443   Acute metabolic encephalopathy 15/40/0867   Hypoglycemia associated with type 2 diabetes mellitus ([place]) 01/16/2022   Gout flare 01/16/2022   Left knee pain 01/16/2022   Current chronic use of systemic steroids 01/16/2022   Prostate cancer ([place]) 01/16/2022   Left arm weakness 01/16/2022   Abnormal CXR 01/16/2022   Sepsis ([name]) 01/15/2022   Altered mental status    Elevated prostate specific antigen (PSA) 05/16/2021   Pain due to onychomycosis of toenails of both  feet 03/06/2019   Hyperparathyroidism, secondary renal ([place]) 04/16/2017   Combined forms of age-related cataract of both eyes 10/18/2016   Vitreous hemorrhage of right eye ([place]) 10/18/2016   History of vitrectomy 08/19/2014   Healthcare maintenance 05/31/2014   Swelling of left hand 05/24/2014   CKD (chronic kidney disease) stage 5, GFR less than 15 ml/min (HCC) 01/30/2012   Morbid obesity ([place]) 10/22/2011   Nuclear cataract 10/11/2011   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/12/2011   Proliferative diabetic retinopathy of both eyes with macular edema associated with type 2 diabetes mellitus ([place]) 04/12/2011   Anemia 12/26/2010   AKI (acute kidney injury) ([place]) 12/26/2010   Obesity (BMI 30-39.9) 03/29/2009   Osteoarthritis 03/16/2009   DIABETIC  RETINOPATHY 02/06/2007   Hyperlipidemia LDL goal <100 06/08/2006   PAROXYSMAL ATRIAL TACHYCARDIA 06/08/2006   GERD 06/08/2006   Type II diabetes mellitus with complication, uncontrolled 04/22/2006   Essential hypertension 04/22/2006   PCP:  Sonia Side., FNP Pharmacy:   [facility] [place] [address] [place]) [place] [address] Phone: [phone] Fax: [phone]     Social Determinants of Health ([place]) Interventions    Readmission Risk Interventions     No data to display

## 2022-01-18 NOTE — Procedures (Signed)
Interventional Radiology Procedure Note  Procedure: Image guided placement of temporary hemodialysis catheter  Findings: Please refer to procedural dictation for full description. 12 Fr, 20 cm Trialysis, right IJ.  Tip in right atrium.  Complications: None immediate  Estimated Blood Loss:  < 5 mL  Recommendations: Catheter ready for immediate use.   Ruthann Cancer, MD

## 2022-01-18 NOTE — Progress Notes (Signed)
New Dialysis Start   Patient identified as new dialysis start. Kidney Education packet assembled and given. Discussed the following items with patient:    Current medications and possible changes once started:  Discussed that patient's medications may change over time.  Ex; hypertension medications and diabetes medication.  Nephrologists will adjust as needed.  Fluid restrictions reviewed:  32 oz daily goal:  All liquids count; soups, ice, jello   Phosphorus and potassium: Handout given showing high potassium and phosphorus foods.  Alternative food and drink options given.  Family support:  son present at bedside and  participated  Outpatient Clinic Resources:  Discussed roles of Outpatient clinic  staff and advised to make a list of needs, if any, to talk with outpatient staff if needed  Care plan schedule: Informed patient and family member of Care Plans in outpatient setting and to participate in the care plan.  An invitation would be given from outpatient clinic.   Dialysis Access Options:  Reviewed access options with patients. Discussed in detail about care at home with new AVG & AVF. Reviewed checking bruit and thrill. If dialysis catheter present, educated that patient could not take showers.  Catheter dressing changes were to be done by outpatient clinic staff only  Home therapy options:  Educated patient about home therapy options:  PD vs home hemo.     Patient verbalized understanding. Will continue to round on patient during admission.    Lilia Argue, RN

## 2022-01-18 NOTE — Progress Notes (Signed)
[facility] Progress Note   Assessment/ Plan:    AKI on CKD V: In the setting of septic arthritis with confusion--> uremia.  I think this is the sentinel event that pushes him over into ESRD.  D/w pt and son- will need to start HD this admission.               - AVF will need to heal before it can be used--> will c/s IR for [facility], appreciate assistance nontunneled placed since he's got WBC, etc so will need to be converted when appropriate             - blood cultures are negative              - writing HD orders- HD #1 today  - ordering thrombipad for mild oozing   2.  L knee septic arthritis             - s/p arthrocentesis with ortho, appreciate assistance             - on vanc/ cefepime/ solumedrol   3.  Hyperkalemia             - Lokelma TID until dialysis   4.  Hyponatremia:             - volume- related, HD will correct this   5.  Elevated LFTs:             - marked elevation in the last 2 days             - stop atorvastatin             - RUQ [place]   6.  Dispo: inpt  Subjective:    Seen in room.  Is s/p nontunneled HD catheter   Objective:   BP (!) 142/70 (BP Location: Right Wrist)   Pulse 71   Temp 97.6 F (36.4 C) (Oral)   Resp 18   Ht 5' 11"  (1.803 m)   Wt 123.6 kg   SpO2 98%   BMI 38.00 kg/m  No intake or output data in the 24 hours ending 01/18/22 1333 Weight change: -0.2 kg  Physical Exam: GEN NAD, sitting in bed, some cognitive slowing HEENT EOMI PERRL NECK NO JVD PULM  clear CV RRR  ABD + pannus EXT no LE edema NEURO cognitive slowing/ delayed processing, no frank asterixis MSK L knee tenderness ACCESS L AVF + T/B, healing well, R IJ nontunneled HD catheter with some oozing  Imaging: IR Fluoro Guide CV Line Right  Result Date: 01/18/2022 INDICATION: 66 year old male with worsening end-stage renal failure requiring initiation of hemodialysis. Temporary hemodialysis catheter requested. EXAM: NON-TUNNELED CENTRAL VENOUS HEMODIALYSIS  CATHETER PLACEMENT WITH ULTRASOUND AND FLUOROSCOPIC GUIDANCE COMPARISON:  None Available. MEDICATIONS: None FLUOROSCOPY TIME:  One mGy COMPLICATIONS: None immediate. PROCEDURE: Informed written consent was obtained from the patient after a discussion of the risks, benefits, and alternatives to treatment. Questions regarding the procedure were encouraged and answered. The right neck and chest were prepped with chlorhexidine in a sterile fashion, and a sterile drape was applied covering the operative field. Maximum barrier sterile technique with sterile gowns and gloves were used for the procedure. A timeout was performed prior to the initiation of the procedure. After the overlying soft tissues were anesthetized, a small venotomy incision was created and a micropuncture kit was utilized to access the internal jugular vein. Real-time ultrasound guidance was utilized for vascular access including the acquisition of a  permanent ultrasound image documenting patency of the accessed vessel. The microwire was utilized to measure appropriate catheter length. A Rosen wire was advanced to the level of the IVC. Under fluoroscopic guidance, the venotomy was serially dilated, ultimately allowing placement of a 20 cm temporary Trialysis catheter with tip ultimately terminating within the superior aspect of the right atrium. Final catheter positioning was confirmed and documented with a spot radiographic image. The catheter aspirates and flushes normally. The catheter was flushed with appropriate volume heparin dwells. The catheter exit site was secured with a 0 silk retention suture. A dressing was placed. The patient tolerated the procedure well without immediate post procedural complication. IMPRESSION: Successful placement of a right internal jugular approach 20 cm temporary dialysis catheter with tip terminating with in the superior aspect of the right atrium. The catheter is ready for immediate use. PLAN: This catheter may be  converted to a tunneled dialysis catheter at a later date as indicated. Ruthann Cancer, MD [facility] Radiology Electronically Signed   By: Ruthann Cancer M.D.   On: 01/18/2022 10:04   US Abdomen Limited RUQ (LIVER/GB)  Result Date: 01/17/2022 CLINICAL DATA:  Elevated LFTs EXAM: ULTRASOUND ABDOMEN LIMITED RIGHT UPPER QUADRANT COMPARISON:  None Available. FINDINGS: Gallbladder: No gallstones or wall thickening visualized. No sonographic Murphy sign noted by sonographer. Common bile duct: Diameter: 3.7 mm Liver: No focal lesion identified. Within normal limits in parenchymal echogenicity. Portal vein is patent on color Doppler imaging with normal direction of blood flow towards the liver. Other: None. IMPRESSION: Normal right upper quadrant ultrasound. Electronically Signed   By: Ronney Asters M.D.   On: 01/17/2022 19:30   ECHOCARDIOGRAM COMPLETE  Result Date: 01/17/2022    ECHOCARDIOGRAM REPORT   Patient Name:   JHOAN SCHMIEDER Date of Exam: 01/17/2022 Medical Rec #:  [id number]      Height:       71.0 in Accession #:    [id number]     Weight:       272.9 lb Date of Birth:  [birth year]       BSA:          2.406 m Patient Age:    66 years       BP:           123/65 mmHg Patient Gender: M              HR:           84 bpm. Exam Location:  Inpatient Procedure: 2D Echo, Cardiac Doppler, Color Doppler and Intracardiac            Opacification Agent Indications:    Abnormal CXR  History:        Patient has prior history of Echocardiogram examinations, most                 recent 04/20/2019. CHF; Risk Factors:Hypertension, Diabetes and                 Dyslipidemia.  Sonographer:    Jefferey Pica Referring Phys: [id number] A CALDWELL POWELL JR IMPRESSIONS  1. Left ventricular ejection fraction, by estimation, is 55 to 60%. The left ventricle has normal function. The left ventricle has no regional wall motion abnormalities. There is mild concentric left ventricular hypertrophy.  Left ventricular diastolic parameters were normal.  2. Right ventricular systolic function is normal. The right ventricular size is normal. There is normal pulmonary artery systolic pressure.  3. The mitral valve is  normal in structure. Trivial mitral valve regurgitation. No evidence of mitral stenosis.  4. The aortic valve is tricuspid. Aortic valve regurgitation is not visualized. No aortic stenosis is present.  5. The inferior vena cava is normal in size with greater than 50% respiratory variability, suggesting right atrial pressure of 3 mmHg. FINDINGS  Left Ventricle: Left ventricular ejection fraction, by estimation, is 55 to 60%. The left ventricle has normal function. The left ventricle has no regional wall motion abnormalities. The left ventricular internal cavity size was normal in size. There is  mild concentric left ventricular hypertrophy. Left ventricular diastolic parameters were normal. Right Ventricle: The right ventricular size is normal. No increase in right ventricular wall thickness. Right ventricular systolic function is normal. There is normal pulmonary artery systolic pressure. The tricuspid regurgitant velocity is 2.74 m/s, and  with an assumed right atrial pressure of 5 mmHg, the estimated right ventricular systolic pressure is 24.2 mmHg. Left Atrium: Left atrial size was normal in size. Right Atrium: Right atrial size was normal in size. Pericardium: There is no evidence of pericardial effusion. Mitral Valve: The mitral valve is normal in structure. Trivial mitral valve regurgitation. No evidence of mitral valve stenosis. Tricuspid Valve: The tricuspid valve is normal in structure. Tricuspid valve regurgitation is trivial. No evidence of tricuspid stenosis. Aortic Valve: The aortic valve is tricuspid. Aortic valve regurgitation is not visualized. No aortic stenosis is present. Aortic valve peak gradient measures 8.9 mmHg. Pulmonic Valve: The pulmonic valve was not well visualized. Pulmonic  valve regurgitation is not visualized. No evidence of pulmonic stenosis. Aorta: The aortic root is normal in size and structure. Venous: The inferior vena cava is normal in size with greater than 50% respiratory variability, suggesting right atrial pressure of 3 mmHg. IAS/Shunts: No atrial level shunt detected by color flow Doppler.  LEFT VENTRICLE PLAX 2D LVIDd:         5.50 cm   Diastology LVIDs:         3.70 cm   LV e' medial:    7.49 cm/s LV PW:         1.20 cm   LV E/e' medial:  13.0 LV IVS:        1.20 cm   LV e' lateral:   7.56 cm/s LVOT diam:     2.00 cm   LV E/e' lateral: 12.9 LV SV:         82 LV SV Index:   34 LVOT Area:     3.14 cm  RIGHT VENTRICLE             IVC RV S prime:     12.80 cm/s  IVC diam: 2.00 cm TAPSE (M-mode): 2.6 cm LEFT ATRIUM           Index        RIGHT ATRIUM           Index LA diam:      4.10 cm 1.70 cm/m   RA Area:     17.00 cm LA Vol (A4C): 68.8 ml 28.59 ml/m  RA Volume:   39.30 ml  16.33 ml/m  AORTIC VALVE                 PULMONIC VALVE AV Area (Vmax): 2.99 cm     PV Vmax:       0.89 m/s AV Vmax:        149.00 cm/s  PV Peak grad:  3.2 mmHg AV Peak Grad:   8.9  mmHg LVOT Vmax:      142.00 cm/s LVOT Vmean:     78.300 cm/s LVOT VTI:       0.260 m  AORTA Ao Root diam: 3.50 cm Ao Asc diam:  3.40 cm MITRAL VALVE               TRICUSPID VALVE MV Area (PHT): 7.09 cm    TR Peak grad:   30.0 mmHg MV Decel Time: 107 msec    TR Vmax:        274.00 cm/s MV E velocity: 97.30 cm/s MV A velocity: 47.70 cm/s  SHUNTS MV E/A ratio:  2.04        Systemic VTI:  0.26 m                            Systemic Diam: 2.00 cm Kardie Tobb DO Electronically signed by Berniece Salines DO Signature Date/Time: 01/17/2022/11:57:08 AM    Final    US RENAL  Result Date: 01/16/2022 CLINICAL DATA:  AKI. EXAM: RENAL / URINARY TRACT ULTRASOUND COMPLETE COMPARISON:  08/17/2021. FINDINGS: Right Kidney: Renal measurements: 10.5 x 5.4 x 4.8 cm = volume: 142.05 mL. Echogenicity within normal limits. No mass or  hydronephrosis visualized. Left Kidney: Renal measurements: 10.3 x 5.9 x 4.6 cm = volume: 146.72 mL. Echogenicity within normal limits. An avascular hypoechoic region is noted in the mid left kidney measuring 2.5 x 1.7 cm, possible cyst. No mass or hydronephrosis visualized. Bladder: Appears normal for degree of bladder distention. Other: Examination is limited due to patient's body habitus and limited mobility. IMPRESSION: 1. Avascular hypoechoic structure in the mid left kidney, possible cyst. 2. Normal evaluation of the right kidney and bladder. Electronically Signed   By: Brett Fairy M.D.   On: 01/16/2022 20:16   MR BRAIN WO CONTRAST  Result Date: 01/16/2022 CLINICAL DATA:  Neuro deficit, acute, stroke suspected. EXAM: MRI HEAD WITHOUT CONTRAST TECHNIQUE: Multiplanar, multiecho pulse sequences of the brain and surrounding structures were obtained without intravenous contrast. COMPARISON:  01/15/2022 CT FINDINGS: Brain: Diffusion imaging does not show any acute or subacute infarction or other cause of restricted diffusion. There is generalized brain atrophy, advanced for age. Old small vessel infarctions affect the central pons. Few old small vessel cerebellar infarctions. Cerebral hemispheres show old infarctions of the left basal ganglia and radiating white matter tracts. Mild chronic small-vessel ischemic change seen elsewhere. No large vessel territory infarction. No mass, hemorrhage, hydrocephalus or extra-axial collection. Vascular: Major vessels at the base of the brain show flow. Skull and upper cervical spine: Negative Sinuses/Orbits: Clear/normal Other: None IMPRESSION: No acute MR finding. Accelerated generalized atrophy. 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 Electronically Signed   By: Nelson Chimes M.D.   On: 01/16/2022 16:12   DG Hand 2 View Left  Result Date:  01/16/2022 CLINICAL DATA:  Pain and swelling after falling from bed. EXAM: LEFT HAND - 2 VIEW COMPARISON:  None Available. FINDINGS: Generalized regional swelling. No evidence fracture or dislocation. Small erosions are noted at several of the inter phalangeal joints which could be seen with erosive arthritis including erosive osteoarthritis. IMPRESSION: No acute finding other than soft tissue swelling. 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 Electronically Signed   By: Nelson Chimes M.D.   On: 01/16/2022 15:07    Labs: BMET Recent Labs  Lab 01/15/22 0928 01/15/22 1017 01/15/22 1323 01/16/22 0410 01/17/22 0441 01/17/22 1005 01/18/22 0339  NA 139 138  --  135 132* 129* 129*  K 3.7 3.6  --  5.3* 5.8* 5.4* 4.8  CL 103 101  --  98 95* 93* 93*  CO2 22  --   --  21* 18* 16* 17*  GLUCOSE 104* 101*  --  253* 367* 360* 360*  BUN 102* 108*  --  116* 145* 149* 170*  CREATININE 5.68* 6.30*  --  6.85* 8.49* 8.64* 9.54*  CALCIUM 7.6*  --   --  7.9* 7.4* 7.5* 7.1*  PHOS  --   --  4.9*  --  9.7* 9.7* 10.1*   CBC Recent Labs  Lab 01/15/22 1028 01/16/22 0410 01/16/22 1758 01/17/22 0441 01/18/22 0339  WBC 14.5* 15.1*  --  15.3* 16.5*  NEUTROABS 11.4*  --   --  14.0*  --   HGB 9.3* 7.8* 8.3* 8.1* 8.1*  HCT 29.4* 25.2* 25.9* 25.9* 24.6*  MCV 91.9 93.3  --  92.8 88.8  PLT 192 169  --  163 151    Medications:     aspirin EC  81 mg Oral Daily   calcitRIOL  0.25 mcg Oral Daily   Chlorhexidine Gluconate Cloth  6 each Topical Q0600   heparin  5,000 Units Subcutaneous Q12H   insulin aspart  0-9 Units Subcutaneous TID WC   insulin aspart  8 Units Subcutaneous TID WC   insulin detemir  30 Units Subcutaneous BID   methylPREDNISolone (SOLU-MEDROL) injection  40 mg Intravenous Q1200   pantoprazole (PROTONIX) IV  40 mg Intravenous Q12H   sodium chloride flush  3 mL Intravenous Q12H   sodium chloride flush  3 mL Intravenous Q12H   sodium zirconium  cyclosilicate  10 g Oral TID   vancomycin variable dose per unstable renal function (pharmacist dosing)   Does not apply See admin instructions    Madelon Lips, MD 01/18/2022, 1:33 PM

## 2022-01-19 DIAGNOSIS — A419 Sepsis, unspecified organism: Secondary | ICD-10-CM | POA: Diagnosis not present

## 2022-01-19 DIAGNOSIS — I480 Paroxysmal atrial fibrillation: Secondary | ICD-10-CM | POA: Diagnosis not present

## 2022-01-19 DIAGNOSIS — G9340 Encephalopathy, unspecified: Secondary | ICD-10-CM | POA: Diagnosis not present

## 2022-01-19 DIAGNOSIS — R4182 Altered mental status, unspecified: Secondary | ICD-10-CM | POA: Diagnosis not present

## 2022-01-19 DIAGNOSIS — G9341 Metabolic encephalopathy: Secondary | ICD-10-CM | POA: Diagnosis not present

## 2022-01-19 LAB — HEPATIC FUNCTION PANEL
ALT: 129 U/L — ABNORMAL HIGH (ref 0–44)
AST: 345 U/L — ABNORMAL HIGH (ref 15–41)
Albumin: 1.6 g/dL — ABNORMAL LOW (ref 3.5–5.0)
Alkaline Phosphatase: 133 U/L — ABNORMAL HIGH (ref 38–126)
Bilirubin, Direct: 0.2 mg/dL (ref 0.0–0.2)
Indirect Bilirubin: 0.7 mg/dL (ref 0.3–0.9)
Total Bilirubin: 0.9 mg/dL (ref 0.3–1.2)
Total Protein: 6.1 g/dL — ABNORMAL LOW (ref 6.5–8.1)

## 2022-01-19 LAB — BASIC METABOLIC PANEL
Anion gap: 19 — ABNORMAL HIGH (ref 5–15)
BUN: 143 mg/dL — ABNORMAL HIGH (ref 8–23)
CO2: 19 mmol/L — ABNORMAL LOW (ref 22–32)
Calcium: 7.6 mg/dL — ABNORMAL LOW (ref 8.9–10.3)
Chloride: 92 mmol/L — ABNORMAL LOW (ref 98–111)
Creatinine, Ser: 7.97 mg/dL — ABNORMAL HIGH (ref 0.61–1.24)
GFR, Estimated: 7 mL/min — ABNORMAL LOW (ref >60)
Glucose, Bld: 255 mg/dL — ABNORMAL HIGH (ref 70–99)
Potassium: 3.9 mmol/L (ref 3.5–5.1)
Sodium: 130 mmol/L — ABNORMAL LOW (ref 135–145)

## 2022-01-19 LAB — CBC
HCT: 24.7 % — ABNORMAL LOW (ref 39.0–52.0)
Hemoglobin: 8 g/dL — ABNORMAL LOW (ref 13.0–17.0)
MCH: 28.8 pg (ref 26.0–34.0)
MCHC: 32.4 g/dL (ref 30.0–36.0)
MCV: 88.8 fL (ref 80.0–100.0)
Platelets: 179 10*3/uL (ref 150–400)
RBC: 2.78 MIL/uL — ABNORMAL LOW (ref 4.22–5.81)
RDW: 14.2 % (ref 11.5–15.5)
WBC: 20.1 10*3/uL — ABNORMAL HIGH (ref 4.0–10.5)
nRBC: 0.2 % (ref 0.0–0.2)

## 2022-01-19 LAB — HEPATITIS B SURFACE ANTIBODY, QUANTITATIVE: Hep B S AB Quant (Post): <3.1 m[IU]/mL — ABNORMAL LOW (ref >9.9)

## 2022-01-19 LAB — GLUCOSE, CAPILLARY
Glucose-Capillary: 253 mg/dL — ABNORMAL HIGH (ref 70–99)
Glucose-Capillary: 238 mg/dL — ABNORMAL HIGH (ref 70–99)
Glucose-Capillary: 260 mg/dL — ABNORMAL HIGH (ref 70–99)
Glucose-Capillary: 235 mg/dL — ABNORMAL HIGH (ref 70–99)
Glucose-Capillary: 239 mg/dL — ABNORMAL HIGH (ref 70–99)
Glucose-Capillary: 201 mg/dL — ABNORMAL HIGH (ref 70–99)

## 2022-01-19 LAB — BODY FLUID CULTURE W GRAM STAIN: Culture: NO GROWTH

## 2022-01-19 MED ORDER — ANTICOAGULANT SODIUM CITRATE 4% (200MG/5ML) IV SOLN
5.0000 mL | Freq: Once | Status: DC
  Filled 2022-01-19: qty 5

## 2022-01-19 MED ORDER — PREDNISONE 20 MG PO TABS
40.0000 mg | ORAL_TABLET | Freq: Every day | ORAL | Status: DC
Start: 2022-01-19 — End: 2022-01-20
  Filled 2022-01-19: qty 2

## 2022-01-19 MED ORDER — RENA-VITE PO TABS
1.0000 | ORAL_TABLET | Freq: Every day | ORAL | Status: DC
  Administered 2022-01-19 – 2022-02-02 (×15): 1 via ORAL
  Filled 2022-01-19 (×15): qty 1

## 2022-01-19 MED ORDER — SODIUM ZIRCONIUM CYCLOSILICATE 10 G PO PACK
10.0000 g | PACK | Freq: Every day | ORAL | Status: DC
  Administered 2022-01-20: 10 g via ORAL
  Filled 2022-01-19: qty 1

## 2022-01-19 MED ORDER — POTASSIUM CHLORIDE 10 MEQ/100ML IV SOLN
INTRAVENOUS | Status: AC
  Administered 2022-01-19: 10 meq via INTRAVENOUS
  Filled 2022-01-19: qty 100

## 2022-01-19 MED ORDER — CARVEDILOL 12.5 MG PO TABS
12.5000 mg | ORAL_TABLET | Freq: Two times a day (BID) | ORAL | Status: DC
  Administered 2022-01-19 – 2022-01-20 (×2): 12.5 mg via ORAL
  Filled 2022-01-19 (×2): qty 1

## 2022-01-19 MED ORDER — MAGNESIUM SULFATE 2 GM/50ML IV SOLN: 2.0000 g | Freq: Once | INTRAVENOUS | Status: DC

## 2022-01-19 MED ORDER — SODIUM CHLORIDE 0.9 % IV SOLN
0.3000 ug/kg | Freq: Once | INTRAVENOUS | Status: AC
  Administered 2022-01-19: 37.2 ug via INTRAVENOUS
  Filled 2022-01-19 (×2): qty 9.3

## 2022-01-19 MED ORDER — POTASSIUM CHLORIDE 10 MEQ/100ML IV SOLN: 10.0000 meq | INTRAVENOUS | Status: DC

## 2022-01-19 MED ORDER — NEPRO/CARBSTEADY PO LIQD
237.0000 mL | Freq: Two times a day (BID) | ORAL | Status: DC
  Administered 2022-01-19 – 2022-02-01 (×20): 237 mL via ORAL

## 2022-01-19 MED ORDER — DILTIAZEM HCL-DEXTROSE 125-5 MG/125ML-% IV SOLN (PREMIX)
5.0000 mg/h | INTRAVENOUS | Status: DC
  Administered 2022-01-19: 5 mg/h via INTRAVENOUS
  Filled 2022-01-19: qty 125

## 2022-01-19 MED ORDER — POTASSIUM CHLORIDE 10 MEQ/100ML IV SOLN
10.0000 meq | INTRAVENOUS | Status: AC
  Administered 2022-01-19: 10 meq via INTRAVENOUS
  Filled 2022-01-19: qty 100

## 2022-01-19 NOTE — Progress Notes (Signed)
Paged IR concerning the bleeding Dr. Dwaine Gale called back gave instruction to sit patient up and apply pressure to the site for 10 mins. Patient can then go for his dialysis. Dr. Dwaine Gale said if bleeding persist, they will come and put stitch to the site first thin in the moring.

## 2022-01-19 NOTE — Progress Notes (Signed)
PT Cancellation Note  Patient Details Name: [name] MRN: [id number] DOB: Jan 28, 1956   Cancelled Treatment:    Reason Eval/Treat Not Completed: Fatigue/lethargy limiting ability to participate - pt states he has had a rough day and wants to rest, pt also with visitors at bedside. PT to check back at a later date.   Stacie Glaze, PT DPT Acute Rehabilitation Services Pager [phone]  Office [phone]   Louis Matte 01/19/2022, 3:13 PM

## 2022-01-19 NOTE — Progress Notes (Signed)
[facility] Progress Note   Assessment/ Plan:    AKI on CKD V: In the setting of septic arthritis with confusion--> uremia.  I think this is the sentinel event that pushes him over into ESRD.  D/w pt and son- will need to start HD this admission.               - AVF will need to heal before it can be used--> will c/s IR for [facility], appreciate assistance  - nontunneled placed since he's got WBC, etc so will need to be converted when appropriate             - blood cultures are negative              - HD #1 overnight 01/18/22, poorly tolerated  - HD #2 tomorrow first shift  - called IR and asked for assessment of site.  DDAVP hemostatic dose ordered stat   2.  L knee septic arthritis             - s/p arthrocentesis with ortho, appreciate assistance             - on vanc/ cefepime/ solumedrol   3.  Hyperkalemia             - Lokelma TID until dialysis   4.  Hyponatremia:             - volume- related, HD will correct this   5.  Elevated LFTs:             - marked elevation in the last 2 days, now improving             - stop atorvastatin             - RUQ [place] normal  6.  Encephalopathy:  - combination likely of uremia/ sepsis/ ? Liver  - ammonia 24 01/15/22  7.  Afib with RVR:  - on dilt gtt   8.  Dispo: inpt  Subjective:    S/p first HD session 01/18/22 overnight- had Afib with RVR, treatment terminated after 1 hr.  Nontunneled HD cath continues to ooze despite thrombipads etc.     Objective:   BP 137/71 (BP Location: Right Arm)   Pulse 96   Temp 97.7 F (36.5 C) (Oral)   Resp 17   Ht 5' 11"  (1.803 m)   Wt 124.6 kg   SpO2 98%   BMI 38.31 kg/m   Intake/Output Summary (Last 24 hours) at 01/19/2022 1203 Last data filed at 01/19/2022 0240 Gross per 24 hour  Intake --  Output 0 ml  Net 0 ml   Weight change: 1 kg  Physical Exam: GEN NAD, sitting in bed, ill-appearing HEENT EOMI PERRL NECK NO JVD PULM  clear CV RRR  ABD + pannus EXT no LE edema NEURO  cognitive slowing/ delayed processing, no frank asterixis MSK L knee tenderness ACCESS L AVF + T/B, healing well, R IJ nontunneled HD catheter with ++ oozing and saturating dressing  Imaging: IR Fluoro Guide CV Line Right  Result Date: 01/18/2022 INDICATION: 66 year old male with worsening end-stage renal failure requiring initiation of hemodialysis. Temporary hemodialysis catheter requested. EXAM: NON-TUNNELED CENTRAL VENOUS HEMODIALYSIS CATHETER PLACEMENT WITH ULTRASOUND AND FLUOROSCOPIC GUIDANCE COMPARISON:  None Available. MEDICATIONS: None FLUOROSCOPY TIME:  One mGy COMPLICATIONS: None immediate. PROCEDURE: Informed written consent was obtained from the patient after a discussion of the risks, benefits, and alternatives to treatment. Questions regarding the procedure were encouraged  and answered. The right neck and chest were prepped with chlorhexidine in a sterile fashion, and a sterile drape was applied covering the operative field. Maximum barrier sterile technique with sterile gowns and gloves were used for the procedure. A timeout was performed prior to the initiation of the procedure. After the overlying soft tissues were anesthetized, a small venotomy incision was created and a micropuncture kit was utilized to access the internal jugular vein. Real-time ultrasound guidance was utilized for vascular access including the acquisition of a permanent ultrasound image documenting patency of the accessed vessel. The microwire was utilized to measure appropriate catheter length. A Rosen wire was advanced to the level of the IVC. Under fluoroscopic guidance, the venotomy was serially dilated, ultimately allowing placement of a 20 cm temporary Trialysis catheter with tip ultimately terminating within the superior aspect of the right atrium. Final catheter positioning was confirmed and documented with a spot radiographic image. The catheter aspirates and flushes normally. The catheter was flushed with  appropriate volume heparin dwells. The catheter exit site was secured with a 0 silk retention suture. A dressing was placed. The patient tolerated the procedure well without immediate post procedural complication. IMPRESSION: Successful placement of a right internal jugular approach 20 cm temporary dialysis catheter with tip terminating with in the superior aspect of the right atrium. The catheter is ready for immediate use. PLAN: This catheter may be converted to a tunneled dialysis catheter at a later date as indicated. Ruthann Cancer, MD [facility] Radiology Electronically Signed   By: Ruthann Cancer M.D.   On: 01/18/2022 10:04   US Abdomen Limited RUQ (LIVER/GB)  Result Date: 01/17/2022 CLINICAL DATA:  Elevated LFTs EXAM: ULTRASOUND ABDOMEN LIMITED RIGHT UPPER QUADRANT COMPARISON:  None Available. FINDINGS: Gallbladder: No gallstones or wall thickening visualized. No sonographic Murphy sign noted by sonographer. Common bile duct: Diameter: 3.7 mm Liver: No focal lesion identified. Within normal limits in parenchymal echogenicity. Portal vein is patent on color Doppler imaging with normal direction of blood flow towards the liver. Other: None. IMPRESSION: Normal right upper quadrant ultrasound. Electronically Signed   By: Ronney Asters M.D.   On: 01/17/2022 19:30    Labs: BMET Recent Labs  Lab 01/15/22 0928 01/15/22 1017 01/15/22 1323 01/16/22 0410 01/17/22 0441 01/17/22 1005 01/18/22 0339 01/19/22 0321  NA 139 138  --  135 132* 129* 129* 130*  K 3.7 3.6  --  5.3* 5.8* 5.4* 4.8 3.9  CL 103 101  --  98 95* 93* 93* 92*  CO2 22  --   --  21* 18* 16* 17* 19*  GLUCOSE 104* 101*  --  253* 367* 360* 360* 255*  BUN 102* 108*  --  116* 145* 149* 170* 143*  CREATININE 5.68* 6.30*  --  6.85* 8.49* 8.64* 9.54* 7.97*  CALCIUM 7.6*  --   --  7.9* 7.4* 7.5* 7.1* 7.6*  PHOS  --   --  4.9*  --  9.7* 9.7* 10.1*  --    CBC Recent Labs  Lab 01/15/22 1028  01/16/22 0410 01/16/22 1758 01/17/22 0441 01/18/22 0339 01/19/22 0321  WBC 14.5* 15.1*  --  15.3* 16.5* 20.1*  NEUTROABS 11.4*  --   --  14.0*  --   --   HGB 9.3* 7.8* 8.3* 8.1* 8.1* 8.0*  HCT 29.4* 25.2* 25.9* 25.9* 24.6* 24.7*  MCV 91.9 93.3  --  92.8 88.8 88.8  PLT 192 169  --  163 151 179  Medications:     aspirin EC  81 mg Oral Daily   calcitRIOL  0.25 mcg Oral Daily   Chlorhexidine Gluconate Cloth  6 each Topical Q0600   heparin  5,000 Units Subcutaneous Q12H   insulin aspart  0-9 Units Subcutaneous TID WC   insulin aspart  8 Units Subcutaneous TID WC   insulin detemir  30 Units Subcutaneous BID   pantoprazole (PROTONIX) IV  40 mg Intravenous Q12H   predniSONE  40 mg Oral Q breakfast   sodium chloride flush  10-40 mL Intracatheter Q12H   sodium chloride flush  3 mL Intravenous Q12H   sodium chloride flush  3 mL Intravenous Q12H   [START ON 01/20/2022] sodium zirconium cyclosilicate  10 g Oral Daily    Madelon Lips, MD 01/19/2022, 12:03 PM

## 2022-01-19 NOTE — Progress Notes (Signed)
Pt converted to rapid a fib during HD. Started on cardizem gtts

## 2022-01-19 NOTE — Significant Event (Addendum)
Rapid Response Event Note   Reason for Call :  Confusion and tachycardia during pt's first HD session  Initial Focused Assessment:  Pt lying in bed with eyes open, in no visible distress. Pt is alert and oriented to self, place, and time, however confused. He answers questions appropriately at times, but this is inconsistent. He follows commands, however, sometimes he needs to be asked repeatedly to do this. His NIH is 7 for LOC questions(not knowing the month) and BLE weakness. R pupil-4, L-2, both reactive. Skin hot to touch.   T-98.4(O), HR-122, BP-140/79, RR-20, SpO2-100% on 4L [place].   Interventions:  CBG-258 AM labs obtained in HD-BMP, CBC EKG-SVT with premature supraventricular complexes with occasional PVCs, nonspecific T wave abnormality Plan of Care:  HD stopped after 1 hour d/t above symptoms. No fluid was pulled from pt. Return pt to 5W. Please check rectal temp once on 5W, await lab values, and any MD orders. Please call RRT if further assistance needed.   Event Summary:   MD Notified: Dr. Bridgett Larsson Call [phone] Arrival [phone] End [id number]  Dillard Essex, RN   Update: [phone] HR-140s afib, BP-116/78, T-98.0 rectal. Plan: cardizem gtt

## 2022-01-19 NOTE — Consult Note (Addendum)
Cardiology Consultation:   Patient ID: [name] MRN: [id number]; DOB: [birth year]  Admit date: 01/15/2022 Date of Consult: 01/19/2022  PCP:  Sonia Side., FNP   [facility] Providers Cardiologist:  None     Patient Profile:   [name] is a 66 y.o. male with a hx of CKD stage V, insulin dependent diabetes, HFpEF, HTN, HLD, stage III prostate cancer on hormone manipulation and prednisone therapy who is being seen 01/19/2022 for the evaluation of atrial fibrillation at the request of Dr. Sloan Leiter.  History of Present Illness:   [name] is a 66 year old male with above medical history. Per chart review, patient was followed by Dr. Caryl Comes from 2007-2013 for treatment of paroxysmal atrial tachycardia. Noted that he had developed a cardiomyopathy secondary to his atrial tachycardia,  however it improved with treatment of the atrial tachycardia and Echocardiogram from 02/05/2012 showed EF 55-60%. He was lost to follow up and has not seen a cardiologist since 06/08/2011.  Patient presented to the ED on 01/15/2022 complaining of hypoglycemia. Patient had outpatient surgery approximately 1 week prior for a dialysis fistula. He had slid off his bed 3 days prior to presentation, and he presented complaining of knee and arm pain. He was admitted to the [facility] service, and was found to have gouty arthritis that involved his left knee and worsening transaminitis. He underwent echocardiogram on 8/16 that showed EF 55-60%, no regional wall motion abnormalities, mild LVH, normal RV systolic function.  Also noted that he had progressed from CKD stage V to ESRD and was started on HD 8/18.   Patient had his first dialysis session on 8/18, however it was cut short because he went into afib with rvr. EKG from 8/18 at 11:13 AM showed atrial fibrillation with HR 143 BPM.   Labs this AM showed Na 130, K 3.9, creatinine 7.97, albumin 1.6, AST 345, ALT 129, WBC 20.1, hemoglobin 8.0, platelets 179.   On  interview, patient is a poor historian. He notes his history of atrial tachycardia, but he denies any history of atrial fibrillation. He has not seen a cardiologist for about 10 years. This AM, patient had his first dialysis appointment. He was treated for about 1 hour, then he was noted to go into afib with RVR. HR increased up to 150s. Patient denies having any chest pain, palpitations, or SOB during the episode. Patient did convert to NSR at approx 0900 this AM, has been maintaining sinus rhythm since with HR in the 60s. Currently, patient is chest pain free. He denies any palpitations, sob, dizziness.   Of note, patient has been bleeding from his HD catheter. About 1 hour prior to my evaluation,  IR came by and put a new suture in the catheter. Currently, bleeding is controlled   Past Medical History:  Diagnosis Date   Arthritis    Cardiomyopathy    Resolved with EF 55% 2011   CARDIOMYOPATHY 04/22/2006   CARDIOMYOPATHY 04/22/2006   2- D Echo (04/2010) : The EF is probably 55% with some hypokinesis at the base of the inferior wall. Wall thickness was increased in a pattern of mild LVH. Patient followed by Dr Martinique (Cardiology)      CHF (congestive heart failure) ([place])    Chronic kidney disease    Diabetes mellitus    DIABETIC  RETINOPATHY 02/06/2007   GERD (gastroesophageal reflux disease)    Hyperlipemia    Hypertension    Iron deficiency 04/09/2018   NEPHROPATHY,  DIABETIC 06/08/2006   Paroxysmal atrial tachycardia [facility])     Past Surgical History:  Procedure Laterality Date   AV FISTULA PLACEMENT Left 06/06/2021   Procedure: LEFT BRACHIOCEPHALIC ARTERIOVENOUS (AV) FISTULA CREATION;  Surgeon: Broadus John, MD;  Location: [place];  Service: Vascular;  Laterality: Left;  PERIPHERAL NERVE BLOCK   CARDIAC CATHETERIZATION  Dec 2007   normal; EF- 20-25%   FISTULA SUPERFICIALIZATION Left 01/09/2022   Procedure: LEFT BRACHIOCEPHALIC FISTULA SUPERFICIALIZATION;  Surgeon: Broadus John, MD;   Location: [place];  Service: Vascular;  Laterality: Left;  PERIPHERAL NERVE BLOCK   IR FLUORO GUIDE CV LINE RIGHT  01/18/2022   IR US GUIDE VASC ACCESS RIGHT  01/18/2022     Home Medications:  Prior to Admission medications   Medication Sig Start Date End Date Taking? Authorizing Provider  abiraterone acetate (ZYTIGA) 250 MG tablet Take 1,000 mg by mouth daily. 09/21/21  Yes [provider]  acetaminophen (TYLENOL) 500 MG tablet Take 500-1,000 mg by mouth every 6 (six) hours as needed for moderate pain.   Yes [provider]  aspirin 81 MG tablet Take 1 tablet (81 mg total) by mouth daily. 12/19/15  Yes Debbrah Alar, NP  atorvastatin (LIPITOR) 80 MG tablet Take 1 tablet (80 mg total) by mouth daily. 03/02/19  Yes Katherine Roan, MD  calcitRIOL (ROCALTROL) 0.25 MCG capsule Take 0.25 mcg by mouth daily. 11/01/20  Yes [provider]  carvedilol (COREG) 25 MG tablet TAKE 1 TABLET BY MOUTH TWICE DAILY WITH A MEAL 05/27/19  Yes Katherine Roan, MD  furosemide (LASIX) 80 MG tablet Take 80 mg by mouth 2 (two) times daily. 12/09/19  Yes [provider]  glipiZIDE (GLUCOTROL) 10 MG tablet Take 10 mg by mouth 2 (two) times daily. 07/22/20  Yes [provider]  hydrALAZINE (APRESOLINE) 50 MG tablet Take 1.5 tablets (75 mg total) by mouth 3 (three) times daily. Patient taking differently: Take 50 mg by mouth in the morning and at bedtime. 05/04/19  Yes Jeanmarie Hubert, MD  INSULIN LISPRO, 1 UNIT DIAL, Energy Inject 15 Units into the skin 3 (three) times daily. 200 units/ml 05/20/19  Yes [provider]  LEVEMIR 100 UNIT/ML injection Inject 40 Units into the skin 2 (two) times daily. 08/23/20  Yes [provider]  oxyCODONE (OXY IR/ROXICODONE) 5 MG immediate release tablet Take 1 tablet (5 mg total) by mouth every 6 (six) hours as needed (for pain score of 1-4). 01/09/22  Yes Ulyses Amor, PA-C  predniSONE (DELTASONE) 5 MG tablet Take 5 mg by  mouth daily. 12/25/21  Yes [provider]  BAYER MICROLET LANCETS lancets Check blood sugar 3 times a day as instructed 10/03/17   Katherine Roan, MD  Blood Glucose Monitoring Suppl (ONE TOUCH ULTRA MINI) w/Device KIT 1 strip by Other route 3 (three) times daily. 11/24/15   Corky Sox, MD  glucose blood (ONE TOUCH ULTRA TEST) test strip The patient is insulin requiring, ICD 10 code E11.65. The patient tests 3 times per day. 05/16/18   Forde Dandy, PharmD  Insulin Pen Needle 31G X 6 MM MISC 1 Stick by Other route 3 (three) times daily. Use with insulin and liraglutide. 10/03/17   Katherine Roan, MD  metFORMIN (GLUCOPHAGE) 500 MG tablet Take 1 tablet (500 mg total) by mouth 2 (two) times daily with a meal. Patient not taking: Reported on 06/05/2021 05/06/18 05/06/19  Katherine Roan, MD  insulin aspart protamine -  aspart (NOVOLOG MIX 70/30 FLEXPEN) (70-30) 100 UNIT/ML FlexPen Inject 60U into the skin in the morning with breakfast and in the evening 50U with dinner 09/29/18 03/02/19  Katherine Roan, MD    Inpatient Medications: Scheduled Meds:  aspirin EC  81 mg Oral Daily   calcitRIOL  0.25 mcg Oral Daily   Chlorhexidine Gluconate Cloth  6 each Topical Q0600   feeding supplement (NEPRO CARB STEADY)  237 mL Oral BID BM   heparin  5,000 Units Subcutaneous Q12H   insulin aspart  0-9 Units Subcutaneous TID WC   insulin aspart  8 Units Subcutaneous TID WC   insulin detemir  30 Units Subcutaneous BID   multivitamin  1 tablet Oral QHS   pantoprazole (PROTONIX) IV  40 mg Intravenous Q12H   predniSONE  40 mg Oral Q breakfast   sodium chloride flush  10-40 mL Intracatheter Q12H   sodium chloride flush  3 mL Intravenous Q12H   sodium chloride flush  3 mL Intravenous Q12H   [START ON 01/20/2022] sodium zirconium cyclosilicate  10 g Oral Daily   Continuous Infusions:  sodium chloride     sodium chloride     anticoagulant sodium citrate     desmopressin (DDAVP) 37.2 mcg in sodium  chloride 0.9 % 50 mL IVPB 37.2 mcg (01/19/22 1449)   diltiazem (CARDIZEM) infusion 5 mg/hr (01/19/22 1242)   PRN Meds: sodium chloride, sodium chloride, acetaminophen, hydrALAZINE, HYDROmorphone (DILAUDID) injection, lidocaine (PF), lidocaine-prilocaine, ondansetron (ZOFRAN) IV, pentafluoroprop-tetrafluoroeth, sodium chloride flush, sodium chloride flush, sodium chloride flush  Allergies:   No Known Allergies  Social History:   Social History   Socioeconomic History   Marital status: Single    Spouse name: Not on file   Number of children: Not on file   Years of education: Not on file   Highest education level: Not on file  Occupational History   Not on file  Tobacco Use   Smoking status: Never    Passive exposure: Never   Smokeless tobacco: Never  Vaping Use   Vaping Use: Never used  Substance and Sexual Activity   Alcohol use: No    Alcohol/week: 0.0 standard drinks of alcohol   Drug use: No   Sexual activity: Not on file  Other Topics Concern   Not on file  Social History Narrative   Not on file   Social Determinants of Health   Financial Resource Strain: Not on file  Food Insecurity: Not on file  Transportation Needs: Not on file  Physical Activity: Not on file  Stress: Not on file  Social Connections: Not on file  Intimate Partner Violence: Not on file    Family History:    Family History  Problem Relation Age of Onset   Diabetes Mother    Diabetes Brother      ROS:  Please see the history of present illness.   All other ROS reviewed and negative.     Physical Exam/Data:   Vitals:   01/19/22 0700 01/19/22 0800 01/19/22 0801 01/19/22 1300  BP: (!) 127/95 137/71  (!) 119/94  Pulse: 92 96  62  Resp: 20  17 18   Temp:  97.7 F (36.5 C)    TempSrc:  Oral    SpO2: 98% 98%  98%  Weight:      Height:        Intake/Output Summary (Last 24 hours) at 01/19/2022 1501 Last data filed at 01/19/2022 0240 Gross per 24 hour  Intake --  Output  0 ml  Net 0  ml      01/19/2022    4:47 AM 01/18/2022    5:00 AM 01/17/2022    4:20 AM  Last 3 Weights  Weight (lbs) 274 lb 11.1 oz 272 lb 7.8 oz 272 lb 14.9 oz  Weight (kg) 124.6 kg 123.6 kg 123.8 kg     Body mass index is 38.31 kg/m.  General:  Well nourished, well developed, in no acute distress. Sitting comfortably in the bed. Normal WOB   HEENT: normal Neck: no JVD Vascular: Radial pulses 2+ bilaterally Cardiac:  normal S1, S2; RRR; no murmur  Lungs:  clear to auscultation bilaterally, no wheezing, rhonchi or rales  Abd: soft, nontender, no hepatomegaly  Ext: 1+ pitting edema in BLE Musculoskeletal:  No deformities, BUE and BLE strength normal and equal Skin: warm and dry  Neuro:  CNs 2-12 intact, no focal abnormalities noted Psych:  Normal affect   EKG:  The EKG was personally reviewed and demonstrates:  atrial fibrillation, HR with HR 143 BPM. Telemetry:  Telemetry was personally reviewed and demonstrates:  Patient was in Afib with RVR until 0900 when patient converted to NSR. He is maintaining NRS with occasional PACs.   Relevant CV Studies:  Echocardiogram 01/17/2022 1. Left ventricular ejection fraction, by estimation, is 55 to 60%. The  left ventricle has normal function. The left ventricle has no regional  wall motion abnormalities. There is mild concentric left ventricular  hypertrophy. Left ventricular diastolic  parameters were normal.   2. Right ventricular systolic function is normal. The right ventricular  size is normal. There is normal pulmonary artery systolic pressure.   3. The mitral valve is normal in structure. Trivial mitral valve  regurgitation. No evidence of mitral stenosis.   4. The aortic valve is tricuspid. Aortic valve regurgitation is not  visualized. No aortic stenosis is present.   5. The inferior vena cava is normal in size with greater than 50%  respiratory variability, suggesting right atrial pressure of 3 mmHg.   Laboratory Data:  High  Sensitivity Troponin:  No results for input(s): "TROPONINIHS" in the last 720 hours.   Chemistry Recent Labs  Lab 01/15/22 1323 01/16/22 0410 01/17/22 0441 01/17/22 1005 01/18/22 0339 01/19/22 0321  NA  --    < > 132* 129* 129* 130*  K  --    < > 5.8* 5.4* 4.8 3.9  CL  --    < > 95* 93* 93* 92*  CO2  --    < > 18* 16* 17* 19*  GLUCOSE  --    < > 367* 360* 360* 255*  BUN  --    < > 145* 149* 170* 143*  CREATININE  --    < > 8.49* 8.64* 9.54* 7.97*  CALCIUM  --    < > 7.4* 7.5* 7.1* 7.6*  MG 1.9  --  2.4  --   --   --   GFRNONAA  --    < > 6* 6* 5* 7*  ANIONGAP  --    < > 19* 20* 19* 19*   < > = values in this interval not displayed.    Recent Labs  Lab 01/17/22 0441 01/17/22 1005 01/18/22 0339 01/18/22 1042 01/19/22 0321  PROT 6.4*  --   --  6.0* 6.1*  ALBUMIN 1.7*   < > 1.5* 1.6* 1.6*  AST 510*  --   --  456* 345*  ALT 123*  --   --  132* 129*  ALKPHOS 133*  --   --  125 133*  BILITOT 1.3*  --   --  0.7 0.9   < > = values in this interval not displayed.   Lipids No results for input(s): "CHOL", "TRIG", "HDL", "LABVLDL", "LDLCALC", "CHOLHDL" in the last 168 hours.  Hematology Recent Labs  Lab 01/17/22 0441 01/18/22 0339 01/19/22 0321  WBC 15.3* 16.5* 20.1*  RBC 2.79* 2.77* 2.78*  HGB 8.1* 8.1* 8.0*  HCT 25.9* 24.6* 24.7*  MCV 92.8 88.8 88.8  MCH 29.0 29.2 28.8  MCHC 31.3 32.9 32.4  RDW 14.4 14.3 14.2  PLT 163 151 179   Thyroid  Recent Labs  Lab 01/15/22 1028  TSH 0.874    BNP Recent Labs  Lab 01/15/22 1028  BNP 497.7*    DDimer No results for input(s): "DDIMER" in the last 168 hours.   Radiology/Studies:  IR Fluoro Guide CV Line Right  Result Date: 01/18/2022 INDICATION: 66 year old male with worsening end-stage renal failure requiring initiation of hemodialysis. Temporary hemodialysis catheter requested. EXAM: NON-TUNNELED CENTRAL VENOUS HEMODIALYSIS CATHETER PLACEMENT WITH ULTRASOUND AND FLUOROSCOPIC GUIDANCE COMPARISON:  None Available.  MEDICATIONS: None FLUOROSCOPY TIME:  One mGy COMPLICATIONS: None immediate. PROCEDURE: Informed written consent was obtained from the patient after a discussion of the risks, benefits, and alternatives to treatment. Questions regarding the procedure were encouraged and answered. The right neck and chest were prepped with chlorhexidine in a sterile fashion, and a sterile drape was applied covering the operative field. Maximum barrier sterile technique with sterile gowns and gloves were used for the procedure. A timeout was performed prior to the initiation of the procedure. After the overlying soft tissues were anesthetized, a small venotomy incision was created and a micropuncture kit was utilized to access the internal jugular vein. Real-time ultrasound guidance was utilized for vascular access including the acquisition of a permanent ultrasound image documenting patency of the accessed vessel. The microwire was utilized to measure appropriate catheter length. A Rosen wire was advanced to the level of the IVC. Under fluoroscopic guidance, the venotomy was serially dilated, ultimately allowing placement of a 20 cm temporary Trialysis catheter with tip ultimately terminating within the superior aspect of the right atrium. Final catheter positioning was confirmed and documented with a spot radiographic image. The catheter aspirates and flushes normally. The catheter was flushed with appropriate volume heparin dwells. The catheter exit site was secured with a 0 silk retention suture. A dressing was placed. The patient tolerated the procedure well without immediate post procedural complication. IMPRESSION: Successful placement of a right internal jugular approach 20 cm temporary dialysis catheter with tip terminating with in the superior aspect of the right atrium. The catheter is ready for immediate use. PLAN: This catheter may be converted to a tunneled dialysis catheter at a later date as indicated. Ruthann Cancer, MD  [facility] Radiology Electronically Signed   By: Ruthann Cancer M.D.   On: 01/18/2022 10:04   US Abdomen Limited RUQ (LIVER/GB)  Result Date: 01/17/2022 CLINICAL DATA:  Elevated LFTs EXAM: ULTRASOUND ABDOMEN LIMITED RIGHT UPPER QUADRANT COMPARISON:  None Available. FINDINGS: Gallbladder: No gallstones or wall thickening visualized. No sonographic Murphy sign noted by sonographer. Common bile duct: Diameter: 3.7 mm Liver: No focal lesion identified. Within normal limits in parenchymal echogenicity. Portal vein is patent on color Doppler imaging with normal direction of blood flow towards the liver. Other: None. IMPRESSION: Normal right upper quadrant ultrasound. Electronically Signed   By: Warren Lacy  Dagoberto Reef M.D.   On: 01/17/2022 19:30   ECHOCARDIOGRAM COMPLETE  Result Date: 01/17/2022    ECHOCARDIOGRAM REPORT   Patient Name:   [name] Date of Exam: 01/17/2022 Medical Rec #:  [id number]      Height:       71.0 in Accession #:    [id number]     Weight:       272.9 lb Date of Birth:  [birth year]       BSA:          2.406 m Patient Age:    35 years       BP:           123/65 mmHg Patient Gender: M              HR:           84 bpm. Exam Location:  Inpatient Procedure: 2D Echo, Cardiac Doppler, Color Doppler and Intracardiac            Opacification Agent Indications:    Abnormal CXR  History:        Patient has prior history of Echocardiogram examinations, most                 recent 04/20/2019. CHF; Risk Factors:Hypertension, Diabetes and                 Dyslipidemia.  Sonographer:    Jefferey Pica Referring Phys: [phone] A CALDWELL POWELL JR IMPRESSIONS  1. Left ventricular ejection fraction, by estimation, is 55 to 60%. The left ventricle has normal function. The left ventricle has no regional wall motion abnormalities. There is mild concentric left ventricular hypertrophy. Left ventricular diastolic parameters were normal.  2. Right ventricular systolic  function is normal. The right ventricular size is normal. There is normal pulmonary artery systolic pressure.  3. The mitral valve is normal in structure. Trivial mitral valve regurgitation. No evidence of mitral stenosis.  4. The aortic valve is tricuspid. Aortic valve regurgitation is not visualized. No aortic stenosis is present.  5. The inferior vena cava is normal in size with greater than 50% respiratory variability, suggesting right atrial pressure of 3 mmHg. FINDINGS  Left Ventricle: Left ventricular ejection fraction, by estimation, is 55 to 60%. The left ventricle has normal function. The left ventricle has no regional wall motion abnormalities. The left ventricular internal cavity size was normal in size. There is  mild concentric left ventricular hypertrophy. Left ventricular diastolic parameters were normal. Right Ventricle: The right ventricular size is normal. No increase in right ventricular wall thickness. Right ventricular systolic function is normal. There is normal pulmonary artery systolic pressure. The tricuspid regurgitant velocity is 2.74 m/s, and  with an assumed right atrial pressure of 5 mmHg, the estimated right ventricular systolic pressure is 75.6 mmHg. Left Atrium: Left atrial size was normal in size. Right Atrium: Right atrial size was normal in size. Pericardium: There is no evidence of pericardial effusion. Mitral Valve: The mitral valve is normal in structure. Trivial mitral valve regurgitation. No evidence of mitral valve stenosis. Tricuspid Valve: The tricuspid valve is normal in structure. Tricuspid valve regurgitation is trivial. No evidence of tricuspid stenosis. Aortic Valve: The aortic valve is tricuspid. Aortic valve regurgitation is not visualized. No aortic stenosis is present. Aortic valve peak gradient measures 8.9 mmHg. Pulmonic Valve: The pulmonic valve was not well visualized. Pulmonic valve regurgitation is not visualized. No evidence of pulmonic stenosis. Aorta:  The aortic root is normal in size  and structure. Venous: The inferior vena cava is normal in size with greater than 50% respiratory variability, suggesting right atrial pressure of 3 mmHg. IAS/Shunts: No atrial level shunt detected by color flow Doppler.  LEFT VENTRICLE PLAX 2D LVIDd:         5.50 cm   Diastology LVIDs:         3.70 cm   LV e' medial:    7.49 cm/s LV PW:         1.20 cm   LV E/e' medial:  13.0 LV IVS:        1.20 cm   LV e' lateral:   7.56 cm/s LVOT diam:     2.00 cm   LV E/e' lateral: 12.9 LV SV:         82 LV SV Index:   34 LVOT Area:     3.14 cm  RIGHT VENTRICLE             IVC RV S prime:     12.80 cm/s  IVC diam: 2.00 cm TAPSE (M-mode): 2.6 cm LEFT ATRIUM           Index        RIGHT ATRIUM           Index LA diam:      4.10 cm 1.70 cm/m   RA Area:     17.00 cm LA Vol (A4C): 68.8 ml 28.59 ml/m  RA Volume:   39.30 ml  16.33 ml/m  AORTIC VALVE                 PULMONIC VALVE AV Area (Vmax): 2.99 cm     PV Vmax:       0.89 m/s AV Vmax:        149.00 cm/s  PV Peak grad:  3.2 mmHg AV Peak Grad:   8.9 mmHg LVOT Vmax:      142.00 cm/s LVOT Vmean:     78.300 cm/s LVOT VTI:       0.260 m  AORTA Ao Root diam: 3.50 cm Ao Asc diam:  3.40 cm MITRAL VALVE               TRICUSPID VALVE MV Area (PHT): 7.09 cm    TR Peak grad:   30.0 mmHg MV Decel Time: 107 msec    TR Vmax:        274.00 cm/s MV E velocity: 97.30 cm/s MV A velocity: 47.70 cm/s  SHUNTS MV E/A ratio:  2.04        Systemic VTI:  0.26 m                            Systemic Diam: 2.00 cm Kardie Tobb DO Electronically signed by Berniece Salines DO Signature Date/Time: 01/17/2022/11:57:08 AM    Final    US RENAL  Result Date: 01/16/2022 CLINICAL DATA:  AKI. EXAM: RENAL / URINARY TRACT ULTRASOUND COMPLETE COMPARISON:  08/17/2021. FINDINGS: Right Kidney: Renal measurements: 10.5 x 5.4 x 4.8 cm = volume: 142.05 mL. Echogenicity within normal limits. No mass or hydronephrosis visualized. Left Kidney: Renal measurements: 10.3 x 5.9 x 4.6 cm = volume:  146.72 mL. Echogenicity within normal limits. An avascular hypoechoic region is noted in the mid left kidney measuring 2.5 x 1.7 cm, possible cyst. No mass or hydronephrosis visualized. Bladder: Appears normal for degree of bladder distention. Other: Examination is limited due to patient's body habitus and limited mobility.  IMPRESSION: 1. Avascular hypoechoic structure in the mid left kidney, possible cyst. 2. Normal evaluation of the right kidney and bladder. Electronically Signed   By: Brett Fairy M.D.   On: 01/16/2022 20:16   MR BRAIN WO CONTRAST  Result Date: 01/16/2022 CLINICAL DATA:  Neuro deficit, acute, stroke suspected. EXAM: MRI HEAD WITHOUT CONTRAST TECHNIQUE: Multiplanar, multiecho pulse sequences of the brain and surrounding structures were obtained without intravenous contrast. COMPARISON:  01/15/2022 CT FINDINGS: Brain: Diffusion imaging does not show any acute or subacute infarction or other cause of restricted diffusion. There is generalized brain atrophy, advanced for age. Old small vessel infarctions affect the central pons. Few old small vessel cerebellar infarctions. Cerebral hemispheres show old infarctions of the left basal ganglia and radiating white matter tracts. Mild chronic small-vessel ischemic change seen elsewhere. No large vessel territory infarction. No mass, hemorrhage, hydrocephalus or extra-axial collection. Vascular: Major vessels at the base of the brain show flow. Skull and upper cervical spine: Negative Sinuses/Orbits: Clear/normal Other: None IMPRESSION: No acute MR finding. Accelerated generalized atrophy. 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 Electronically Signed   By: Nelson Chimes M.D.   On: 01/16/2022 16:12   DG Hand 2 View Left  Result Date: 01/16/2022 CLINICAL DATA:  Pain and swelling after falling from bed. EXAM: LEFT HAND - 2  VIEW COMPARISON:  None Available. FINDINGS: Generalized regional swelling. No evidence fracture or dislocation. Small erosions are noted at several of the inter phalangeal joints which could be seen with erosive arthritis including erosive osteoarthritis. IMPRESSION: No acute finding other than soft tissue swelling. 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 Electronically Signed   By: Nelson Chimes M.D.   On: 01/16/2022 15:07   DG Chest 1 View  Result Date: 01/16/2022 CLINICAL DATA:  Sepsis. EXAM: CHEST  1 VIEW COMPARISON:  01/15/2022 FINDINGS: 0505 hours. The cardio pericardial silhouette is enlarged. Low volume film. Bibasilar atelectasis noted. There is pulmonary vascular congestion without overt pulmonary edema. Telemetry leads overlie the chest. IMPRESSION: Enlarged cardiopericardial silhouette with vascular congestion. Electronically Signed   By: Misty Stanley M.D.   On: 01/16/2022 06:00     Assessment and Plan:   New onset Atrial Fibrillation with RVR  - Patient was noted to go into afib with RVR during his first HD session today  - Echo this admission showed EF 55-60% no regional wall motion abnormalities, mild LVH, normal RV systolic function, no hemodynamically significant valvular abnormalities  - Patient was started on cardizem infusion -- converted to NSR this AM at 0900 and has been maintaining sinus rhythm with PACs - Stop IV diltiazem  - Start PO diltiazem 120 mg daily   - Agree with holding anticoagulation until patient is no longer bleeding from HD catheter site - Consider having patient wear a cardiac monitor in the outpatient setting to assess afib burden    Otherwise per primary  - SIRS due to acute gouty arthritis  - Acute metabolic encephalopathy  - Normocytic anemia - Transaminitis  - Type 2 DM    Risk Assessment/Risk Scores:      CHA2DS2-VASc Score = 4  This indicates a 4.8% annual risk of stroke. The patient's score is  based upon: CHF History: 1 HTN History: 1 Diabetes History: 1 Stroke History: 0 Vascular Disease History: 0 Age Score: 1 Gender Score: 0  For questions or updates, please contact [place] Please consult [email or web address] for contact info under    Signed, Margie Billet, PA-C  01/19/2022 3:01 PM  I have seen and examined the patient along with Margie Billet, PA-C , .  I have reviewed the chart, notes and new data.  I agree with PA/NP's note.  Key new complaints: feels weak, but no dyspnea or chest pain Key examination changes: now in RRR (sinus 60s on telemetry), no murmurs or rubs. Clear lungs. No active bleeding at the HD cath site. Has some tremors and myoclonus (uremic?), but alert and oriented Key new findings / data: reviewed echo - no signs of endocarditis by TTE. Normal LV systolic and diastolic function. The left atrium is actually mildly dilated.  PLAN: New onset paroxysmal atrial fibrillation. He has been chronically on beta blockers for >10 years and has a history of atrial tachycardia. He has not received beta blockers since admission due to hypotension. Combination of uremia, sepsis and beta blocker withdrawal may be responsible for his episode of atrial fibrillation.  Will stop the diltiazem and restart carvedilol (initially at half of his previous dose, since he may not tolerate as much with HD). Increase back to 25 mg twice daily if BP tolerates. Meets criteria for anticoagulation (CHADSVasc 4) , but he is back to normal rhythm and has had issues with bleeding at his HD cath site. Can delay initiation of DOAC for a while. He should receive Eliquis 5 mg twice daily when ready.   Sanda Klein, MD, [place] [phone] 01/19/2022, 3:44 PM

## 2022-01-19 NOTE — Progress Notes (Signed)
RN contacted IR [phone] due to bleeding at IJ. PA following case for IR to come to bedside and assess site.

## 2022-01-19 NOTE — TOC Progression Note (Signed)
Transition of Care [facility]) - Progression Note    Patient Details  Name: JONNATAN HANNERS MRN: [id number] Date of Birth: [birth year]  [facility]) CM/SW [place], LCSW Phone Number: 01/19/2022, 4:22 PM  Clinical Narrative:    CSW provided SNF bed offers to patient and his son at bedside.    Expected Discharge Plan: Skilled Nursing Facility Barriers to Discharge: Ship broker, Continued Medical Work up, Waiting for outpatient dialysis, SNF Pending bed offer  Expected Discharge Plan and Services Expected Discharge Plan: [place] In-house Referral: Clinical Social Work   Post Acute Care Choice: [place] Living arrangements for the past 2 months: Single Family Home                                       Social Determinants of Health (SDOH) Interventions    Readmission Risk Interventions     No data to display

## 2022-01-19 NOTE — Progress Notes (Addendum)
Received patient in bed to [name].  Alert and oriented.  Informed consent signed and in  chart.   Treatment initiated: 0140 Treatment completed: 0240  HD catheter placement verified, ready for use per Dr. Ruthann Cancer.  Today is patient's first HD session. He did not tolerate treatment. An hour after treatment initiated, patient was tachycardic and was in and out on Afib and confused, but sometimes answers to questions appropriately. Treatment was ended and rapid response RN was called. Patient was transported back to his room by dialysis nurse. Report was given by rapid response nurse to bedside nurse.  Access used: HD catheter Access issues: HD cathter was bleeding before patient arrived here, bedside RN managed to stop the bleeding but it started again a few minutes during dialysis. Pressure was held on site, dressing was changed. Pressure dressing applied. Not actively bleeding as of this time.  Total UF removed: 0 Medication(s) given: none Post HD VS: 98.4-140/79-122-19-100% '@4L'$  via [facility] HD weight: unable to obtain  Addendum: Dr. Hollie Salk was notified    [name]

## 2022-01-19 NOTE — Procedures (Signed)
  Called re: continued bleeding from temporary HD catheter.  On exam, evidence seen of persistent bleeding with blood on the neck, gown, and bedding.  I removed the saturated gauze and cleaned the area with Chloraprep.  I removed the existing suture because it was full of clotted blood.  I used 1% lidocaine for local anesthesia at the catheter exit site.  Next I used a 0 Silk suture in a circumferential fashion around the  catheter and exit site using a figure 8 stitch to ligated and stop the bleeding.   I monitored the site for about 8 minutes. There was no further bleeding.  I placed a Bio-patch and a Tegaderm over the site.  Karma Ansley S Arles Rumbold PA-C 01/19/2022 1:18 PM

## 2022-01-19 NOTE — Progress Notes (Signed)
PROGRESS NOTE        PATIENT DETAILS Name: [name] Age: 66 y.o. Sex: male Date of Birth: 01/16/1956 Admit Date: 01/15/2022 Admitting Physician Lequita Halt, MD PPH:[id number], Malva Limes., FNP  Brief Summary: Patient is a 66 y.o.  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  See below for further details.  Significant events: 8/14>> admit for confusion/gouty arthritis/progression to ESRD. 8/16>> worsening transaminases-renal planning HD. 8/17>> TDC placed-first HD planned. 8/18>> first HD cut short-A-fib RVR-Cardizem drip started.  Oozing from nontunneled HD cath site.  Significant studies: 8/15>> MRI brain: No acute CVA. 8/15>> renal ultrasound: No hydronephrosis. 8/14>> left knee synovial fluid: WBC 23,835, extracellular sodium urate crystals. 8/15>> right knee synovial fluid: WBC 15,600, intracellular monosodium urate crystals. 8/16>> Echo: EF 55-60% 8/16>> RUQ ultrasound: No major abnormalities noted.  Significant microbiology data: 8/14>> blood culture: No growth 8/14>> left knee synovial fluid: No growth 8/15>> right knee synovial fluid: No growth  Procedures: 8/14>> left knee arthrocentesis 8/15>> right knee arthrocentesis 8/17>> tunneled HD catheter placement by IR.  Consults: Nephrology. Orthopedics Cardiology  Subjective: Slow but awake/alert.  Objective: Vitals: Blood pressure 137/71, pulse 96, temperature 97.7 F (36.5 C), temperature source Oral, resp. rate 17, height 5' 11"  (1.803 m), weight 124.6 kg, SpO2 98 %.   Exam: Gen Exam: Slightly slow but not in any distress. HEENT:atraumatic, normocephalic Chest: B/L clear to auscultation anteriorly CVS:S1S2 regular Abdomen:soft non tender, non distended Extremities:no  edema Neurology: Non focal Skin: no rash   Pertinent Labs/Radiology:    Latest Ref Rng & Units 01/19/2022    3:21 AM 01/18/2022    3:39 AM 01/17/2022    4:41 AM  CBC  WBC 4.0 - 10.5 K/uL 20.1  16.5  15.3   Hemoglobin 13.0 - 17.0 g/dL 8.0  8.1  8.1   Hematocrit 39.0 - 52.0 % 24.7  24.6  25.9   Platelets 150 - 400 K/uL 179  151  163     Lab Results  Component Value Date   NA 130 (L) 01/19/2022   K 3.9 01/19/2022   CL 92 (L) 01/19/2022   CO2 19 (L) 01/19/2022       Assessment/Plan: SIRS due to acute gouty arthritis involving bilateral knees, numerous joints of left hand: Bilateral knee arthralgias/left hand arthralgias better-synovial fluid cultures negative so far-no longer on IV antibiotics-has been transitioned from IV to oral steroids.  Suspect some amount of arthralgias from uremia as well.  Continue to follow closely.  Plan is to taper down steroids over the next few days.    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  Nephrology following-we will defer further to nephrology.    Oozing from tunneled HD catheter site: Awaiting IR evaluation.  A-fib with RVR: Triggered by uremia/acute illness-rate better controlled with Cardizem infusion-holding anticoagulation until hemostasis obtained from bleeding HD catheter site.  Cardiology consulted today.  Hyperkalemia: Due to worsening renal function-better with Lokelma.  Hopefully will improve with further HD sessions although potassium  better today.  Acute metabolic encephalopathy: Due to uremia-mostly in the form of lethargy.  Continue supportive care.  Hopefully he will continue to improve after more HD sessions.  Normocytic anemia: Due to acute illness/CKD-although FOBT positive-no evidence of overt blood loss.  Defer Aranesp/iron  therapy to nephrology service.  Transaminitis: Unclear etiology-suspect probably medication related-no longer on cefepime and statin-Zytiga on hold.  LFTs are starting to downtrend-we will follow-RUQ ultrasound unremarkable.  Follow LFTs.  Hyponatremia: Related to volume-should improve post HD.  Hypoglycemia: Likely related to use of oral hypoglycemic agents in the setting of worsening renal function.  Continue to watch closely.  DM-2 (A1c 9.6 on 8/14) with uncontrolled hyperglycemia due to steroids: CBGs better-steroids being de-escalated-continue Levemir 30 units twice daily, 18 units of Premeal NovoLog and SSI.  Optimize in follow-up.  Will need to permanently stop glipizide and metformin on discharge.  Recent Labs    01/18/22 2129 01/19/22 0305 01/19/22 0812  GLUCAP 340* 253* 238*     Chronic left arm weakness: Per patient's son-this has been ongoing for the past several months-son has noted a problem with the patient's grip mostly.  MRI brain negative for acute CVA.  We will need to check a MRI C-spine at some point.  Suspect some of it may be due to acute gouty arthritis involving his small hand joints as well.  Prostate cancer: On prednisone/Zytiga-currently on IV Solu-Medrol for gouty flare-Zytiga will remain on hold due to elevated liver function enzymes  Morbid Obesity: Estimated body mass index is 38.31 kg/m as calculated from the following:   Height as of this encounter: 5' 11"  (1.803 m).   Weight as of this encounter: 124.6 kg.   Code status:   Code Status: Full Code   DVT Prophylaxis: heparin injection 5,000 Units Start: 01/15/22 1330   Family Communication: None at bedside.   Disposition Plan: Status is: Inpatient Remains inpatient appropriate because: Likely progression to ESRD-nephrology initiating dialysis-not yet stable for discharge.   Planned Discharge Destination:SNF  Diet: Diet Order             Diet NPO time specified  Diet effective now                      Antimicrobial agents: Anti-infectives (From admission, onward)    Start     Dose/Rate Route Frequency Ordered Stop   01/16/22 1000  ceFEPIme (MAXIPIME) 2 g in sodium chloride 0.9 % 100 mL IVPB  Status:  Discontinued        2 g 200 mL/hr over 30 Minutes Intravenous Every 24 hours 01/15/22 1014 01/17/22 1355   01/15/22 1015  ceFEPIme (MAXIPIME) 2 g in sodium chloride 0.9 % 100 mL IVPB        2 g 200 mL/hr over 30 Minutes Intravenous  Once 01/15/22 1005 01/15/22 1127   01/15/22 1015  metroNIDAZOLE (FLAGYL) IVPB 500 mg        500 mg 100 mL/hr over 60 Minutes Intravenous  Once 01/15/22 1005 01/15/22 1213   01/15/22 1015  vancomycin (VANCOCIN) IVPB 1000 mg/200 mL premix  Status:  Discontinued        1,000 mg 200 mL/hr over 60 Minutes Intravenous  Once 01/15/22 1005 01/15/22 1013   01/15/22 1015  vancomycin (VANCOREADY) IVPB 2000 mg/400 mL        2,000 mg 200 mL/hr over 120 Minutes Intravenous  Once 01/15/22 1013 01/15/22 1325   01/15/22 1011  vancomycin variable dose per unstable renal  function (pharmacist dosing)  Status:  Discontinued         Does not apply See admin instructions 01/15/22 1011 01/18/22 1526        MEDICATIONS: Scheduled Meds:  aspirin EC  81 mg Oral Daily   calcitRIOL  0.25 mcg Oral Daily   Chlorhexidine Gluconate Cloth  6 each Topical Q0600   heparin  5,000 Units Subcutaneous Q12H   insulin aspart  0-9 Units Subcutaneous TID WC   insulin aspart  8 Units Subcutaneous TID WC   insulin detemir  30 Units Subcutaneous BID   pantoprazole (PROTONIX) IV  40 mg Intravenous Q12H   predniSONE  40 mg Oral Q breakfast   sodium chloride flush  10-40 mL Intracatheter Q12H   sodium chloride flush  3 mL Intravenous Q12H   sodium chloride flush  3 mL Intravenous Q12H   [START ON 01/20/2022] sodium zirconium cyclosilicate  10 g Oral Daily   Continuous Infusions:  sodium chloride     sodium chloride     anticoagulant sodium citrate     desmopressin (DDAVP)  37.2 mcg in sodium chloride 0.9 % 50 mL IVPB     diltiazem (CARDIZEM) infusion 10 mg/hr (01/19/22 0652)   PRN Meds:.sodium chloride, sodium chloride, acetaminophen, hydrALAZINE, HYDROmorphone (DILAUDID) injection, lidocaine (PF), lidocaine-prilocaine, ondansetron (ZOFRAN) IV, pentafluoroprop-tetrafluoroeth, sodium chloride flush, sodium chloride flush, sodium chloride flush   I have personally reviewed following labs and imaging studies  LABORATORY DATA: CBC: Recent Labs  Lab 01/15/22 1028 01/16/22 0410 01/16/22 1758 01/17/22 0441 01/18/22 0339 01/19/22 0321  WBC 14.5* 15.1*  --  15.3* 16.5* 20.1*  NEUTROABS 11.4*  --   --  14.0*  --   --   HGB 9.3* 7.8* 8.3* 8.1* 8.1* 8.0*  HCT 29.4* 25.2* 25.9* 25.9* 24.6* 24.7*  MCV 91.9 93.3  --  92.8 88.8 88.8  PLT 192 169  --  163 151 179     Basic Metabolic Panel: Recent Labs  Lab 01/15/22 1323 01/16/22 0410 01/17/22 0441 01/17/22 1005 01/18/22 0339 01/19/22 0321  NA  --  135 132* 129* 129* 130*  K  --  5.3* 5.8* 5.4* 4.8 3.9  CL  --  98 95* 93* 93* 92*  CO2  --  21* 18* 16* 17* 19*  GLUCOSE  --  253* 367* 360* 360* 255*  BUN  --  116* 145* 149* 170* 143*  CREATININE  --  6.85* 8.49* 8.64* 9.54* 7.97*  CALCIUM  --  7.9* 7.4* 7.5* 7.1* 7.6*  MG 1.9  --  2.4  --   --   --   PHOS 4.9*  --  9.7* 9.7* 10.1*  --      GFR: Estimated Creatinine Clearance: 11.9 mL/min (A) (by C-G formula based on SCr of 7.97 mg/dL (H)).  Liver Function Tests: Recent Labs  Lab 01/15/22 0928 01/17/22 0441 01/17/22 1005 01/18/22 0339 01/18/22 1042 01/19/22 0321  AST 80* 510*  --   --  456* 345*  ALT 24 123*  --   --  132* 129*  ALKPHOS 123 133*  --   --  125 133*  BILITOT 1.0 1.3*  --   --  0.7 0.9  PROT 6.1* 6.4*  --   --  6.0* 6.1*  ALBUMIN 1.7* 1.7* 1.6* 1.5* 1.6* 1.6*    No results for input(s): "LIPASE", "AMYLASE" in the last 168 hours. Recent Labs  Lab 01/15/22 1026  AMMONIA 27     Coagulation Profile: Recent Labs  Lab  01/15/22 1028  INR 1.3*     Cardiac Enzymes: No results for input(s): "CKTOTAL", "CKMB", "CKMBINDEX", "TROPONINI" in the last 168 hours.  BNP (last 3 results) No results for input(s): "PROBNP" in the last 8760 hours.  Lipid Profile: No results for input(s): "CHOL", "HDL", "LDLCALC", "TRIG", "CHOLHDL", "LDLDIRECT" in the last 72 hours.  Thyroid Function Tests: No results for input(s): "TSH", "T4TOTAL", "FREET4", "T3FREE", "THYROIDAB" in the last 72 hours.   Anemia Panel: No results for input(s): "VITAMINB12", "FOLATE", "FERRITIN", "TIBC", "IRON", "RETICCTPCT" in the last 72 hours.   Urine analysis:    Component Value Date/Time   COLORURINE YELLOW 05/04/2008 2129   APPEARANCEUR Cloudy (A) 08/19/2015 1403   LABSPEC 1.012 05/04/2008 2129   PHURINE 5.5 05/04/2008 2129   GLUCOSEU Negative 08/19/2015 1403   GLUCOSEU 500 (A) 05/04/2008 2129   HGBUR SMALL (A) 04/22/2008 0848   BILIRUBINUR Negative 08/19/2015 1403   KETONESUR NEG mg/dL 05/04/2008 2129   PROTEINUR 1+ (A) 08/19/2015 1403   PROTEINUR 30 (A) 05/04/2008 2129   UROBILINOGEN 0.2 05/04/2008 2129   NITRITE Negative 08/19/2015 1403   NITRITE NEG 05/04/2008 2129   LEUKOCYTESUR 1+ (A) 08/19/2015 1403    Sepsis Labs: Lactic Acid, Venous    Component Value Date/Time   LATICACIDVEN 2.6 (HH) 01/15/2022 1026    MICROBIOLOGY: Recent Results (from the past 240 hour(s))  Blood Culture (routine x 2)     Status: None (Preliminary result)   Collection Time: 01/15/22 10:04 AM   Specimen: BLOOD RIGHT FOREARM  Result Value Ref Range Status   Specimen Description BLOOD RIGHT FOREARM  Final   Special Requests   Final    BOTTLES DRAWN AEROBIC AND ANAEROBIC Blood Culture adequate volume   Culture   Final    NO GROWTH 4 DAYS Performed at [facility], [place] [address] [place], [place] [address]    Report Status PENDING  Incomplete  Resp Panel by RT-PCR (Flu A&B, Covid) Anterior Nasal Swab     Status: None   Collection  Time: 01/15/22 10:05 AM   Specimen: Anterior Nasal Swab  Result Value Ref Range Status   SARS Coronavirus 2 by RT PCR NEGATIVE NEGATIVE Final    Comment: (NOTE) SARS-CoV-2 target nucleic acids are NOT DETECTED.  The SARS-CoV-2 RNA is generally detectable in upper respiratory specimens during the acute phase of infection. The lowest concentration of SARS-CoV-2 viral copies this assay can detect is 138 copies/mL. A negative result does not preclude SARS-Cov-2 infection and should not be used as the sole basis for treatment or other patient management decisions. A negative result may occur with  improper specimen collection/handling, submission of specimen other than nasopharyngeal swab, presence of viral mutation(s) within the areas targeted by this assay, and inadequate number of viral copies(<138 copies/mL). A negative result must be combined with clinical observations, patient history, and epidemiological information. The expected result is Negative.  Fact Sheet for Patients:  EntrepreneurPulse.com.au  Fact Sheet for Healthcare Providers:  IncredibleEmployment.be  This test is no t yet approved or cleared by the [place] FDA and  has been authorized for detection and/or diagnosis of SARS-CoV-2 by FDA under an Emergency Use Authorization (EUA). This EUA will remain  in effect (meaning this test can be used) for the duration of the COVID-19 declaration under Section 564(b)(1) of the Act, 21 U.S.C.section 360bbb-3(b)(1), unless the authorization is terminated  or revoked sooner.       Influenza A by PCR NEGATIVE NEGATIVE Final  Influenza B by PCR NEGATIVE NEGATIVE Final    Comment: (NOTE) The Xpert Xpress SARS-CoV-2/FLU/RSV plus assay is intended as an aid in the diagnosis of influenza from Nasopharyngeal swab specimens and should not be used as a sole basis for treatment. Nasal washings and aspirates are unacceptable for Xpert Xpress  SARS-CoV-2/FLU/RSV testing.  Fact Sheet for Patients: EntrepreneurPulse.com.au  Fact Sheet for Healthcare Providers: IncredibleEmployment.be  This test is not yet approved or cleared by the [place] FDA and has been authorized for detection and/or diagnosis of SARS-CoV-2 by FDA under an Emergency Use Authorization (EUA). This EUA will remain in effect (meaning this test can be used) for the duration of the COVID-19 declaration under Section 564(b)(1) of the Act, 21 U.S.C. section 360bbb-3(b)(1), unless the authorization is terminated or revoked.  Performed at [facility], [place] [address], [place], [place] [address]   Blood Culture (routine x 2)     Status: None (Preliminary result)   Collection Time: 01/15/22 10:09 AM   Specimen: BLOOD  Result Value Ref Range Status   Specimen Description BLOOD SITE NOT SPECIFIED  Final   Special Requests   Final    BOTTLES DRAWN AEROBIC AND ANAEROBIC Blood Culture adequate volume   Culture   Final    NO GROWTH 4 DAYS Performed at [facility], [place] [address] [place], [place] [address]    Report Status PENDING  Incomplete  MRSA Next Gen by PCR, Nasal     Status: None   Collection Time: 01/15/22  1:28 PM   Specimen: Nasal Mucosa; Nasal Swab  Result Value Ref Range Status   MRSA by PCR Next Gen NOT DETECTED NOT DETECTED Final    Comment: (NOTE) The GeneXpert MRSA Assay (FDA approved for NASAL specimens only), is one component of a comprehensive MRSA colonization surveillance program. It is not intended to diagnose MRSA infection nor to guide or monitor treatment for MRSA infections. Test performance is not FDA approved in patients less than 37 years old. Performed at [facility], [place] [address], [place], [place] [address]   Body fluid culture w Gram Stain     Status: None   Collection Time: 01/15/22  5:59 PM   Specimen: Synovial Fluid  Result Value Ref Range Status   Specimen  Description FLUID SYNOVIAL KNEE LEFT  Final   Special Requests NONE  Final   Gram Stain   Final    ABUNDANT WBC PRESENT, PREDOMINANTLY PMN NO ORGANISMS SEEN    Culture   Final    NO GROWTH 3 DAYS Performed at [facility], [place] [address], [place], [place] [address]    Report Status 01/18/2022 FINAL  Final  Anaerobic culture w Gram Stain     Status: None (Preliminary result)   Collection Time: 01/15/22  5:59 PM   Specimen: Synovial Fluid  Result Value Ref Range Status   Specimen Description FLUID SYNOVIAL KNEE LEFT  Final   Special Requests NONE  Final   Gram Stain   Final    ABUNDANT WBC PRESENT, PREDOMINANTLY PMN NO ORGANISMS SEEN Performed at [facility], [place] [address] [place], [place] [address]    Culture   Final    NO ANAEROBES ISOLATED; CULTURE IN PROGRESS FOR 5 DAYS   Report Status PENDING  Incomplete  Body fluid culture w Gram Stain     Status: None (Preliminary result)   Collection Time: 01/16/22 11:48 AM   Specimen: Synovium; Body Fluid  Result Value Ref Range Status   Specimen Description SYNOVIAL  Final   Special Requests  RIGHT KNEE  Final   Gram Stain   Final    ABUNDANT WBC PRESENT, PREDOMINANTLY PMN NO ORGANISMS SEEN    Culture   Final    NO GROWTH 3 DAYS Performed at [facility], [address], [place], [place] [address]    Report Status PENDING  Incomplete    RADIOLOGY STUDIES/RESULTS: IR Fluoro Guide CV Line Right  Result Date: 01/18/2022 INDICATION: 66 year old male with worsening end-stage renal failure requiring initiation of hemodialysis. Temporary hemodialysis catheter requested. EXAM: NON-TUNNELED CENTRAL VENOUS HEMODIALYSIS CATHETER PLACEMENT WITH ULTRASOUND AND FLUOROSCOPIC GUIDANCE COMPARISON:  None Available. MEDICATIONS: None FLUOROSCOPY TIME:  One mGy COMPLICATIONS: None immediate. PROCEDURE: Informed written consent was obtained from the patient after a discussion of the risks, benefits, and alternatives to treatment.  Questions regarding the procedure were encouraged and answered. The right neck and chest were prepped with chlorhexidine in a sterile fashion, and a sterile drape was applied covering the operative field. Maximum barrier sterile technique with sterile gowns and gloves were used for the procedure. A timeout was performed prior to the initiation of the procedure. After the overlying soft tissues were anesthetized, a small venotomy incision was created and a micropuncture kit was utilized to access the internal jugular vein. Real-time ultrasound guidance was utilized for vascular access including the acquisition of a permanent ultrasound image documenting patency of the accessed vessel. The microwire was utilized to measure appropriate catheter length. A Rosen wire was advanced to the level of the IVC. Under fluoroscopic guidance, the venotomy was serially dilated, ultimately allowing placement of a 20 cm temporary Trialysis catheter with tip ultimately terminating within the superior aspect of the right atrium. Final catheter positioning was confirmed and documented with a spot radiographic image. The catheter aspirates and flushes normally. The catheter was flushed with appropriate volume heparin dwells. The catheter exit site was secured with a 0 silk retention suture. A dressing was placed. The patient tolerated the procedure well without immediate post procedural complication. IMPRESSION: Successful placement of a right internal jugular approach 20 cm temporary dialysis catheter with tip terminating with in the superior aspect of the right atrium. The catheter is ready for immediate use. PLAN: This catheter may be converted to a tunneled dialysis catheter at a later date as indicated. Ruthann Cancer, MD [facility] Radiology Electronically Signed   By: Ruthann Cancer M.D.   On: 01/18/2022 10:04   US Abdomen Limited RUQ (LIVER/GB)  Result Date: 01/17/2022 CLINICAL  DATA:  Elevated LFTs EXAM: ULTRASOUND ABDOMEN LIMITED RIGHT UPPER QUADRANT COMPARISON:  None Available. FINDINGS: Gallbladder: No gallstones or wall thickening visualized. No sonographic Murphy sign noted by sonographer. Common bile duct: Diameter: 3.7 mm Liver: No focal lesion identified. Within normal limits in parenchymal echogenicity. Portal vein is patent on color Doppler imaging with normal direction of blood flow towards the liver. Other: None. IMPRESSION: Normal right upper quadrant ultrasound. Electronically Signed   By: Ronney Asters M.D.   On: 01/17/2022 19:30     LOS: 4 days   Oren Binet, MD  [facility]    To contact the attending provider between 7A-7P or the covering provider during after hours 7P-7A, please log into the web site [email or web address] and access using universal [place] password for that web site. If you do not have the password, please call the hospital operator.  01/19/2022, 12:07 PM

## 2022-01-19 NOTE — Progress Notes (Signed)
Initial Nutrition Assessment  DOCUMENTATION CODES:   Not applicable  INTERVENTION:   Nepro Shake po BID, each supplement provides 425 kcal and 19 grams protein Renal Multivitamin w/ minerals daily Encourage good PO intake  Diet education  NUTRITION DIAGNOSIS:   Increased nutrient needs related to chronic illness (Cancer, CHF, ESRD) as evidenced by estimated needs.  GOAL:   Patient will meet greater than or equal to 90% of their needs   MONITOR:   PO intake, Supplement acceptance, I & O's, Labs, Weight trends  REASON FOR ASSESSMENT:   Consult Diet education  ASSESSMENT:   66 y.o. male presented to the ED with confusion and hypoglycemia. PMH includes CKD V, HTN, Prostate cancer, T2DM, CHF, and GERD. Pt admitted with sepsis and acute metabolic encephalopathy.   8/17 - HD catheter placed  Pt laying in bed, confused; son at bedside.  Son reports that pt appetite was good and was eating well until HD catheter was placed yesterday. States he has not ate much since then.   Per EMR, pt has had 7% weight loss within 8 months, this is not clinically significant for time frame.   Discussed the use of supplements during times of poor PO intake. Son agreeable to ONS for pt.  RD provided pt with additional diet education handouts for Renal diet on HD. Reviewed with son and pt. RD answered questions that they had.   Medications reviewed and include: Calcitriol, NovoLog SSI, Novolog, Levemir, Protonix, Prednisone, Lokelma Labs reviewed: Sodium 130, Phosphorus 10.1, Hgb A1c 9.6%, 24 hr CBG 238-342  NUTRITION - FOCUSED PHYSICAL EXAM:  Deferred to follow-up.   Diet Order:   Diet Order             Diet Carb Modified Fluid consistency: Thin; Room service appropriate? Yes  Diet effective now                   EDUCATION NEEDS:   Education needs have been addressed  Skin:  Skin Assessment: Reviewed RN Assessment  Last BM:  8/17  Height:   Ht Readings from Last 1  Encounters:  01/15/22 '5\' 11"'$  (1.803 m)    Weight:   Wt Readings from Last 1 Encounters:  01/19/22 124.6 kg    Ideal Body Weight:  78.2 kg  BMI:  Body mass index is 38.31 kg/m.  Estimated Nutritional Needs:   Kcal:  2200-2400  Protein:  110-125 grams  Fluid:  UOP + 1L    Hermina Barters RD, LDN Clinical Dietitian See Shea Evans for contact information.

## 2022-01-19 NOTE — Progress Notes (Signed)
   01/19/22 0400  Assess: MEWS Score  Temp 98 F (36.7 C)  BP 119/88  MAP (mmHg) 100  Pulse Rate (!) 118  ECG Heart Rate (!) 146  Resp (!) 21  SpO2 99 %  O2 Device Nasal Cannula  O2 Flow Rate (L/min) 3 L/min  Assess: MEWS Score  MEWS Temp 0  MEWS Systolic 0  MEWS Pulse 3  MEWS RR 1  MEWS LOC 0  MEWS Score 4  MEWS Score Color Red  Assess: if the MEWS score is Yellow or Red  Were vital signs taken at a resting state? No  Focused Assessment Change from prior assessment (see assessment flowsheet)  Does the patient meet 2 or more of the SIRS criteria? Yes  Does the patient have a confirmed or suspected source of infection? Yes  Provider and Rapid Response Notified? Yes (had been noified at Dialysis)  Treat  MEWS Interventions Other (Comment) (Cleaned and made confortable)  Notify: Charge Nurse/RN  Name of Charge Nurse/RN Notified Janie, RN  Date Charge Nurse/RN Notified 01/19/22  Time Charge Nurse/RN Notified 0400  Notify: Provider  Provider Name/Title Dr. Bridgett Larsson  Date Provider Notified 01/19/22  Time Provider Notified 0400  Method of Notification Page  Notification Reason Change in status  Provider response See new orders  Date of Provider Response 01/19/22  Time of Provider Response 0500  Notify: Rapid Response  Name of Rapid Response RN Notified Mindy, RN  Date Rapid Response Notified 01/19/22  Time Rapid Response Notified 0400  Document  Patient Outcome Not stable and remains on department  Assess: SIRS CRITERIA  SIRS Temperature  0  SIRS Pulse 1  SIRS Respirations  1  SIRS WBC 1  SIRS Score Sum  3

## 2022-01-19 NOTE — Progress Notes (Signed)
Patient's HD site continues to bleed  new thrombi pad placed had been saturated. I have contacted the Dr. Bridgett Larsson for further action.

## 2022-01-19 NOTE — Progress Notes (Signed)
HD cath site bleeding is controlled with pressure dressing. Patient transported to dialysis at 0110am

## 2022-01-20 DIAGNOSIS — I480 Paroxysmal atrial fibrillation: Secondary | ICD-10-CM | POA: Diagnosis not present

## 2022-01-20 DIAGNOSIS — G9341 Metabolic encephalopathy: Secondary | ICD-10-CM | POA: Diagnosis not present

## 2022-01-20 DIAGNOSIS — R4182 Altered mental status, unspecified: Secondary | ICD-10-CM | POA: Diagnosis not present

## 2022-01-20 DIAGNOSIS — A419 Sepsis, unspecified organism: Secondary | ICD-10-CM | POA: Diagnosis not present

## 2022-01-20 DIAGNOSIS — M7989 Other specified soft tissue disorders: Secondary | ICD-10-CM

## 2022-01-20 DIAGNOSIS — R6520 Severe sepsis without septic shock: Secondary | ICD-10-CM | POA: Diagnosis not present

## 2022-01-20 DIAGNOSIS — G9340 Encephalopathy, unspecified: Secondary | ICD-10-CM | POA: Diagnosis not present

## 2022-01-20 LAB — ANAEROBIC CULTURE W GRAM STAIN

## 2022-01-20 LAB — COMPREHENSIVE METABOLIC PANEL
ALT: 95 U/L — ABNORMAL HIGH (ref 0–44)
AST: 149 U/L — ABNORMAL HIGH (ref 15–41)
Albumin: 1.6 g/dL — ABNORMAL LOW (ref 3.5–5.0)
Alkaline Phosphatase: 94 U/L (ref 38–126)
Anion gap: 20 — ABNORMAL HIGH (ref 5–15)
BUN: 177 mg/dL — ABNORMAL HIGH (ref 8–23)
CO2: 20 mmol/L — ABNORMAL LOW (ref 22–32)
Calcium: 8.3 mg/dL — ABNORMAL LOW (ref 8.9–10.3)
Chloride: 93 mmol/L — ABNORMAL LOW (ref 98–111)
Creatinine, Ser: 10.06 mg/dL — ABNORMAL HIGH (ref 0.61–1.24)
GFR, Estimated: 5 mL/min — ABNORMAL LOW (ref >60)
Glucose, Bld: 178 mg/dL — ABNORMAL HIGH (ref 70–99)
Potassium: 4.5 mmol/L (ref 3.5–5.1)
Sodium: 133 mmol/L — ABNORMAL LOW (ref 135–145)
Total Bilirubin: 0.8 mg/dL (ref 0.3–1.2)
Total Protein: 5.8 g/dL — ABNORMAL LOW (ref 6.5–8.1)

## 2022-01-20 LAB — CBC
HCT: 22 % — ABNORMAL LOW (ref 39.0–52.0)
Hemoglobin: 7.2 g/dL — ABNORMAL LOW (ref 13.0–17.0)
MCH: 28.8 pg (ref 26.0–34.0)
MCHC: 32.7 g/dL (ref 30.0–36.0)
MCV: 88 fL (ref 80.0–100.0)
Platelets: 167 10*3/uL (ref 150–400)
RBC: 2.5 MIL/uL — ABNORMAL LOW (ref 4.22–5.81)
RDW: 14.5 % (ref 11.5–15.5)
WBC: 20.4 10*3/uL — ABNORMAL HIGH (ref 4.0–10.5)
nRBC: 0.2 % (ref 0.0–0.2)

## 2022-01-20 LAB — RENAL FUNCTION PANEL
Albumin: 1.6 g/dL — ABNORMAL LOW (ref 3.5–5.0)
Anion gap: 19 — ABNORMAL HIGH (ref 5–15)
BUN: 177 mg/dL — ABNORMAL HIGH (ref 8–23)
CO2: 19 mmol/L — ABNORMAL LOW (ref 22–32)
Calcium: 7.9 mg/dL — ABNORMAL LOW (ref 8.9–10.3)
Chloride: 95 mmol/L — ABNORMAL LOW (ref 98–111)
Creatinine, Ser: 9.55 mg/dL — ABNORMAL HIGH (ref 0.61–1.24)
GFR, Estimated: 5 mL/min — ABNORMAL LOW (ref >60)
Glucose, Bld: 159 mg/dL — ABNORMAL HIGH (ref 70–99)
Phosphorus: 10.2 mg/dL — ABNORMAL HIGH (ref 2.5–4.6)
Potassium: 4.2 mmol/L (ref 3.5–5.1)
Sodium: 133 mmol/L — ABNORMAL LOW (ref 135–145)

## 2022-01-20 LAB — GLUCOSE, CAPILLARY
Glucose-Capillary: 122 mg/dL — ABNORMAL HIGH (ref 70–99)
Glucose-Capillary: 214 mg/dL — ABNORMAL HIGH (ref 70–99)
Glucose-Capillary: 254 mg/dL — ABNORMAL HIGH (ref 70–99)

## 2022-01-20 LAB — CULTURE, BLOOD (ROUTINE X 2)
Culture: NO GROWTH
Culture: NO GROWTH
Special Requests: ADEQUATE
Special Requests: ADEQUATE

## 2022-01-20 MED ORDER — HEPARIN SODIUM (PORCINE) 1000 UNIT/ML IJ SOLN
INTRAMUSCULAR | Status: AC
  Administered 2022-01-20: 1000 [IU]
  Filled 2022-01-20: qty 3

## 2022-01-20 MED ORDER — APIXABAN 5 MG PO TABS
5.0000 mg | ORAL_TABLET | Freq: Two times a day (BID) | ORAL | Status: DC
  Administered 2022-01-20 (×2): 5 mg via ORAL
  Filled 2022-01-20 (×2): qty 1

## 2022-01-20 MED ORDER — PREDNISONE 5 MG PO TABS
30.0000 mg | ORAL_TABLET | Freq: Every day | ORAL | Status: DC
  Administered 2022-01-21: 30 mg via ORAL
  Filled 2022-01-20: qty 1

## 2022-01-20 MED ORDER — PANTOPRAZOLE SODIUM 40 MG PO TBEC
40.0000 mg | DELAYED_RELEASE_TABLET | Freq: Two times a day (BID) | ORAL | Status: DC
  Administered 2022-01-20 – 2022-02-02 (×26): 40 mg via ORAL
  Filled 2022-01-20 (×26): qty 1

## 2022-01-20 MED ORDER — CARVEDILOL 25 MG PO TABS
25.0000 mg | ORAL_TABLET | Freq: Two times a day (BID) | ORAL | Status: DC
  Administered 2022-01-20 – 2022-02-02 (×22): 25 mg via ORAL
  Filled 2022-01-20 (×22): qty 1

## 2022-01-20 MED ORDER — METOPROLOL TARTRATE 5 MG/5ML IV SOLN
5.0000 mg | INTRAVENOUS | Status: DC | PRN
  Administered 2022-01-20 – 2022-01-21 (×2): 5 mg via INTRAVENOUS
  Filled 2022-01-20 (×2): qty 5

## 2022-01-20 MED ORDER — CARVEDILOL 12.5 MG PO TABS
12.5000 mg | ORAL_TABLET | Freq: Once | ORAL | Status: AC
  Administered 2022-01-20: 12.5 mg via ORAL
  Filled 2022-01-20: qty 1

## 2022-01-20 MED ORDER — SALINE SPRAY 0.65 % NA SOLN
1.0000 | NASAL | Status: DC | PRN
  Administered 2022-01-20: 1 via NASAL
  Filled 2022-01-20: qty 44

## 2022-01-20 NOTE — Plan of Care (Signed)

## 2022-01-20 NOTE — Progress Notes (Signed)
ANTICOAGULATION CONSULT NOTE - Follow Up Consult  Pharmacy Consult for Eliquis Indication: atrial fibrillation  No Known Allergies  Patient Measurements: Height: '5\' 11"'$  (180.3 cm) Weight: 124.6 kg (274 lb 11.1 oz) IBW/kg (Calculated) : 75.3   Vital Signs: Temp: 97.8 F (36.6 C) (08/19 1032) Temp Source: Oral (08/19 1032) BP: 124/91 (08/19 1032) Pulse Rate: 67 (08/19 1032)  Labs: Recent Labs    01/18/22 0339 01/19/22 0321 01/20/22 0343 01/20/22 0820  HGB 8.1* 8.0* 7.2*  --   HCT 24.6* 24.7* 22.0*  --   PLT 151 179 167  --   CREATININE 9.54* 7.97* 10.06* 9.55*    Estimated Creatinine Clearance: 9.9 mL/min (A) (by C-G formula based on SCr of 9.55 mg/dL (H)).   Assessment: [name] is a 66 y/o male with new onset Afib. Pharmacy has been consulted to start anticoagulation with Eliquis.    Plan:  Start Eliquis '5mg'$  BID  Patient will receive new medication counseling.   Vicenta Dunning, PharmD  PGY1 Pharmacy Resident

## 2022-01-20 NOTE — Progress Notes (Signed)
Progress Note  Patient Name: [name] Date of Encounter: 01/20/2022  [facility] Cardiologist: [place]   Subjective   66 yo, hx of CKD/ ESRD , DM, chronic diastolic CHF, HTN New onset atrial fib with RVR  Has previously seen Dr. Caryl Comes   Had his 1st HD session.  Was ended prematurely due to rapid AFib  Echo shows EF 55-60%   Was admitted with hypotension.  Beta blockers have been held  Was started back on Coreg 12.5 BID last night   Will increase the coreg to 25 BID ( will give an extra 12.5 mg today )   Inpatient Medications    Scheduled Meds:  apixaban  5 mg Oral BID   calcitRIOL  0.25 mcg Oral Daily   carvedilol  12.5 mg Oral BID WC   Chlorhexidine Gluconate Cloth  6 each Topical Q0600   feeding supplement (NEPRO CARB STEADY)  237 mL Oral BID BM   insulin aspart  0-9 Units Subcutaneous TID WC   insulin aspart  8 Units Subcutaneous TID WC   insulin detemir  30 Units Subcutaneous BID   multivitamin  1 tablet Oral QHS   pantoprazole  40 mg Oral BID   [START ON 01/21/2022] predniSONE  30 mg Oral Q breakfast   sodium chloride flush  10-40 mL Intracatheter Q12H   sodium chloride flush  3 mL Intravenous Q12H   sodium chloride flush  3 mL Intravenous Q12H   sodium zirconium cyclosilicate  10 g Oral Daily   Continuous Infusions:  sodium chloride     sodium chloride     anticoagulant sodium citrate     PRN Meds: sodium chloride, sodium chloride, acetaminophen, hydrALAZINE, HYDROmorphone (DILAUDID) injection, lidocaine (PF), lidocaine-prilocaine, ondansetron (ZOFRAN) IV, pentafluoroprop-tetrafluoroeth, sodium chloride flush, sodium chloride flush, sodium chloride flush   Vital Signs    Vitals:   01/20/22 0930 01/20/22 1014 01/20/22 1032 01/20/22 1130  BP: 129/88 138/79 (!) 124/91 (!) 150/83  Pulse: (!) 39 72 67 79  Resp: 14 15 (!) 21 16  Temp:   97.8 F (36.6 C)   TempSrc:   Oral   SpO2: 100% 100% 100%   Weight:      Height:        Intake/Output  Summary (Last 24 hours) at 01/20/2022 1311 Last data filed at 01/20/2022 1032 Gross per 24 hour  Intake 120 ml  Output 150 ml  Net -30 ml      01/19/2022    4:47 AM 01/18/2022    5:00 AM 01/17/2022    4:20 AM  Last 3 Weights  Weight (lbs) 274 lb 11.1 oz 272 lb 7.8 oz 272 lb 14.9 oz  Weight (kg) 124.6 kg 123.6 kg 123.8 kg      Telemetry    Atrial fib with V rate of 120-140s - Personally Reviewed  ECG     - Personally Reviewed  Physical Exam   GEN: morbidly obese , middle age male,  Neck: No JVD Cardiac: irreg. Irreg.  tachy Respiratory: Clear to auscultation bilaterally. GI: Soft, nontender, non-distended  MS: No edema; left arm is edematous ( s/p dialysis fistula placement recently )  Neuro:  Nonfocal  Psych: Normal affect   Labs    High Sensitivity Troponin:  No results for input(s): "TROPONINIHS" in the last 720 hours.   Chemistry Recent Labs  Lab 01/15/22 1323 01/16/22 0410 01/17/22 0441 01/17/22 1005 01/18/22 1042 01/19/22 0321 01/20/22 0343 01/20/22 0820  NA  --    < >  132*   < >  --  130* 133* 133*  K  --    < > 5.8*   < >  --  3.9 4.5 4.2  CL  --    < > 95*   < >  --  92* 93* 95*  CO2  --    < > 18*   < >  --  19* 20* 19*  GLUCOSE  --    < > 367*   < >  --  255* 178* 159*  BUN  --    < > 145*   < >  --  143* 177* 177*  CREATININE  --    < > 8.49*   < >  --  7.97* 10.06* 9.55*  CALCIUM  --    < > 7.4*   < >  --  7.6* 8.3* 7.9*  MG 1.9  --  2.4  --   --   --   --   --   PROT  --   --  6.4*  --  6.0* 6.1* 5.8*  --   ALBUMIN  --   --  1.7*   < > 1.6* 1.6* 1.6* 1.6*  AST  --   --  510*  --  456* 345* 149*  --   ALT  --   --  123*  --  132* 129* 95*  --   ALKPHOS  --   --  133*  --  125 133* 94  --   BILITOT  --   --  1.3*  --  0.7 0.9 0.8  --   GFRNONAA  --    < > 6*   < >  --  7* 5* 5*  ANIONGAP  --    < > 19*   < >  --  19* 20* 19*   < > = values in this interval not displayed.    Lipids No results for input(s): "CHOL", "TRIG", "HDL", "LABVLDL",  "LDLCALC", "CHOLHDL" in the last 168 hours.  Hematology Recent Labs  Lab 01/18/22 0339 01/19/22 0321 01/20/22 0343  WBC 16.5* 20.1* 20.4*  RBC 2.77* 2.78* 2.50*  HGB 8.1* 8.0* 7.2*  HCT 24.6* 24.7* 22.0*  MCV 88.8 88.8 88.0  MCH 29.2 28.8 28.8  MCHC 32.9 32.4 32.7  RDW 14.3 14.2 14.5  PLT 151 179 167   Thyroid  Recent Labs  Lab 01/15/22 1028  TSH 0.874    BNP Recent Labs  Lab 01/15/22 1028  BNP 497.7*    DDimer No results for input(s): "DDIMER" in the last 168 hours.   Radiology    No results found.  Cardiac Studies      Patient Profile     66 y.o. male  with ESRD, Chronic diastolic CHF, atrial fib  Assessment & Plan      Atrial fib:  HR is still rapid.  Will increase the coreg to 25 po BID .  Will give an additional 12.5 mg today   2.  Left arm edema.   Secondary to his dialysis fistula placement in the left upper arm .   Needs to elevated his arm if possible        For questions or updates, please contact [place] Please consult [email or web address] for contact info under        Signed, Mertie Moores, MD  01/20/2022, 1:11 PM

## 2022-01-20 NOTE — Discharge Instructions (Addendum)
Follow with Primary MD Sonia Side., FNP in 7 days   Get CBC, CMP, 2 view Chest X ray -  checked next visit within 1 week by SNF MD    Activity: As tolerated with Full fall precautions use walker/cane & assistance as needed  Disposition SNF  Diet: Renal - low carbohydrate diet with 1200 cc fluid restriction.  Check CBGs q. ACH S at SNF.  Adjust insulin as needed.  Special Instructions: If you have smoked or chewed Tobacco  in the last 2 yrs please stop smoking, stop any regular Alcohol  and or any Recreational drug use.  On your next visit with your primary care physician please Get Medicines reviewed and adjusted.  Please request your Prim.MD to go over all Hospital Tests and Procedure/Radiological results at the follow up, please get all Hospital records sent to your Prim MD by signing hospital release before you go home.  If you experience worsening of your admission symptoms, develop shortness of breath, life threatening emergency, suicidal or homicidal thoughts you must seek medical attention immediately by calling 911 or calling your MD immediately  if symptoms less severe.  You Must read complete instructions/literature along with all the possible adverse reactions/side effects for all the Medicines you take and that have been prescribed to you. Take any new Medicines after you have completely understood and accpet all the possible adverse reactions/side effects.     Information on my medicine - ELIQUIS (apixaban)  This medication education was reviewed with me or my healthcare representative as part of my discharge preparation.    Why was Eliquis prescribed for you? Eliquis was prescribed for you to reduce the risk of a blood clot forming that can cause a stroke if you have a medical condition called atrial fibrillation (a type of irregular heartbeat).  What do You need to know about Eliquis ? Take your Eliquis TWICE DAILY - one tablet in the morning and one tablet in  the evening with or without food. If you have difficulty swallowing the tablet whole please discuss with your pharmacist how to take the medication safely.  Take Eliquis exactly as prescribed by your doctor and DO NOT stop taking Eliquis without talking to the doctor who prescribed the medication.  Stopping may increase your risk of developing a stroke.  Refill your prescription before you run out.  After discharge, you should have regular check-up appointments with your healthcare provider that is prescribing your Eliquis.  In the future your dose may need to be changed if your kidney function or weight changes by a significant amount or as you get older.  What do you do if you miss a dose? If you miss a dose, take it as soon as you remember on the same day and resume taking twice daily.  Do not take more than one dose of ELIQUIS at the same time to make up a missed dose.  Important Safety Information A possible side effect of Eliquis is bleeding. You should call your healthcare provider right away if you experience any of the following: Bleeding from an injury or your nose that does not stop. Unusual colored urine (red or dark brown) or unusual colored stools (red or black). Unusual bruising for unknown reasons. A serious fall or if you hit your head (even if there is no bleeding).  Some medicines may interact with Eliquis and might increase your risk of bleeding or clotting while on Eliquis. To help avoid this, consult your healthcare  provider or pharmacist prior to using any new prescription or non-prescription medications, including herbals, vitamins, non-steroidal anti-inflammatory drugs (NSAIDs) and supplements.  This website has more information on Eliquis (apixaban): http://www.eliquis.com/eliquis/home

## 2022-01-20 NOTE — Progress Notes (Signed)
[name] Progress Note   Assessment/ Plan:    AKI on CKD V: In the setting of septic arthritis with confusion--> uremia.  I think this is the sentinel event that pushes him over into ESRD.  D/w pt and son- will need to start HD this admission.               - AVF will need to heal before it can be used--> will c/s IR for [facility], appreciate assistance  - nontunneled placed since he's got WBC, etc so will need to be converted when appropriate             - blood cultures are negative              - HD #1 overnight 01/18/22, poorly tolerated  - HD #2 t8/19  - appreciate IR helping with catheter oozing  - next HD Monday   2.  L knee septic arthritis             - s/p arthrocentesis with ortho, appreciate assistance             - s/p vanc/ cefepime/ solumedrol, now off antibiotics and on pred   3.  Hyperkalemia             - Lokelma daily, anticipate can stop after Monday   4.  Hyponatremia:             - volume- related, HD will correct this   5.  Elevated LFTs:             - marked elevation in the last 2 days, now improving             - stop atorvastatin             - RUQ [place] normal  6.  Encephalopathy:  - combination likely of uremia/ sepsis/ ? Liver  - ammonia 24 01/15/22  7.  Afib with RVR:  -s/p dilt gtt--> coreg  - Eliquis   8.  Dispo: inpt  Subjective:    Seen in dialysis both pre- and on machine.  Did better with HD today.  No further bleeding from nontunneled HD cath.  Able to complete full rx   Objective:   BP (!) 124/91 (BP Location: Right Wrist)   Pulse 67   Temp 97.8 F (36.6 C) (Oral)   Resp (!) 21   Ht '5\' 11"'$  (1.803 m)   Wt 124.6 kg   SpO2 100%   BMI 38.31 kg/m   Intake/Output Summary (Last 24 hours) at 01/20/2022 1131 Last data filed at 01/20/2022 1032 Gross per 24 hour  Intake 120 ml  Output 150 ml  Net -30 ml   Weight change:   Physical Exam: GEN NAD, lying in bed HEENT EOMI PERRL NECK NO JVD PULM  clear CV RRR  ABD +  pannus EXT no LE edema NEURO cognitive slowing/ delayed processing, shaky today MSK L knee tenderness ACCESS L AVF + T/B, healing well, R IJ nontunneled HD catheter with no blood  Imaging: No results found.  Labs: BMET Recent Labs  Lab 01/15/22 1323 01/16/22 0410 01/17/22 0441 01/17/22 1005 01/18/22 0339 01/19/22 0321 01/20/22 0343 01/20/22 0820  NA  --  135 132* 129* 129* 130* 133* 133*  K  --  5.3* 5.8* 5.4* 4.8 3.9 4.5 4.2  CL  --  98 95* 93* 93* 92* 93* 95*  CO2  --  21* 18* 16* 17* 19* 20*  19*  GLUCOSE  --  253* 367* 360* 360* 255* 178* 159*  BUN  --  116* 145* 149* 170* 143* 177* 177*  CREATININE  --  6.85* 8.49* 8.64* 9.54* 7.97* 10.06* 9.55*  CALCIUM  --  7.9* 7.4* 7.5* 7.1* 7.6* 8.3* 7.9*  PHOS 4.9*  --  9.7* 9.7* 10.1*  --   --  10.2*   CBC Recent Labs  Lab 01/15/22 1028 01/16/22 0410 01/17/22 0441 01/18/22 0339 01/19/22 0321 01/20/22 0343  WBC 14.5*   < > 15.3* 16.5* 20.1* 20.4*  NEUTROABS 11.4*  --  14.0*  --   --   --   HGB 9.3*   < > 8.1* 8.1* 8.0* 7.2*  HCT 29.4*   < > 25.9* 24.6* 24.7* 22.0*  MCV 91.9   < > 92.8 88.8 88.8 88.0  PLT 192   < > 163 151 179 167   < > = values in this interval not displayed.    Medications:     apixaban  5 mg Oral BID   calcitRIOL  0.25 mcg Oral Daily   carvedilol  12.5 mg Oral BID WC   Chlorhexidine Gluconate Cloth  6 each Topical Q0600   feeding supplement (NEPRO CARB STEADY)  237 mL Oral BID BM   insulin aspart  0-9 Units Subcutaneous TID WC   insulin aspart  8 Units Subcutaneous TID WC   insulin detemir  30 Units Subcutaneous BID   multivitamin  1 tablet Oral QHS   pantoprazole  40 mg Oral BID   [START ON 01/21/2022] predniSONE  30 mg Oral Q breakfast   sodium chloride flush  10-40 mL Intracatheter Q12H   sodium chloride flush  3 mL Intravenous Q12H   sodium chloride flush  3 mL Intravenous Q12H   sodium zirconium cyclosilicate  10 g Oral Daily    Madelon Lips, MD 01/20/2022, 11:31 AM

## 2022-01-20 NOTE — Progress Notes (Signed)
PROGRESS NOTE        PATIENT DETAILS Name: [name] Age: 66 y.o. Sex: male Date of Birth: 1955-11-21 Admit Date: 01/15/2022 Admitting Physician Lequita Halt, MD [facility]:TDSKA, Malva Limes., FNP  Brief Summary: Patient is a 66 y.o.  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  See below for further details.  Significant events: 8/14>> admit for confusion/gouty arthritis/progression to ESRD. 8/16>> worsening transaminases-renal planning HD. 8/17>> TDC placed-first HD planned. 8/18>> first HD cut short-A-fib RVR-Cardizem drip started.  Oozing from nontunneled HD cath site.  Significant studies: 8/15>> MRI brain: No acute CVA. 8/15>> renal ultrasound: No hydronephrosis. 8/14>> left knee synovial fluid: WBC 23,835, extracellular sodium urate crystals. 8/15>> right knee synovial fluid: WBC 15,600, intracellular monosodium urate crystals. 8/16>> Echo: EF 55-60% 8/16>> RUQ ultrasound: No major abnormalities noted.  Significant microbiology data: 8/14>> blood culture: No growth 8/14>> left knee synovial fluid: No growth 8/15>> right knee synovial fluid: No growth  Procedures: 8/14>> left knee arthrocentesis 8/15>> right knee arthrocentesis 8/17>> tunneled HD catheter placement by IR.  Consults: Nephrology. Orthopedics Cardiology  Subjective: Bleeding from HD catheter site has stopped.  Still remains somewhat slow and lethargic but still awake and alert.  No family at bedside.  Converted to sinus rhythm yesterday.  Objective: Vitals: Blood pressure (!) 124/91, pulse 67, temperature 97.8 F (36.6 C), temperature source Oral, resp. rate (!) 21, height '5\' 11"'$  (1.803 m), weight 124.6 kg, SpO2 100 %.   Exam: Gen Exam: Slow to respond-still lethargic but  relatively awake and alert.  Not in distress-chronically sick appearing. HEENT:atraumatic, normocephalic Chest: B/L clear to auscultation anteriorly CVS:S1S2 regular Abdomen:soft non tender, non distended Extremities:no edema Neurology: Generalized weakness-chronic left upper extremity weakness (per son chronic) Skin: no rash   Pertinent Labs/Radiology:    Latest Ref Rng & Units 01/20/2022    3:43 AM 01/19/2022    3:21 AM 01/18/2022    3:39 AM  CBC  WBC 4.0 - 10.5 K/uL 20.4  20.1  16.5   Hemoglobin 13.0 - 17.0 g/dL 7.2  8.0  8.1   Hematocrit 39.0 - 52.0 % 22.0  24.7  24.6   Platelets 150 - 400 K/uL 167  179  151     Lab Results  Component Value Date   NA 133 (L) 01/20/2022   K 4.2 01/20/2022   CL 95 (L) 01/20/2022   CO2 19 (L) 01/20/2022       Assessment/Plan: SIRS due to acute gouty arthritis involving bilateral knees, numerous joints of left hand: Bilateral knee arthralgias/left hand arthralgias better-synovial fluid cultures negative so far-no longer on IV antibiotics-has been transitioned from IV to oral steroids-plans to taper down to his usual regimen.  Suspect some arthralgias may be from uremia as well.  Hopefully with further HD sessions-we can assess for clinical improvement.  Leukocytosis is likely secondary to steroids-and not from infection as all cultures remain negative.    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   Oozing from tunneled HD catheter site: Evaluated by IR on 8/18-bleeding site sutured-no further bleeding evident.  A-fib with  RVR: Triggered by uremia/acute illness-briefly required Cardizem infusion-anticoagulation not started due to bleeding from HD catheter site-since this is now resolved-we will start Eliquis.  Back on low-dose Coreg.  Watch closely.  Appreciate cardiology input.  Hyperkalemia: Due to worsening renal function-better with Lokelma-dosage has  been adjusted-for repeat HD today.  Thankfully potassium is now normalized.  Acute metabolic encephalopathy: Due to uremia-mostly in the form of lethargy.  Continue supportive care.  Hopefully he will continue to improve after more HD sessions.  Normocytic anemia: Due to acute illness/CKD-although FOBT positive-no evidence of overt blood loss.  Defer Aranesp/iron therapy to nephrology service.  Transaminitis: Unclear etiology-suspect probably medication related-no longer on cefepime and statin-Zytiga on hold.  LFTs downtrending-continue to follow.  RUQ ultrasound unremarkable.  Continue to hold Zytiga/statin.  No longer on cefepime.  Hyponatremia: Related to volume-should improve post HD.  Hypoglycemia: Likely related to use of oral hypoglycemic agents in the setting of worsening renal function.  Continue to watch closely.  DM-2 (A1c 9.6 on 8/14) with uncontrolled hyperglycemia due to steroids: CBG stable-continue Levemir 30 units twice daily, 8 units of Premeal NovoLog and SSI.  Suspect as steroids get tapered down further-we will require further adjustment of insulin regimen.   Will need to permanently stop glipizide and metformin on discharge.  Recent Labs    01/19/22 1543 01/19/22 1807 01/19/22 2154  GLUCAP 235* 239* 201*     Chronic left arm weakness: Per patient's son-this has been ongoing for the past several months-son has noted a problem with the patient's grip mostly.  MRI brain negative for acute CVA.  We will need to check a MRI C-spine at some point.  Suspect some of it may be due to acute gouty arthritis involving his small hand joints as well.  Prostate cancer: On prednisone/Zytiga-currently on higher doses of prednisone than usual-Zytiga remains on hold due to elevated liver enzymes.  Morbid Obesity: Estimated body mass index is 38.31 kg/m as calculated from the following:   Height as of this encounter: '5\' 11"'$  (1.803 m).   Weight as of this encounter: 124.6 kg.   Code  status:   Code Status: Full Code   DVT Prophylaxis: Stop SQ heparin today-as Eliquis being started.   Family Communication: None at bedside.   Disposition Plan: Status is: Inpatient Remains inpatient appropriate because: Likely progression to ESRD-nephrology initiating dialysis-not yet stable for discharge.   Planned Discharge Destination:SNF  Diet: Diet Order             Diet Carb Modified Fluid consistency: Thin; Room service appropriate? Yes with Assist  Diet effective now                     Antimicrobial agents: Anti-infectives (From admission, onward)    Start     Dose/Rate Route Frequency Ordered Stop   01/16/22 1000  ceFEPIme (MAXIPIME) 2 g in sodium chloride 0.9 % 100 mL IVPB  Status:  Discontinued        2 g 200 mL/hr over 30 Minutes Intravenous Every 24 hours 01/15/22 1014 01/17/22 1355   01/15/22 1015  ceFEPIme (MAXIPIME) 2 g in sodium chloride 0.9 % 100 mL IVPB        2 g 200 mL/hr over 30 Minutes Intravenous  Once 01/15/22 1005 01/15/22 1127   01/15/22 1015  metroNIDAZOLE (FLAGYL) IVPB 500 mg        500 mg 100 mL/hr over 60 Minutes Intravenous  Once 01/15/22 1005 01/15/22 1213  01/15/22 1015  vancomycin (VANCOCIN) IVPB 1000 mg/200 mL premix  Status:  Discontinued        1,000 mg 200 mL/hr over 60 Minutes Intravenous  Once 01/15/22 1005 01/15/22 1013   01/15/22 1015  vancomycin (VANCOREADY) IVPB 2000 mg/400 mL        2,000 mg 200 mL/hr over 120 Minutes Intravenous  Once 01/15/22 1013 01/15/22 1325   01/15/22 1011  vancomycin variable dose per unstable renal function (pharmacist dosing)  Status:  Discontinued         Does not apply See admin instructions 01/15/22 1011 01/18/22 1526        MEDICATIONS: Scheduled Meds:  aspirin EC  81 mg Oral Daily   calcitRIOL  0.25 mcg Oral Daily   carvedilol  12.5 mg Oral BID WC   Chlorhexidine Gluconate Cloth  6 each Topical Q0600   feeding supplement (NEPRO CARB STEADY)  237 mL Oral BID BM   heparin  5,000  Units Subcutaneous Q12H   insulin aspart  0-9 Units Subcutaneous TID WC   insulin aspart  8 Units Subcutaneous TID WC   insulin detemir  30 Units Subcutaneous BID   multivitamin  1 tablet Oral QHS   pantoprazole  40 mg Oral BID   predniSONE  40 mg Oral Q breakfast   sodium chloride flush  10-40 mL Intracatheter Q12H   sodium chloride flush  3 mL Intravenous Q12H   sodium chloride flush  3 mL Intravenous Q12H   sodium zirconium cyclosilicate  10 g Oral Daily   Continuous Infusions:  sodium chloride     sodium chloride     anticoagulant sodium citrate     PRN Meds:.sodium chloride, sodium chloride, acetaminophen, hydrALAZINE, HYDROmorphone (DILAUDID) injection, lidocaine (PF), lidocaine-prilocaine, ondansetron (ZOFRAN) IV, pentafluoroprop-tetrafluoroeth, sodium chloride flush, sodium chloride flush, sodium chloride flush   I have personally reviewed following labs and imaging studies  LABORATORY DATA: CBC: Recent Labs  Lab 01/15/22 1028 01/16/22 0410 01/16/22 1758 01/17/22 0441 01/18/22 0339 01/19/22 0321 01/20/22 0343  WBC 14.5* 15.1*  --  15.3* 16.5* 20.1* 20.4*  NEUTROABS 11.4*  --   --  14.0*  --   --   --   HGB 9.3* 7.8* 8.3* 8.1* 8.1* 8.0* 7.2*  HCT 29.4* 25.2* 25.9* 25.9* 24.6* 24.7* 22.0*  MCV 91.9 93.3  --  92.8 88.8 88.8 88.0  PLT 192 169  --  163 151 179 167     Basic Metabolic Panel: Recent Labs  Lab 01/15/22 1323 01/16/22 0410 01/17/22 0441 01/17/22 1005 01/18/22 0339 01/19/22 0321 01/20/22 0343 01/20/22 0820  NA  --    < > 132* 129* 129* 130* 133* 133*  K  --    < > 5.8* 5.4* 4.8 3.9 4.5 4.2  CL  --    < > 95* 93* 93* 92* 93* 95*  CO2  --    < > 18* 16* 17* 19* 20* 19*  GLUCOSE  --    < > 367* 360* 360* 255* 178* 159*  BUN  --    < > 145* 149* 170* 143* 177* 177*  CREATININE  --    < > 8.49* 8.64* 9.54* 7.97* 10.06* 9.55*  CALCIUM  --    < > 7.4* 7.5* 7.1* 7.6* 8.3* 7.9*  MG 1.9  --  2.4  --   --   --   --   --   PHOS 4.9*  --  9.7* 9.7* 10.1*   --   --  10.2*   < > = values in this interval not displayed.     GFR: Estimated Creatinine Clearance: 9.9 mL/min (A) (by C-G formula based on SCr of 9.55 mg/dL (H)).  Liver Function Tests: Recent Labs  Lab 01/15/22 0928 01/17/22 0441 01/17/22 1005 01/18/22 0339 01/18/22 1042 01/19/22 0321 01/20/22 0343 01/20/22 0820  AST 80* 510*  --   --  456* 345* 149*  --   ALT 24 123*  --   --  132* 129* 95*  --   ALKPHOS 123 133*  --   --  125 133* 94  --   BILITOT 1.0 1.3*  --   --  0.7 0.9 0.8  --   PROT 6.1* 6.4*  --   --  6.0* 6.1* 5.8*  --   ALBUMIN 1.7* 1.7*   < > 1.5* 1.6* 1.6* 1.6* 1.6*   < > = values in this interval not displayed.    No results for input(s): "LIPASE", "AMYLASE" in the last 168 hours. Recent Labs  Lab 01/15/22 1026  AMMONIA 27     Coagulation Profile: Recent Labs  Lab 01/15/22 1028  INR 1.3*     Cardiac Enzymes: No results for input(s): "CKTOTAL", "CKMB", "CKMBINDEX", "TROPONINI" in the last 168 hours.  BNP (last 3 results) No results for input(s): "PROBNP" in the last 8760 hours.  Lipid Profile: No results for input(s): "CHOL", "HDL", "LDLCALC", "TRIG", "CHOLHDL", "LDLDIRECT" in the last 72 hours.  Thyroid Function Tests: No results for input(s): "TSH", "T4TOTAL", "FREET4", "T3FREE", "THYROIDAB" in the last 72 hours.   Anemia Panel: No results for input(s): "VITAMINB12", "FOLATE", "FERRITIN", "TIBC", "IRON", "RETICCTPCT" in the last 72 hours.   Urine analysis:    Component Value Date/Time   COLORURINE YELLOW 05/04/2008 2129   APPEARANCEUR Cloudy (A) 08/19/2015 1403   LABSPEC 1.012 05/04/2008 2129   PHURINE 5.5 05/04/2008 2129   GLUCOSEU Negative 08/19/2015 1403   GLUCOSEU 500 (A) 05/04/2008 2129   HGBUR SMALL (A) 04/22/2008 0848   BILIRUBINUR Negative 08/19/2015 1403   KETONESUR NEG mg/dL 05/04/2008 2129   PROTEINUR 1+ (A) 08/19/2015 1403   PROTEINUR 30 (A) 05/04/2008 2129   UROBILINOGEN 0.2 05/04/2008 2129   NITRITE  Negative 08/19/2015 1403   NITRITE NEG 05/04/2008 2129   LEUKOCYTESUR 1+ (A) 08/19/2015 1403    Sepsis Labs: Lactic Acid, Venous    Component Value Date/Time   LATICACIDVEN 2.6 (HH) 01/15/2022 1026    MICROBIOLOGY: Recent Results (from the past 240 hour(s))  Blood Culture (routine x 2)     Status: None   Collection Time: 01/15/22 10:04 AM   Specimen: BLOOD RIGHT FOREARM  Result Value Ref Range Status   Specimen Description BLOOD RIGHT FOREARM  Final   Special Requests   Final    BOTTLES DRAWN AEROBIC AND ANAEROBIC Blood Culture adequate volume   Culture   Final    NO GROWTH 5 DAYS Performed at [facility], [place] [address], [place], [place] [address]    Report Status 01/20/2022 FINAL  Final  Resp Panel by RT-PCR (Flu A&B, Covid) Anterior Nasal Swab     Status: None   Collection Time: 01/15/22 10:05 AM   Specimen: Anterior Nasal Swab  Result Value Ref Range Status   SARS Coronavirus 2 by RT PCR NEGATIVE NEGATIVE Final    Comment: (NOTE) SARS-CoV-2 target nucleic acids are NOT DETECTED.  The SARS-CoV-2 RNA is generally detectable in upper respiratory specimens during the acute phase of infection. The lowest  concentration of SARS-CoV-2 viral copies this assay can detect is 138 copies/mL. A negative result does not preclude SARS-Cov-2 infection and should not be used as the sole basis for treatment or other patient management decisions. A negative result may occur with  improper specimen collection/handling, submission of specimen other than nasopharyngeal swab, presence of viral mutation(s) within the areas targeted by this assay, and inadequate number of viral copies(<138 copies/mL). A negative result must be combined with clinical observations, patient history, and epidemiological information. The expected result is Negative.  Fact Sheet for Patients:  EntrepreneurPulse.com.au  Fact Sheet for Healthcare Providers:   IncredibleEmployment.be  This test is no t yet approved or cleared by the [place] FDA and  has been authorized for detection and/or diagnosis of SARS-CoV-2 by FDA under an Emergency Use Authorization (EUA). This EUA will remain  in effect (meaning this test can be used) for the duration of the COVID-19 declaration under Section 564(b)(1) of the Act, 21 U.S.C.section 360bbb-3(b)(1), unless the authorization is terminated  or revoked sooner.       Influenza A by PCR NEGATIVE NEGATIVE Final   Influenza B by PCR NEGATIVE NEGATIVE Final    Comment: (NOTE) The Xpert Xpress SARS-CoV-2/FLU/RSV plus assay is intended as an aid in the diagnosis of influenza from Nasopharyngeal swab specimens and should not be used as a sole basis for treatment. Nasal washings and aspirates are unacceptable for Xpert Xpress SARS-CoV-2/FLU/RSV testing.  Fact Sheet for Patients: EntrepreneurPulse.com.au  Fact Sheet for Healthcare Providers: IncredibleEmployment.be  This test is not yet approved or cleared by the [place] FDA and has been authorized for detection and/or diagnosis of SARS-CoV-2 by FDA under an Emergency Use Authorization (EUA). This EUA will remain in effect (meaning this test can be used) for the duration of the COVID-19 declaration under Section 564(b)(1) of the Act, 21 U.S.C. section 360bbb-3(b)(1), unless the authorization is terminated or revoked.  Performed at [facility], [place] [address] [place], [place] [address]   Blood Culture (routine x 2)     Status: None   Collection Time: 01/15/22 10:09 AM   Specimen: BLOOD  Result Value Ref Range Status   Specimen Description BLOOD SITE NOT SPECIFIED  Final   Special Requests   Final    BOTTLES DRAWN AEROBIC AND ANAEROBIC Blood Culture adequate volume   Culture   Final    NO GROWTH 5 DAYS Performed at [facility], [address], [place], [place] [address]     Report Status 01/20/2022 FINAL  Final  MRSA Next Gen by PCR, Nasal     Status: None   Collection Time: 01/15/22  1:28 PM   Specimen: Nasal Mucosa; Nasal Swab  Result Value Ref Range Status   MRSA by PCR Next Gen NOT DETECTED NOT DETECTED Final    Comment: (NOTE) The GeneXpert MRSA Assay (FDA approved for NASAL specimens only), is one component of a comprehensive MRSA colonization surveillance program. It is not intended to diagnose MRSA infection nor to guide or monitor treatment for MRSA infections. Test performance is not FDA approved in patients less than 74 years old. Performed at [facility], [place] [address], [place], [place] [address]   Body fluid culture w Gram Stain     Status: None   Collection Time: 01/15/22  5:59 PM   Specimen: Synovial Fluid  Result Value Ref Range Status   Specimen Description FLUID SYNOVIAL KNEE LEFT  Final   Special Requests NONE  Final   Gram Stain   Final    ABUNDANT WBC PRESENT, PREDOMINANTLY PMN NO ORGANISMS SEEN    Culture   Final    NO GROWTH 3 DAYS Performed at [facility] [address] [place], [place] [address]    Report Status 01/18/2022 FINAL  Final  Anaerobic culture w Gram Stain     Status: None (Preliminary result)   Collection Time: 01/15/22  5:59 PM   Specimen: Synovial Fluid  Result Value Ref Range Status   Specimen Description FLUID SYNOVIAL KNEE LEFT  Final   Special Requests NONE  Final   Gram Stain   Final    ABUNDANT WBC PRESENT, PREDOMINANTLY PMN NO ORGANISMS SEEN Performed at [facility], [place] [address], [place], [place] [address]    Culture   Final    NO ANAEROBES ISOLATED; CULTURE IN PROGRESS FOR 5 DAYS   Report Status PENDING  Incomplete  Body fluid culture w Gram Stain     Status: None   Collection Time: 01/16/22 11:48 AM   Specimen: Synovium; Body Fluid  Result Value Ref Range Status   Specimen Description SYNOVIAL  Final   Special Requests  RIGHT KNEE  Final   Gram Stain   Final     ABUNDANT WBC PRESENT, PREDOMINANTLY PMN NO ORGANISMS SEEN    Culture   Final    NO GROWTH 3 DAYS Performed at [facility], [address] [place], [place] [address]    Report Status 01/19/2022 FINAL  Final    RADIOLOGY STUDIES/RESULTS: No results found.   LOS: 5 days   Oren Binet, MD  [facility]    To contact the attending provider between 7A-7P or the covering provider during after hours 7P-7A, please log into the web site [email or web address] and access using universal [place] password for that web site. If you do not have the password, please call the hospital operator.  01/20/2022, 10:55 AM

## 2022-01-21 DIAGNOSIS — N185 Chronic kidney disease, stage 5: Secondary | ICD-10-CM

## 2022-01-21 DIAGNOSIS — G9341 Metabolic encephalopathy: Secondary | ICD-10-CM | POA: Diagnosis not present

## 2022-01-21 DIAGNOSIS — G9340 Encephalopathy, unspecified: Secondary | ICD-10-CM | POA: Diagnosis not present

## 2022-01-21 DIAGNOSIS — M109 Gout, unspecified: Secondary | ICD-10-CM | POA: Diagnosis not present

## 2022-01-21 DIAGNOSIS — R4182 Altered mental status, unspecified: Secondary | ICD-10-CM | POA: Diagnosis not present

## 2022-01-21 DIAGNOSIS — I480 Paroxysmal atrial fibrillation: Secondary | ICD-10-CM | POA: Diagnosis not present

## 2022-01-21 DIAGNOSIS — A419 Sepsis, unspecified organism: Secondary | ICD-10-CM | POA: Diagnosis not present

## 2022-01-21 LAB — CBC
HCT: 20.4 % — ABNORMAL LOW (ref 39.0–52.0)
HCT: 27.9 % — ABNORMAL LOW (ref 39.0–52.0)
Hemoglobin: 6.7 g/dL — CL (ref 13.0–17.0)
Hemoglobin: 9.5 g/dL — ABNORMAL LOW (ref 13.0–17.0)
MCH: 29 pg (ref 26.0–34.0)
MCH: 28.4 pg (ref 26.0–34.0)
MCHC: 32.8 g/dL (ref 30.0–36.0)
MCHC: 34.1 g/dL (ref 30.0–36.0)
MCV: 88.3 fL (ref 80.0–100.0)
MCV: 83.5 fL (ref 80.0–100.0)
Platelets: 155 10*3/uL (ref 150–400)
Platelets: 168 10*3/uL (ref 150–400)
RBC: 2.31 MIL/uL — ABNORMAL LOW (ref 4.22–5.81)
RBC: 3.34 MIL/uL — ABNORMAL LOW (ref 4.22–5.81)
RDW: 14.5 % (ref 11.5–15.5)
RDW: 15.9 % — ABNORMAL HIGH (ref 11.5–15.5)
WBC: 20.3 10*3/uL — ABNORMAL HIGH (ref 4.0–10.5)
WBC: 25.4 10*3/uL — ABNORMAL HIGH (ref 4.0–10.5)
nRBC: 0.5 % — ABNORMAL HIGH (ref 0.0–0.2)
nRBC: 0.6 % — ABNORMAL HIGH (ref 0.0–0.2)

## 2022-01-21 LAB — PREPARE RBC (CROSSMATCH)

## 2022-01-21 LAB — GLUCOSE, CAPILLARY
Glucose-Capillary: 169 mg/dL — ABNORMAL HIGH (ref 70–99)
Glucose-Capillary: 106 mg/dL — ABNORMAL HIGH (ref 70–99)
Glucose-Capillary: 185 mg/dL — ABNORMAL HIGH (ref 70–99)
Glucose-Capillary: 195 mg/dL — ABNORMAL HIGH (ref 70–99)

## 2022-01-21 LAB — COMPREHENSIVE METABOLIC PANEL
ALT: 72 U/L — ABNORMAL HIGH (ref 0–44)
AST: 75 U/L — ABNORMAL HIGH (ref 15–41)
Albumin: 1.6 g/dL — ABNORMAL LOW (ref 3.5–5.0)
Alkaline Phosphatase: 83 U/L (ref 38–126)
Anion gap: 16 — ABNORMAL HIGH (ref 5–15)
BUN: 140 mg/dL — ABNORMAL HIGH (ref 8–23)
CO2: 22 mmol/L (ref 22–32)
Calcium: 7.8 mg/dL — ABNORMAL LOW (ref 8.9–10.3)
Chloride: 92 mmol/L — ABNORMAL LOW (ref 98–111)
Creatinine, Ser: 8 mg/dL — ABNORMAL HIGH (ref 0.61–1.24)
GFR, Estimated: 7 mL/min — ABNORMAL LOW (ref >60)
Glucose, Bld: 233 mg/dL — ABNORMAL HIGH (ref 70–99)
Potassium: 3.9 mmol/L (ref 3.5–5.1)
Sodium: 130 mmol/L — ABNORMAL LOW (ref 135–145)
Total Bilirubin: 1 mg/dL (ref 0.3–1.2)
Total Protein: 5.5 g/dL — ABNORMAL LOW (ref 6.5–8.1)

## 2022-01-21 LAB — OCCULT BLOOD X 1 CARD TO LAB, STOOL: Fecal Occult Bld: POSITIVE — AB

## 2022-01-21 MED ORDER — ACETAMINOPHEN 325 MG PO TABS: 650.0000 mg | ORAL_TABLET | Freq: Once | ORAL | Status: DC

## 2022-01-21 MED ORDER — HEPARIN SODIUM (PORCINE) 1000 UNIT/ML IJ SOLN
INTRAMUSCULAR | Status: AC
  Administered 2022-01-21: 1000 [IU]
  Filled 2022-01-21: qty 4

## 2022-01-21 MED ORDER — PREDNISONE 20 MG PO TABS
20.0000 mg | ORAL_TABLET | Freq: Every day | ORAL | Status: DC
  Administered 2022-01-22: 20 mg via ORAL
  Filled 2022-01-21: qty 1

## 2022-01-21 MED ORDER — SODIUM CHLORIDE 0.9% IV SOLUTION: Freq: Once | INTRAVENOUS | Status: DC

## 2022-01-21 MED ORDER — DIPHENHYDRAMINE HCL 50 MG/ML IJ SOLN: 25.0000 mg | Freq: Once | INTRAMUSCULAR | Status: DC

## 2022-01-21 NOTE — Procedures (Signed)
Patient seen and examined on Hemodialysis. BP 131/74   Pulse 90   Temp 98.8 F (37.1 C)   Resp 18   Ht '5\' 11"'$  (1.803 m)   Wt 124.6 kg   SpO2 99%   BMI 38.31 kg/m   QB 250 mL/ min with frequent arterial pressure alarms, UF goal 1L  Appears uncomfortable and yelling out occasionally   Madelon Lips MD [place] 10:39 AM

## 2022-01-21 NOTE — Progress Notes (Addendum)
[facility]  Subjective  - Moans in pain and does not communicate verbally   Objective (!) 147/65 81 98.8 F (37.1 C) (Axillary) 18 99%  Intake/Output Summary (Last 24 hours) at 01/21/2022 0937 Last data filed at 01/21/2022 0400 Gross per 24 hour  Intake 30 ml  Output 0 ml  Net 30 ml    Left UE incision healing well Compartment soft, palpable thrill and radial pulse intact.    No active motor to command in the left hand.  Good skin lines without edema.  He does have a small blister on the dorsum of the left thumb.    Assessment/Planning: 8/823 superficialization of the left UE BC AV fistula  Palpable pulse, palpable thrill, and pulse ox reading well on index finger left hand.  No indication of steal, edema or perfusion issues with a working fistula.   He is currently being worked up for acute gout.  Significant events: 8/14>> admit for confusion/gouty arthritis/progression to ESRD. 8/16>> worsening transaminases-renal planning HD. 8/17>> TDC placed-first HD planned. 8/18>> first HD cut short-A-fib RVR-Cardizem drip started.  Oozing from nontunneled HD cath site.    Roxy Horseman 01/21/2022 9:37 AM --  Laboratory Lab Results: Recent Labs    01/20/22 0343 01/21/22 0258  WBC 20.4* 20.3*  HGB 7.2* 6.7*  HCT 22.0* 20.4*  PLT 167 155   BMET Recent Labs    01/20/22 0820 01/21/22 0258  NA 133* 130*  K 4.2 3.9  CL 95* 92*  CO2 19* 22  GLUCOSE 159* 233*  BUN 177* 140*  CREATININE 9.55* 8.00*  CALCIUM 7.9* 7.8*    COAG Lab Results  Component Value Date   INR 1.3 (H) 01/15/2022   INR 1.0 04/19/2008   INR 0.9 RATIO 07/25/2006   No results found for: "PTT"  VASCULAR STAFF ADDENDUM: I have independently interviewed and examined the patient. I agree with the above.   42YM known to our service after creation of left BC AVF (06/06/21) and superficialization of fistula (01/09/22) in anticipation of dialysis needs.  He has progressed from CKD IV-V to ESRD.   He is encephalopathic and not able to provide any history to me today.  Family members at the bedside and reports he was complaining of left hand pain prior to become encephalopathic.  There is a small blister on his hand.  He yells out randomly and pain, but does seem to be uncomfortable when I touch his left hand.  On exam he has a great thrill in his fistula.  The incision is clean, dry, and intact.  He has mild edema in his left upper extremity consistent with recent surgery.  He has an easily palpable 2+ radial pulse.  He does have a small blister on his left thumb which does not appear ischemic to me.  I do not think he has access related hand ischemia at this time.  He does have some expected postoperative edema in his left upper extremity.  For this I would recommend elevation and compression.  Yevonne Aline. Stanford Breed, MD [facility] Phone Number: [phone] 01/21/2022 2:11 PM

## 2022-01-21 NOTE — Progress Notes (Signed)
  X-cover Note: RN reporting BM mixed with blood. Sending hemoccult. HgB down to 6.7 g/dl. Type and screen sent.   Kristopher Oppenheim, DO [facility]

## 2022-01-21 NOTE — Progress Notes (Addendum)
PROGRESS NOTE        PATIENT DETAILS Name: [name] Age: 66 y.o. Sex: male Date of Birth: 10/05/1955 Admit Date: 01/15/2022 Admitting Physician Lequita Halt, MD HUT:MLYYT, Malva Limes., FNP  Brief Summary: Patient is a 66 y.o.  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  See below for further details.  Significant events: 8/14>> admit for confusion/gouty arthritis/progression to ESRD. 8/16>> worsening transaminases-renal planning HD. 8/17>> TDC placed-first HD planned. 8/18>> first HD cut short-A-fib RVR-Cardizem drip started.  Oozing from nontunneled HD cath site.  Converted to sinus rhythm. 8/19>> brief run of RVR.  Tolerated HD without any incident. 8/20>> more encephalopathic-getting second HD-1 episode of small-volume hematochezia overnight.  Significant studies: 8/15>> MRI brain: No acute CVA. 8/15>> renal ultrasound: No hydronephrosis. 8/14>> left knee synovial fluid: WBC 23,835, extracellular sodium urate crystals. 8/15>> right knee synovial fluid: WBC 15,600, intracellular monosodium urate crystals. 8/16>> Echo: EF 55-60% 8/16>> RUQ ultrasound: No major abnormalities noted.  Significant microbiology data: 8/14>> blood culture: No growth 8/14>> left knee synovial fluid: No growth 8/15>> right knee synovial fluid: No growth  Procedures: 8/14>> left knee arthrocentesis 8/15>> right knee arthrocentesis 8/17>> tunneled HD catheter placement by IR.  Consults: Nephrology. Orthopedics Cardiology Vascular ID  Subjective: Confused-having occasional myoclonic jerks.  1 episode of stool with some minimal hematochezia overnight.  Yells out in pain whenever I touch him in any part of his body.   Objective: Vitals: Blood pressure 131/74, pulse 90,  temperature 98.8 F (37.1 C), resp. rate 18, height '5\' 11"'$  (1.803 m), weight 124.6 kg, SpO2 99 %.   Exam: Gen Exam: Confused HEENT:atraumatic, normocephalic Chest: B/L clear to auscultation anteriorly CVS:S1S2 regular Abdomen:soft non tender, non distended Extremities:no edema.  Left upper extremity is somewhat swollen-incision site appears benign.   Neurology: Moves all 4 extremities. Skin: no rash   Pertinent Labs/Radiology:    Latest Ref Rng & Units 01/21/2022    2:58 AM 01/20/2022    3:43 AM 01/19/2022    3:21 AM  CBC  WBC 4.0 - 10.5 K/uL 20.3  20.4  20.1   Hemoglobin 13.0 - 17.0 g/dL 6.7  7.2  8.0   Hematocrit 39.0 - 52.0 % 20.4  22.0  24.7   Platelets 150 - 400 K/uL 155  167  179     Lab Results  Component Value Date   NA 130 (L) 01/21/2022   K 3.9 01/21/2022   CL 92 (L) 01/21/2022   CO2 22 01/21/2022       Assessment/Plan: SIRS due to acute gouty arthritis involving bilateral knees, numerous joints of left hand: Continue tapering steroids-all cultures negative so far-leukocytosis persists but this is either from steroids or from [place] itself.  Today he is confused and yells out in pain wherever I touch him including his calf/bilateral knees and bilateral hands.  Appreciate ID input-continue to monitor off antibiotics-and continue to taper down steroids to his usual dosing of prednisone.  CKD stage V with progression to ESRD: Tolerated HD well yesterday-since encephalopathic-with myoclonic jerks-talked with nephrology-undergoing second dialysis as we speak.  Oozing from tunneled HD catheter site: Evaluated by IR on 8/18-bleeding site sutured-no further  bleeding evident.  A-fib with RVR: Triggered by uremia/acute illness-briefly required Cardizem infusion-maintaining sinus rhythm today.Had a brief run of RVR yesterday.  Continue beta-blocker-but due to drop in hemoglobin-and 1 episode of minimal hematochezia overnight-we will stop Eliquis and watch closely.  Cardiology  following.  1 episode of hematochezia-lower GI bleeding: Hold Eliquis-Per nursing staff-this was minimal.  Watch closely-and if this reoccurs-we will consult GI.  Currently very encephalopathic-and uremic-and likely will not tolerate any GI work-up at this point.  Hyperkalemia: Due to worsening renal function-better with Lokelma-dosage has been adjusted-for repeat HD today.  Thankfully potassium is now normalized.  Acute metabolic encephalopathy: Encephalopathy worse today-likely due to uremia-for repeat HD today.  Follow-hopefully will continue to improve with further HD sessions.  Normocytic anemia: Due to acute illness/CKD-had 1 small episode of hematochezia overnight-suspect anemia is mostly due to acute illness/CKD.  Transfusing PRBC today-watch closely.  Follow-up posttransfusion CBC.  Transaminitis: Unclear etiology-suspect probably medication related-no longer on cefepime and statin-Zytiga on hold.  LFTs downtrending-continue to follow.  RUQ ultrasound unremarkable.  We will continue to hold statin for a few more days-should be able to resume Zytiga.  No longer on cefepime.  Hyponatremia: Related to volume-should improve post HD.  Hypoglycemia: Likely related to use of oral hypoglycemic agents in the setting of worsening renal function.  Continue to watch closely.  DM-2 (A1c 9.6 on 8/14) with uncontrolled hyperglycemia due to steroids: CBGs improved-continue Levemir 30 units twice daily, 8 units of NovoLog with meals-and SSI.   Suspect as steroids get tapered down further-we will require further adjustment of insulin regimen.   Will need to permanently stop glipizide and metformin on discharge.  Recent Labs    01/20/22 1648 01/20/22 2109 01/21/22 0802  GLUCAP 214* 254* 169*     Chronic left arm weakness: Per patient's son-this has been ongoing for the past several months-son has noted a problem with the patient's grip mostly.  MRI brain negative for acute CVA.  We will need to check  a MRI C-spine at some point.  Suspect some of it may be due to acute gouty arthritis involving his small hand joints as well.  Superficialization of left upper extremity AV fistula by vascular surgery on 8/8: With some residual arm swelling that seems to have worsened over the past few days-appreciate vascular surgery evaluation.  Prostate cancer: On prednisone/Zytiga-currently on higher doses of prednisone than usual-Zytiga held due to elevated LFTs but since they have improved-discussed with pharmacy team-Zytiga to be resumed.    Morbid Obesity: Estimated body mass index is 38.31 kg/m as calculated from the following:   Height as of this encounter: '5\' 11"'$  (1.803 m).   Weight as of this encounter: 124.6 kg.   Code status:   Code Status: Full Code   DVT Prophylaxis: Hold Eliquis due to 1 episode of hematochezia.   Family Communication: None at bedside.   Disposition Plan: Status is: Inpatient Remains inpatient appropriate because: Likely progression to ESRD-nephrology initiating dialysis-not yet stable for discharge.   Planned Discharge Destination:SNF  Diet: Diet Order             Diet Carb Modified Fluid consistency: Thin; Room service appropriate? Yes with Assist  Diet effective now                     Antimicrobial agents: Anti-infectives (From admission, onward)    Start     Dose/Rate Route Frequency Ordered Stop   01/16/22 1000  ceFEPIme (MAXIPIME) 2 g  in sodium chloride 0.9 % 100 mL IVPB  Status:  Discontinued        2 g 200 mL/hr over 30 Minutes Intravenous Every 24 hours 01/15/22 1014 01/17/22 1355   01/15/22 1015  ceFEPIme (MAXIPIME) 2 g in sodium chloride 0.9 % 100 mL IVPB        2 g 200 mL/hr over 30 Minutes Intravenous  Once 01/15/22 1005 01/15/22 1127   01/15/22 1015  metroNIDAZOLE (FLAGYL) IVPB 500 mg        500 mg 100 mL/hr over 60 Minutes Intravenous  Once 01/15/22 1005 01/15/22 1213   01/15/22 1015  vancomycin (VANCOCIN) IVPB 1000 mg/200 mL  premix  Status:  Discontinued        1,000 mg 200 mL/hr over 60 Minutes Intravenous  Once 01/15/22 1005 01/15/22 1013   01/15/22 1015  vancomycin (VANCOREADY) IVPB 2000 mg/400 mL        2,000 mg 200 mL/hr over 120 Minutes Intravenous  Once 01/15/22 1013 01/15/22 1325   01/15/22 1011  vancomycin variable dose per unstable renal function (pharmacist dosing)  Status:  Discontinued         Does not apply See admin instructions 01/15/22 1011 01/18/22 1526        MEDICATIONS: Scheduled Meds:  sodium chloride   Intravenous Once   acetaminophen  650 mg Oral Once   calcitRIOL  0.25 mcg Oral Daily   carvedilol  25 mg Oral BID WC   Chlorhexidine Gluconate Cloth  6 each Topical Q0600   diphenhydrAMINE  25 mg Intravenous Once   feeding supplement (NEPRO CARB STEADY)  237 mL Oral BID BM   heparin sodium (porcine)       insulin aspart  0-9 Units Subcutaneous TID WC   insulin aspart  8 Units Subcutaneous TID WC   insulin detemir  30 Units Subcutaneous BID   multivitamin  1 tablet Oral QHS   pantoprazole  40 mg Oral BID   predniSONE  30 mg Oral Q breakfast   sodium chloride flush  10-40 mL Intracatheter Q12H   sodium chloride flush  3 mL Intravenous Q12H   sodium chloride flush  3 mL Intravenous Q12H   Continuous Infusions:  sodium chloride     sodium chloride     anticoagulant sodium citrate     PRN Meds:.sodium chloride, sodium chloride, acetaminophen, heparin sodium (porcine), hydrALAZINE, HYDROmorphone (DILAUDID) injection, lidocaine (PF), lidocaine-prilocaine, metoprolol tartrate, ondansetron (ZOFRAN) IV, pentafluoroprop-tetrafluoroeth, sodium chloride, sodium chloride flush, sodium chloride flush, sodium chloride flush   I have personally reviewed following labs and imaging studies  LABORATORY DATA: CBC: Recent Labs  Lab 01/15/22 1028 01/16/22 0410 01/17/22 0441 01/18/22 0339 01/19/22 0321 01/20/22 0343 01/21/22 0258  WBC 14.5*   < > 15.3* 16.5* 20.1* 20.4* 20.3*   NEUTROABS 11.4*  --  14.0*  --   --   --   --   HGB 9.3*   < > 8.1* 8.1* 8.0* 7.2* 6.7*  HCT 29.4*   < > 25.9* 24.6* 24.7* 22.0* 20.4*  MCV 91.9   < > 92.8 88.8 88.8 88.0 88.3  PLT 192   < > 163 151 179 167 155   < > = values in this interval not displayed.     Basic Metabolic Panel: Recent Labs  Lab 01/15/22 1323 01/16/22 0410 01/17/22 0441 01/17/22 1005 01/18/22 0339 01/19/22 0321 01/20/22 0343 01/20/22 0820 01/21/22 0258  NA  --    < > 132* 129* 129* 130* 133* 133*  130*  K  --    < > 5.8* 5.4* 4.8 3.9 4.5 4.2 3.9  CL  --    < > 95* 93* 93* 92* 93* 95* 92*  CO2  --    < > 18* 16* 17* 19* 20* 19* 22  GLUCOSE  --    < > 367* 360* 360* 255* 178* 159* 233*  BUN  --    < > 145* 149* 170* 143* 177* 177* 140*  CREATININE  --    < > 8.49* 8.64* 9.54* 7.97* 10.06* 9.55* 8.00*  CALCIUM  --    < > 7.4* 7.5* 7.1* 7.6* 8.3* 7.9* 7.8*  MG 1.9  --  2.4  --   --   --   --   --   --   PHOS 4.9*  --  9.7* 9.7* 10.1*  --   --  10.2*  --    < > = values in this interval not displayed.     GFR: Estimated Creatinine Clearance: 11.9 mL/min (A) (by C-G formula based on SCr of 8 mg/dL (H)).  Liver Function Tests: Recent Labs  Lab 01/17/22 0441 01/17/22 1005 01/18/22 1042 01/19/22 0321 01/20/22 0343 01/20/22 0820 01/21/22 0258  AST 510*  --  456* 345* 149*  --  75*  ALT 123*  --  132* 129* 95*  --  72*  ALKPHOS 133*  --  125 133* 94  --  83  BILITOT 1.3*  --  0.7 0.9 0.8  --  1.0  PROT 6.4*  --  6.0* 6.1* 5.8*  --  5.5*  ALBUMIN 1.7*   < > 1.6* 1.6* 1.6* 1.6* 1.6*   < > = values in this interval not displayed.    No results for input(s): "LIPASE", "AMYLASE" in the last 168 hours. Recent Labs  Lab 01/15/22 1026  AMMONIA 27     Coagulation Profile: Recent Labs  Lab 01/15/22 1028  INR 1.3*     Cardiac Enzymes: No results for input(s): "CKTOTAL", "CKMB", "CKMBINDEX", "TROPONINI" in the last 168 hours.  BNP (last 3 results) No results for input(s): "PROBNP" in the  last 8760 hours.  Lipid Profile: No results for input(s): "CHOL", "HDL", "LDLCALC", "TRIG", "CHOLHDL", "LDLDIRECT" in the last 72 hours.  Thyroid Function Tests: No results for input(s): "TSH", "T4TOTAL", "FREET4", "T3FREE", "THYROIDAB" in the last 72 hours.   Anemia Panel: No results for input(s): "VITAMINB12", "FOLATE", "FERRITIN", "TIBC", "IRON", "RETICCTPCT" in the last 72 hours.   Urine analysis:    Component Value Date/Time   COLORURINE YELLOW 05/04/2008 2129   APPEARANCEUR Cloudy (A) 08/19/2015 1403   LABSPEC 1.012 05/04/2008 2129   PHURINE 5.5 05/04/2008 2129   GLUCOSEU Negative 08/19/2015 1403   GLUCOSEU 500 (A) 05/04/2008 2129   HGBUR SMALL (A) 04/22/2008 0848   BILIRUBINUR Negative 08/19/2015 1403   KETONESUR NEG mg/dL 05/04/2008 2129   PROTEINUR 1+ (A) 08/19/2015 1403   PROTEINUR 30 (A) 05/04/2008 2129   UROBILINOGEN 0.2 05/04/2008 2129   NITRITE Negative 08/19/2015 1403   NITRITE NEG 05/04/2008 2129   LEUKOCYTESUR 1+ (A) 08/19/2015 1403    Sepsis Labs: Lactic Acid, Venous    Component Value Date/Time   LATICACIDVEN 2.6 ([place]) 01/15/2022 1026    MICROBIOLOGY: Recent Results (from the past 240 hour(s))  Blood Culture (routine x 2)     Status: None   Collection Time: 01/15/22 10:04 AM   Specimen: BLOOD RIGHT FOREARM  Result Value Ref Range Status   Specimen  Description BLOOD RIGHT FOREARM  Final   Special Requests   Final    BOTTLES DRAWN AEROBIC AND ANAEROBIC Blood Culture adequate volume   Culture   Final    NO GROWTH 5 DAYS Performed at [facility], [address], [place], [place] [address]    Report Status 01/20/2022 FINAL  Final  Resp Panel by RT-PCR (Flu A&B, Covid) Anterior Nasal Swab     Status: None   Collection Time: 01/15/22 10:05 AM   Specimen: Anterior Nasal Swab  Result Value Ref Range Status   SARS Coronavirus 2 by RT PCR NEGATIVE NEGATIVE Final    Comment: (NOTE) SARS-CoV-2 target nucleic acids are NOT DETECTED.  The  SARS-CoV-2 RNA is generally detectable in upper respiratory specimens during the acute phase of infection. The lowest concentration of SARS-CoV-2 viral copies this assay can detect is 138 copies/mL. A negative result does not preclude SARS-Cov-2 infection and should not be used as the sole basis for treatment or other patient management decisions. A negative result may occur with  improper specimen collection/handling, submission of specimen other than nasopharyngeal swab, presence of viral mutation(s) within the areas targeted by this assay, and inadequate number of viral copies(<138 copies/mL). A negative result must be combined with clinical observations, patient history, and epidemiological information. The expected result is Negative.  Fact Sheet for Patients:  EntrepreneurPulse.com.au  Fact Sheet for Healthcare Providers:  IncredibleEmployment.be  This test is no t yet approved or cleared by the [place] FDA and  has been authorized for detection and/or diagnosis of SARS-CoV-2 by FDA under an Emergency Use Authorization (EUA). This EUA will remain  in effect (meaning this test can be used) for the duration of the COVID-19 declaration under Section 564(b)(1) of the Act, 21 U.S.C.section 360bbb-3(b)(1), unless the authorization is terminated  or revoked sooner.       Influenza A by PCR NEGATIVE NEGATIVE Final   Influenza B by PCR NEGATIVE NEGATIVE Final    Comment: (NOTE) The Xpert Xpress SARS-CoV-2/FLU/RSV plus assay is intended as an aid in the diagnosis of influenza from Nasopharyngeal swab specimens and should not be used as a sole basis for treatment. Nasal washings and aspirates are unacceptable for Xpert Xpress SARS-CoV-2/FLU/RSV testing.  Fact Sheet for Patients: EntrepreneurPulse.com.au  Fact Sheet for Healthcare Providers: IncredibleEmployment.be  This test is not yet approved or  cleared by the [place] FDA and has been authorized for detection and/or diagnosis of SARS-CoV-2 by FDA under an Emergency Use Authorization (EUA). This EUA will remain in effect (meaning this test can be used) for the duration of the COVID-19 declaration under Section 564(b)(1) of the Act, 21 U.S.C. section 360bbb-3(b)(1), unless the authorization is terminated or revoked.  Performed at [facility], [place] [address] [place], [place] [address]   Blood Culture (routine x 2)     Status: None   Collection Time: 01/15/22 10:09 AM   Specimen: BLOOD  Result Value Ref Range Status   Specimen Description BLOOD SITE NOT SPECIFIED  Final   Special Requests   Final    BOTTLES DRAWN AEROBIC AND ANAEROBIC Blood Culture adequate volume   Culture   Final    NO GROWTH 5 DAYS Performed at [facility], [address] [place], [place] [address]    Report Status 01/20/2022 FINAL  Final  MRSA Next Gen by PCR, Nasal     Status: None   Collection Time: 01/15/22  1:28 PM   Specimen: Nasal  Mucosa; Nasal Swab  Result Value Ref Range Status   MRSA by PCR Next Gen NOT DETECTED NOT DETECTED Final    Comment: (NOTE) The GeneXpert MRSA Assay (FDA approved for NASAL specimens only), is one component of a comprehensive MRSA colonization surveillance program. It is not intended to diagnose MRSA infection nor to guide or monitor treatment for MRSA infections. Test performance is not FDA approved in patients less than 27 years old. Performed at [facility], [place] [address], [place], [place] [address]   Body fluid culture w Gram Stain     Status: None   Collection Time: 01/15/22  5:59 PM   Specimen: Synovial Fluid  Result Value Ref Range Status   Specimen Description FLUID SYNOVIAL KNEE LEFT  Final   Special Requests NONE  Final   Gram Stain   Final    ABUNDANT WBC PRESENT, PREDOMINANTLY PMN NO ORGANISMS SEEN    Culture   Final    NO GROWTH 3 DAYS Performed at [facility], [place] [address], [place], [place] [address]    Report Status 01/18/2022 FINAL  Final  Anaerobic culture w Gram Stain     Status: None   Collection Time: 01/15/22  5:59 PM   Specimen: Synovial Fluid  Result Value Ref Range Status   Specimen Description FLUID SYNOVIAL KNEE LEFT  Final   Special Requests NONE  Final   Gram Stain   Final    ABUNDANT WBC PRESENT, PREDOMINANTLY PMN NO ORGANISMS SEEN    Culture   Final    NO ANAEROBES ISOLATED Performed at [facility], [place] [address] [place], [place] [address]    Report Status 01/20/2022 FINAL  Final  Body fluid culture w Gram Stain     Status: None   Collection Time: 01/16/22 11:48 AM   Specimen: Synovium; Body Fluid  Result Value Ref Range Status   Specimen Description SYNOVIAL  Final   Special Requests  RIGHT KNEE  Final   Gram Stain   Final    ABUNDANT WBC PRESENT, PREDOMINANTLY PMN NO ORGANISMS SEEN    Culture   Final    NO GROWTH 3 DAYS Performed at [facility], [address] [place], [place] [address]    Report Status 01/19/2022 FINAL  Final    RADIOLOGY STUDIES/RESULTS: No results found.   LOS: 6 days   Oren Binet, MD  [facility]    To contact the attending provider between 7A-7P or the covering provider during after hours 7P-7A, please log into the web site [email or web address] and access using universal [place] password for that web site. If you do not have the password, please call the hospital operator.  01/21/2022, 10:39 AM

## 2022-01-21 NOTE — Consult Note (Signed)
[facility] for Infectious Disease    Date of Admission:  01/15/2022     Reason for Consult: Leukocytosis, concern for infection     Referring Physician: Dr Sloan Leiter  Current antibiotics: None   ASSESSMENT:    66 y.o. 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 Encephalopathy  RECOMMENDATIONS:    I think okay to continue observing off antibiotics as his cultures are negative and work-up thus far is more consistent with gout arthritis instead of septic arthritis.  Certainly possible to have both gouty arthritis and combination of septic arthritis at the same time, but he has been afebrile during this admission and all his other cultures have been negative.  Leukocytosis can be seen with gout and he has received steroids for this which may also explain his WBC which has been stable at 20,000 off antibiotics for a few days Suspect encephalopathy more likely due to uremia as opposed to sepsis.  We will continue to follow for improvement now that he has been started on hemodialysis His AV fistula site does not appear to be problematic at this time and there is no graft material present Will follow   Principal Problem:   Sepsis ([place]) Active Problems:   Obesity (BMI 30-39.9)   Essential hypertension   Anemia   AKI (acute kidney injury) ([place])   CKD (chronic kidney disease) stage 5, GFR less than 15 ml/min (HCC)   Swelling of left hand   SIRS (systemic inflammatory response syndrome) (HCC)   Acute metabolic encephalopathy   Hypoglycemia associated with type 2 diabetes mellitus (HCC)   Gout flare   Left knee pain   Current chronic use of systemic steroids   Prostate cancer (HCC)   Left arm weakness   Abnormal CXR   Paroxysmal atrial fibrillation (HCC)   MEDICATIONS:    Scheduled Meds:  calcitRIOL   0.25 mcg Oral Daily   carvedilol  25 mg Oral BID WC   Chlorhexidine Gluconate Cloth  6 each Topical Q0600   feeding supplement (NEPRO CARB STEADY)  237 mL Oral BID BM   insulin aspart  0-9 Units Subcutaneous TID WC   insulin aspart  8 Units Subcutaneous TID WC   insulin detemir  30 Units Subcutaneous BID   multivitamin  1 tablet Oral QHS   pantoprazole  40 mg Oral BID   predniSONE  30 mg Oral Q breakfast   sodium chloride flush  10-40 mL Intracatheter Q12H   sodium chloride flush  3 mL Intravenous Q12H   sodium chloride flush  3 mL Intravenous Q12H   Continuous Infusions:  sodium chloride     sodium chloride     anticoagulant sodium citrate     PRN Meds:.sodium chloride, sodium chloride, acetaminophen, hydrALAZINE, HYDROmorphone (DILAUDID) injection, lidocaine (PF), lidocaine-prilocaine, metoprolol tartrate, ondansetron (ZOFRAN) IV, pentafluoroprop-tetrafluoroeth, sodium chloride, sodium chloride flush, sodium chloride flush, sodium chloride flush  HPI:    [name] is a 66 y.o. male with a complicated past medical history as noted below which includes CKD stage V, type 2 diabetes, HFpEF, hypertension, hyperlipidemia whom was admitted on 01/15/2022 for further evaluation of confusion and hypoglycemia with findings concerning for gout arthritis of bilateral knees and further progression of his CKD to end-stage renal disease.  Upon admission, patient was initially treated with broad-spectrum antibiotics vancomycin,  metronidazole, cefepime on admission given concerns for sepsis.  Cefepime was continued for 3 days until being stopped on 01/17/2022.  Patient was also seen by orthopedic surgery due to bilateral knee pain with effusions in the setting of what was concerning for sepsis.  He underwent aspiration of the left knee on 8/14.  This showed a WBC of 24,000 and extracellular monosodium urate crystals consistent with pseudogout.  He underwent right knee aspiration as well on 8/15 with  15,000 WBC and intracellular monosodium urate crystals seen again.  Cultures from these aspirations have been finalized as negative and his blood cultures as well were finalized as no growth.  Patient was initially borderline febrile on admission at 100.3 F.  He has been afebrile since that time.  He received Solu-Medrol daily x3 days and has now been ordered for prednisone to start today.  His WBC which was 15 on admission has increased to 20 and has been stable for the past 3 days at this measurement.  Overnight, there was concern for a bowel movement mixed with blood and Hemoccult is positive.  His hemoglobin has also dropped down to below 7.  Patient is following with nephrology during this admission in the setting of his kidney failure and confusion likely due to uremia.  He had to start hemodialysis this admission.  He does have an aVF that was done in January 2023 and recently underwent left brachiocephalic fistula revision-superficialization on 01/09/2022.  Patient has also undergone a temporary HD catheter placement with IR on 8/17.  This was complicated from some bleeding from his catheter site which yesterday was noted to have resolved.  He received his first dialysis on 8/17 and a second dialysis yesterday.  Planning for next HD session tomorrow.   Past Medical History:  Diagnosis Date   Arthritis    Cardiomyopathy    Resolved with EF 55% 2011   CARDIOMYOPATHY 04/22/2006   CARDIOMYOPATHY 04/22/2006   2- D Echo (04/2010) : The EF is probably 55% with some hypokinesis at the base of the inferior wall. Wall thickness was increased in a pattern of mild LVH. Patient followed by Dr [place] (Cardiology)      CHF (congestive heart failure) ([place])    Chronic kidney disease    Diabetes mellitus    DIABETIC  RETINOPATHY 02/06/2007   GERD (gastroesophageal reflux disease)    Hyperlipemia    Hypertension    Iron deficiency 04/09/2018   NEPHROPATHY, DIABETIC 06/08/2006   Paroxysmal atrial tachycardia  (HCC)     Social History   Tobacco Use   Smoking status: Never    Passive exposure: Never   Smokeless tobacco: Never  Vaping Use   Vaping Use: Never used  Substance Use Topics   Alcohol use: No    Alcohol/week: 0.0 standard drinks of alcohol   Drug use: No    Family History  Problem Relation Age of Onset   Diabetes Mother    Diabetes Brother     No Known Allergies  Review of Systems  Unable to perform ROS: Mental status change    OBJECTIVE:   Blood pressure (!) 147/65, pulse 81, temperature 98.8 F (37.1 C), temperature source Axillary, resp. rate 18, height '5\' 11"'$  (1.803 m), weight 124.6 kg, SpO2 99 %. Body mass index is 38.31 kg/m.  Physical Exam Constitutional:      Comments: Chronically ill appearing. On HD.  Not in distress.   HENT:     Head: Normocephalic and atraumatic.  Eyes:  Extraocular Movements: Extraocular movements intact.     Conjunctiva/sclera: Conjunctivae normal.  Neck:     Comments: Right IJ non-tunneled HD line.  Pulmonary:     Effort: Pulmonary effort is normal. No respiratory distress.  Abdominal:     General: There is no distension.     Palpations: Abdomen is soft.     Tenderness: There is no abdominal tenderness.  Musculoskeletal:        General: Tenderness present.     Cervical back: Normal range of motion and neck supple.     Comments: He is very tender to palpation of bilateral knees R > L.  Left upper extremity mild swelling with approximated incision from recent vascular surgery procedure  Skin:    General: Skin is warm and dry.     Findings: No rash.  Neurological:     General: No focal deficit present.     Mental Status: He is disoriented.      Lab Results: Lab Results  Component Value Date   WBC 20.3 (H) 01/21/2022   HGB 6.7 (LL) 01/21/2022   HCT 20.4 (L) 01/21/2022   MCV 88.3 01/21/2022   PLT 155 01/21/2022    Lab Results  Component Value Date   NA 130 (L) 01/21/2022   K 3.9 01/21/2022   CO2 22  01/21/2022   GLUCOSE 233 (H) 01/21/2022   BUN 140 (H) 01/21/2022   CREATININE 8.00 (H) 01/21/2022   CALCIUM 7.8 (L) 01/21/2022   GFRNONAA 7 (L) 01/21/2022   GFRAA 23 (L) 05/04/2019    Lab Results  Component Value Date   ALT 72 (H) 01/21/2022   AST 75 (H) 01/21/2022   ALKPHOS 83 01/21/2022   BILITOT 1.0 01/21/2022       Component Value Date/Time   CRP 1.5 (H) 03/24/2009 0151       Component Value Date/Time   ESRSEDRATE 127 (H) 03/24/2009 0151    I have reviewed the micro and lab results in Epic.  Imaging: No results found.   Imaging independently reviewed in Epic.  [facility] [phone] pager 01/21/2022, 9:24 AM  I have personally spent 80 minutes involved in face-to-face and non-face-to-face activities for this patient on the day of the visit. Professional time spent includes the following activities: Preparing to see the patient (review of tests), Obtaining and/or reviewing separately obtained history (admission/discharge record), Performing a medically appropriate examination and/or evaluation , Ordering medications/tests/procedures, referring and communicating with other health care professionals, Documenting clinical information in the EMR, Independently interpreting results (not separately reported), Communicating results to the patient/family/caregiver, Counseling and educating the patient/family/caregiver and Care coordination (not separately reported).

## 2022-01-21 NOTE — Plan of Care (Signed)

## 2022-01-21 NOTE — Progress Notes (Signed)
[facility] Progress Note   Assessment/ Plan:    AKI on CKD V: In the setting of septic arthritis with confusion--> uremia.  I think this is the sentinel event that pushes him over into ESRD.  D/w pt and son- will need to start HD this admission.               - AVF will need to heal before it can be used--> will c/s IR for [facility], appreciate assistance  - nontunneled placed since he's got WBC, etc so will need to be converted when appropriate             - blood cultures are negative              - HD #1 overnight 01/18/22, poorly tolerated  - HD #2 t8/19  - appreciate IR helping with catheter oozing  - HD today 8/20 given lack of improvement in MS--> may need another session tomorrow, will follow closely   2.  L knee septic arthritis             - s/p arthrocentesis with ortho, appreciate assistance             - s/p vanc/ cefepime/ solumedrol, now off antibiotics and on pred  - ID following   3.  Hyperkalemia             - Lokelma daily, anticipate can stop after Monday   4.  Hyponatremia:             - volume- related, HD will correct this   5.  Elevated LFTs:             - marked elevation in the last 2 days, now improving             - stop atorvastatin             - RUQ [place] normal  6.  Encephalopathy:  - combination likely of uremia/ sepsis/ ? Liver  - ammonia 24 01/15/22  7.  Afib with RVR:  -s/p dilt gtt--> coreg  - Eliquis on hold  8. Bloody stools:  - getting blood today- hopefully we can give with HD  - stopped AC   8.  Dispo: inpt  Subjective:    Had bloody stools overnight, FOBT +, still encephalopathic, yelling out and having jerking.  BUN 140.     Objective:   BP 131/74   Pulse 90   Temp 98.8 F (37.1 C)   Resp 18   Ht '5\' 11"'$  (1.803 m)   Wt 124.6 kg   SpO2 99%   BMI 38.31 kg/m   Intake/Output Summary (Last 24 hours) at 01/21/2022 1037 Last data filed at 01/21/2022 0400 Gross per 24 hour  Intake 30 ml  Output 0 ml  Net 30 ml    Weight change:   Physical Exam: GEN NAD, lying in bed, appears uncomfortable, yelling out HEENT EOMI PERRL NECK NO JVD PULM  clear CV RRR  ABD + pannus EXT no LE edema NEURO able to respond but doesn't always make sense MSK L knee tenderness ACCESS L AVF + T/B, healing well, R IJ nontunneled HD catheter with no blood  Imaging: No results found.  Labs: BMET Recent Labs  Lab 01/15/22 1323 01/16/22 0410 01/17/22 0441 01/17/22 1005 01/18/22 0339 01/19/22 0321 01/20/22 0343 01/20/22 0820 01/21/22 0258  NA  --    < > 132* 129* 129* 130* 133* 133* 130*  K  --    < > 5.8* 5.4* 4.8 3.9 4.5 4.2 3.9  CL  --    < > 95* 93* 93* 92* 93* 95* 92*  CO2  --    < > 18* 16* 17* 19* 20* 19* 22  GLUCOSE  --    < > 367* 360* 360* 255* 178* 159* 233*  BUN  --    < > 145* 149* 170* 143* 177* 177* 140*  CREATININE  --    < > 8.49* 8.64* 9.54* 7.97* 10.06* 9.55* 8.00*  CALCIUM  --    < > 7.4* 7.5* 7.1* 7.6* 8.3* 7.9* 7.8*  PHOS 4.9*  --  9.7* 9.7* 10.1*  --   --  10.2*  --    < > = values in this interval not displayed.   CBC Recent Labs  Lab 01/15/22 1028 01/16/22 0410 01/17/22 0441 01/18/22 0339 01/19/22 0321 01/20/22 0343 01/21/22 0258  WBC 14.5*   < > 15.3* 16.5* 20.1* 20.4* 20.3*  NEUTROABS 11.4*  --  14.0*  --   --   --   --   HGB 9.3*   < > 8.1* 8.1* 8.0* 7.2* 6.7*  HCT 29.4*   < > 25.9* 24.6* 24.7* 22.0* 20.4*  MCV 91.9   < > 92.8 88.8 88.8 88.0 88.3  PLT 192   < > 163 151 179 167 155   < > = values in this interval not displayed.    Medications:     sodium chloride   Intravenous Once   acetaminophen  650 mg Oral Once   calcitRIOL  0.25 mcg Oral Daily   carvedilol  25 mg Oral BID WC   Chlorhexidine Gluconate Cloth  6 each Topical Q0600   diphenhydrAMINE  25 mg Intravenous Once   feeding supplement (NEPRO CARB STEADY)  237 mL Oral BID BM   heparin sodium (porcine)       insulin aspart  0-9 Units Subcutaneous TID WC   insulin aspart  8 Units Subcutaneous TID WC    insulin detemir  30 Units Subcutaneous BID   multivitamin  1 tablet Oral QHS   pantoprazole  40 mg Oral BID   predniSONE  30 mg Oral Q breakfast   sodium chloride flush  10-40 mL Intracatheter Q12H   sodium chloride flush  3 mL Intravenous Q12H   sodium chloride flush  3 mL Intravenous Q12H    Madelon Lips, MD 01/21/2022, 10:37 AM

## 2022-01-21 NOTE — Progress Notes (Signed)
Dr. Bridgett Larsson notified of critical lab value of Hgb 6.7. Order placed for type and Screen but no orders to transfuse at this time. Day Shift Team assess need for transfusion.

## 2022-01-21 NOTE — Progress Notes (Signed)
Progress Note  Patient Name: [name] Date of Encounter: 01/21/2022  [facility] Cardiologist: None   Subjective   Received Dilaudid during dialysis for pain (uncertain site, sometimes yelling out) and is now partly sedated.  Maintaining normal sinus rhythm. Very little bleeding from dialysis catheter site.  Inpatient Medications    Scheduled Meds:  sodium chloride   Intravenous Once   sodium chloride   Intravenous Once   acetaminophen  650 mg Oral Once   calcitRIOL  0.25 mcg Oral Daily   carvedilol  25 mg Oral BID WC   Chlorhexidine Gluconate Cloth  6 each Topical Q0600   diphenhydrAMINE  25 mg Intravenous Once   feeding supplement (NEPRO CARB STEADY)  237 mL Oral BID BM   insulin aspart  0-9 Units Subcutaneous TID WC   insulin aspart  8 Units Subcutaneous TID WC   insulin detemir  30 Units Subcutaneous BID   multivitamin  1 tablet Oral QHS   pantoprazole  40 mg Oral BID   predniSONE  30 mg Oral Q breakfast   sodium chloride flush  10-40 mL Intracatheter Q12H   sodium chloride flush  3 mL Intravenous Q12H   sodium chloride flush  3 mL Intravenous Q12H   Continuous Infusions:  sodium chloride     sodium chloride     anticoagulant sodium citrate     PRN Meds: sodium chloride, sodium chloride, acetaminophen, hydrALAZINE, HYDROmorphone (DILAUDID) injection, lidocaine (PF), lidocaine-prilocaine, metoprolol tartrate, ondansetron (ZOFRAN) IV, pentafluoroprop-tetrafluoroeth, sodium chloride, sodium chloride flush, sodium chloride flush, sodium chloride flush   Vital Signs    Vitals:   01/21/22 1100 01/21/22 1115 01/21/22 1122 01/21/22 1130  BP: (!) 144/67 (!) 140/66  (!) 152/80  Pulse: 84 86  74  Resp: (!) 26 (!) 24  20  Temp: 98.6 F (37 C) 98.6 F (37 C)  98.6 F (37 C)  TempSrc: Axillary     SpO2: 99% 99%  99%  Weight:   124.6 kg   Height:        Intake/Output Summary (Last 24 hours) at 01/21/2022 1227 Last data filed at 01/21/2022 1115 Gross per 24  hour  Intake 267.5 ml  Output 0 ml  Net 267.5 ml      01/21/2022   11:22 AM 01/19/2022    4:47 AM 01/18/2022    5:00 AM  Last 3 Weights  Weight (lbs) 274 lb 11.1 oz 274 lb 11.1 oz 272 lb 7.8 oz  Weight (kg) 124.6 kg 124.6 kg 123.6 kg      Telemetry    In NSR now, unable to review all his telemetry - Personally Reviewed  ECG    No new tracing - Personally Reviewed  Physical Exam  Sedated, frowning, eyes closed tight GEN: No acute distress.   Neck: No JVD Cardiac: RRR, no murmurs, rubs, or gallops.  Respiratory: Clear to auscultation bilaterally. GI: Soft, nontender, non-distended  MS: No edema; No deformity. Neuro:  Nonfocal  Psych: Normal affect   Labs    High Sensitivity Troponin:  No results for input(s): "TROPONINIHS" in the last 720 hours.   Chemistry Recent Labs  Lab 01/15/22 1323 01/16/22 0410 01/17/22 0441 01/17/22 1005 01/19/22 0321 01/20/22 0343 01/20/22 0820 01/21/22 0258  NA  --    < > 132*   < > 130* 133* 133* 130*  K  --    < > 5.8*   < > 3.9 4.5 4.2 3.9  CL  --    < >  95*   < > 92* 93* 95* 92*  CO2  --    < > 18*   < > 19* 20* 19* 22  GLUCOSE  --    < > 367*   < > 255* 178* 159* 233*  BUN  --    < > 145*   < > 143* 177* 177* 140*  CREATININE  --    < > 8.49*   < > 7.97* 10.06* 9.55* 8.00*  CALCIUM  --    < > 7.4*   < > 7.6* 8.3* 7.9* 7.8*  MG 1.9  --  2.4  --   --   --   --   --   PROT  --   --  6.4*   < > 6.1* 5.8*  --  5.5*  ALBUMIN  --   --  1.7*   < > 1.6* 1.6* 1.6* 1.6*  AST  --   --  510*   < > 345* 149*  --  75*  ALT  --   --  123*   < > 129* 95*  --  72*  ALKPHOS  --   --  133*   < > 133* 94  --  83  BILITOT  --   --  1.3*   < > 0.9 0.8  --  1.0  GFRNONAA  --    < > 6*   < > 7* 5* 5* 7*  ANIONGAP  --    < > 19*   < > 19* 20* 19* 16*   < > = values in this interval not displayed.    Lipids No results for input(s): "CHOL", "TRIG", "HDL", "LABVLDL", "LDLCALC", "CHOLHDL" in the last 168 hours.  Hematology Recent Labs  Lab  01/19/22 0321 01/20/22 0343 01/21/22 0258  WBC 20.1* 20.4* 20.3*  RBC 2.78* 2.50* 2.31*  HGB 8.0* 7.2* 6.7*  HCT 24.7* 22.0* 20.4*  MCV 88.8 88.0 88.3  MCH 28.8 28.8 29.0  MCHC 32.4 32.7 32.8  RDW 14.2 14.5 14.5  PLT 179 167 155   Thyroid  Recent Labs  Lab 01/15/22 1028  TSH 0.874    BNP Recent Labs  Lab 01/15/22 1028  BNP 497.7*    DDimer No results for input(s): "DDIMER" in the last 168 hours.   Radiology    No results found.  Cardiac Studies   ECHO 01/17/2022   1. Left ventricular ejection fraction, by estimation, is 55 to 60%. The  left ventricle has normal function. The left ventricle has no regional  wall motion abnormalities. There is mild concentric left ventricular  hypertrophy. Left ventricular diastolic  parameters were normal.   2. Right ventricular systolic function is normal. The right ventricular  size is normal. There is normal pulmonary artery systolic pressure.   3. The mitral valve is normal in structure. Trivial mitral valve  regurgitation. No evidence of mitral stenosis.   4. The aortic valve is tricuspid. Aortic valve regurgitation is not  visualized. No aortic stenosis is present.   5. The inferior vena cava is normal in size with greater than 50%  respiratory variability, suggesting right atrial pressure of 3 mmHg.   Patient Profile     66 y.o. 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 Has mild LVH and mild LA dilation on echo, normal LVEF.  Assessment & Plan  No recurrence of atrial fibrillation since increased dose of carvedilol. [facility] will sign off.   Medication Recommendations:  carvedilol 25 mg twice daily, eliquis 5 mg twice daily, atorvastatin 80 mg daily Other recommendations (labs, testing, etc):  outpatient 14-day event monitor to assess burden of atrial  fibrillation (once beyond acute illness) Follow up as an outpatient:  1-2 months.  For questions or updates, please contact [place] Please consult [email or web address] for contact info under        Signed, Sanda Klein, MD  01/21/2022, 12:27 PM

## 2022-01-22 DIAGNOSIS — K921 Melena: Secondary | ICD-10-CM

## 2022-01-22 DIAGNOSIS — Z7901 Long term (current) use of anticoagulants: Secondary | ICD-10-CM | POA: Diagnosis not present

## 2022-01-22 DIAGNOSIS — D62 Acute posthemorrhagic anemia: Secondary | ICD-10-CM | POA: Diagnosis not present

## 2022-01-22 DIAGNOSIS — G9341 Metabolic encephalopathy: Secondary | ICD-10-CM | POA: Diagnosis not present

## 2022-01-22 DIAGNOSIS — G9340 Encephalopathy, unspecified: Secondary | ICD-10-CM | POA: Diagnosis not present

## 2022-01-22 DIAGNOSIS — A419 Sepsis, unspecified organism: Secondary | ICD-10-CM | POA: Diagnosis not present

## 2022-01-22 DIAGNOSIS — R4182 Altered mental status, unspecified: Secondary | ICD-10-CM | POA: Diagnosis not present

## 2022-01-22 LAB — CBC
HCT: 25.4 % — ABNORMAL LOW (ref 39.0–52.0)
HCT: 26 % — ABNORMAL LOW (ref 39.0–52.0)
Hemoglobin: 8.3 g/dL — ABNORMAL LOW (ref 13.0–17.0)
Hemoglobin: 8.3 g/dL — ABNORMAL LOW (ref 13.0–17.0)
MCH: 28.1 pg (ref 26.0–34.0)
MCH: 27.9 pg (ref 26.0–34.0)
MCHC: 32.7 g/dL (ref 30.0–36.0)
MCHC: 31.9 g/dL (ref 30.0–36.0)
MCV: 86.1 fL (ref 80.0–100.0)
MCV: 87.2 fL (ref 80.0–100.0)
Platelets: 156 10*3/uL (ref 150–400)
Platelets: 168 10*3/uL (ref 150–400)
RBC: 2.95 MIL/uL — ABNORMAL LOW (ref 4.22–5.81)
RBC: 2.98 MIL/uL — ABNORMAL LOW (ref 4.22–5.81)
RDW: 16.5 % — ABNORMAL HIGH (ref 11.5–15.5)
RDW: 16.6 % — ABNORMAL HIGH (ref 11.5–15.5)
WBC: 22.1 10*3/uL — ABNORMAL HIGH (ref 4.0–10.5)
WBC: 19.7 10*3/uL — ABNORMAL HIGH (ref 4.0–10.5)
nRBC: 0.2 % (ref 0.0–0.2)
nRBC: 0.1 % (ref 0.0–0.2)

## 2022-01-22 LAB — TYPE AND SCREEN
ABO/RH(D): B POS
Antibody Screen: NEGATIVE
Unit division: 0
Unit division: 0

## 2022-01-22 LAB — RENAL FUNCTION PANEL
Albumin: 1.6 g/dL — ABNORMAL LOW (ref 3.5–5.0)
Anion gap: 18 — ABNORMAL HIGH (ref 5–15)
BUN: 110 mg/dL — ABNORMAL HIGH (ref 8–23)
CO2: 22 mmol/L (ref 22–32)
Calcium: 8.1 mg/dL — ABNORMAL LOW (ref 8.9–10.3)
Chloride: 95 mmol/L — ABNORMAL LOW (ref 98–111)
Creatinine, Ser: 6.83 mg/dL — ABNORMAL HIGH (ref 0.61–1.24)
GFR, Estimated: 8 mL/min — ABNORMAL LOW (ref >60)
Glucose, Bld: 181 mg/dL — ABNORMAL HIGH (ref 70–99)
Phosphorus: 7.7 mg/dL — ABNORMAL HIGH (ref 2.5–4.6)
Potassium: 4 mmol/L (ref 3.5–5.1)
Sodium: 135 mmol/L (ref 135–145)

## 2022-01-22 LAB — BPAM RBC
Blood Product Expiration Date: 202309132359
Blood Product Expiration Date: 202309142359
ISSUE DATE / TIME: 202308201051
ISSUE DATE / TIME: 202308201113
Unit Type and Rh: 7300
Unit Type and Rh: 7300

## 2022-01-22 LAB — GLUCOSE, CAPILLARY
Glucose-Capillary: 177 mg/dL — ABNORMAL HIGH (ref 70–99)
Glucose-Capillary: 289 mg/dL — ABNORMAL HIGH (ref 70–99)
Glucose-Capillary: 479 mg/dL — ABNORMAL HIGH (ref 70–99)
Glucose-Capillary: 516 mg/dL (ref 70–99)
Glucose-Capillary: 466 mg/dL — ABNORMAL HIGH (ref 70–99)
Glucose-Capillary: 410 mg/dL — ABNORMAL HIGH (ref 70–99)

## 2022-01-22 MED ORDER — INSULIN ASPART 100 UNIT/ML IJ SOLN
6.0000 [IU] | Freq: Once | INTRAMUSCULAR | Status: AC
Start: 2022-01-22 — End: 2022-01-22
  Administered 2022-01-22: 6 [IU] via SUBCUTANEOUS

## 2022-01-22 MED ORDER — INSULIN ASPART 100 UNIT/ML IJ SOLN
6.0000 [IU] | Freq: Three times a day (TID) | INTRAMUSCULAR | Status: DC
Start: 2022-01-22 — End: 2022-01-23
  Administered 2022-01-22 (×3): 6 [IU] via SUBCUTANEOUS

## 2022-01-22 MED ORDER — ABIRATERONE ACETATE 250 MG PO TABS
1000.0000 mg | ORAL_TABLET | Freq: Every day | ORAL | Status: DC
  Administered 2022-01-22 – 2022-02-02 (×12): 1000 mg via ORAL
  Filled 2022-01-22 (×14): qty 4

## 2022-01-22 MED ORDER — INSULIN DETEMIR 100 UNIT/ML ~~LOC~~ SOLN
24.0000 [IU] | Freq: Two times a day (BID) | SUBCUTANEOUS | Status: DC
Start: 2022-01-22 — End: 2022-01-23
  Administered 2022-01-22 (×2): 24 [IU] via SUBCUTANEOUS
  Filled 2022-01-22 (×4): qty 0.24

## 2022-01-22 NOTE — Progress Notes (Signed)
[facility] Progress Note   Assessment/ Plan:    AoCKD5, now ESRD  s/p L BC AVF 01/09/22, not ready for use Has Temp HD cath Will need tunneledcath prior to DC but WBC still up at 22.1 Start HD 8/17 Cont on THS schedule   2.  L knee septic arthritis             - s/p arthrocentesis with ortho, appreciate assistance             - s/p vanc/ cefepime/ solumedrol, now off antibiotics and on pred  - ID following   3.  Hyperkalemia             - off Lokelma, K stable   4.  Hyponatremia:             - volume- related, HD will correct this   5.  Elevated LFTs:             - improving             - stop atorvastatin             - RUQ [place] normal  6.  Encephalopathy:  - combination likely of uremia/ sepsis  - Improving  7.  Afib with RVR:  -s/p dilt gtt--> coreg  - Eliquis on hold  8. Bloody stools / hematochezia; GI FOllowing   8.  Dispo: inpt  Subjective:    HD yesterday for persistent AMS / myoclonus MOre awake, engaged, and appropriate toda Family at bedside     Objective:   BP 136/72 (BP Location: Right Wrist)   Pulse 85   Temp 99.3 F (37.4 C) (Oral)   Resp 20   Ht '5\' 11"'$  (1.803 m)   Wt 126.3 kg   SpO2 97%   BMI 38.83 kg/m  No intake or output data in the 24 hours ending 01/22/22 1224  Weight change:   Physical Exam: GEN NAD, lying in bed, appears uncomfortable, yelling out HEENT EOMI PERRL NECK NO JVD PULM  clear CV RRR  ABD + pannus EXT no LE edema NEURO able to respond but doesn't always make sense MSK L knee tenderness ACCESS L AVF + T/B, healing well, R IJ nontunneled HD catheter with no blood  Imaging: No results found.  Labs: BMET Recent Labs  Lab 01/15/22 1323 01/16/22 0410 01/17/22 0441 01/17/22 1005 01/18/22 0339 01/19/22 0321 01/20/22 0343 01/20/22 0820 01/21/22 0258 01/22/22 0405  NA  --    < > 132* 129* 129* 130* 133* 133* 130* 135  K  --    < > 5.8* 5.4* 4.8 3.9 4.5 4.2 3.9 4.0  CL  --    < > 95* 93* 93*  92* 93* 95* 92* 95*  CO2  --    < > 18* 16* 17* 19* 20* 19* 22 22  GLUCOSE  --    < > 367* 360* 360* 255* 178* 159* 233* 181*  BUN  --    < > 145* 149* 170* 143* 177* 177* 140* 110*  CREATININE  --    < > 8.49* 8.64* 9.54* 7.97* 10.06* 9.55* 8.00* 6.83*  CALCIUM  --    < > 7.4* 7.5* 7.1* 7.6* 8.3* 7.9* 7.8* 8.1*  PHOS 4.9*  --  9.7* 9.7* 10.1*  --   --  10.2*  --  7.7*   < > = values in this interval not displayed.    CBC Recent Labs  Lab 01/17/22 0441  01/18/22 0339 01/20/22 0343 01/21/22 0258 01/21/22 1700 01/22/22 0405  WBC 15.3*   < > 20.4* 20.3* 25.4* 22.1*  NEUTROABS 14.0*  --   --   --   --   --   HGB 8.1*   < > 7.2* 6.7* 9.5* 8.3*  HCT 25.9*   < > 22.0* 20.4* 27.9* 25.4*  MCV 92.8   < > 88.0 88.3 83.5 86.1  PLT 163   < > 167 155 168 156   < > = values in this interval not displayed.     Medications:     sodium chloride   Intravenous Once   sodium chloride   Intravenous Once   abiraterone acetate  1,000 mg Oral Daily   acetaminophen  650 mg Oral Once   calcitRIOL  0.25 mcg Oral Daily   carvedilol  25 mg Oral BID WC   Chlorhexidine Gluconate Cloth  6 each Topical Q0600   diphenhydrAMINE  25 mg Intravenous Once   feeding supplement (NEPRO CARB STEADY)  237 mL Oral BID BM   insulin aspart  0-9 Units Subcutaneous TID WC   insulin aspart  6 Units Subcutaneous TID WC   insulin detemir  24 Units Subcutaneous BID   multivitamin  1 tablet Oral QHS   pantoprazole  40 mg Oral BID   predniSONE  20 mg Oral Q breakfast   sodium chloride flush  10-40 mL Intracatheter Q12H   sodium chloride flush  3 mL Intravenous Q12H   sodium chloride flush  3 mL Intravenous Q12H    Madelon Lips, MD 01/22/2022, 12:24 PM

## 2022-01-22 NOTE — TOC Progression Note (Signed)
Transition of Care [facility]) - Progression Note    Patient Details  Name: [name] MRN: [id number] Date of Birth: 1955/10/20  Transition of Care [facility]) CM/SW [place], [place] Phone Number: 01/22/2022, 2:42 PM  Clinical Narrative:    CSW followed up with pt's family and they confirmed their SNF choice is [facility]. Per MD, pt is not medically ready. CSW updated Renal Navigator and facility. TOC will continue to follow for DC needs.   Expected Discharge Plan: Skilled Nursing Facility Barriers to Discharge: Ship broker, Continued Medical Work up, Waiting for outpatient dialysis, SNF Pending bed offer  Expected Discharge Plan and Services Expected Discharge Plan: [place] In-house Referral: Clinical Social Work   Post Acute Care Choice: [place] Living arrangements for the past 2 months: Single Family Home                                       Social Determinants of Health (SDOH) Interventions    Readmission Risk Interventions     No data to display

## 2022-01-22 NOTE — Progress Notes (Signed)
Progress Note    01/22/2022 9:09 PM  Subjective:  vague L arm pain, poor historian   Vitals:   01/22/22 1600 01/22/22 2040  BP: (!) 145/63 (!) 155/73  Pulse: 83 75  Resp: (!) 23 14  Temp: 99.1 F (37.3 C) 97.8 F (36.6 C)  SpO2: 98% 98%   Physical Exam Lungs:  non labored Incisions:  L arm incision c/d/i Extremities:  palpable L radial pulse, palpable thrill throughout upper arm Neurologic: A&O  CBC    Component Value Date/Time   WBC 19.7 (H) 01/22/2022 1643   RBC 2.98 (L) 01/22/2022 1643   HGB 8.3 (L) 01/22/2022 1643   HGB 13.0 04/08/2018 1012   HCT 26.0 (L) 01/22/2022 1643   HCT 38.9 04/08/2018 1012   PLT 168 01/22/2022 1643   PLT 218 04/08/2018 1012   MCV 87.2 01/22/2022 1643   MCV 86 04/08/2018 1012   MCH 27.9 01/22/2022 1643   MCHC 31.9 01/22/2022 1643   RDW 16.6 (H) 01/22/2022 1643   RDW 13.4 04/08/2018 1012   LYMPHSABS 0.5 (L) 01/17/2022 0441   MONOABS 0.7 01/17/2022 0441   EOSABS 0.0 01/17/2022 0441   BASOSABS 0.0 01/17/2022 0441    BMET    Component Value Date/Time   NA 135 01/22/2022 0405   NA 142 05/04/2019 1011   K 4.0 01/22/2022 0405   CL 95 (L) 01/22/2022 0405   CO2 22 01/22/2022 0405   GLUCOSE 181 (H) 01/22/2022 0405   BUN 110 (H) 01/22/2022 0405   BUN 58 (H) 05/04/2019 1011   CREATININE 6.83 (H) 01/22/2022 0405   CREATININE 1.93 (H) 06/14/2014 1402   CALCIUM 8.1 (L) 01/22/2022 0405   GFRNONAA 8 (L) 01/22/2022 0405   GFRNONAA 37 (L) 06/14/2014 1402   GFRAA 23 (L) 05/04/2019 1011   GFRAA 43 (L) 06/14/2014 1402    INR    Component Value Date/Time   INR 1.3 (H) 01/15/2022 1028     Intake/Output Summary (Last 24 hours) at 01/22/2022 2109 Last data filed at 01/22/2022 1700 Gross per 24 hour  Intake 974 ml  Output --  Net 974 ml      Assessment/Plan:  66 y.o. male s/p superficialization of the left BC fistula  L hand well perfused with palpable radial pulse Patent fistula with easily palpable thrill Encouraged elevation  of L arm and to exercise L hand; OT consulted for eval and treatment due to pain with PROM Nothing further to add from vascular standpoint, can start transitioning from [facility] to L arm AV fistula in about 2-3 more weeks   Dagoberto Ligas, PA-C [facility] [phone] 01/22/2022 9:09 PM  VASCULAR STAFF ADDENDUM: I have independently interviewed and examined the patient. I agree with the above.  I do not think Ilija's poor function in the hand is from steal syndrome as patient has an easily palpable 2+ pulse in the left wrist. The swelling in the left arm is interesting as his recent fistula surgery was a simple superficialization.  In most cases, left upper extremity swelling occurs at the time of fistula creation due to outflow stenosis.  On physical exam, Ivo has an excellent thrill. I am happy to perform a fistulogram to rule out central stenosis as an etiology for his left upper extremity swelling, but would like to wait 2 weeks as accessing the newly-superficialized fistula could threaten it. Recommend elevation, compression.  Cassandria Santee, MD [facility] Phone Number: [phone] 01/22/2022 9:09  PM

## 2022-01-22 NOTE — Progress Notes (Signed)
Pt has critical cbg of 516. Dr. Sloan Leiter made aware. Ordered to given 20 units of novolog.

## 2022-01-22 NOTE — Progress Notes (Signed)
  Progress Note    01/22/2022 9:37 AM  Subjective:  vague L arm pain, poor historian   Vitals:   01/22/22 0000 01/22/22 0756  BP: 139/79 (!) 166/65  Pulse:  79  Resp:  (!) 21  Temp: 98.8 F (37.1 C) 98.2 F (36.8 C)  SpO2:  98%   Physical Exam Lungs:  non labored Incisions:  L arm incision c/d/i Extremities:  palpable L radial pulse, palpable thrill throughout upper arm Neurologic: A&O  CBC    Component Value Date/Time   WBC 22.1 (H) 01/22/2022 0405   RBC 2.95 (L) 01/22/2022 0405   HGB 8.3 (L) 01/22/2022 0405   HGB 13.0 04/08/2018 1012   HCT 25.4 (L) 01/22/2022 0405   HCT 38.9 04/08/2018 1012   PLT 156 01/22/2022 0405   PLT 218 04/08/2018 1012   MCV 86.1 01/22/2022 0405   MCV 86 04/08/2018 1012   MCH 28.1 01/22/2022 0405   MCHC 32.7 01/22/2022 0405   RDW 16.5 (H) 01/22/2022 0405   RDW 13.4 04/08/2018 1012   LYMPHSABS 0.5 (L) 01/17/2022 0441   MONOABS 0.7 01/17/2022 0441   EOSABS 0.0 01/17/2022 0441   BASOSABS 0.0 01/17/2022 0441    BMET    Component Value Date/Time   NA 135 01/22/2022 0405   NA 142 05/04/2019 1011   K 4.0 01/22/2022 0405   CL 95 (L) 01/22/2022 0405   CO2 22 01/22/2022 0405   GLUCOSE 181 (H) 01/22/2022 0405   BUN 110 (H) 01/22/2022 0405   BUN 58 (H) 05/04/2019 1011   CREATININE 6.83 (H) 01/22/2022 0405   CREATININE 1.93 (H) 06/14/2014 1402   CALCIUM 8.1 (L) 01/22/2022 0405   GFRNONAA 8 (L) 01/22/2022 0405   GFRNONAA 37 (L) 06/14/2014 1402   GFRAA 23 (L) 05/04/2019 1011   GFRAA 43 (L) 06/14/2014 1402    INR    Component Value Date/Time   INR 1.3 (H) 01/15/2022 1028     Intake/Output Summary (Last 24 hours) at 01/22/2022 9357 Last data filed at 01/21/2022 1215 Gross per 24 hour  Intake 925 ml  Output 0 ml  Net 925 ml     Assessment/Plan:  66 y.o. male s/p superficialization of the left BC fistula  L hand well perfused with palpable radial pulse Patent fistula with easily palpable thrill Encouraged elevation of L arm  and to exercise L hand; OT consulted for eval and treatment due to pain with PROM Nothing further to add from vascular standpoint, can start transitioning from [facility] to L arm AV fistula in about 2-3 more weeks   Dagoberto Ligas, PA-C [facility] [phone] 01/22/2022 9:37 AM

## 2022-01-22 NOTE — Consult Note (Addendum)
Consultation  Referring Provider:  Dr. Sloan Leiter    Primary Care Physician:  Sonia Side., FNP Primary Gastroenterologist:  Previously Dr. Deatra Ina in 2009       Reason for Consultation:   GI Bleed           HPI:   [name] is a 66 y.o. 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  He was found to have sepsis thought possibly from septic arthritis/gout.  We are consulted now in regards to a GI bleed which developed over the weekend.    Per previous physicians notes patient had initial small episode of hematochezia overnight and in the afternoon on 8/20 and hemoglobin dropped to 6.7, he was given 2 units of PRBCs with HD, this morning patient had another episode of hematochezia per nursing staff which was quite significant.    Today, patient is seen with his son by his bedside.  His son explains that he started with a small amount of bright red blood in his stool yesterday and had a little bit more this morning per nursing notes.  His son tells me that he was "out of it" over the past couple of days but this morning is much more coherent and was able to eat a full meal.  Patient denies any abdominal pain or previous bleeding prior to hospitalization.  Son tells me the bleeding started after they gave him a dose of blood thinner which they have since stopped again.    Denies fever, chills, weight loss, abdominal pain, heartburn, reflux, nausea or vomiting.   Hospital course: Significant events: 8/14>> admit for confusion/gouty arthritis/progression to ESRD. 8/16>> worsening transaminases-renal planning HD. 8/17>> TDC placed-first HD planned. 8/18>> first HD cut  short-A-fib RVR-Cardizem drip started.  Oozing from nontunneled HD cath site.  Converted to sinus rhythm. 8/19>> brief run of RVR.  Tolerated HD without any incident. 8/20>> more encephalopathic-getting second HD-1 episode of small-volume hematochezia overnight and in the afternoon.  Hemoglobin dropped to 6.7-given 2 units of PRBC with HD--> 9.5 8/21>> less encephalopathic-answering simple questions-another episode (third) of hematochezia earlier this morning with problem hemoglobin from 9.5--> 8.3   Significant studies: 8/15>> MRI brain: No acute CVA. 8/15>> renal ultrasound: No hydronephrosis. 8/14>> left knee synovial fluid: WBC 23,835, extracellular sodium urate crystals. 8/15>> right knee synovial fluid: WBC 15,600, intracellular monosodium urate crystals. 8/16>> Echo: EF 55-60% 8/16>> RUQ ultrasound: No major abnormalities noted.   Significant microbiology data: 8/14>> blood culture: No growth 8/14>> left knee synovial fluid: No growth 8/15>> right knee synovial fluid: No growth   Procedures: 8/14>> left knee arthrocentesis 8/15>> right knee arthrocentesis 8/17>> tunneled HD catheter placement by IR.  GI history: 02/26/2008 colonoscopy Dr. Deatra Ina with one 3 mm polyp removed from the descending colon and one 4 mm polyp removed from the transverse colon; pathology showed tubular adenoma; repeat recommended in 5 years   Past Medical History:  Diagnosis Date   Arthritis    Cardiomyopathy    Resolved with EF 55% 2011   CARDIOMYOPATHY 04/22/2006   CARDIOMYOPATHY 04/22/2006   2- D Echo (04/2010) : The EF is probably 55% with some hypokinesis at the base of the inferior wall. Wall thickness was increased  in a pattern of mild LVH. Patient followed by Dr Martinique (Cardiology)      CHF (congestive heart failure) ([place])    Chronic kidney disease    Diabetes mellitus    DIABETIC  RETINOPATHY 02/06/2007   GERD (gastroesophageal reflux disease)    Hyperlipemia    Hypertension    Iron  deficiency 04/09/2018   NEPHROPATHY, DIABETIC 06/08/2006   Paroxysmal atrial tachycardia (HCC)     Past Surgical History:  Procedure Laterality Date   AV FISTULA PLACEMENT Left 06/06/2021   Procedure: LEFT BRACHIOCEPHALIC ARTERIOVENOUS (AV) FISTULA CREATION;  Surgeon: Broadus John, MD;  Location: [facility] OR;  Service: Vascular;  Laterality: Left;  PERIPHERAL NERVE BLOCK   CARDIAC CATHETERIZATION  Dec 2007   normal; EF- 20-25%   FISTULA SUPERFICIALIZATION Left 01/09/2022   Procedure: LEFT BRACHIOCEPHALIC FISTULA SUPERFICIALIZATION;  Surgeon: Broadus John, MD;  Location: [place];  Service: Vascular;  Laterality: Left;  PERIPHERAL NERVE BLOCK   IR FLUORO GUIDE CV LINE RIGHT  01/18/2022   IR US GUIDE VASC ACCESS RIGHT  01/18/2022    Family History  Problem Relation Age of Onset   Diabetes Mother    Diabetes Brother     Social History   Tobacco Use   Smoking status: Never    Passive exposure: Never   Smokeless tobacco: Never  Vaping Use   Vaping Use: Never used  Substance Use Topics   Alcohol use: No    Alcohol/week: 0.0 standard drinks of alcohol   Drug use: No    Prior to Admission medications   Medication Sig Start Date End Date Taking? Authorizing Provider  abiraterone acetate (ZYTIGA) 250 MG tablet Take 1,000 mg by mouth daily. 09/21/21  Yes [provider]  acetaminophen (TYLENOL) 500 MG tablet Take 500-1,000 mg by mouth every 6 (six) hours as needed for moderate pain.   Yes [provider]  aspirin 81 MG tablet Take 1 tablet (81 mg total) by mouth daily. 12/19/15  Yes Debbrah Alar, NP  atorvastatin (LIPITOR) 80 MG tablet Take 1 tablet (80 mg total) by mouth daily. 03/02/19  Yes Katherine Roan, MD  calcitRIOL (ROCALTROL) 0.25 MCG capsule Take 0.25 mcg by mouth daily. 11/01/20  Yes [provider]  carvedilol (COREG) 25 MG tablet TAKE 1 TABLET BY MOUTH TWICE DAILY WITH A MEAL 05/27/19  Yes Katherine Roan, MD  furosemide (LASIX) 80 MG  tablet Take 80 mg by mouth 2 (two) times daily. 12/09/19  Yes [provider]  glipiZIDE (GLUCOTROL) 10 MG tablet Take 10 mg by mouth 2 (two) times daily. 07/22/20  Yes [provider]  hydrALAZINE (APRESOLINE) 50 MG tablet Take 1.5 tablets (75 mg total) by mouth 3 (three) times daily. Patient taking differently: Take 50 mg by mouth in the morning and at bedtime. 05/04/19  Yes Jeanmarie Hubert, MD  INSULIN LISPRO, 1 UNIT DIAL, [place] Inject 15 Units into the skin 3 (three) times daily. 200 units/ml 05/20/19  Yes [provider]  LEVEMIR 100 UNIT/ML injection Inject 40 Units into the skin 2 (two) times daily. 08/23/20  Yes [provider]  oxyCODONE (OXY IR/ROXICODONE) 5 MG immediate release tablet Take 1 tablet (5 mg total) by mouth every 6 (six) hours as needed (for pain score of 1-4). 01/09/22  Yes Ulyses Amor, PA-C  predniSONE (DELTASONE) 5 MG tablet Take 5 mg by mouth daily. 12/25/21  Yes [provider]  BAYER MICROLET LANCETS lancets Check blood sugar 3 times a day  as instructed 10/03/17   Katherine Roan, MD  Blood Glucose Monitoring Suppl (ONE TOUCH ULTRA MINI) w/Device KIT 1 strip by Other route 3 (three) times daily. 11/24/15   Corky Sox, MD  glucose blood (ONE TOUCH ULTRA TEST) test strip The patient is insulin requiring, ICD 10 code E11.65. The patient tests 3 times per day. 05/16/18   Forde Dandy, PharmD  Insulin Pen Needle 31G X 6 MM MISC 1 Stick by Other route 3 (three) times daily. Use with insulin and liraglutide. 10/03/17   Katherine Roan, MD  metFORMIN (GLUCOPHAGE) 500 MG tablet Take 1 tablet (500 mg total) by mouth 2 (two) times daily with a meal. Patient not taking: Reported on 06/05/2021 05/06/18 05/06/19  Katherine Roan, MD  insulin aspart protamine - aspart (NOVOLOG MIX 70/30 FLEXPEN) (70-30) 100 UNIT/ML FlexPen Inject 60U into the skin in the morning with breakfast and in the evening 50U with dinner 09/29/18 03/02/19  Katherine Roan, MD    Current Facility-Administered Medications  Medication Dose Route Frequency Provider Last Rate Last Admin   0.9 %  sodium chloride infusion (Manually program via Guardrails IV Fluids)   Intravenous Once Ghimire, Henreitta Leber, MD       0.9 %  sodium chloride infusion (Manually program via Guardrails IV Fluids)   Intravenous Once Ghimire, Henreitta Leber, MD       0.9 %  sodium chloride infusion  250 mL Intravenous PRN Levie Heritage, MD       0.9 %  sodium chloride infusion  250 mL Intravenous PRN Wynetta Fines T, MD       abiraterone acetate (ZYTIGA) tablet 1,000 mg  1,000 mg Oral Daily Ghimire, Henreitta Leber, MD       acetaminophen (TYLENOL) tablet 650 mg  650 mg Oral Q4H PRN Wynetta Fines T, MD   650 mg at 01/17/22 1747   acetaminophen (TYLENOL) tablet 650 mg  650 mg Oral Once Jonetta Osgood, MD       anticoagulant sodium citrate solution 5 mL  5 mL Intracatheter Once Madelon Lips, MD       calcitRIOL (ROCALTROL) capsule 0.25 mcg  0.25 mcg Oral Daily Wynetta Fines T, MD   0.25 mcg at 01/22/22 0903   carvedilol (COREG) tablet 25 mg  25 mg Oral BID WC Nahser, Wonda Cheng, MD   25 mg at 01/22/22 5361   Chlorhexidine Gluconate Cloth 2 % PADS 6 each  6 each Topical Q0600 Madelon Lips, MD   6 each at 01/22/22 0656   diphenhydrAMINE (BENADRYL) injection 25 mg  25 mg Intravenous Once Jonetta Osgood, MD       feeding supplement (NEPRO CARB STEADY) liquid 237 mL  237 mL Oral BID BM Jonetta Osgood, MD   237 mL at 01/22/22 0905   hydrALAZINE (APRESOLINE) injection 5 mg  5 mg Intravenous Q6H PRN Wynetta Fines T, MD       HYDROmorphone (DILAUDID) injection 0.5 mg  0.5 mg Intravenous Q4H PRN Wynetta Fines T, MD   0.5 mg at 01/22/22 4431   insulin aspart (novoLOG) injection 0-9 Units  0-9 Units Subcutaneous TID WC Lequita Halt, MD   2 Units at 01/22/22 0904   insulin aspart (novoLOG) injection 6 Units  6 Units Subcutaneous TID WC Jonetta Osgood, MD   6 Units at 01/22/22 0859   insulin detemir  (LEVEMIR) injection 24 Units  24 Units Subcutaneous BID Jonetta Osgood, MD   24  Units at 01/22/22 0905   lidocaine (PF) (XYLOCAINE) 1 % injection 5 mL  5 mL Intradermal PRN Madelon Lips, MD       lidocaine-prilocaine (EMLA) cream 1 Application  1 Application Topical PRN Madelon Lips, MD       metoprolol tartrate (LOPRESSOR) injection 5 mg  5 mg Intravenous Q4H PRN Jonetta Osgood, MD   5 mg at 01/21/22 1701   multivitamin (RENA-VIT) tablet 1 tablet  1 tablet Oral QHS Jonetta Osgood, MD   1 tablet at 01/21/22 2114   ondansetron [facility]) injection 4 mg  4 mg Intravenous Q6H PRN Wynetta Fines T, MD   4 mg at 01/16/22 0413   pantoprazole (PROTONIX) EC tablet 40 mg  40 mg Oral BID Cozart, Jarrett Soho, RPH   40 mg at 01/22/22 2130   pentafluoroprop-tetrafluoroeth (GEBAUERS) aerosol 1 Application  1 Application Topical PRN Madelon Lips, MD       predniSONE (DELTASONE) tablet 20 mg  20 mg Oral Q breakfast Jonetta Osgood, MD   20 mg at 01/22/22 0903   sodium chloride (OCEAN) 0.65 % nasal spray 1 spray  1 spray Each Nare PRN Jonetta Osgood, MD   1 spray at 01/20/22 1502   sodium chloride flush (NS) 0.9 % injection 10-40 mL  10-40 mL Intracatheter Q12H Ghimire, Henreitta Leber, MD   10 mL at 01/22/22 1118   sodium chloride flush (NS) 0.9 % injection 10-40 mL  10-40 mL Intracatheter PRN Jonetta Osgood, MD       sodium chloride flush (NS) 0.9 % injection 3 mL  3 mL Intravenous Q12H Levie Heritage, MD   3 mL at 01/22/22 1119   sodium chloride flush (NS) 0.9 % injection 3 mL  3 mL Intravenous PRN Levie Heritage, MD       sodium chloride flush (NS) 0.9 % injection 3 mL  3 mL Intravenous Q12H Wynetta Fines T, MD   3 mL at 01/22/22 1118   sodium chloride flush (NS) 0.9 % injection 3 mL  3 mL Intravenous PRN Lequita Halt, MD        Allergies as of 01/15/2022   (No Known Allergies)     Review of Systems:    Constitutional: No weight loss, fever or chills Skin: No rash  Cardiovascular: No  chest pain Respiratory: No SOB  Gastrointestinal: See HPI and otherwise negative Genitourinary: No dysuria  Neurological: No headache, dizziness or syncope Musculoskeletal: No new muscle or joint pain Hematologic: No bruising Psychiatric: No history of depression or anxiety    Physical Exam:  Vital signs in last 24 hours: Temp:  [98.2 F (36.8 C)-99.7 F (37.6 C)] 99.3 F (37.4 C) (08/21 1130) Pulse Rate:  [79-93] 85 (08/21 1130) Resp:  [15-21] 20 (08/21 1130) BP: (136-166)/(65-79) 136/72 (08/21 1130) SpO2:  [97 %-99 %] 97 % (08/21 1130) Weight:  [124.6 kg-126.3 kg] 126.3 kg (08/21 0500) Last BM Date : 01/20/22 General:   Pleasant overweight AA male appears to be in NAD, Well developed, Well nourished, alert and cooperative Head:  Normocephalic and atraumatic. Eyes:   PEERL, EOMI. No icterus. Conjunctiva pink. Ears:  Normal auditory acuity. Neck:  Supple Throat: Oral cavity and pharynx without inflammation, swelling or lesion. Teeth in good condition. Lungs: Respirations even and unlabored. Lungs clear to auscultation bilaterally.   No wheezes, crackles, or rhonchi.  Heart: Normal S1, S2. No MRG. Regular rate and rhythm. No peripheral edema, cyanosis or pallor.  Abdomen:  Soft, nondistended, nontender.  No rebound or guarding. Normal bowel sounds. No appreciable masses or hepatomegaly. Rectal:  Not performed.  Msk:  Symmetrical without gross deformities. Peripheral pulses intact.  Extremities:  Without edema, no deformity or joint abnormality.  Neurologic:  Alert and  oriented x4;  grossly normal neurologically.  Skin:   Dry and intact without significant lesions or rashes. Psychiatric: Demonstrates good judgement and reason without abnormal affect or behaviors.   LAB RESULTS: Recent Labs    01/21/22 0258 01/21/22 1700 01/22/22 0405  WBC 20.3* 25.4* 22.1*  HGB 6.7* 9.5* 8.3*  HCT 20.4* 27.9* 25.4*  PLT 155 168 156   BMET Recent Labs    01/20/22 0820 01/21/22 0258  01/22/22 0405  NA 133* 130* 135  K 4.2 3.9 4.0  CL 95* 92* 95*  CO2 19* 22 22  GLUCOSE 159* 233* 181*  BUN 177* 140* 110*  CREATININE 9.55* 8.00* 6.83*  CALCIUM 7.9* 7.8* 8.1*   LFT Recent Labs    01/21/22 0258 01/22/22 0405  PROT 5.5*  --   ALBUMIN 1.6* 1.6*  AST 75*  --   ALT 72*  --   ALKPHOS 83  --   BILITOT 1.0  --      Impression / Plan:   Impression: 1.  Hematochezia: First episode on 01/21/2022, last episode this morning, 3 in total, currently Eliquis on hold, last colonoscopy in 2009 with 2 tubular adenomas, repeat was recommended in 5 years, hemoglobin trending down 6.7 -> 3 units PRBCs-->9.5--> 8.3; consider diverticular versus other 2.  SIRS due to acute gouty arthritis involving bilateral knees and numerous joints of the left hand 3.  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  A-fib with RVR: Thought triggered by uremia/acute illness, now maintaining sinus rhythm on oral beta-blocker, Eliquis on hold due to ongoing lower GI bleed 5.  Transaminitis: Unclear etiology but suspected likely due to medication, trending down, right upper quadrant ultrasound unremarkable, statin on hold 6.  Morbid obesity 7.  Prostate cancer  Plan: 1.  We will likely need to consider a colonoscopy +/- EGD this hospitalization as it has been over 10 years since patient's last and Eliquis is recommended.  Patient had encephalopathy over the past 48 hours, seems some better today, we will likely wait until least tomorrow to start prepping the patient for procedures. 2.  Martin Majestic ahead and put the patient on a clear liquid diet starting tomorrow morning 3.  Continue to monitor hemoglobin with transfusion as needed less than 7 4.  Agree with Pantoprazole 40 twice daily 5.  We will discuss timing of possible procedures with Dr. Fuller Plan.  Thank you for your kind consultation, we will continue to follow.  Lavone Nian [facility]  01/22/2022, 11:48 AM    Attending  Physician Note   I have taken a history, reviewed the chart and examined the patient. I performed a substantive portion of this encounter, including complete performance of at least one of the key components, in conjunction with the APP. I agree with the APP's note, impression and recommendations with my edits. My additional impressions and recommendations are as follows.   *Acute painless hematochezia on Eliquis. R/O diverticulosis, AVMs, neoplasm, ulcer, other. Colonoscopy +/- EGD when mental status has improved, pain / mobility from bilateral knee gout has improved and after Eliquis washout. Pantoprazole 40 mg bid for now.   *ABL anemia. Trend CBC  *AFib. Eliquis on hold.   *SIRS with acute gouty arthritis in bilateral  knees, multiple joints in left hand on Prednisone  *Mildly elevated transaminases, suspect reactive due to SIRS or medication induced. Trend LFTs for now.   *CKD stage V progressed to HD this admission   *Prostate cancer on Lupron, Abiraterone  *DM  *Obesity  Lucio Edward, MD [facility] See [facility], [facility], for our on call provider

## 2022-01-22 NOTE — Progress Notes (Addendum)
Following pt's case to assist with out-pt HD arrangements. Pt is for snf placement. Contacted CSW to inquire if snf has been chosen so pt can be clipped to appropriate clinic based on snf placement. Awaiting response.   Melven Sartorius Renal Navigator [phone]  Addendum at 3:48 pm: Advised by CSW that pt has accepted a bed at [facility]. SNF requires a MWF at [facility]. Referral submitted to [facility] admissions for review. Will assist as needed.

## 2022-01-22 NOTE — Progress Notes (Addendum)
PROGRESS NOTE        PATIENT DETAILS Name: [name] Age: 66 y.o. Sex: male Date of Birth: 01-Mar-1956 Admit Date: 01/15/2022 Admitting Physician Lequita Halt, MD [id number], Malva Limes., FNP  Brief Summary: Patient is a 66 y.o.  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  See below for further details.  Significant events: 8/14>> admit for confusion/gouty arthritis/progression to ESRD. 8/16>> worsening transaminases-renal planning HD. 8/17>> TDC placed-first HD planned. 8/18>> Afib RVR with first HD session-HD cut short. Spontaneously converted to sinus rhythm.Oozing from nontunneled HD cath site.  8/19>> Brief run of RVR.  Tolerated HD without any incident. 8/20>> Worsening encephalopathy-urgent HD, 2 episodes of painless small-volume hematochezia. Hemoglobin dropped to 6.7-given 2 units of PRBC with HD.  8/21>> less encephalopathic-answering simple questions-another episode (third) of hematochezia earlier this morning.  Significant studies: 8/15>> MRI brain: No acute CVA. 8/15>> renal ultrasound: No hydronephrosis. 8/14>> left knee synovial fluid: WBC 23,835, extracellular sodium urate crystals. 8/15>> right knee synovial fluid: WBC 15,600, intracellular monosodium urate crystals. 8/16>> Echo: EF 55-60% 8/16>> RUQ ultrasound: No major abnormalities noted.  Significant microbiology data: 8/14>> blood culture: No growth 8/14>> left knee synovial fluid: No growth 8/15>> right knee synovial fluid: No growth  Procedures: 8/14>> left knee arthrocentesis 8/15>> right knee arthrocentesis 8/17>> tunneled HD catheter placement by IR.  Consults: Nephrology. Orthopedics Cardiology Vascular ID GI  Subjective: Less confused-answers simple questions  appropriately.  Still with pain in his thigh/knee joint/left arm/right arm.  Maintaining sinus rhythm.  Discussed with night RN-patient had significant amount of hematochezia earlier this morning.   Objective: Vitals: Blood pressure (!) 166/65, pulse 79, temperature 98.2 F (36.8 C), temperature source Oral, resp. rate (!) 21, height '5\' 11"'$  (1.803 m), weight 126.3 kg, SpO2 98 %.   Exam: Gen Exam: Less confused.  Not in any distress but once occasionally yell out when I palpate his extremities. HEENT:atraumatic, normocephalic Chest: B/L clear to auscultation anteriorly CVS:S1S2 regular Abdomen:soft non tender, non distended Extremities: Left arm swelling unchanged. Neurology: Non focal Skin: no rash   Pertinent Labs/Radiology:    Latest Ref Rng & Units 01/22/2022    4:05 AM 01/21/2022    5:00 PM 01/21/2022    2:58 AM  CBC  WBC 4.0 - 10.5 K/uL 22.1  25.4  20.3   Hemoglobin 13.0 - 17.0 g/dL 8.3  9.5  6.7   Hematocrit 39.0 - 52.0 % 25.4  27.9  20.4   Platelets 150 - 400 K/uL 156  168  155     Lab Results  Component Value Date   NA 135 01/22/2022   K 4.0 01/22/2022   CL 95 (L) 01/22/2022   CO2 22 01/22/2022     Assessment/Plan: SIRS due to acute gouty arthritis involving bilateral knees, numerous joints of left hand: Continue tapering steroids-all cultures negative so far-afebrile but continues to have persistent leukocytosis.  Appreciate ID input-continue to taper down prednisone.   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  Await further input from nephrology-we will defer further HD care to nephrology.  Hyperkalemia: Due to worsening renal  function-resolved with Lokelma and HD.  Continue to monitor periodically.  Oozing from tunneled HD catheter site: Evaluated by IR on 8/18-bleeding site sutured-no further bleeding evident.  A-fib with RVR: Triggered by uremia/acute illness-now  maintaining sinus rhythm on oral beta-blocker-Eliquis on hold due to ongoing lower GI bleeding.   Lower GI bleeding: Continues to have hematochezia-inspite of holding Eliquis-since continues to recur-and encephalopathy better-have consulted GI.  Continue to follow CBC and transfuse accordingly.    Normocytic anemia: Multifactorial etiology-due to acute illness/CKD/lower GI bleeding.  Transfused 2 units of PRBC on 8/20-hemoglobin relatively stable today but with ongoing hematochezia will need to be watched closely.   Acute metabolic encephalopathy: Due to uremia-improved today-continue to follow periodically-suspect further improvement with further HD sessions.  If in the unlikely event that he has persistent encephalopathy in spite of appropriate improvement in his kidney numbers-we will need neuroimaging.   Transaminitis: Unclear etiology-suspect probably medication related-no longer on cefepime and statin-Zytiga on hold.  LFTs downtrending-continue to follow.  RUQ ultrasound unremarkable.  We will continue to hold statin for a few more days-should be able to resume Zytiga.  No longer on cefepime.  Hyponatremia: Related to volume-improved with HD.  Hypoglycemia: Likely related to use of oral hypoglycemic agents in the setting of worsening renal function.  Continue to watch closely.  DM-2 (A1c 9.6 on 8/14) with uncontrolled hyperglycemia due to steroids: CBGs stable-steroids being tapered down-decrease Levemir to 24 units twice daily, decrease Premeal NovoLog to 6 units with meals-continue SSI and follow/optimize accordingly.   Will need to permanently stop glipizide and metformin on discharge.  Recent Labs    01/21/22 1616 01/21/22 2109 01/22/22 0757  GLUCAP 185* 195* 177*     Chronic left arm weakness: Per patient's son-this has been ongoing for the past several months-son has noted a problem with the patient's grip mostly.  MRI brain negative for acute CVA.  We will need to check a MRI  C-spine at some point.  Suspect some of it may be due to acute gouty arthritis involving his small hand joints as well.  Superficialization of left upper extremity AV fistula by vascular surgery on 8/8: With some residual arm swelling that seems to have worsened over the past few days-appreciate vascular surgery evaluation.  Prostate cancer: On prednisone/Zytiga-currently on higher doses of prednisone than usual-Zytiga held due to elevated LFTs but since they have improved-discussed with pharmacy team-Zytiga to be resumed.    Morbid Obesity: Estimated body mass index is 38.83 kg/m as calculated from the following:   Height as of this encounter: '5\' 11"'$  (1.803 m).   Weight as of this encounter: 126.3 kg.   Code status:   Code Status: Full Code   DVT Prophylaxis: Hold Eliquis due to ongoing hematochezia.   Family Communication: None at bedside-had spoken with son at length on 8/20.   Disposition Plan: Status is: Inpatient Remains inpatient appropriate because: Likely progression to ESRD-nephrology initiating dialysis-remains encephalopathic-not yet stable for discharge.   Planned Discharge Destination:SNF  Diet: Diet Order             Diet Carb Modified Fluid consistency: Thin; Room service appropriate? Yes with Assist  Diet effective now                     Antimicrobial agents: Anti-infectives (From admission, onward)    Start     Dose/Rate Route Frequency Ordered Stop   01/16/22 1000  ceFEPIme (MAXIPIME) 2 g in sodium chloride 0.9 % 100  mL IVPB  Status:  Discontinued        2 g 200 mL/hr over 30 Minutes Intravenous Every 24 hours 01/15/22 1014 01/17/22 1355   01/15/22 1015  ceFEPIme (MAXIPIME) 2 g in sodium chloride 0.9 % 100 mL IVPB        2 g 200 mL/hr over 30 Minutes Intravenous  Once 01/15/22 1005 01/15/22 1127   01/15/22 1015  metroNIDAZOLE (FLAGYL) IVPB 500 mg        500 mg 100 mL/hr over 60 Minutes Intravenous  Once 01/15/22 1005 01/15/22 1213   01/15/22  1015  vancomycin (VANCOCIN) IVPB 1000 mg/200 mL premix  Status:  Discontinued        1,000 mg 200 mL/hr over 60 Minutes Intravenous  Once 01/15/22 1005 01/15/22 1013   01/15/22 1015  vancomycin (VANCOREADY) IVPB 2000 mg/400 mL        2,000 mg 200 mL/hr over 120 Minutes Intravenous  Once 01/15/22 1013 01/15/22 1325   01/15/22 1011  vancomycin variable dose per unstable renal function (pharmacist dosing)  Status:  Discontinued         Does not apply See admin instructions 01/15/22 1011 01/18/22 1526        MEDICATIONS: Scheduled Meds:  sodium chloride   Intravenous Once   sodium chloride   Intravenous Once   acetaminophen  650 mg Oral Once   calcitRIOL  0.25 mcg Oral Daily   carvedilol  25 mg Oral BID WC   Chlorhexidine Gluconate Cloth  6 each Topical Q0600   diphenhydrAMINE  25 mg Intravenous Once   feeding supplement (NEPRO CARB STEADY)  237 mL Oral BID BM   insulin aspart  0-9 Units Subcutaneous TID WC   insulin aspart  6 Units Subcutaneous TID WC   insulin detemir  24 Units Subcutaneous BID   multivitamin  1 tablet Oral QHS   pantoprazole  40 mg Oral BID   predniSONE  20 mg Oral Q breakfast   sodium chloride flush  10-40 mL Intracatheter Q12H   sodium chloride flush  3 mL Intravenous Q12H   sodium chloride flush  3 mL Intravenous Q12H   Continuous Infusions:  sodium chloride     sodium chloride     anticoagulant sodium citrate     PRN Meds:.sodium chloride, sodium chloride, acetaminophen, hydrALAZINE, HYDROmorphone (DILAUDID) injection, lidocaine (PF), lidocaine-prilocaine, metoprolol tartrate, ondansetron (ZOFRAN) IV, pentafluoroprop-tetrafluoroeth, sodium chloride, sodium chloride flush, sodium chloride flush, sodium chloride flush   I have personally reviewed following labs and imaging studies  LABORATORY DATA: CBC: Recent Labs  Lab 01/17/22 0441 01/18/22 0339 01/19/22 0321 01/20/22 0343 01/21/22 0258 01/21/22 1700 01/22/22 0405  WBC 15.3*   < > 20.1* 20.4*  20.3* 25.4* 22.1*  NEUTROABS 14.0*  --   --   --   --   --   --   HGB 8.1*   < > 8.0* 7.2* 6.7* 9.5* 8.3*  HCT 25.9*   < > 24.7* 22.0* 20.4* 27.9* 25.4*  MCV 92.8   < > 88.8 88.0 88.3 83.5 86.1  PLT 163   < > 179 167 155 168 156   < > = values in this interval not displayed.     Basic Metabolic Panel: Recent Labs  Lab 01/15/22 1323 01/16/22 0410 01/17/22 0441 01/17/22 1005 01/18/22 0339 01/19/22 0321 01/20/22 0343 01/20/22 0820 01/21/22 0258 01/22/22 0405  NA  --    < > 132* 129* 129* 130* 133* 133* 130* 135  K  --    < >  5.8* 5.4* 4.8 3.9 4.5 4.2 3.9 4.0  CL  --    < > 95* 93* 93* 92* 93* 95* 92* 95*  CO2  --    < > 18* 16* 17* 19* 20* 19* 22 22  GLUCOSE  --    < > 367* 360* 360* 255* 178* 159* 233* 181*  BUN  --    < > 145* 149* 170* 143* 177* 177* 140* 110*  CREATININE  --    < > 8.49* 8.64* 9.54* 7.97* 10.06* 9.55* 8.00* 6.83*  CALCIUM  --    < > 7.4* 7.5* 7.1* 7.6* 8.3* 7.9* 7.8* 8.1*  MG 1.9  --  2.4  --   --   --   --   --   --   --   PHOS 4.9*  --  9.7* 9.7* 10.1*  --   --  10.2*  --  7.7*   < > = values in this interval not displayed.     GFR: Estimated Creatinine Clearance: 14 mL/min (A) (by C-G formula based on SCr of 6.83 mg/dL (H)).  Liver Function Tests: Recent Labs  Lab 01/17/22 0441 01/17/22 1005 01/18/22 1042 01/19/22 0321 01/20/22 0343 01/20/22 0820 01/21/22 0258 01/22/22 0405  AST 510*  --  456* 345* 149*  --  75*  --   ALT 123*  --  132* 129* 95*  --  72*  --   ALKPHOS 133*  --  125 133* 94  --  83  --   BILITOT 1.3*  --  0.7 0.9 0.8  --  1.0  --   PROT 6.4*  --  6.0* 6.1* 5.8*  --  5.5*  --   ALBUMIN 1.7*   < > 1.6* 1.6* 1.6* 1.6* 1.6* 1.6*   < > = values in this interval not displayed.    No results for input(s): "LIPASE", "AMYLASE" in the last 168 hours. No results for input(s): "AMMONIA" in the last 168 hours.   Coagulation Profile: No results for input(s): "INR", "PROTIME" in the last 168 hours.   Cardiac Enzymes: No  results for input(s): "CKTOTAL", "CKMB", "CKMBINDEX", "TROPONINI" in the last 168 hours.  BNP (last 3 results) No results for input(s): "PROBNP" in the last 8760 hours.  Lipid Profile: No results for input(s): "CHOL", "HDL", "LDLCALC", "TRIG", "CHOLHDL", "LDLDIRECT" in the last 72 hours.  Thyroid Function Tests: No results for input(s): "TSH", "T4TOTAL", "FREET4", "T3FREE", "THYROIDAB" in the last 72 hours.   Anemia Panel: No results for input(s): "VITAMINB12", "FOLATE", "FERRITIN", "TIBC", "IRON", "RETICCTPCT" in the last 72 hours.   Urine analysis:    Component Value Date/Time   COLORURINE YELLOW 05/04/2008 2129   APPEARANCEUR Cloudy (A) 08/19/2015 1403   LABSPEC 1.012 05/04/2008 2129   PHURINE 5.5 05/04/2008 2129   GLUCOSEU Negative 08/19/2015 1403   GLUCOSEU 500 (A) 05/04/2008 2129   HGBUR SMALL (A) 04/22/2008 0848   BILIRUBINUR Negative 08/19/2015 1403   KETONESUR NEG mg/dL 05/04/2008 2129   PROTEINUR 1+ (A) 08/19/2015 1403   PROTEINUR 30 (A) 05/04/2008 2129   UROBILINOGEN 0.2 05/04/2008 2129   NITRITE Negative 08/19/2015 1403   NITRITE NEG 05/04/2008 2129   LEUKOCYTESUR 1+ (A) 08/19/2015 1403    Sepsis Labs: Lactic Acid, Venous    Component Value Date/Time   LATICACIDVEN 2.6 (HH) 01/15/2022 1026    MICROBIOLOGY: Recent Results (from the past 240 hour(s))  Blood Culture (routine x 2)     Status: None   Collection Time:  01/15/22 10:04 AM   Specimen: BLOOD RIGHT FOREARM  Result Value Ref Range Status   Specimen Description BLOOD RIGHT FOREARM  Final   Special Requests   Final    BOTTLES DRAWN AEROBIC AND ANAEROBIC Blood Culture adequate volume   Culture   Final    NO GROWTH 5 DAYS Performed at [facility], [address] [place], [place] [address]    Report Status 01/20/2022 FINAL  Final  Resp Panel by RT-PCR (Flu A&B, Covid) Anterior Nasal Swab     Status: None   Collection Time: 01/15/22 10:05 AM   Specimen: Anterior Nasal Swab  Result Value  Ref Range Status   SARS Coronavirus 2 by RT PCR NEGATIVE NEGATIVE Final    Comment: (NOTE) SARS-CoV-2 target nucleic acids are NOT DETECTED.  The SARS-CoV-2 RNA is generally detectable in upper respiratory specimens during the acute phase of infection. The lowest concentration of SARS-CoV-2 viral copies this assay can detect is 138 copies/mL. A negative result does not preclude SARS-Cov-2 infection and should not be used as the sole basis for treatment or other patient management decisions. A negative result may occur with  improper specimen collection/handling, submission of specimen other than nasopharyngeal swab, presence of viral mutation(s) within the areas targeted by this assay, and inadequate number of viral copies(<138 copies/mL). A negative result must be combined with clinical observations, patient history, and epidemiological information. The expected result is Negative.  Fact Sheet for Patients:  EntrepreneurPulse.com.au  Fact Sheet for Healthcare Providers:  IncredibleEmployment.be  This test is no t yet approved or cleared by the [place] FDA and  has been authorized for detection and/or diagnosis of SARS-CoV-2 by FDA under an Emergency Use Authorization (EUA). This EUA will remain  in effect (meaning this test can be used) for the duration of the COVID-19 declaration under Section 564(b)(1) of the Act, 21 U.S.C.section 360bbb-3(b)(1), unless the authorization is terminated  or revoked sooner.       Influenza A by PCR NEGATIVE NEGATIVE Final   Influenza B by PCR NEGATIVE NEGATIVE Final    Comment: (NOTE) The Xpert Xpress SARS-CoV-2/FLU/RSV plus assay is intended as an aid in the diagnosis of influenza from Nasopharyngeal swab specimens and should not be used as a sole basis for treatment. Nasal washings and aspirates are unacceptable for Xpert Xpress SARS-CoV-2/FLU/RSV testing.  Fact Sheet for  Patients: EntrepreneurPulse.com.au  Fact Sheet for Healthcare Providers: IncredibleEmployment.be  This test is not yet approved or cleared by the [place] FDA and has been authorized for detection and/or diagnosis of SARS-CoV-2 by FDA under an Emergency Use Authorization (EUA). This EUA will remain in effect (meaning this test can be used) for the duration of the COVID-19 declaration under Section 564(b)(1) of the Act, 21 U.S.C. section 360bbb-3(b)(1), unless the authorization is terminated or revoked.  Performed at [facility], [place] [address], [place], [place] [address]   Blood Culture (routine x 2)     Status: None   Collection Time: 01/15/22 10:09 AM   Specimen: BLOOD  Result Value Ref Range Status   Specimen Description BLOOD SITE NOT SPECIFIED  Final   Special Requests   Final    BOTTLES DRAWN AEROBIC AND ANAEROBIC Blood Culture adequate volume   Culture   Final    NO GROWTH 5 DAYS Performed at [facility], [address], [place], [place] [address]    Report Status 01/20/2022 FINAL  Final  MRSA Next Gen by PCR, Nasal  Status: None   Collection Time: 01/15/22  1:28 PM   Specimen: Nasal Mucosa; Nasal Swab  Result Value Ref Range Status   MRSA by PCR Next Gen NOT DETECTED NOT DETECTED Final    Comment: (NOTE) The GeneXpert MRSA Assay (FDA approved for NASAL specimens only), is one component of a comprehensive MRSA colonization surveillance program. It is not intended to diagnose MRSA infection nor to guide or monitor treatment for MRSA infections. Test performance is not FDA approved in patients less than 4 years old. Performed at [facility], [place] [address], [place], [place] [address]   Body fluid culture w Gram Stain     Status: None   Collection Time: 01/15/22  5:59 PM   Specimen: Synovial Fluid  Result Value Ref Range Status   Specimen Description FLUID SYNOVIAL KNEE LEFT  Final   Special Requests  NONE  Final   Gram Stain   Final    ABUNDANT WBC PRESENT, PREDOMINANTLY PMN NO ORGANISMS SEEN    Culture   Final    NO GROWTH 3 DAYS Performed at [facility], [place] [address], [place], [place] [address]    Report Status 01/18/2022 FINAL  Final  Anaerobic culture w Gram Stain     Status: None   Collection Time: 01/15/22  5:59 PM   Specimen: Synovial Fluid  Result Value Ref Range Status   Specimen Description FLUID SYNOVIAL KNEE LEFT  Final   Special Requests NONE  Final   Gram Stain   Final    ABUNDANT WBC PRESENT, PREDOMINANTLY PMN NO ORGANISMS SEEN    Culture   Final    NO ANAEROBES ISOLATED Performed at [facility], [place] [address], [place], [place] [address]    Report Status 01/20/2022 FINAL  Final  Body fluid culture w Gram Stain     Status: None   Collection Time: 01/16/22 11:48 AM   Specimen: Synovium; Body Fluid  Result Value Ref Range Status   Specimen Description SYNOVIAL  Final   Special Requests  RIGHT KNEE  Final   Gram Stain   Final    ABUNDANT WBC PRESENT, PREDOMINANTLY PMN NO ORGANISMS SEEN    Culture   Final    NO GROWTH 3 DAYS Performed at [facility], [address] [place], [place] [address]    Report Status 01/19/2022 FINAL  Final    RADIOLOGY STUDIES/RESULTS: No results found.   LOS: 7 days   Oren Binet, MD  [facility]    To contact the attending provider between 7A-7P or the covering provider during after hours 7P-7A, please log into the web site [email or web address] and access using universal [place] password for that web site. If you do not have the password, please call the hospital operator.  01/22/2022, 11:17 AM

## 2022-01-23 DIAGNOSIS — K921 Melena: Secondary | ICD-10-CM | POA: Diagnosis not present

## 2022-01-23 DIAGNOSIS — G9340 Encephalopathy, unspecified: Secondary | ICD-10-CM | POA: Diagnosis not present

## 2022-01-23 DIAGNOSIS — D62 Acute posthemorrhagic anemia: Secondary | ICD-10-CM

## 2022-01-23 DIAGNOSIS — R4182 Altered mental status, unspecified: Secondary | ICD-10-CM | POA: Diagnosis not present

## 2022-01-23 DIAGNOSIS — Z7901 Long term (current) use of anticoagulants: Secondary | ICD-10-CM

## 2022-01-23 DIAGNOSIS — G9341 Metabolic encephalopathy: Secondary | ICD-10-CM | POA: Diagnosis not present

## 2022-01-23 DIAGNOSIS — A419 Sepsis, unspecified organism: Secondary | ICD-10-CM | POA: Diagnosis not present

## 2022-01-23 LAB — CBC
HCT: 26 % — ABNORMAL LOW (ref 39.0–52.0)
HCT: 26.1 % — ABNORMAL LOW (ref 39.0–52.0)
Hemoglobin: 8.7 g/dL — ABNORMAL LOW (ref 13.0–17.0)
Hemoglobin: 8.6 g/dL — ABNORMAL LOW (ref 13.0–17.0)
MCH: 28.8 pg (ref 26.0–34.0)
MCH: 28.5 pg (ref 26.0–34.0)
MCHC: 33.5 g/dL (ref 30.0–36.0)
MCHC: 33 g/dL (ref 30.0–36.0)
MCV: 86.1 fL (ref 80.0–100.0)
MCV: 86.4 fL (ref 80.0–100.0)
Platelets: 196 10*3/uL (ref 150–400)
Platelets: 217 10*3/uL (ref 150–400)
RBC: 3.02 MIL/uL — ABNORMAL LOW (ref 4.22–5.81)
RBC: 3.02 MIL/uL — ABNORMAL LOW (ref 4.22–5.81)
RDW: 16.1 % — ABNORMAL HIGH (ref 11.5–15.5)
RDW: 15.9 % — ABNORMAL HIGH (ref 11.5–15.5)
WBC: 19.2 10*3/uL — ABNORMAL HIGH (ref 4.0–10.5)
WBC: 18.5 10*3/uL — ABNORMAL HIGH (ref 4.0–10.5)
nRBC: 0 % (ref 0.0–0.2)
nRBC: 0 % (ref 0.0–0.2)

## 2022-01-23 LAB — COMPREHENSIVE METABOLIC PANEL
ALT: 48 U/L — ABNORMAL HIGH (ref 0–44)
AST: 35 U/L (ref 15–41)
Albumin: 1.7 g/dL — ABNORMAL LOW (ref 3.5–5.0)
Alkaline Phosphatase: 81 U/L (ref 38–126)
Anion gap: 19 — ABNORMAL HIGH (ref 5–15)
BUN: 140 mg/dL — ABNORMAL HIGH (ref 8–23)
CO2: 22 mmol/L (ref 22–32)
Calcium: 8.5 mg/dL — ABNORMAL LOW (ref 8.9–10.3)
Chloride: 92 mmol/L — ABNORMAL LOW (ref 98–111)
Creatinine, Ser: 7.77 mg/dL — ABNORMAL HIGH (ref 0.61–1.24)
GFR, Estimated: 7 mL/min — ABNORMAL LOW (ref >60)
Glucose, Bld: 304 mg/dL — ABNORMAL HIGH (ref 70–99)
Potassium: 4.2 mmol/L (ref 3.5–5.1)
Sodium: 133 mmol/L — ABNORMAL LOW (ref 135–145)
Total Bilirubin: 0.6 mg/dL (ref 0.3–1.2)
Total Protein: 5.9 g/dL — ABNORMAL LOW (ref 6.5–8.1)

## 2022-01-23 LAB — GLUCOSE, CAPILLARY
Glucose-Capillary: 284 mg/dL — ABNORMAL HIGH (ref 70–99)
Glucose-Capillary: 340 mg/dL — ABNORMAL HIGH (ref 70–99)
Glucose-Capillary: 157 mg/dL — ABNORMAL HIGH (ref 70–99)
Glucose-Capillary: 206 mg/dL — ABNORMAL HIGH (ref 70–99)

## 2022-01-23 MED ORDER — INSULIN DETEMIR 100 UNIT/ML ~~LOC~~ SOLN
30.0000 [IU] | Freq: Two times a day (BID) | SUBCUTANEOUS | Status: DC
  Administered 2022-01-23 – 2022-01-24 (×3): 30 [IU] via SUBCUTANEOUS
  Filled 2022-01-23 (×5): qty 0.3

## 2022-01-23 MED ORDER — HEPARIN SODIUM (PORCINE) 1000 UNIT/ML IJ SOLN
INTRAMUSCULAR | Status: AC
  Administered 2022-01-23: 3800 [IU]
  Filled 2022-01-23: qty 3

## 2022-01-23 MED ORDER — INSULIN ASPART 100 UNIT/ML IJ SOLN
10.0000 [IU] | Freq: Three times a day (TID) | INTRAMUSCULAR | Status: DC
  Administered 2022-01-23 (×3): 10 [IU] via SUBCUTANEOUS

## 2022-01-23 MED ORDER — PREDNISONE 5 MG PO TABS
10.0000 mg | ORAL_TABLET | Freq: Every day | ORAL | Status: DC
  Administered 2022-01-23 – 2022-01-26 (×4): 10 mg via ORAL
  Filled 2022-01-23 (×4): qty 2

## 2022-01-23 NOTE — Progress Notes (Signed)
Physical Therapy Treatment Patient Details Name: [name] MRN: [id number] DOB: [birth year] Today's Date: 01/23/2022   History of Present Illness 66 yo male presents to ED on 8/14 with hypoglycemia, AMS, recent fall, POD6 L AV fistula placement. CTH negative, FOBT +. s/p R and L knee aspiration on 8/14 and 8/15, respectively. Workup for SIRS due to acute gouty arthritis bilat knees. PMH includes CKD stage V, DM-2 with retinopathy and neuropathy, chronic HFpEF, HTN, HLD, prostate cancer, cardiomyopathy.    PT Comments    Pt with improving processing time and awareness during session today, per son pt appears cognitively clearer today vs this weekend. Pt complaining of significant gouty-type pain in bilat knees L>R, tolerated LE exercises, bed mobility, and attempted stand x2 well but requires significant physical assist +2. D/c plan for SNF remains appropriate, PT to continue to follow.     Recommendations for follow up therapy are one component of a multi-disciplinary discharge planning process, led by the attending physician.  Recommendations may be updated based on patient status, additional functional criteria and insurance authorization.  Follow Up Recommendations  Skilled nursing-short term rehab (<3 hours/day) Can patient physically be transported by private vehicle: No   Assistance Recommended at Discharge Frequent or constant Supervision/Assistance  Patient can return home with the following Two people to help with walking and/or transfers;A lot of help with bathing/dressing/bathroom;Assistance with cooking/housework;Help with stairs or ramp for entrance;Assist for transportation   Equipment Recommendations  Other (comment) (tbd)    Recommendations for Other Services       Precautions / Restrictions Precautions Precautions: Fall Precaution Comments: R IJ catheter Restrictions Weight Bearing Restrictions: No     Mobility  Bed Mobility Overal bed mobility: Needs  Assistance Bed Mobility: Supine to Sit, Rolling, Sit to Supine Rolling: Max assist, +2 for physical assistance, +2 for safety/equipment   Supine to sit: Max assist, +2 for physical assistance, +2 for safety/equipment Sit to supine: Max assist, +2 for physical assistance, +2 for safety/equipment   General bed mobility comments: assist for trunk and LE management to/from EOB, very increased time and sequencing cuing    Transfers Overall transfer level: Needs assistance Equipment used: Ambulation equipment used Transfers: Sit to/from Stand Sit to Stand: Total assist, +2 physical assistance           General transfer comment: Attmepted to complete with total/max x2 with steady but unable to complete beyond >2 inches buttocks clearing    Ambulation/Gait                   Stairs             Wheelchair Mobility    Modified Rankin (Stroke Patients Only)       Balance Overall balance assessment: Needs assistance Sitting-balance support: Feet supported, Single extremity supported Sitting balance-Leahy Scale: Fair Sitting balance - Comments: Pt required min-mod but then supervision Postural control: Posterior lean                                  Cognition Arousal/Alertness: Awake/alert Behavior During Therapy: WFL for tasks assessed/performed Overall Cognitive Status: Impaired/Different from baseline Area of Impairment: Safety/judgement, Awareness, Problem solving, Following commands                     Memory: Decreased short-term memory Following Commands: Follows one step commands with increased time Safety/Judgement: Decreased awareness of safety, Decreased awareness of deficits  Problem Solving: Slow processing, Requires verbal cues, Requires tactile cues General Comments: Pt noted to increase in level of awareness in today's session but could not recall the last 2-3 days of events        Exercises General Exercises - Lower  Extremity Long Arc Quad: AAROM, Both, 15 reps, Seated    General Comments General comments (skin integrity, edema, etc.): burst blister to R scapula, mepilex applied. PT removed restricted extremity band as it was leaving a mark on pt skin, RN notified      Pertinent Vitals/Pain Pain Assessment Pain Assessment: Faces Faces Pain Scale: Hurts whole lot Pain Location: B knees/ L arm Pain Descriptors / Indicators: Sore, Discomfort, Grimacing, Guarding Pain Intervention(s): Limited activity within patient's tolerance, Monitored during session, Repositioned    Home Living                          Prior Function            PT Goals (current goals can now be found in the care plan section) Acute Rehab PT Goals Patient Stated Goal: return to PLOF PT Goal Formulation: With patient/family Time For Goal Achievement: 01/31/22 Potential to Achieve Goals: Good Progress towards PT goals: Progressing toward goals    Frequency    Min 2X/week      PT Plan Current plan remains appropriate    Co-evaluation PT/OT/SLP Co-Evaluation/Treatment: Yes Reason for Co-Treatment: For patient/therapist safety;To address functional/ADL transfers PT goals addressed during session: Balance;Mobility/safety with mobility OT goals addressed during session: ADL's and self-care      AM-PAC PT "6 Clicks" Mobility   Outcome Measure  Help needed turning from your back to your side while in a flat bed without using bedrails?: A Lot Help needed moving from lying on your back to sitting on the side of a flat bed without using bedrails?: A Lot Help needed moving to and from a bed to a chair (including a wheelchair)?: Total Help needed standing up from a chair using your arms (e.g., wheelchair or bedside chair)?: Total Help needed to walk in hospital room?: Total Help needed climbing 3-5 steps with a railing? : Total 6 Click Score: 8    End of Session Equipment Utilized During Treatment: Gait  belt Activity Tolerance: Patient tolerated treatment well Patient left: with call bell/phone within reach;in bed;with bed alarm set;with family/visitor present Nurse Communication: Mobility status PT Visit Diagnosis: Other abnormalities of gait and mobility (R26.89)     Time: 1030-1106 PT Time Calculation (min) (ACUTE ONLY): 36 min  Charges:  $Therapeutic Activity: 8-22 mins                    Stacie Glaze, PT DPT Acute Rehabilitation Services Pager [phone]  Office [phone]    [place] 01/23/2022, 1:20 PM

## 2022-01-23 NOTE — Progress Notes (Signed)
Working with [employer] admissions and [facility] SW in an effort to obtain pt a MWF chair at clinic since pt admitting to snf at d/c. Awaiting final acceptance/schedule from clinic.   Melven Sartorius Renal Navigator [phone]

## 2022-01-23 NOTE — Procedures (Signed)
I was present at this dialysis session. I have reviewed the session itself and made appropriate changes.   K 4.2 on 2K Bath.  Hb 8s stable, GI notes reviewed.  UF goal 3L.  No c/o at this time. Still using Temp HD cath WBC 19 but afebrile.   Filed Weights   01/22/22 0500 01/23/22 0500 01/23/22 1236  Weight: 126.3 kg 124.6 kg 123.5 kg    Recent Labs  Lab 01/22/22 0405 01/23/22 0450  NA 135 133*  K 4.0 4.2  CL 95* 92*  CO2 22 22  GLUCOSE 181* 304*  BUN 110* 140*  CREATININE 6.83* 7.77*  CALCIUM 8.1* 8.5*  PHOS 7.7*  --     Recent Labs  Lab 01/17/22 0441 01/18/22 0339 01/22/22 0405 01/22/22 1643 01/23/22 0450  WBC 15.3*   < > 22.1* 19.7* 19.2*  NEUTROABS 14.0*  --   --   --   --   HGB 8.1*   < > 8.3* 8.3* 8.7*  HCT 25.9*   < > 25.4* 26.0* 26.0*  MCV 92.8   < > 86.1 87.2 86.1  PLT 163   < > 156 168 196   < > = values in this interval not displayed.    Scheduled Meds:  sodium chloride   Intravenous Once   sodium chloride   Intravenous Once   abiraterone acetate  1,000 mg Oral Daily   acetaminophen  650 mg Oral Once   calcitRIOL  0.25 mcg Oral Daily   carvedilol  25 mg Oral BID WC   Chlorhexidine Gluconate Cloth  6 each Topical Q0600   diphenhydrAMINE  25 mg Intravenous Once   feeding supplement (NEPRO CARB STEADY)  237 mL Oral BID BM   insulin aspart  0-9 Units Subcutaneous TID WC   insulin aspart  10 Units Subcutaneous TID WC   insulin detemir  30 Units Subcutaneous BID   multivitamin  1 tablet Oral QHS   pantoprazole  40 mg Oral BID   predniSONE  10 mg Oral Q breakfast   sodium chloride flush  10-40 mL Intracatheter Q12H   sodium chloride flush  3 mL Intravenous Q12H   sodium chloride flush  3 mL Intravenous Q12H   Continuous Infusions:  sodium chloride     sodium chloride     anticoagulant sodium citrate     PRN Meds:.sodium chloride, sodium chloride, acetaminophen, hydrALAZINE, HYDROmorphone (DILAUDID) injection, lidocaine (PF), lidocaine-prilocaine,  metoprolol tartrate, ondansetron (ZOFRAN) IV, pentafluoroprop-tetrafluoroeth, sodium chloride, sodium chloride flush, sodium chloride flush, sodium chloride flush   Pearson Grippe  MD 01/23/2022, 1:35 PM

## 2022-01-23 NOTE — Progress Notes (Addendum)
Progress Note   Subjective  Chief Complaint: GI bleed  Today, patient's son tells me that he is not sure if patient has had any further hematochezia, per nursing staff none since 2 nights ago now.  Patient is still unable to ambulate his PT was just in the room.  Son is concerned that he will not be able to successfully do bowel prep without it being a very large mass.   Objective   Vital signs in last 24 hours: Temp:  [97.8 F (36.6 C)-99.1 F (37.3 C)] 98.1 F (36.7 C) (08/22 1120) Pulse Rate:  [73-83] 74 (08/22 1120) Resp:  [14-23] 19 (08/22 1120) BP: (137-156)/(53-73) 137/53 (08/22 1120) SpO2:  [95 %-98 %] 95 % (08/22 1120) Weight:  [124.6 kg] 124.6 kg (08/22 0500) Last BM Date : 01/22/22 General:   AA male in NAD Heart:  Regular rate and rhythm; no murmurs Lungs: Respirations even and unlabored, lungs CTA bilaterally Abdomen:  Soft, nontender and nondistended. Normal bowel sounds. Psych:  Cooperative. Normal mood and affect.  Intake/Output from previous day: 08/21 0701 - 08/22 0700 In: 974 [P.O.:737; NG/GT:237] Out: 300 [Urine:300]   Lab Results: Recent Labs    01/22/22 0405 01/22/22 1643 01/23/22 0450  WBC 22.1* 19.7* 19.2*  HGB 8.3* 8.3* 8.7*  HCT 25.4* 26.0* 26.0*  PLT 156 168 196   BMET Recent Labs    01/21/22 0258 01/22/22 0405 01/23/22 0450  NA 130* 135 133*  K 3.9 4.0 4.2  CL 92* 95* 92*  CO2 '22 22 22  '$ GLUCOSE 233* 181* 304*  BUN 140* 110* 140*  CREATININE 8.00* 6.83* 7.77*  CALCIUM 7.8* 8.1* 8.5*   LFT Recent Labs    01/23/22 0450  PROT 5.9*  ALBUMIN 1.7*  AST 35  ALT 48*  ALKPHOS 81  BILITOT 0.6      Assessment / Plan:   Assessment: 1.  Hematochezia: First episode on 01/21/2022, 1 further episode 01/23/2019 3 in the morning, none since, Eliquis currently on hold, last colonoscopy in 2009, repeat recommended 5 years due to tubular adenomas, Hgb stable after 3 units PRBCs, currently 8.7 with no further hematochezia; consider  lower GI bleed most likely exacerbated by blood thinner 2.  SIRS due to acute gouty arthritis 3.  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  A-fib with RVR: Thought to record by acute illness, now maintaining normal sinus rhythm, Eliquis on hold due to GI bleed 5.  Transaminitis: Improving, likely med induced 6.  Morbid obesity 7.  Prostate cancer  Plan: 1.  We have discussed EGD and colonoscopy for further evaluation of recent bleeding and anemia.  Patient is still unable to ambulate making prepping for procedures very difficult.  Since patient has had no further bleeding and hemoglobin remained stable there is no emergent need for these procedures.  Family would like to wait until it would be easier for patient. 2.  For now would recommend continuing to monitor Hgb and transfuse if less than 7-8 3.  Continue Pantoprazole 40 twice daily 4.  Will return patient to renal diet  We are going to sign off for now.  Certainly if patient has any further acute GI bleeding we will need to consider these procedures sooner, otherwise please call us when patient is able to ambulate.   LOS: 8 days   Levin Erp  01/23/2022, 12:11 PM   Attending Physician Note   I have taken an interval history,  reviewed the chart and examined the patient. I performed a substantive portion of this encounter, including complete performance of at least one of the key components, in conjunction with the APP. I agree with the APP's note, impression and recommendations with my edits. My additional impressions and recommendations are as follows.   *Hematochezia, resolving. Colonoscopy +/- EGD when he is able to stand, ambulate at least in the room for bowel prep.   *ABL anemia. Trend CBC.   *Afib. Continue to hold Eliquis for at least 5 more days and if no bleeding can resume. Will hold again for colonoscopy.   *Gouty arthritis with SIRS. Severe bilateral knee pain, unable to stand or  ambulate at this time.   Please reconsult GI when he is able to stand and ambulate, at least in the room, or if active rebleeding occurs.   Lucio Edward, MD [facility] See [facility], [facility], for our on call provider

## 2022-01-23 NOTE — Progress Notes (Signed)
Received patient in bed to unit. Alert and oriented. Informed consent signed and in chart.   Treatment initiated:1245 Treatment completed: 1551  Patient tolerated well. Transported back to the room Alert, without acute distress. Report to patient's floor nurse.   Access used: Right HD catheter Access issues: none   Total UF removed: 2000 Medication(s) given: none Post HD VS: 107/77 72 96% 20 98.5 Post HD weight: 121.6kg    [facility]

## 2022-01-23 NOTE — Plan of Care (Signed)
  Problem: Education: Goal: Ability to describe self-care measures that may prevent or decrease complications (Diabetes Survival Skills Education) will improve Outcome: Progressing   Problem: Coping: Goal: Ability to adjust to condition or change in health will improve Outcome: Progressing   Problem: Fluid Volume: Goal: Ability to maintain a balanced intake and output will improve Outcome: Progressing   Problem: Health Behavior/Discharge Planning: Goal: Ability to manage health-related needs will improve Outcome: Progressing   Problem: Metabolic: Goal: Ability to maintain appropriate glucose levels will improve Outcome: Progressing

## 2022-01-23 NOTE — Progress Notes (Incomplete)
[facility] Infectious Diseases Follow Up Note  Patient Identification: Patient Name: [name] MRN: [id number] Admit Date: 01/15/2022  8:51 AM Age: 66 y.o.Today's Date: 01/23/2022  Reason for Visit: leukocytosis   Principal Problem:   Sepsis ([place]) Active Problems:   Obesity (BMI 30-39.9)   Essential hypertension   Anemia   AKI (acute kidney injury) ([place])   CKD (chronic kidney disease) stage 5, GFR less than 15 ml/min (HCC)   Swelling of left hand   SIRS (systemic inflammatory response syndrome) (HCC)   Acute metabolic encephalopathy   Hypoglycemia associated with type 2 diabetes mellitus (HCC)   Gout flare   Left knee pain   Current chronic use of systemic steroids   Prostate cancer (HCC)   Left arm weakness   Abnormal CXR   Paroxysmal atrial fibrillation (HCC)   Antibiotics:  Vancomycin 8/14 Cefepime 8/14-8/16 Metronidazole 8/14  Lines/Hardwares: LUA AV graft, Rt IH HD Catheter  Interval Events: remains afebrile off abtx, WBC has been downtrending ( also on steroids)  Assessment Leukocytosis: in the setting of # 2 and steroids Gouty arthritis: less likely to be a septic arthritis. 8/14 Left knee cx NG ( WBC 23, 835, N 96%, EX monosodium urate crytsals), 8/15 Rt knee Cx NG WBC 15, 600 N 88%, intracellular monosodium urate crystals ( received bs abtx on 8/14). On tapering dose of prednisone. Exam of bilateral knee joints off abtx since 8/16  with no redness/warmth but somewhat restricted due to pain. CKD>>ESRD.- Nephrology following DM2  Acute anemia in the setting of Lower GIB - GI consulted  Acute metabolic Encephalopathy - follows commands appropriately and seems to have some improvement Prostate ca - previously on zytiga/prednisone, zytiga on hold due to transaminitis    Recommendations Management of gouty arthritis per primary. Expect leukocytosis to downtrend with tapering prednisone  ID available as  needed, please recall if needed.   Rest of the management as per the primary team. Thank you for the consult. Please page with pertinent questions or concerns.  ______________________________________________________________________ Subjective patient seen and examined at the bedside.  Feels cold, his bilateral knees and fingers in hand hurt   Past Medical History:  Diagnosis Date   Arthritis    Cardiomyopathy    Resolved with EF 55% 2011   CARDIOMYOPATHY 04/22/2006   CARDIOMYOPATHY 04/22/2006   2- D Echo (04/2010) : The EF is probably 55% with some hypokinesis at the base of the inferior wall. Wall thickness was increased in a pattern of mild LVH. Patient followed by Dr Martinique (Cardiology)      CHF (congestive heart failure) ([place])    Chronic kidney disease    Diabetes mellitus    DIABETIC  RETINOPATHY 02/06/2007   GERD (gastroesophageal reflux disease)    Hyperlipemia    Hypertension    Iron deficiency 04/09/2018   NEPHROPATHY, DIABETIC 06/08/2006   Paroxysmal atrial tachycardia ([place])    Past Surgical History:  Procedure Laterality Date   AV FISTULA PLACEMENT Left 06/06/2021   Procedure: LEFT BRACHIOCEPHALIC ARTERIOVENOUS (AV) FISTULA CREATION;  Surgeon: Broadus John, MD;  Location: [facility];  Service: Vascular;  Laterality: Left;  PERIPHERAL NERVE BLOCK   CARDIAC CATHETERIZATION  Dec 2007   normal; EF- 20-25%   FISTULA SUPERFICIALIZATION Left 01/09/2022   Procedure: LEFT BRACHIOCEPHALIC FISTULA SUPERFICIALIZATION;  Surgeon: Broadus John, MD;  Location: [facility];  Service: Vascular;  Laterality: Left;  PERIPHERAL NERVE BLOCK   IR FLUORO GUIDE CV LINE RIGHT  01/18/2022   IR  US GUIDE VASC ACCESS RIGHT  01/18/2022    Vitals BP (!) 156/58 (BP Location: Right Wrist)   Pulse 77   Temp 97.8 F (36.6 C) (Oral)   Resp 16   Ht 5' 11"  (1.803 m)   Wt 124.6 kg   SpO2 97%   BMI 38.31 kg/m   Physical Exam Constitutional:  lying in the bed and covered with a blanket     Comments:    Cardiovascular:     Rate and Rhythm: Normal rate and regular rhythm.     Heart sounds:  Pulmonary:     Effort: Pulmonary effort is normal on room air     Comments:   Abdominal:     Palpations: Abdomen is soft.     Tenderness: non distended and non tender   Musculoskeletal:        General: Bilateral knees with no warmth/tenderness/swelling however ROM restricted due to pain. No signs of septic joint in other peripheral joints.   Skin:    Comments: RT IJ HD catheter, Left UE AVF  Neurological:     General: awake, alert and oriented, follows commands   Psychiatric:        Mood and Affect: Mood normal.   Pertinent Microbiology Results for orders placed or performed during the hospital encounter of 01/15/22  Blood Culture (routine x 2)     Status: None   Collection Time: 01/15/22 10:04 AM   Specimen: BLOOD RIGHT FOREARM  Result Value Ref Range Status   Specimen Description BLOOD RIGHT FOREARM  Final   Special Requests   Final    BOTTLES DRAWN AEROBIC AND ANAEROBIC Blood Culture adequate volume   Culture   Final    NO GROWTH 5 DAYS Performed at [facility], [address], [place], [place] [address]    Report Status 01/20/2022 FINAL  Final  Resp Panel by RT-PCR (Flu A&B, Covid) Anterior Nasal Swab     Status: None   Collection Time: 01/15/22 10:05 AM   Specimen: Anterior Nasal Swab  Result Value Ref Range Status   SARS Coronavirus 2 by RT PCR NEGATIVE NEGATIVE Final    Comment: (NOTE) SARS-CoV-2 target nucleic acids are NOT DETECTED.  The SARS-CoV-2 RNA is generally detectable in upper respiratory specimens during the acute phase of infection. The lowest concentration of SARS-CoV-2 viral copies this assay can detect is 138 copies/mL. A negative result does not preclude SARS-Cov-2 infection and should not be used as the sole basis for treatment or other patient management decisions. A negative result may occur with  improper specimen collection/handling,  submission of specimen other than nasopharyngeal swab, presence of viral mutation(s) within the areas targeted by this assay, and inadequate number of viral copies(<138 copies/mL). A negative result must be combined with clinical observations, patient history, and epidemiological information. The expected result is Negative.  Fact Sheet for Patients:  EntrepreneurPulse.com.au  Fact Sheet for Healthcare Providers:  IncredibleEmployment.be  This test is no t yet approved or cleared by the [place] FDA and  has been authorized for detection and/or diagnosis of SARS-CoV-2 by FDA under an Emergency Use Authorization (EUA). This EUA will remain  in effect (meaning this test can be used) for the duration of the COVID-19 declaration under Section 564(b)(1) of the Act, 21 U.S.C.section 360bbb-3(b)(1), unless the authorization is terminated  or revoked sooner.       Influenza A by PCR NEGATIVE NEGATIVE Final   Influenza B by PCR NEGATIVE NEGATIVE Final  Comment: (NOTE) The Xpert Xpress SARS-CoV-2/FLU/RSV plus assay is intended as an aid in the diagnosis of influenza from Nasopharyngeal swab specimens and should not be used as a sole basis for treatment. Nasal washings and aspirates are unacceptable for Xpert Xpress SARS-CoV-2/FLU/RSV testing.  Fact Sheet for Patients: EntrepreneurPulse.com.au  Fact Sheet for Healthcare Providers: IncredibleEmployment.be  This test is not yet approved or cleared by the [place] FDA and has been authorized for detection and/or diagnosis of SARS-CoV-2 by FDA under an Emergency Use Authorization (EUA). This EUA will remain in effect (meaning this test can be used) for the duration of the COVID-19 declaration under Section 564(b)(1) of the Act, 21 U.S.C. section 360bbb-3(b)(1), unless the authorization is terminated or revoked.  Performed at [facility], [place] [address], [place], [place] [address]   Blood Culture (routine x 2)     Status: None   Collection Time: 01/15/22 10:09 AM   Specimen: BLOOD  Result Value Ref Range Status   Specimen Description BLOOD SITE NOT SPECIFIED  Final   Special Requests   Final    BOTTLES DRAWN AEROBIC AND ANAEROBIC Blood Culture adequate volume   Culture   Final    NO GROWTH 5 DAYS Performed at [facility], [address], [place], [place] [address]    Report Status 01/20/2022 FINAL  Final  MRSA Next Gen by PCR, Nasal     Status: None   Collection Time: 01/15/22  1:28 PM   Specimen: Nasal Mucosa; Nasal Swab  Result Value Ref Range Status   MRSA by PCR Next Gen NOT DETECTED NOT DETECTED Final    Comment: (NOTE) The GeneXpert MRSA Assay (FDA approved for NASAL specimens only), is one component of a comprehensive MRSA colonization surveillance program. It is not intended to diagnose MRSA infection nor to guide or monitor treatment for MRSA infections. Test performance is not FDA approved in patients less than 54 years old. Performed at [facility], [place] [address], [place], [place] [address]   Body fluid culture w Gram Stain     Status: None   Collection Time: 01/15/22  5:59 PM   Specimen: Synovial Fluid  Result Value Ref Range Status   Specimen Description FLUID SYNOVIAL KNEE LEFT  Final   Special Requests NONE  Final   Gram Stain   Final    ABUNDANT WBC PRESENT, PREDOMINANTLY PMN NO ORGANISMS SEEN    Culture   Final    NO GROWTH 3 DAYS Performed at [facility], [place] [address] [place], [place] [address]    Report Status 01/18/2022 FINAL  Final  Anaerobic culture w Gram Stain     Status: None   Collection Time: 01/15/22  5:59 PM   Specimen: Synovial Fluid  Result Value Ref Range Status   Specimen Description FLUID SYNOVIAL KNEE LEFT  Final   Special Requests NONE  Final   Gram Stain   Final    ABUNDANT WBC PRESENT, PREDOMINANTLY PMN NO ORGANISMS SEEN    Culture   Final     NO ANAEROBES ISOLATED Performed at [facility], [place] [address] [place], [place] [address]    Report Status 01/20/2022 FINAL  Final  Body fluid culture w Gram Stain     Status: None   Collection Time: 01/16/22 11:48 AM   Specimen: Synovium; Body Fluid  Result Value Ref Range Status   Specimen Description SYNOVIAL  Final   Special Requests  RIGHT KNEE  Final   Gram Stain   Final    ABUNDANT WBC PRESENT, PREDOMINANTLY PMN NO ORGANISMS SEEN    Culture   Final    NO GROWTH 3 DAYS Performed at [facility] [address] [place], [place] [address]    Report Status 01/19/2022 FINAL  Final    Pertinent Lab.    Latest Ref Rng & Units 01/23/2022    4:50 AM 01/22/2022    4:43 PM 01/22/2022    4:05 AM  CBC  WBC 4.0 - 10.5 K/uL 19.2  19.7  22.1   Hemoglobin 13.0 - 17.0 g/dL 8.7  8.3  8.3   Hematocrit 39.0 - 52.0 % 26.0  26.0  25.4   Platelets 150 - 400 K/uL 196  168  156       Latest Ref Rng & Units 01/23/2022    4:50 AM 01/22/2022    4:05 AM 01/21/2022    2:58 AM  CMP  Glucose 70 - 99 mg/dL 304  181  233   BUN 8 - 23 mg/dL 140  110  140   Creatinine 0.61 - 1.24 mg/dL 7.77  6.83  8.00   Sodium 135 - 145 mmol/L 133  135  130   Potassium 3.5 - 5.1 mmol/L 4.2  4.0  3.9   Chloride 98 - 111 mmol/L 92  95  92   CO2 22 - 32 mmol/L 22  22  22    Calcium 8.9 - 10.3 mg/dL 8.5  8.1  7.8   Total Protein 6.5 - 8.1 g/dL 5.9   5.5   Total Bilirubin 0.3 - 1.2 mg/dL 0.6   1.0   Alkaline Phos 38 - 126 U/L 81   83   AST 15 - 41 U/L 35   75   ALT 0 - 44 U/L 48   72      Pertinent Imaging today Plain films and CT images have been personally visualized and interpreted; radiology reports have been reviewed. Decision making incorporated into the Impression / Recommendations.  IR Fluoro Guide CV Line Right  Result Date: 01/18/2022 INDICATION: 65 year old male with worsening end-stage renal failure requiring initiation of hemodialysis. Temporary hemodialysis catheter requested. EXAM:  NON-TUNNELED CENTRAL VENOUS HEMODIALYSIS CATHETER PLACEMENT WITH ULTRASOUND AND FLUOROSCOPIC GUIDANCE COMPARISON:  None Available. MEDICATIONS: None FLUOROSCOPY TIME:  One mGy COMPLICATIONS: None immediate. PROCEDURE: Informed written consent was obtained from the patient after a discussion of the risks, benefits, and alternatives to treatment. Questions regarding the procedure were encouraged and answered. The right neck and chest were prepped with chlorhexidine in a sterile fashion, and a sterile drape was applied covering the operative field. Maximum barrier sterile technique with sterile gowns and gloves were used for the procedure. A timeout was performed prior to the initiation of the procedure. After the overlying soft tissues were anesthetized, a small venotomy incision was created and a micropuncture kit was utilized to access the internal jugular vein. Real-time ultrasound guidance was utilized for vascular access including the acquisition of a permanent ultrasound image documenting patency of the accessed vessel. The microwire was utilized to measure appropriate catheter length. A Rosen wire was advanced to the level of the IVC. Under fluoroscopic guidance, the venotomy was serially dilated, ultimately allowing placement of a 20 cm temporary Trialysis catheter with tip ultimately terminating within the superior aspect of the right atrium. Final catheter positioning was confirmed and documented with a spot radiographic image. The catheter aspirates and flushes normally. The catheter was flushed  with appropriate volume heparin dwells. The catheter exit site was secured with a 0 silk retention suture. A dressing was placed. The patient tolerated the procedure well without immediate post procedural complication. IMPRESSION: Successful placement of a right internal jugular approach 20 cm temporary dialysis catheter with tip terminating with in the superior aspect of the right atrium. The catheter is ready for  immediate use. PLAN: This catheter may be converted to a tunneled dialysis catheter at a later date as indicated. Ruthann Cancer, MD [facility] Electronically Signed   By: Ruthann Cancer M.D.   On: 01/18/2022 10:04   US Abdomen Limited RUQ (LIVER/GB)  Result Date: 01/17/2022 CLINICAL DATA:  Elevated LFTs EXAM: ULTRASOUND ABDOMEN LIMITED RIGHT UPPER QUADRANT COMPARISON:  None Available. FINDINGS: Gallbladder: No gallstones or wall thickening visualized. No sonographic Murphy sign noted by sonographer. Common bile duct: Diameter: 3.7 mm Liver: No focal lesion identified. Within normal limits in parenchymal echogenicity. Portal vein is patent on color Doppler imaging with normal direction of blood flow towards the liver. Other: None. IMPRESSION: Normal right upper quadrant ultrasound. Electronically Signed   By: Ronney Asters M.D.   On: 01/17/2022 19:30   ECHOCARDIOGRAM COMPLETE  Result Date: 01/17/2022    ECHOCARDIOGRAM REPORT   Patient Name:   DRUE HARR Date of Exam: 01/17/2022 Medical Rec #:  [id number]      Height:       71.0 in Accession #:    [id number]     Weight:       272.9 lb Date of Birth:  [birth year]       BSA:          2.406 m Patient Age:    47 years       BP:           123/65 mmHg Patient Gender: M              HR:           84 bpm. Exam Location:  Inpatient Procedure: 2D Echo, Cardiac Doppler, Color Doppler and Intracardiac            Opacification Agent Indications:    Abnormal CXR  History:        Patient has prior history of Echocardiogram examinations, most                 recent 04/20/2019. CHF; Risk Factors:Hypertension, Diabetes and                 Dyslipidemia.  Sonographer:    Jefferey Pica Referring Phys: [phone] A CALDWELL POWELL JR IMPRESSIONS  1. Left ventricular ejection fraction, by estimation, is 55 to 60%. The left ventricle has normal function. The left ventricle has no regional wall motion abnormalities. There is  mild concentric left ventricular hypertrophy. Left ventricular diastolic parameters were normal.  2. Right ventricular systolic function is normal. The right ventricular size is normal. There is normal pulmonary artery systolic pressure.  3. The mitral valve is normal in structure. Trivial mitral valve regurgitation. No evidence of mitral stenosis.  4. The aortic valve is tricuspid. Aortic valve regurgitation is not visualized. No aortic stenosis is present.  5. The inferior vena cava is normal in size with greater than 50% respiratory variability, suggesting right atrial pressure of 3 mmHg. FINDINGS  Left Ventricle: Left ventricular ejection fraction, by estimation, is 55 to 60%. The left ventricle has normal function. The left ventricle has no regional wall motion  abnormalities. The left ventricular internal cavity size was normal in size. There is  mild concentric left ventricular hypertrophy. Left ventricular diastolic parameters were normal. Right Ventricle: The right ventricular size is normal. No increase in right ventricular wall thickness. Right ventricular systolic function is normal. There is normal pulmonary artery systolic pressure. The tricuspid regurgitant velocity is 2.74 m/s, and  with an assumed right atrial pressure of 5 mmHg, the estimated right ventricular systolic pressure is 79.8 mmHg. Left Atrium: Left atrial size was normal in size. Right Atrium: Right atrial size was normal in size. Pericardium: There is no evidence of pericardial effusion. Mitral Valve: The mitral valve is normal in structure. Trivial mitral valve regurgitation. No evidence of mitral valve stenosis. Tricuspid Valve: The tricuspid valve is normal in structure. Tricuspid valve regurgitation is trivial. No evidence of tricuspid stenosis. Aortic Valve: The aortic valve is tricuspid. Aortic valve regurgitation is not visualized. No aortic stenosis is present. Aortic valve peak gradient measures 8.9 mmHg. Pulmonic Valve: The  pulmonic valve was not well visualized. Pulmonic valve regurgitation is not visualized. No evidence of pulmonic stenosis. Aorta: The aortic root is normal in size and structure. Venous: The inferior vena cava is normal in size with greater than 50% respiratory variability, suggesting right atrial pressure of 3 mmHg. IAS/Shunts: No atrial level shunt detected by color flow Doppler.  LEFT VENTRICLE PLAX 2D LVIDd:         5.50 cm   Diastology LVIDs:         3.70 cm   LV e' medial:    7.49 cm/s LV PW:         1.20 cm   LV E/e' medial:  13.0 LV IVS:        1.20 cm   LV e' lateral:   7.56 cm/s LVOT diam:     2.00 cm   LV E/e' lateral: 12.9 LV SV:         82 LV SV Index:   34 LVOT Area:     3.14 cm  RIGHT VENTRICLE             IVC RV S prime:     12.80 cm/s  IVC diam: 2.00 cm TAPSE (M-mode): 2.6 cm LEFT ATRIUM           Index        RIGHT ATRIUM           Index LA diam:      4.10 cm 1.70 cm/m   RA Area:     17.00 cm LA Vol (A4C): 68.8 ml 28.59 ml/m  RA Volume:   39.30 ml  16.33 ml/m  AORTIC VALVE                 PULMONIC VALVE AV Area (Vmax): 2.99 cm     PV Vmax:       0.89 m/s AV Vmax:        149.00 cm/s  PV Peak grad:  3.2 mmHg AV Peak Grad:   8.9 mmHg LVOT Vmax:      142.00 cm/s LVOT Vmean:     78.300 cm/s LVOT VTI:       0.260 m  AORTA Ao Root diam: 3.50 cm Ao Asc diam:  3.40 cm MITRAL VALVE               TRICUSPID VALVE MV Area (PHT): 7.09 cm    TR Peak grad:   30.0 mmHg MV Decel Time: 107 msec  TR Vmax:        274.00 cm/s MV E velocity: 97.30 cm/s MV A velocity: 47.70 cm/s  SHUNTS MV E/A ratio:  2.04        Systemic VTI:  0.26 m                            Systemic Diam: 2.00 cm Kardie Tobb DO Electronically signed by Berniece Salines DO Signature Date/Time: 01/17/2022/11:57:08 AM    Final    US RENAL  Result Date: 01/16/2022 CLINICAL DATA:  AKI. EXAM: RENAL / URINARY TRACT ULTRASOUND COMPLETE COMPARISON:  08/17/2021. FINDINGS: Right Kidney: Renal measurements: 10.5 x 5.4 x 4.8 cm = volume: 142.05 mL.  Echogenicity within normal limits. No mass or hydronephrosis visualized. Left Kidney: Renal measurements: 10.3 x 5.9 x 4.6 cm = volume: 146.72 mL. Echogenicity within normal limits. An avascular hypoechoic region is noted in the mid left kidney measuring 2.5 x 1.7 cm, possible cyst. No mass or hydronephrosis visualized. Bladder: Appears normal for degree of bladder distention. Other: Examination is limited due to patient's body habitus and limited mobility. IMPRESSION: 1. Avascular hypoechoic structure in the mid left kidney, possible cyst. 2. Normal evaluation of the right kidney and bladder. Electronically Signed   By: Brett Fairy M.D.   On: 01/16/2022 20:16   MR BRAIN WO CONTRAST  Result Date: 01/16/2022 CLINICAL DATA:  Neuro deficit, acute, stroke suspected. EXAM: MRI HEAD WITHOUT CONTRAST TECHNIQUE: Multiplanar, multiecho pulse sequences of the brain and surrounding structures were obtained without intravenous contrast. COMPARISON:  01/15/2022 CT FINDINGS: Brain: Diffusion imaging does not show any acute or subacute infarction or other cause of restricted diffusion. There is generalized brain atrophy, advanced for age. Old small vessel infarctions affect the central pons. Few old small vessel cerebellar infarctions. Cerebral hemispheres show old infarctions of the left basal ganglia and radiating white matter tracts. Mild chronic small-vessel ischemic change seen elsewhere. No large vessel territory infarction. No mass, hemorrhage, hydrocephalus or extra-axial collection. Vascular: Major vessels at the base of the brain show flow. Skull and upper cervical spine: Negative Sinuses/Orbits: Clear/normal Other: None IMPRESSION: No acute MR finding. Accelerated generalized atrophy. 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 Electronically Signed   By: Nelson Chimes M.D.   On: 01/16/2022  16:12   DG Hand 2 View Left  Result Date: 01/16/2022 CLINICAL DATA:  Pain and swelling after falling from bed. EXAM: LEFT HAND - 2 VIEW COMPARISON:  None Available. FINDINGS: Generalized regional swelling. No evidence fracture or dislocation. Small erosions are noted at several of the inter phalangeal joints which could be seen with erosive arthritis including erosive osteoarthritis. IMPRESSION: No acute finding other than soft tissue swelling. 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 Electronically Signed   By: Nelson Chimes M.D.   On: 01/16/2022 15:07   DG Chest 1 View  Result Date: 01/16/2022 CLINICAL DATA:  Sepsis. EXAM: CHEST  1 VIEW COMPARISON:  01/15/2022 FINDINGS: 0505 hours. The cardio pericardial silhouette is enlarged. Low volume film. Bibasilar atelectasis noted. There is pulmonary vascular congestion without overt pulmonary edema. Telemetry leads overlie the chest. IMPRESSION: Enlarged cardiopericardial silhouette with vascular congestion. Electronically Signed   By: Misty Stanley M.D.   On: 01/16/2022 06:00   DG Knee Complete 4 Views Right  Result Date: 01/15/2022 CLINICAL DATA:  Fall, pain  and swelling, initial encounter. EXAM: RIGHT KNEE - COMPLETE 4+ VIEW COMPARISON:  None Available. FINDINGS: Large joint effusion. No fracture. Slight varus angulation of the knee joint with minimal lateral subluxation of the proximal tibia with respect to the distal femur. Marked loss of joint space in the medial compartment with subchondral sclerosis and osteophytosis. Additional osteophytosis in the medial and patellofemoral compartments. IMPRESSION: 1. Moderate to large joint effusion.  No fracture. 2. 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 Electronically Signed   By: Lorin Picket M.D.   On: 01/15/2022 13:53   CT Head Wo Contrast  Result Date: 01/15/2022 CLINICAL DATA:  Fall off a bed on Friday. EXAM: CT HEAD WITHOUT CONTRAST  TECHNIQUE: Contiguous axial images were obtained from the base of the skull through the vertex without intravenous contrast. RADIATION DOSE REDUCTION: This exam was performed according to the departmental dose-optimization program which includes automated exposure control, adjustment of the mA and/or kV according to patient size and/or use of iterative reconstruction technique. COMPARISON:  None Available. FINDINGS: Brain: There is no acute intracranial hemorrhage, extra-axial fluid collection, or acute infarct. There is mild volume loss primarily affecting the frontal lobes. The ventricles are normal in size. Gray-white differentiation is preserved. There is no mass lesion.  There is no mass effect or midline shift. Vascular: There is calcification of the bilateral cavernous ICAs. Skull: Normal. Negative for fracture or focal lesion. Sinuses/Orbits: The paranasal sinuses are clear. The globes and orbits are unremarkable. Other: None. IMPRESSION: No acute intracranial pathology. Electronically Signed   By: Valetta Mole M.D.   On: 01/15/2022 12:30   DG Chest Port 1 View  Result Date: 01/15/2022 CLINICAL DATA:  Questionable sepsis. EXAM: PORTABLE CHEST 1 VIEW COMPARISON:  Chest radiograph dated April 20, 2008 FINDINGS: The heart is mildly enlarged. Chronic elevation of the right hemidiaphragm. Lungs are otherwise clear without evidence of focal consolidation or large pleural effusion. IMPRESSION: 1.  Stable mild cardiomegaly. 2. Chronic elevation of the right hemidiaphragm. No evidence of pneumonia or pleural effusion. Electronically Signed   By: Keane Police D.O.   On: 01/15/2022 10:54     I spent more 50  minutes for this patient encounter including review of prior medical records, coordination of care with primary/other specialist with greater than 50% of time being face to face/counseling and discussing diagnostics/treatment plan with the patient/family.  Electronically signed by:   Rosiland Oz,  MD Infectious Disease Physician [facility] Pager: [phone]

## 2022-01-23 NOTE — Progress Notes (Signed)
PROGRESS NOTE        PATIENT DETAILS Name: [name] Age: 66 y.o. Sex: male Date of Birth: 06-Sep-1955 Admit Date: 01/15/2022 Admitting Physician Lequita Halt, MD QQV:[id number], Malva Limes., FNP  Brief Summary: Patient is a 66 y.o.  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  See below for further details.  Significant events: 8/14>> admit for confusion/gouty arthritis/progression to ESRD. 8/16>> worsening transaminases-renal planning HD. 8/17>> TDC placed-first HD planned. 8/18>> Afib RVR with first HD session-HD cut short. Spontaneously converted to sinus rhythm.Oozing from nontunneled HD cath site.  8/19>> Brief run of RVR.  Tolerated HD without any incident. 8/20>> Worsening encephalopathy-urgent HD, 2 episodes of painless small-volume hematochezia. Hemoglobin dropped to 6.7-given 2 units of PRBC with HD.  8/21>> less encephalopathic-answering simple questions-another episode (third) of hematochezia earlier this morning.  Significant studies: 8/15>> MRI brain: No acute CVA. 8/15>> renal ultrasound: No hydronephrosis. 8/14>> left knee synovial fluid: WBC 23,835, extracellular sodium urate crystals. 8/15>> right knee synovial fluid: WBC 15,600, intracellular monosodium urate crystals. 8/16>> Echo: EF 55-60% 8/16>> RUQ ultrasound: No major abnormalities noted.  Significant microbiology data: 8/14>> blood culture: No growth 8/14>> left knee synovial fluid: No growth 8/15>> right knee synovial fluid: No growth  Procedures: 8/14>> left knee arthrocentesis 8/15>> right knee arthrocentesis 8/17>> tunneled HD catheter placement by IR.  Consults: Nephrology. Orthopedics Cardiology Vascular ID GI  Subjective: No GI bleeding overnight-slightly lethargic  than yesterday but is awake and alert.  Son at bedside-and attempting to feed patient.  Continues to have arthralgias involving bilateral knees-myalgias involving thighs-and arthralgias involving left hand.   Objective: Vitals: Blood pressure (!) 140/70, pulse 73, temperature 98 F (36.7 C), temperature source Oral, resp. rate 17, height '5\' 11"'$  (1.803 m), weight 124.6 kg, SpO2 95 %.   Exam: Gen Exam: Slightly lethargic but awake/alert-not in any distress.  Chronic sick appearing HEENT:atraumatic, normocephalic Chest: B/L clear to auscultation anteriorly CVS:S1S2 regular Abdomen:soft non tender, non distended Extremities:no edema Neurology: Generalized weakness but nonfocal exam.  Left upper extremity swelling/mild weakness is chronic per son. Skin: no rash   Pertinent Labs/Radiology:    Latest Ref Rng & Units 01/23/2022    4:50 AM 01/22/2022    4:43 PM 01/22/2022    4:05 AM  CBC  WBC 4.0 - 10.5 K/uL 19.2  19.7  22.1   Hemoglobin 13.0 - 17.0 g/dL 8.7  8.3  8.3   Hematocrit 39.0 - 52.0 % 26.0  26.0  25.4   Platelets 150 - 400 K/uL 196  168  156     Lab Results  Component Value Date   NA 133 (L) 01/23/2022   K 4.2 01/23/2022   CL 92 (L) 01/23/2022   CO2 22 01/23/2022     Assessment/Plan: SIRS due to acute gouty arthritis involving bilateral knees, numerous joints of left hand: Continues to have low-grade myalgias/arthralgias-afebrile but leukocytosis persists.  Steroids being tapered down-appreciate ID/orthopedics input.  Continue to follow CBC noted steroids down to 10 mg of oral prednisone.  If continues to have persistent leukocytosis-May need to repeat cultures.  CKD stage V with progression to ESRD: Nephrology following-BUN significantly elevated today-for HD later  today.   Hyperkalemia: Due to worsening renal function-resolved with Lokelma and HD.  Continue to monitor periodically.  Oozing from tunneled HD catheter site: Evaluated by IR on 8/18-bleeding site sutured-no  further bleeding evident.  A-fib with RVR: Triggered by uremia/acute illness-now maintaining sinus rhythm on oral beta-blocker-Eliquis on hold due to ongoing lower GI bleeding.   Lower GI bleeding: No further hematochezia overnight-Hb relatively stable-remains off Eliquis-GI following with tentative plans for endoscopic evaluation of the next few days.  Follow CBC.   Normocytic anemia: Multifactorial etiology-due to acute illness/CKD/lower GI bleeding.  Transfused 2 units of PRBC on 8/20-hemoglobin remains relatively stable.  Continue to follow CBC.  Acute metabolic encephalopathy: Due to uremia-mentation improved but somewhat slow today-BUN significantly elevated.  Hopeful that with further decrease in his steroid dosage and ongoing dialysis-mentation will improve.  If he continues to have fluctuating/persisting encephalopathy-May need to repeat neuroimaging.    Transaminitis: Unclear etiology-suspect probably medication related-possibly related to cefepime.  Statin/Zytiga was held-LFTs have improved.  RUQ ultrasound was unremarkable.    Hyponatremia: Related to volume-improved with HD.  Hypoglycemia: Likely related to use of oral hypoglycemic agents in the setting of worsening renal function.  Continue to watch closely.  DM-2 (A1c 9.6 on 8/14) with uncontrolled hyperglycemia due to steroids: CBGs significantly elevated-increase Levemir to 30 units twice daily, increase Premeal NovoLog to 10 units-follow and optimize.    Will need to permanently stop glipizide and metformin on discharge.  Recent Labs    01/22/22 1757 01/22/22 2047 01/23/22 0810  GLUCAP 466* 410* 284*     Chronic left arm weakness: Per patient's son-this has been ongoing for the past several months-son has noted a problem with the patient's grip mostly.  MRI brain negative for acute CVA.  We will need to check a MRI C-spine at some point.  Suspect some of it may be due to acute gouty arthritis involving his small hand  joints as well.  Superficialization of left upper extremity AV fistula by vascular surgery on 8/8: With some residual arm swelling that seems to have worsened over the past few days-appreciate vascular surgery evaluation.  Prostate cancer: On prednisone/Zytiga-currently on higher doses of prednisone than usual-Zytiga held due to elevated LFTs but since they have improved-discussed with pharmacy team-Zytiga to be resumed.    Morbid Obesity: Estimated body mass index is 38.31 kg/m as calculated from the following:   Height as of this encounter: '5\' 11"'$  (1.803 m).   Weight as of this encounter: 124.6 kg.   Code status:   Code Status: Full Code   DVT Prophylaxis: Hold Eliquis due to ongoing hematochezia.   Family Communication: Son at bedside on 8/22   Disposition Plan: Status is: Inpatient Remains inpatient appropriate because: Likely progression to ESRD-nephrology initiating dialysis-remains encephalopathic-not yet stable for discharge.   Planned Discharge Destination:SNF  Diet: Diet Order             Diet clear liquid Room service appropriate? Yes; Fluid consistency: Thin  Diet effective 0500                     Antimicrobial agents: Anti-infectives (From admission, onward)    Start     Dose/Rate Route Frequency Ordered Stop   01/16/22 1000  ceFEPIme (MAXIPIME) 2 g in sodium chloride 0.9 % 100 mL IVPB  Status:  Discontinued        2 g 200 mL/hr over 30 Minutes Intravenous Every 24 hours 01/15/22 1014 01/17/22 1355  01/15/22 1015  ceFEPIme (MAXIPIME) 2 g in sodium chloride 0.9 % 100 mL IVPB        2 g 200 mL/hr over 30 Minutes Intravenous  Once 01/15/22 1005 01/15/22 1127   01/15/22 1015  metroNIDAZOLE (FLAGYL) IVPB 500 mg        500 mg 100 mL/hr over 60 Minutes Intravenous  Once 01/15/22 1005 01/15/22 1213   01/15/22 1015  vancomycin (VANCOCIN) IVPB 1000 mg/200 mL premix  Status:  Discontinued        1,000 mg 200 mL/hr over 60 Minutes Intravenous  Once 01/15/22  1005 01/15/22 1013   01/15/22 1015  vancomycin (VANCOREADY) IVPB 2000 mg/400 mL        2,000 mg 200 mL/hr over 120 Minutes Intravenous  Once 01/15/22 1013 01/15/22 1325   01/15/22 1011  vancomycin variable dose per unstable renal function (pharmacist dosing)  Status:  Discontinued         Does not apply See admin instructions 01/15/22 1011 01/18/22 1526        MEDICATIONS: Scheduled Meds:  sodium chloride   Intravenous Once   sodium chloride   Intravenous Once   abiraterone acetate  1,000 mg Oral Daily   acetaminophen  650 mg Oral Once   calcitRIOL  0.25 mcg Oral Daily   carvedilol  25 mg Oral BID WC   Chlorhexidine Gluconate Cloth  6 each Topical Q0600   diphenhydrAMINE  25 mg Intravenous Once   feeding supplement (NEPRO CARB STEADY)  237 mL Oral BID BM   insulin aspart  0-9 Units Subcutaneous TID WC   insulin aspart  10 Units Subcutaneous TID WC   insulin detemir  30 Units Subcutaneous BID   multivitamin  1 tablet Oral QHS   pantoprazole  40 mg Oral BID   predniSONE  10 mg Oral Q breakfast   sodium chloride flush  10-40 mL Intracatheter Q12H   sodium chloride flush  3 mL Intravenous Q12H   sodium chloride flush  3 mL Intravenous Q12H   Continuous Infusions:  sodium chloride     sodium chloride     anticoagulant sodium citrate     PRN Meds:.sodium chloride, sodium chloride, acetaminophen, hydrALAZINE, HYDROmorphone (DILAUDID) injection, lidocaine (PF), lidocaine-prilocaine, metoprolol tartrate, ondansetron (ZOFRAN) IV, pentafluoroprop-tetrafluoroeth, sodium chloride, sodium chloride flush, sodium chloride flush, sodium chloride flush   I have personally reviewed following labs and imaging studies  LABORATORY DATA: CBC: Recent Labs  Lab 01/17/22 0441 01/18/22 0339 01/21/22 0258 01/21/22 1700 01/22/22 0405 01/22/22 1643 01/23/22 0450  WBC 15.3*   < > 20.3* 25.4* 22.1* 19.7* 19.2*  NEUTROABS 14.0*  --   --   --   --   --   --   HGB 8.1*   < > 6.7* 9.5* 8.3* 8.3*  8.7*  HCT 25.9*   < > 20.4* 27.9* 25.4* 26.0* 26.0*  MCV 92.8   < > 88.3 83.5 86.1 87.2 86.1  PLT 163   < > 155 168 156 168 196   < > = values in this interval not displayed.     Basic Metabolic Panel: Recent Labs  Lab 01/17/22 0441 01/17/22 1005 01/18/22 0339 01/19/22 0321 01/20/22 0343 01/20/22 0820 01/21/22 0258 01/22/22 0405 01/23/22 0450  NA 132* 129* 129*   < > 133* 133* 130* 135 133*  K 5.8* 5.4* 4.8   < > 4.5 4.2 3.9 4.0 4.2  CL 95* 93* 93*   < > 93* 95* 92* 95* 92*  CO2  18* 16* 17*   < > 20* 19* '22 22 22  '$ GLUCOSE 367* 360* 360*   < > 178* 159* 233* 181* 304*  BUN 145* 149* 170*   < > 177* 177* 140* 110* 140*  CREATININE 8.49* 8.64* 9.54*   < > 10.06* 9.55* 8.00* 6.83* 7.77*  CALCIUM 7.4* 7.5* 7.1*   < > 8.3* 7.9* 7.8* 8.1* 8.5*  MG 2.4  --   --   --   --   --   --   --   --   PHOS 9.7* 9.7* 10.1*  --   --  10.2*  --  7.7*  --    < > = values in this interval not displayed.     GFR: Estimated Creatinine Clearance: 12.2 mL/min (A) (by C-G formula based on SCr of 7.77 mg/dL (H)).  Liver Function Tests: Recent Labs  Lab 01/18/22 1042 01/19/22 0321 01/20/22 0343 01/20/22 0820 01/21/22 0258 01/22/22 0405 01/23/22 0450  AST 456* 345* 149*  --  75*  --  35  ALT 132* 129* 95*  --  72*  --  48*  ALKPHOS 125 133* 94  --  83  --  81  BILITOT 0.7 0.9 0.8  --  1.0  --  0.6  PROT 6.0* 6.1* 5.8*  --  5.5*  --  5.9*  ALBUMIN 1.6* 1.6* 1.6* 1.6* 1.6* 1.6* 1.7*    No results for input(s): "LIPASE", "AMYLASE" in the last 168 hours. No results for input(s): "AMMONIA" in the last 168 hours.   Coagulation Profile: No results for input(s): "INR", "PROTIME" in the last 168 hours.   Cardiac Enzymes: No results for input(s): "CKTOTAL", "CKMB", "CKMBINDEX", "TROPONINI" in the last 168 hours.  BNP (last 3 results) No results for input(s): "PROBNP" in the last 8760 hours.  Lipid Profile: No results for input(s): "CHOL", "HDL", "LDLCALC", "TRIG", "CHOLHDL", "LDLDIRECT"  in the last 72 hours.  Thyroid Function Tests: No results for input(s): "TSH", "T4TOTAL", "FREET4", "T3FREE", "THYROIDAB" in the last 72 hours.   Anemia Panel: No results for input(s): "VITAMINB12", "FOLATE", "FERRITIN", "TIBC", "IRON", "RETICCTPCT" in the last 72 hours.   Urine analysis:    Component Value Date/Time   COLORURINE YELLOW 05/04/2008 2129   APPEARANCEUR Cloudy (A) 08/19/2015 1403   LABSPEC 1.012 05/04/2008 2129   PHURINE 5.5 05/04/2008 2129   GLUCOSEU Negative 08/19/2015 1403   GLUCOSEU 500 (A) 05/04/2008 2129   HGBUR SMALL (A) 04/22/2008 0848   BILIRUBINUR Negative 08/19/2015 1403   KETONESUR NEG mg/dL 05/04/2008 2129   PROTEINUR 1+ (A) 08/19/2015 1403   PROTEINUR 30 (A) 05/04/2008 2129   UROBILINOGEN 0.2 05/04/2008 2129   NITRITE Negative 08/19/2015 1403   NITRITE NEG 05/04/2008 2129   LEUKOCYTESUR 1+ (A) 08/19/2015 1403    Sepsis Labs: Lactic Acid, Venous    Component Value Date/Time   LATICACIDVEN 2.6 (HH) 01/15/2022 1026    MICROBIOLOGY: Recent Results (from the past 240 hour(s))  Blood Culture (routine x 2)     Status: None   Collection Time: 01/15/22 10:04 AM   Specimen: BLOOD RIGHT FOREARM  Result Value Ref Range Status   Specimen Description BLOOD RIGHT FOREARM  Final   Special Requests   Final    BOTTLES DRAWN AEROBIC AND ANAEROBIC Blood Culture adequate volume   Culture   Final    NO GROWTH 5 DAYS Performed at [facility], [place] [address], [place],  [address]    Report Status 01/20/2022 FINAL  Final  Resp Panel by RT-PCR (Flu A&B, Covid) Anterior Nasal Swab     Status: None   Collection Time: 01/15/22 10:05 AM   Specimen: Anterior Nasal Swab  Result Value Ref Range Status   SARS Coronavirus 2 by RT PCR NEGATIVE NEGATIVE Final    Comment: (NOTE) SARS-CoV-2 target nucleic acids are NOT DETECTED.  The SARS-CoV-2 RNA is generally detectable in upper respiratory specimens during the acute phase of infection. The  lowest concentration of SARS-CoV-2 viral copies this assay can detect is 138 copies/mL. A negative result does not preclude SARS-Cov-2 infection and should not be used as the sole basis for treatment or other patient management decisions. A negative result may occur with  improper specimen collection/handling, submission of specimen other than nasopharyngeal swab, presence of viral mutation(s) within the areas targeted by this assay, and inadequate number of viral copies(<138 copies/mL). A negative result must be combined with clinical observations, patient history, and epidemiological information. The expected result is Negative.  Fact Sheet for Patients:  EntrepreneurPulse.com.au  Fact Sheet for Healthcare Providers:  IncredibleEmployment.be  This test is no t yet approved or cleared by the [place] FDA and  has been authorized for detection and/or diagnosis of SARS-CoV-2 by FDA under an Emergency Use Authorization (EUA). This EUA will remain  in effect (meaning this test can be used) for the duration of the COVID-19 declaration under Section 564(b)(1) of the Act, 21 U.S.C.section 360bbb-3(b)(1), unless the authorization is terminated  or revoked sooner.       Influenza A by PCR NEGATIVE NEGATIVE Final   Influenza B by PCR NEGATIVE NEGATIVE Final    Comment: (NOTE) The Xpert Xpress SARS-CoV-2/FLU/RSV plus assay is intended as an aid in the diagnosis of influenza from Nasopharyngeal swab specimens and should not be used as a sole basis for treatment. Nasal washings and aspirates are unacceptable for Xpert Xpress SARS-CoV-2/FLU/RSV testing.  Fact Sheet for Patients: EntrepreneurPulse.com.au  Fact Sheet for Healthcare Providers: IncredibleEmployment.be  This test is not yet approved or cleared by the [place] FDA and has been authorized for detection and/or diagnosis of SARS-CoV-2 by FDA under  an Emergency Use Authorization (EUA). This EUA will remain in effect (meaning this test can be used) for the duration of the COVID-19 declaration under Section 564(b)(1) of the Act, 21 U.S.C. section 360bbb-3(b)(1), unless the authorization is terminated or revoked.  Performed at [facility], [place] [address], [place], [place] [address]   Blood Culture (routine x 2)     Status: None   Collection Time: 01/15/22 10:09 AM   Specimen: BLOOD  Result Value Ref Range Status   Specimen Description BLOOD SITE NOT SPECIFIED  Final   Special Requests   Final    BOTTLES DRAWN AEROBIC AND ANAEROBIC Blood Culture adequate volume   Culture   Final    NO GROWTH 5 DAYS Performed at [facility], [address], [place], [place] [address]    Report Status 01/20/2022 FINAL  Final  MRSA Next Gen by PCR, Nasal     Status: None   Collection Time: 01/15/22  1:28 PM   Specimen: Nasal Mucosa; Nasal Swab  Result Value Ref Range Status   MRSA by PCR Next Gen NOT DETECTED NOT DETECTED Final    Comment: (NOTE) The GeneXpert MRSA Assay (FDA approved for NASAL specimens only), is one component of a comprehensive MRSA colonization surveillance program. It is not intended to diagnose MRSA infection nor to guide or monitor  treatment for MRSA infections. Test performance is not FDA approved in patients less than 55 years old. Performed at [facility], [place] [address], [place], [place] [address]   Body fluid culture w Gram Stain     Status: None   Collection Time: 01/15/22  5:59 PM   Specimen: Synovial Fluid  Result Value Ref Range Status   Specimen Description FLUID SYNOVIAL KNEE LEFT  Final   Special Requests NONE  Final   Gram Stain   Final    ABUNDANT WBC PRESENT, PREDOMINANTLY PMN NO ORGANISMS SEEN    Culture   Final    NO GROWTH 3 DAYS Performed at [facility], [place] [address], [place], [place] [address]    Report Status 01/18/2022 FINAL  Final  Anaerobic culture w Gram  Stain     Status: None   Collection Time: 01/15/22  5:59 PM   Specimen: Synovial Fluid  Result Value Ref Range Status   Specimen Description FLUID SYNOVIAL KNEE LEFT  Final   Special Requests NONE  Final   Gram Stain   Final    ABUNDANT WBC PRESENT, PREDOMINANTLY PMN NO ORGANISMS SEEN    Culture   Final    NO ANAEROBES ISOLATED Performed at [facility], [place] [address], [place], [place] [address]    Report Status 01/20/2022 FINAL  Final  Body fluid culture w Gram Stain     Status: None   Collection Time: 01/16/22 11:48 AM   Specimen: Synovium; Body Fluid  Result Value Ref Range Status   Specimen Description SYNOVIAL  Final   Special Requests  RIGHT KNEE  Final   Gram Stain   Final    ABUNDANT WBC PRESENT, PREDOMINANTLY PMN NO ORGANISMS SEEN    Culture   Final    NO GROWTH 3 DAYS Performed at [facility], [address] [place], [place] [address]    Report Status 01/19/2022 FINAL  Final    RADIOLOGY STUDIES/RESULTS: No results found.   LOS: 8 days   Oren Binet, MD  [facility]    To contact the attending provider between 7A-7P or the covering provider during after hours 7P-7A, please log into the web site [email or web address] and access using universal [place] password for that web site. If you do not have the password, please call the hospital operator.  01/23/2022, 11:21 AM

## 2022-01-23 NOTE — Progress Notes (Signed)
Occupational Therapy Treatment Patient Details Name: [name] MRN: [id number] DOB: 1955/09/30 Today's Date: 01/23/2022   History of present illness 66 yo male presents to ED on 8/14 with hypoglycemia, AMS, recent fall, POD6 L AV fistula placement. CTH negative, FOBT +. s/p R and L knee aspiration on 8/14 and 8/15, respectively. Workup for SIRS due to acute gouty arthritis bilat knees. PMH includes CKD stage V, DM-2 with retinopathy and neuropathy, chronic HFpEF, HTN, HLD, prostate cancer, cardiomyopathy.   OT comments  Pt was able to complete bed mobility with max x2 and attempted to complete sit to stand with steady with total/max x2 assist but was unable to complete at this time. Pt was able to allow this reporting therapist to provide gentle AAROM/AROM to LUE at this time as pre nursing reported they would not allowed to have any touching due to pain. Pt does required max cues on breathing to decrease in guarding of LUE. Pt currently with functional limitations due to the deficits listed below (see OT Problem List).  Pt will benefit from skilled OT to increase their safety and independence with ADL and functional mobility for ADL to facilitate discharge to venue listed below.     Recommendations for follow up therapy are one component of a multi-disciplinary discharge planning process, led by the attending physician.  Recommendations may be updated based on patient status, additional functional criteria and insurance authorization.    Follow Up Recommendations  Skilled nursing-short term rehab (<3 hours/day)    Assistance Recommended at Discharge Frequent or constant Supervision/Assistance  Patient can return home with the following  Two people to help with bathing/dressing/bathroom;Assistance with cooking/housework;Direct supervision/assist for medications management;Direct supervision/assist for financial management;Assist for transportation;Help with stairs or ramp for entrance;Two  people to help with walking and/or transfers   Equipment Recommendations  Other (comment) (TBD at next level of care)    Recommendations for Other Services      Precautions / Restrictions Precautions Precautions: Fall Precaution Comments: R IJ catheter Restrictions Weight Bearing Restrictions: No       Mobility Bed Mobility Overal bed mobility: Needs Assistance Bed Mobility: Supine to Sit, Rolling, Sit to Supine Rolling: Max assist, +2 for physical assistance, +2 for safety/equipment   Supine to sit: Max assist, +2 for physical assistance, +2 for safety/equipment Sit to supine: Max assist, +2 for physical assistance, +2 for safety/equipment        Transfers                   General transfer comment: Attmepted to complete with tital/max x2 with steady but unable to complete     Balance Overall balance assessment: Needs assistance Sitting-balance support: Feet supported, Single extremity supported Sitting balance-Leahy Scale: Fair Sitting balance - Comments: Pt required min-mod but then supervision Postural control: Posterior lean                                 ADL either performed or assessed with clinical judgement   ADL Overall ADL's : Needs assistance/impaired Eating/Feeding: Set up;Bed level   Grooming: Moderate assistance;Cueing for safety;Cueing for sequencing;Sitting   Upper Body Bathing: Cueing for safety;Cueing for sequencing;Bed level;Maximal assistance   Lower Body Bathing: Total assistance;Bed level;+2 for physical assistance;+2 for safety/equipment   Upper Body Dressing : Maximal assistance;Cueing for safety;Cueing for sequencing;Sitting   Lower Body Dressing: Total assistance;+2 for physical assistance;+2 for safety/equipment;Sit to/from stand;Bed level  General ADL Comments: Attempted to complete sit to stand with steady with total x2 but unable to complete    Extremity/Trunk Assessment Upper  Extremity Assessment Upper Extremity Assessment: LUE deficits/detail RUE Deficits / Details: edema and general edema LUE Deficits / Details: Pt was provided with gentle PROM/AROM as guarding LUE shoulder. Pt required cues to take a breath and relaxing LUE while increase in ROM. Pt was able to complete about 30 degrees shoulder flexion, 25 degrees shoulder abduction, elbow extension 150 degrees and elbow flexion 80 degrees. Pt had trace digital flexion/extension LUE: Unable to fully assess due to pain LUE Sensation: decreased light touch LUE Coordination: decreased fine motor;decreased gross motor   Lower Extremity Assessment Lower Extremity Assessment: Defer to PT evaluation        Vision       Perception     Praxis      Cognition Arousal/Alertness: Awake/alert Behavior During Therapy: WFL for tasks assessed/performed Overall Cognitive Status: Impaired/Different from baseline Area of Impairment: Safety/judgement, Awareness, Problem solving, Following commands                     Memory: Decreased short-term memory Following Commands: Follows one step commands with increased time Safety/Judgement: Decreased awareness of safety, Decreased awareness of deficits   Problem Solving: Slow processing, Requires verbal cues, Requires tactile cues General Comments: Pt noted to increase in level of awareness in today's session but could not recall the last 2-3 days of events        Exercises Exercises: General Upper Extremity General Exercises - Upper Extremity Shoulder Flexion: Left, AROM, AAROM, PROM, 5 reps Shoulder Extension: Left, PROM, AROM, AAROM, 5 reps Elbow Flexion: PROM, AROM, AAROM, Left, 5 reps Elbow Extension: PROM, AROM, Left, 5 reps Digit Composite Flexion: AROM, Left, 5 reps Composite Extension: Left, PROM, AROM, AAROM    Shoulder Instructions       General Comments      Pertinent Vitals/ Pain       Pain Assessment Pain Assessment: Faces Faces Pain  Scale: Hurts whole lot Pain Location: B knees/ L arm Pain Descriptors / Indicators: Sore, Discomfort, Grimacing, Guarding Pain Intervention(s): Limited activity within patient's tolerance, Monitored during session, Repositioned  Home Living                                          Prior Functioning/Environment              Frequency  Min 2X/week        Progress Toward Goals  OT Goals(current goals can now be found in the care plan section)  Progress towards OT goals: Progressing toward goals  Acute Rehab OT Goals OT Goal Formulation: With patient Time For Goal Achievement: 01/23/22 Potential to Achieve Goals: Good ADL Goals Pt Will Perform Grooming: with set-up;sitting Pt Will Perform Upper Body Bathing: with min assist;sitting Pt Will Perform Upper Body Dressing: with min assist;sitting Pt Will Transfer to Toilet: with mod assist;stand pivot transfer;bedside commode Pt/caregiver will Perform Home Exercise Program: Left upper extremity;Increased ROM;Increased strength;With minimal assist Additional ADL Goal #1: Pt will perform bed mobility with min assist in preparation for ADLs. Additional ADL Goal #2: Pt will follow two step commands with 75% accuracy.  Plan Discharge plan remains appropriate    Co-evaluation    PT/OT/SLP Co-Evaluation/Treatment: Yes     OT goals addressed during session: ADL's and  self-care      AM-PAC OT "6 Clicks" Daily Activity     Outcome Measure   Help from another person eating meals?: A Little Help from another person taking care of personal grooming?: A Little Help from another person toileting, which includes using toliet, bedpan, or urinal?: Total Help from another person bathing (including washing, rinsing, drying)?: Total Help from another person to put on and taking off regular upper body clothing?: A Lot Help from another person to put on and taking off regular lower body clothing?: Total 6 Click Score:  11    End of Session Equipment Utilized During Treatment: Gait belt  OT Visit Diagnosis: Pain;Muscle weakness (generalized) (M62.81);Other symptoms and signs involving cognitive function Pain - Right/Left: Left (LUE and BLE) Pain - part of body: Arm;Knee   Activity Tolerance Patient limited by pain   Patient Left in bed;with call bell/phone within reach;with bed alarm set;with family/visitor present   Nurse Communication Mobility status (edema in LUE with saftey bands)        Time: 1030-1109 OT Time Calculation (min): 39 min  Charges: OT General Charges $OT Visit: 1 Visit OT Treatments $Self Care/Home Management : 8-22 mins $Therapeutic Exercise: 8-22 mins  Joeseph Amor OTR/L  Acute Rehab Services  [phone] office number [phone] pager number   Joeseph Amor 01/23/2022, 11:51 AM

## 2022-01-24 ENCOUNTER — Encounter (HOSPITAL_COMMUNITY): Payer: Self-pay | Admitting: Internal Medicine

## 2022-01-24 ENCOUNTER — Inpatient Hospital Stay (HOSPITAL_COMMUNITY): Payer: Medicare Other

## 2022-01-24 DIAGNOSIS — G9340 Encephalopathy, unspecified: Secondary | ICD-10-CM | POA: Diagnosis not present

## 2022-01-24 DIAGNOSIS — M109 Gout, unspecified: Secondary | ICD-10-CM | POA: Diagnosis not present

## 2022-01-24 DIAGNOSIS — D72829 Elevated white blood cell count, unspecified: Secondary | ICD-10-CM | POA: Diagnosis not present

## 2022-01-24 DIAGNOSIS — R4182 Altered mental status, unspecified: Secondary | ICD-10-CM | POA: Diagnosis not present

## 2022-01-24 DIAGNOSIS — A419 Sepsis, unspecified organism: Secondary | ICD-10-CM | POA: Diagnosis not present

## 2022-01-24 DIAGNOSIS — G9341 Metabolic encephalopathy: Secondary | ICD-10-CM | POA: Diagnosis not present

## 2022-01-24 HISTORY — PX: IR FLUORO GUIDE CV LINE RIGHT: IMG2283

## 2022-01-24 LAB — RENAL FUNCTION PANEL
Albumin: 1.6 g/dL — ABNORMAL LOW (ref 3.5–5.0)
Anion gap: 10 (ref 5–15)
BUN: 95 mg/dL — ABNORMAL HIGH (ref 8–23)
CO2: 30 mmol/L (ref 22–32)
Calcium: 8.2 mg/dL — ABNORMAL LOW (ref 8.9–10.3)
Chloride: 95 mmol/L — ABNORMAL LOW (ref 98–111)
Creatinine, Ser: 6.35 mg/dL — ABNORMAL HIGH (ref 0.61–1.24)
GFR, Estimated: 9 mL/min — ABNORMAL LOW (ref >60)
Glucose, Bld: 72 mg/dL (ref 70–99)
Phosphorus: 6.1 mg/dL — ABNORMAL HIGH (ref 2.5–4.6)
Potassium: 3.7 mmol/L (ref 3.5–5.1)
Sodium: 135 mmol/L (ref 135–145)

## 2022-01-24 LAB — CBC
HCT: 26.5 % — ABNORMAL LOW (ref 39.0–52.0)
Hemoglobin: 8.5 g/dL — ABNORMAL LOW (ref 13.0–17.0)
MCH: 27.9 pg (ref 26.0–34.0)
MCHC: 32.1 g/dL (ref 30.0–36.0)
MCV: 86.9 fL (ref 80.0–100.0)
Platelets: 243 10*3/uL (ref 150–400)
RBC: 3.05 MIL/uL — ABNORMAL LOW (ref 4.22–5.81)
RDW: 15.8 % — ABNORMAL HIGH (ref 11.5–15.5)
WBC: 17.5 10*3/uL — ABNORMAL HIGH (ref 4.0–10.5)
nRBC: 0 % (ref 0.0–0.2)

## 2022-01-24 LAB — GLUCOSE, CAPILLARY
Glucose-Capillary: 75 mg/dL (ref 70–99)
Glucose-Capillary: 126 mg/dL — ABNORMAL HIGH (ref 70–99)
Glucose-Capillary: 74 mg/dL (ref 70–99)
Glucose-Capillary: 79 mg/dL (ref 70–99)

## 2022-01-24 MED ORDER — CEFAZOLIN SODIUM-DEXTROSE 2-4 GM/100ML-% IV SOLN
INTRAVENOUS | Status: AC | PRN
  Administered 2022-01-24: 2 g via INTRAVENOUS

## 2022-01-24 MED ORDER — HYDROMORPHONE HCL 1 MG/ML IJ SOLN
0.5000 mg | Freq: Once | INTRAMUSCULAR | Status: AC
  Administered 2022-01-24: 0.5 mg via INTRAVENOUS
  Filled 2022-01-24: qty 0.5

## 2022-01-24 MED ORDER — LIDOCAINE-EPINEPHRINE 1 %-1:100000 IJ SOLN
INTRAMUSCULAR | Status: AC
  Filled 2022-01-24: qty 1

## 2022-01-24 MED ORDER — INSULIN DETEMIR 100 UNIT/ML ~~LOC~~ SOLN
26.0000 [IU] | Freq: Two times a day (BID) | SUBCUTANEOUS | Status: DC
  Administered 2022-01-24: 26 [IU] via SUBCUTANEOUS
  Filled 2022-01-24 (×3): qty 0.26

## 2022-01-24 MED ORDER — FENTANYL CITRATE (PF) 100 MCG/2ML IJ SOLN
INTRAMUSCULAR | Status: AC
  Filled 2022-01-24: qty 2

## 2022-01-24 MED ORDER — HEPARIN SODIUM (PORCINE) 1000 UNIT/ML IJ SOLN
INTRAMUSCULAR | Status: AC
  Filled 2022-01-24: qty 10

## 2022-01-24 MED ORDER — CEFAZOLIN SODIUM-DEXTROSE 2-4 GM/100ML-% IV SOLN
INTRAVENOUS | Status: AC
  Filled 2022-01-24: qty 100

## 2022-01-24 MED ORDER — CEFAZOLIN SODIUM-DEXTROSE 2-4 GM/100ML-% IV SOLN: 2.0000 g | INTRAVENOUS | Status: DC

## 2022-01-24 MED ORDER — MIDAZOLAM HCL 2 MG/2ML IJ SOLN
INTRAMUSCULAR | Status: AC
  Filled 2022-01-24: qty 2

## 2022-01-24 MED ORDER — FENTANYL CITRATE (PF) 100 MCG/2ML IJ SOLN
INTRAMUSCULAR | Status: AC | PRN
  Administered 2022-01-24: 50 ug via INTRAVENOUS
  Administered 2022-01-24: 25 ug via INTRAVENOUS

## 2022-01-24 MED ORDER — LIDOCAINE-EPINEPHRINE 1 %-1:100000 IJ SOLN
INTRAMUSCULAR | Status: AC | PRN
  Administered 2022-01-24: 10 mL

## 2022-01-24 MED ORDER — INSULIN ASPART 100 UNIT/ML IJ SOLN: 8.0000 [IU] | Freq: Three times a day (TID) | INTRAMUSCULAR | Status: DC

## 2022-01-24 MED ORDER — MIDAZOLAM HCL 2 MG/2ML IJ SOLN
INTRAMUSCULAR | Status: AC | PRN
  Administered 2022-01-24: .5 mg via INTRAVENOUS
  Administered 2022-01-24: 1 mg via INTRAVENOUS

## 2022-01-24 NOTE — Progress Notes (Signed)
Nutrition Follow-up  DOCUMENTATION CODES:   Not applicable  INTERVENTION:   Continue Nepro Shake po BID, each supplement provides 425 kcal and 19 grams protein Continue Renal Multivitamin w/ minerals daily Encourage good PO intake   NUTRITION DIAGNOSIS:   Increased nutrient needs related to chronic illness (Cancer, CHF, ESRD) as evidenced by estimated needs. - Ongoing  GOAL:   Patient will meet greater than or equal to 90% of their needs  - Ongoing  MONITOR:   PO intake, Supplement acceptance, I & O's, Labs, Weight trends  REASON FOR ASSESSMENT:   Consult Diet education  ASSESSMENT:   66 y.o. male presented to the ED with confusion and hypoglycemia. PMH includes CKD V, HTN, Prostate cancer, T2DM, CHF, and GERD. Pt admitted with sepsis and acute metabolic encephalopathy.   8/17 - Temporary HD catheter placed 8/23 - Tunneled HD cath placed   Pt out of room, in procedure at time of RD visit. RD spoke with pt son. Son reports that pt PO intake has improved and that he is eating well. Reports that he has been drinking the nutrition shakes and likes them. RD discussed that ONS may be beneficial to continue due to outpatient HD can often include missing a meal.  Meal completions: 0-100% x 8 meals (average 37.5%)  Son with no questions or concerns at this time.   Pt may benefit from a phosphorus binder due to ongoing elevated Phosphorus levels.  Medications reviewed and include: Calcitriol, NovoLog SSI, Novolog, Levemir, Rena-vit, Protonix, Prednisone Labs reviewed: Phosphorus 6.1, 24 hr CBG 74-340  HD on 8/22 Net UF: 2 L  Diet Order:   Diet Order             Diet renal/carb modified with fluid restriction Diet-HS Snack? Nothing; Fluid restriction: 1200 mL Fluid; Room service appropriate? Yes; Fluid consistency: Thin  Diet effective now                   EDUCATION NEEDS:   Education needs have been addressed  Skin:  Skin Assessment: Reviewed RN  Assessment  Last BM:  8/21  Height:   Ht Readings from Last 1 Encounters:  01/15/22 '5\' 11"'$  (1.803 m)    Weight:   Wt Readings from Last 1 Encounters:  01/24/22 126.5 kg    Ideal Body Weight:  78.2 kg  BMI:  Body mass index is 38.9 kg/m.  Estimated Nutritional Needs:   Kcal:  2200-2400  Protein:  110-125 grams  Fluid:  UOP + 1L    Hermina Barters RD, LDN Clinical Dietitian See Shea Evans for contact information.

## 2022-01-24 NOTE — Procedures (Signed)
Pre-procedure Diagnosis: ESRD Post-procedure Diagnosis: Same  Successful conversion of temporary to tunneled HD catheter with tips terminating within the superior aspect of the right atrium.    Complications: None Immediate EBL: Minimal   The catheter is ready for immediate use.   Ronny Bacon, MD Pager #: [phone]

## 2022-01-24 NOTE — Progress Notes (Signed)
[facility] Progress Note   Assessment/ Plan:    AoCKD5, now ESRD  s/p L BC AVF 01/09/22 superficialization, not ready for use Has Temp HD cath WBC improving slowly on steroids; ID notes reviewed CLIP in process Will go ahead and order for [facility] with IR Started HD 8/17 Cont on THS schedule: 2K 4h, 400 Qb, 2-3L UF, no heparin   2.  L knee arthritis, probably gout  - per primary  - ID following   3.  Hyperkalemia             - off Lokelma, K stable   4.  Hyponatremia:             - resolved   5.  Elevated LFTs:             - improved  6.  Encephalopathy:  - combination likely of uremia/ sepsis  - Improving  7.  Afib with RVR:  -s/p dilt gtt--> coreg  - Eliquis on hold  8. Bloody stools / hematochezia; GI FOllowing; EGD/CSY when more stable   9.  Dispo: SNF when DC  10. Leukocytosis, steroids related  Subjective:    HD yesterday 2L UF, tolerated well Labs ok here today MOre awake, engaged, and appropriate toda Family at bedside     Objective:   BP (!) 152/84 (BP Location: Right Arm)   Pulse 77   Temp 97.9 F (36.6 C) (Oral)   Resp 16   Ht '5\' 11"'$  (1.803 m)   Wt 126.5 kg   SpO2 93%   BMI 38.90 kg/m   Intake/Output Summary (Last 24 hours) at 01/24/2022 1142 Last data filed at 01/23/2022 1550 Gross per 24 hour  Intake --  Output 2000 ml  Net -2000 ml    Weight change: -1.1 kg  Physical Exam: GEN NAD, lying in bed, appears uncomfortable, yelling out HEENT EOMI PERRL NECK NO JVD PULM  clear CV RRR  ABD + pannus EXT no LE edema NEURO able to respond but doesn't always make sense MSK L knee tenderness ACCESS L AVF + T/B, healing well, R IJ nontunneled HD catheter with no blood  Imaging: No results found.  Labs: BMET Recent Labs  Lab 01/18/22 0339 01/19/22 0321 01/20/22 0343 01/20/22 0820 01/21/22 0258 01/22/22 0405 01/23/22 0450 01/24/22 0841  NA 129* 130* 133* 133* 130* 135 133* 135  K 4.8 3.9 4.5 4.2 3.9 4.0 4.2 3.7  CL  93* 92* 93* 95* 92* 95* 92* 95*  CO2 17* 19* 20* 19* '22 22 22 30  '$ GLUCOSE 360* 255* 178* 159* 233* 181* 304* 72  BUN 170* 143* 177* 177* 140* 110* 140* 95*  CREATININE 9.54* 7.97* 10.06* 9.55* 8.00* 6.83* 7.77* 6.35*  CALCIUM 7.1* 7.6* 8.3* 7.9* 7.8* 8.1* 8.5* 8.2*  PHOS 10.1*  --   --  10.2*  --  7.7*  --  6.1*    CBC Recent Labs  Lab 01/22/22 1643 01/23/22 0450 01/23/22 1834 01/24/22 0415  WBC 19.7* 19.2* 18.5* 17.5*  HGB 8.3* 8.7* 8.6* 8.5*  HCT 26.0* 26.0* 26.1* 26.5*  MCV 87.2 86.1 86.4 86.9  PLT 168 196 217 243     Medications:     sodium chloride   Intravenous Once   sodium chloride   Intravenous Once   abiraterone acetate  1,000 mg Oral Daily   acetaminophen  650 mg Oral Once   calcitRIOL  0.25 mcg Oral Daily   carvedilol  25 mg Oral BID WC  Chlorhexidine Gluconate Cloth  6 each Topical Q0600   diphenhydrAMINE  25 mg Intravenous Once   feeding supplement (NEPRO CARB STEADY)  237 mL Oral BID BM   insulin aspart  0-9 Units Subcutaneous TID WC   insulin aspart  10 Units Subcutaneous TID WC   insulin detemir  30 Units Subcutaneous BID   multivitamin  1 tablet Oral QHS   pantoprazole  40 mg Oral BID   predniSONE  10 mg Oral Q breakfast   sodium chloride flush  10-40 mL Intracatheter Q12H   sodium chloride flush  3 mL Intravenous Q12H   sodium chloride flush  3 mL Intravenous Q12H    Madelon Lips, MD 01/24/2022, 11:42 AM

## 2022-01-24 NOTE — Consult Note (Signed)
Chief Complaint: Patient was seen in consultation today for image guided tunneled dialysis catheter placement Chief Complaint  Patient presents with   Altered Mental Status   at the request of Pearson Grippe   Referring Physician(s): Pearson Grippe   Supervising Physician: Sandi Mariscal  Patient Status: [facility] - In-pt  History of Present Illness: [name] is a 66 y.o. 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   Patient has long history of CKD and has been followed by nephrology, he underwent left BC AVF creation on 06/06/21 and superficialization on 01/09/22. He was brought  to ED on 8/14 due to confusion and hypoglycemia,  admitted for further eval and management of SIRS, metabolic encephalopathy, and hypoglycemia.   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 Blood cx obtained on 8/14 showed no growth x 5 days.  Nephrology has been following the patient during this admission, and patient underwent temporary dialysis catheter placement with Dr. Serafina Royals on 8/17 and has been receiving dialysis. Patient has been stabilized and anticipating discharge to SNF.   IR was requested for image guided placement/conversion of tunneled dialysis catheter placement.   Patient still mildly confused, ROS not obtained.  States that he is very hungry.   Past Medical History:  Diagnosis Date   Arthritis    Cardiomyopathy    Resolved with EF 55% 2011   CARDIOMYOPATHY 04/22/2006   CARDIOMYOPATHY 04/22/2006   2- D Echo (04/2010) : The EF is probably 55% with some hypokinesis at the base of the inferior wall. Wall thickness was increased in a pattern of mild LVH. Patient followed by Dr Martinique (Cardiology)      CHF (congestive heart failure) ([place])    Chronic kidney disease    Diabetes mellitus    DIABETIC  RETINOPATHY 02/06/2007   GERD  (gastroesophageal reflux disease)    Hyperlipemia    Hypertension    Iron deficiency 04/09/2018   NEPHROPATHY, DIABETIC 06/08/2006   Paroxysmal atrial tachycardia ([place])     Past Surgical History:  Procedure Laterality Date   AV FISTULA PLACEMENT Left 06/06/2021   Procedure: LEFT BRACHIOCEPHALIC ARTERIOVENOUS (AV) FISTULA CREATION;  Surgeon: Broadus John, MD;  Location: [facility];  Service: Vascular;  Laterality: Left;  PERIPHERAL NERVE BLOCK   CARDIAC CATHETERIZATION  Dec 2007   normal; EF- 20-25%   FISTULA SUPERFICIALIZATION Left 01/09/2022   Procedure: LEFT BRACHIOCEPHALIC FISTULA SUPERFICIALIZATION;  Surgeon: Broadus John, MD;  Location: [facility];  Service: Vascular;  Laterality: Left;  PERIPHERAL NERVE BLOCK   IR FLUORO GUIDE CV LINE RIGHT  01/18/2022   IR US GUIDE VASC ACCESS RIGHT  01/18/2022    Allergies: Patient has no known allergies.  Medications: Prior to Admission medications   Medication Sig Start Date End Date Taking? Authorizing Provider  abiraterone acetate (ZYTIGA) 250 MG tablet Take 1,000 mg by mouth daily. 09/21/21  Yes [provider]  acetaminophen (TYLENOL) 500 MG tablet Take 500-1,000 mg by mouth every 6 (six) hours as needed for moderate pain.   Yes [provider]  aspirin 81 MG tablet Take 1 tablet (81 mg total) by mouth daily. 12/19/15  Yes Debbrah Alar, NP  atorvastatin (LIPITOR) 80 MG tablet Take 1 tablet (80 mg total) by mouth daily. 03/02/19  Yes Katherine Roan, MD  calcitRIOL (ROCALTROL) 0.25 MCG capsule Take 0.25 mcg  by mouth daily. 11/01/20  Yes [provider]  carvedilol (COREG) 25 MG tablet TAKE 1 TABLET BY MOUTH TWICE DAILY WITH A MEAL 05/27/19  Yes Katherine Roan, MD  furosemide (LASIX) 80 MG tablet Take 80 mg by mouth 2 (two) times daily. 12/09/19  Yes [provider]  glipiZIDE (GLUCOTROL) 10 MG tablet Take 10 mg by mouth 2 (two) times daily. 07/22/20  Yes [provider]  hydrALAZINE  (APRESOLINE) 50 MG tablet Take 1.5 tablets (75 mg total) by mouth 3 (three) times daily. Patient taking differently: Take 50 mg by mouth in the morning and at bedtime. 05/04/19  Yes Jeanmarie Hubert, MD  INSULIN LISPRO, 1 UNIT DIAL, [place] Inject 15 Units into the skin 3 (three) times daily. 200 units/ml 05/20/19  Yes [provider]  LEVEMIR 100 UNIT/ML injection Inject 40 Units into the skin 2 (two) times daily. 08/23/20  Yes [provider]  oxyCODONE (OXY IR/ROXICODONE) 5 MG immediate release tablet Take 1 tablet (5 mg total) by mouth every 6 (six) hours as needed (for pain score of 1-4). 01/09/22  Yes Ulyses Amor, PA-C  predniSONE (DELTASONE) 5 MG tablet Take 5 mg by mouth daily. 12/25/21  Yes [provider]  BAYER MICROLET LANCETS lancets Check blood sugar 3 times a day as instructed 10/03/17   Katherine Roan, MD  Blood Glucose Monitoring Suppl (ONE TOUCH ULTRA MINI) w/Device KIT 1 strip by Other route 3 (three) times daily. 11/24/15   Corky Sox, MD  glucose blood (ONE TOUCH ULTRA TEST) test strip The patient is insulin requiring, ICD 10 code E11.65. The patient tests 3 times per day. 05/16/18   Forde Dandy, PharmD  Insulin Pen Needle 31G X 6 MM MISC 1 Stick by Other route 3 (three) times daily. Use with insulin and liraglutide. 10/03/17   Katherine Roan, MD  metFORMIN (GLUCOPHAGE) 500 MG tablet Take 1 tablet (500 mg total) by mouth 2 (two) times daily with a meal. Patient not taking: Reported on 06/05/2021 05/06/18 05/06/19  Katherine Roan, MD  insulin aspart protamine - aspart (NOVOLOG MIX 70/30 FLEXPEN) (70-30) 100 UNIT/ML FlexPen Inject 60U into the skin in the morning with breakfast and in the evening 50U with dinner 09/29/18 03/02/19  Katherine Roan, MD     Family History  Problem Relation Age of Onset   Diabetes Mother    Diabetes Brother     Social History   Socioeconomic History   Marital status: Single    Spouse name: Not on file   Number  of children: Not on file   Years of education: Not on file   Highest education level: Not on file  Occupational History   Not on file  Tobacco Use   Smoking status: Never    Passive exposure: Never   Smokeless tobacco: Never  Vaping Use   Vaping Use: Never used  Substance and Sexual Activity   Alcohol use: No    Alcohol/week: 0.0 standard drinks of alcohol   Drug use: No   Sexual activity: Not on file  Other Topics Concern   Not on file  Social History Narrative   Not on file   Social Determinants of Health   Financial Resource Strain: Not on file  Food Insecurity: Not on file  Transportation Needs: Not on file  Physical Activity: Not on file  Stress: Not on file  Social Connections: Not on file     Review of Systems: A 12  point ROS discussed and pertinent positives are indicated in the HPI above.  All other systems are negative.  Vital Signs: BP 124/70 (BP Location: Right Arm)   Pulse 77   Temp 98.7 F (37.1 C) (Oral)   Resp 16   Ht 5' 11"  (1.803 m)   Wt 278 lb 14.1 oz (126.5 kg)   SpO2 93%   BMI 38.90 kg/m    Physical Exam Vitals reviewed.  Constitutional:      General: He is not in acute distress.    Appearance: Normal appearance.  HENT:     Mouth/Throat:     Mouth: Mucous membranes are moist.     Pharynx: Oropharynx is clear.  Neck:     Comments: + R IJ temp  Cardiovascular:     Rate and Rhythm: Normal rate and regular rhythm.     Heart sounds: Normal heart sounds.  Pulmonary:     Effort: Pulmonary effort is normal.     Breath sounds: Normal breath sounds.  Abdominal:     General: Abdomen is flat.     Palpations: Abdomen is soft.  Musculoskeletal:     Cervical back: Neck supple.  Skin:    General: Skin is warm and dry.     Coloration: Skin is not jaundiced or pale.  Neurological:     Mental Status: He is alert. Mental status is at baseline.  Psychiatric:        Behavior: Behavior normal.     MD Evaluation Airway: WNL Heart:  WNL Abdomen: WNL Chest/ Lungs: WNL ASA  Classification: 3 Mallampati/Airway Score: Two  Imaging: IR Fluoro Guide CV Line Right  Result Date: 01/18/2022 INDICATION: 66 year old male with worsening end-stage renal failure requiring initiation of hemodialysis. Temporary hemodialysis catheter requested. EXAM: NON-TUNNELED CENTRAL VENOUS HEMODIALYSIS CATHETER PLACEMENT WITH ULTRASOUND AND FLUOROSCOPIC GUIDANCE COMPARISON:  None Available. MEDICATIONS: None FLUOROSCOPY TIME:  One mGy COMPLICATIONS: None immediate. PROCEDURE: Informed written consent was obtained from the patient after a discussion of the risks, benefits, and alternatives to treatment. Questions regarding the procedure were encouraged and answered. The right neck and chest were prepped with chlorhexidine in a sterile fashion, and a sterile drape was applied covering the operative field. Maximum barrier sterile technique with sterile gowns and gloves were used for the procedure. A timeout was performed prior to the initiation of the procedure. After the overlying soft tissues were anesthetized, a small venotomy incision was created and a micropuncture kit was utilized to access the internal jugular vein. Real-time ultrasound guidance was utilized for vascular access including the acquisition of a permanent ultrasound image documenting patency of the accessed vessel. The microwire was utilized to measure appropriate catheter length. A Rosen wire was advanced to the level of the IVC. Under fluoroscopic guidance, the venotomy was serially dilated, ultimately allowing placement of a 20 cm temporary Trialysis catheter with tip ultimately terminating within the superior aspect of the right atrium. Final catheter positioning was confirmed and documented with a spot radiographic image. The catheter aspirates and flushes normally. The catheter was flushed with appropriate volume heparin dwells. The catheter exit site was secured with a 0 silk retention  suture. A dressing was placed. The patient tolerated the procedure well without immediate post procedural complication. IMPRESSION: Successful placement of a right internal jugular approach 20 cm temporary dialysis catheter with tip terminating with in the superior aspect of the right atrium. The catheter is ready for immediate use. PLAN: This catheter may be converted to a tunneled dialysis catheter  at a later date as indicated. Ruthann Cancer, MD [facility] Electronically Signed   By: Ruthann Cancer M.D.   On: 01/18/2022 10:04   US Abdomen Limited RUQ (LIVER/GB)  Result Date: 01/17/2022 CLINICAL DATA:  Elevated LFTs EXAM: ULTRASOUND ABDOMEN LIMITED RIGHT UPPER QUADRANT COMPARISON:  None Available. FINDINGS: Gallbladder: No gallstones or wall thickening visualized. No sonographic Murphy sign noted by sonographer. Common bile duct: Diameter: 3.7 mm Liver: No focal lesion identified. Within normal limits in parenchymal echogenicity. Portal vein is patent on color Doppler imaging with normal direction of blood flow towards the liver. Other: None. IMPRESSION: Normal right upper quadrant ultrasound. Electronically Signed   By: Ronney Asters M.D.   On: 01/17/2022 19:30   ECHOCARDIOGRAM COMPLETE  Result Date: 01/17/2022    ECHOCARDIOGRAM REPORT   Patient Name:   [name] Date of Exam: 01/17/2022 Medical Rec #:  [id number]      Height:       71.0 in Accession #:    [id number]     Weight:       272.9 lb Date of Birth:  06/23/1955       BSA:          2.406 m Patient Age:    33 years       BP:           123/65 mmHg Patient Gender: M              HR:           84 bpm. Exam Location:  Inpatient Procedure: 2D Echo, Cardiac Doppler, Color Doppler and Intracardiac            Opacification Agent Indications:    Abnormal CXR  History:        Patient has prior history of Echocardiogram examinations, most                 recent 04/20/2019. CHF; Risk  Factors:Hypertension, Diabetes and                 Dyslipidemia.  Sonographer:    Jefferey Pica Referring Phys: [phone] A CALDWELL POWELL JR IMPRESSIONS  1. Left ventricular ejection fraction, by estimation, is 55 to 60%. The left ventricle has normal function. The left ventricle has no regional wall motion abnormalities. There is mild concentric left ventricular hypertrophy. Left ventricular diastolic parameters were normal.  2. Right ventricular systolic function is normal. The right ventricular size is normal. There is normal pulmonary artery systolic pressure.  3. The mitral valve is normal in structure. Trivial mitral valve regurgitation. No evidence of mitral stenosis.  4. The aortic valve is tricuspid. Aortic valve regurgitation is not visualized. No aortic stenosis is present.  5. The inferior vena cava is normal in size with greater than 50% respiratory variability, suggesting right atrial pressure of 3 mmHg. FINDINGS  Left Ventricle: Left ventricular ejection fraction, by estimation, is 55 to 60%. The left ventricle has normal function. The left ventricle has no regional wall motion abnormalities. The left ventricular internal cavity size was normal in size. There is  mild concentric left ventricular hypertrophy. Left ventricular diastolic parameters were normal. Right Ventricle: The right ventricular size is normal. No increase in right ventricular wall thickness. Right ventricular systolic function is normal. There is normal pulmonary artery systolic pressure. The tricuspid regurgitant velocity is 2.74 m/s, and  with an assumed right atrial pressure of 5 mmHg, the estimated right ventricular systolic pressure  is 35.0 mmHg. Left Atrium: Left atrial size was normal in size. Right Atrium: Right atrial size was normal in size. Pericardium: There is no evidence of pericardial effusion. Mitral Valve: The mitral valve is normal in structure. Trivial mitral valve regurgitation. No evidence of mitral valve  stenosis. Tricuspid Valve: The tricuspid valve is normal in structure. Tricuspid valve regurgitation is trivial. No evidence of tricuspid stenosis. Aortic Valve: The aortic valve is tricuspid. Aortic valve regurgitation is not visualized. No aortic stenosis is present. Aortic valve peak gradient measures 8.9 mmHg. Pulmonic Valve: The pulmonic valve was not well visualized. Pulmonic valve regurgitation is not visualized. No evidence of pulmonic stenosis. Aorta: The aortic root is normal in size and structure. Venous: The inferior vena cava is normal in size with greater than 50% respiratory variability, suggesting right atrial pressure of 3 mmHg. IAS/Shunts: No atrial level shunt detected by color flow Doppler.  LEFT VENTRICLE PLAX 2D LVIDd:         5.50 cm   Diastology LVIDs:         3.70 cm   LV e' medial:    7.49 cm/s LV PW:         1.20 cm   LV E/e' medial:  13.0 LV IVS:        1.20 cm   LV e' lateral:   7.56 cm/s LVOT diam:     2.00 cm   LV E/e' lateral: 12.9 LV SV:         82 LV SV Index:   34 LVOT Area:     3.14 cm  RIGHT VENTRICLE             IVC RV S prime:     12.80 cm/s  IVC diam: 2.00 cm TAPSE (M-mode): 2.6 cm LEFT ATRIUM           Index        RIGHT ATRIUM           Index LA diam:      4.10 cm 1.70 cm/m   RA Area:     17.00 cm LA Vol (A4C): 68.8 ml 28.59 ml/m  RA Volume:   39.30 ml  16.33 ml/m  AORTIC VALVE                 PULMONIC VALVE AV Area (Vmax): 2.99 cm     PV Vmax:       0.89 m/s AV Vmax:        149.00 cm/s  PV Peak grad:  3.2 mmHg AV Peak Grad:   8.9 mmHg LVOT Vmax:      142.00 cm/s LVOT Vmean:     78.300 cm/s LVOT VTI:       0.260 m  AORTA Ao Root diam: 3.50 cm Ao Asc diam:  3.40 cm MITRAL VALVE               TRICUSPID VALVE MV Area (PHT): 7.09 cm    TR Peak grad:   30.0 mmHg MV Decel Time: 107 msec    TR Vmax:        274.00 cm/s MV E velocity: 97.30 cm/s MV A velocity: 47.70 cm/s  SHUNTS MV E/A ratio:  2.04        Systemic VTI:  0.26 m                            Systemic Diam: 2.00 cm  Godfrey Pick Tobb DO Electronically  signed by Berniece Salines DO Signature Date/Time: 01/17/2022/11:57:08 AM    Final    US RENAL  Result Date: 01/16/2022 CLINICAL DATA:  AKI. EXAM: RENAL / URINARY TRACT ULTRASOUND COMPLETE COMPARISON:  08/17/2021. FINDINGS: Right Kidney: Renal measurements: 10.5 x 5.4 x 4.8 cm = volume: 142.05 mL. Echogenicity within normal limits. No mass or hydronephrosis visualized. Left Kidney: Renal measurements: 10.3 x 5.9 x 4.6 cm = volume: 146.72 mL. Echogenicity within normal limits. An avascular hypoechoic region is noted in the mid left kidney measuring 2.5 x 1.7 cm, possible cyst. No mass or hydronephrosis visualized. Bladder: Appears normal for degree of bladder distention. Other: Examination is limited due to patient's body habitus and limited mobility. IMPRESSION: 1. Avascular hypoechoic structure in the mid left kidney, possible cyst. 2. Normal evaluation of the right kidney and bladder. Electronically Signed   By: Brett Fairy M.D.   On: 01/16/2022 20:16   MR BRAIN WO CONTRAST  Result Date: 01/16/2022 CLINICAL DATA:  Neuro deficit, acute, stroke suspected. EXAM: MRI HEAD WITHOUT CONTRAST TECHNIQUE: Multiplanar, multiecho pulse sequences of the brain and surrounding structures were obtained without intravenous contrast. COMPARISON:  01/15/2022 CT FINDINGS: Brain: Diffusion imaging does not show any acute or subacute infarction or other cause of restricted diffusion. There is generalized brain atrophy, advanced for age. Old small vessel infarctions affect the central pons. Few old small vessel cerebellar infarctions. Cerebral hemispheres show old infarctions of the left basal ganglia and radiating white matter tracts. Mild chronic small-vessel ischemic change seen elsewhere. No large vessel territory infarction. No mass, hemorrhage, hydrocephalus or extra-axial collection. Vascular: Major vessels at the base of the brain show flow. Skull and upper cervical spine: Negative  Sinuses/Orbits: Clear/normal Other: None IMPRESSION: No acute MR finding. Accelerated generalized atrophy. 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 Electronically Signed   By: Nelson Chimes M.D.   On: 01/16/2022 16:12   DG Hand 2 View Left  Result Date: 01/16/2022 CLINICAL DATA:  Pain and swelling after falling from bed. EXAM: LEFT HAND - 2 VIEW COMPARISON:  None Available. FINDINGS: Generalized regional swelling. No evidence fracture or dislocation. Small erosions are noted at several of the inter phalangeal joints which could be seen with erosive arthritis including erosive osteoarthritis. IMPRESSION: No acute finding other than soft tissue swelling. 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 Electronically Signed   By: Nelson Chimes M.D.   On: 01/16/2022 15:07   DG Chest 1 View  Result Date: 01/16/2022 CLINICAL DATA:  Sepsis. EXAM: CHEST  1 VIEW COMPARISON:  01/15/2022 FINDINGS: 0505 hours. The cardio pericardial silhouette is enlarged. Low volume film. Bibasilar atelectasis noted. There is pulmonary vascular congestion without overt pulmonary edema. Telemetry leads overlie the chest. IMPRESSION: Enlarged cardiopericardial silhouette with vascular congestion. Electronically Signed   By: Misty Stanley M.D.   On: 01/16/2022 06:00   DG Knee Complete 4 Views Right  Result Date: 01/15/2022 CLINICAL DATA:  Fall, pain and swelling, initial encounter. EXAM: RIGHT KNEE - COMPLETE 4+ VIEW COMPARISON:  None Available. FINDINGS: Large joint effusion. No fracture. Slight varus angulation of the knee joint with minimal lateral subluxation of the proximal tibia with respect to the distal femur. Marked loss of joint space in the medial compartment with subchondral sclerosis and osteophytosis. Additional osteophytosis in the medial and patellofemoral  compartments. IMPRESSION: 1. Moderate to large joint effusion.  No fracture. 2.  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 Electronically Signed   By: Lorin Picket M.D.   On: 01/15/2022 13:53   CT Head Wo Contrast  Result Date: 01/15/2022 CLINICAL DATA:  Fall off a bed on Friday. EXAM: CT HEAD WITHOUT CONTRAST TECHNIQUE: Contiguous axial images were obtained from the base of the skull through the vertex without intravenous contrast. RADIATION DOSE REDUCTION: This exam was performed according to the departmental dose-optimization program which includes automated exposure control, adjustment of the mA and/or kV according to patient size and/or use of iterative reconstruction technique. COMPARISON:  None Available. FINDINGS: Brain: There is no acute intracranial hemorrhage, extra-axial fluid collection, or acute infarct. There is mild volume loss primarily affecting the frontal lobes. The ventricles are normal in size. Gray-white differentiation is preserved. There is no mass lesion.  There is no mass effect or midline shift. Vascular: There is calcification of the bilateral cavernous ICAs. Skull: Normal. Negative for fracture or focal lesion. Sinuses/Orbits: The paranasal sinuses are clear. The globes and orbits are unremarkable. Other: None. IMPRESSION: No acute intracranial pathology. Electronically Signed   By: Valetta Mole M.D.   On: 01/15/2022 12:30   DG Chest Port 1 View  Result Date: 01/15/2022 CLINICAL DATA:  Questionable sepsis. EXAM: PORTABLE CHEST 1 VIEW COMPARISON:  Chest radiograph dated April 20, 2008 FINDINGS: The heart is mildly enlarged. Chronic elevation of the right hemidiaphragm. Lungs are otherwise clear without evidence of focal consolidation or large pleural effusion. IMPRESSION: 1.  Stable mild cardiomegaly. 2. Chronic elevation of the right hemidiaphragm. No evidence of pneumonia or pleural effusion. Electronically Signed   By: Keane Police D.O.   On:  01/15/2022 10:54    Labs:  CBC: Recent Labs    01/22/22 1643 01/23/22 0450 01/23/22 1834 01/24/22 0415  WBC 19.7* 19.2* 18.5* 17.5*  HGB 8.3* 8.7* 8.6* 8.5*  HCT 26.0* 26.0* 26.1* 26.5*  PLT 168 196 217 243    COAGS: Recent Labs    01/15/22 1028  INR 1.3*  APTT 51*    BMP: Recent Labs    01/21/22 0258 01/22/22 0405 01/23/22 0450 01/24/22 0841  NA 130* 135 133* 135  K 3.9 4.0 4.2 3.7  CL 92* 95* 92* 95*  CO2 22 22 22 30   GLUCOSE 233* 181* 304* 72  BUN 140* 110* 140* 95*  CALCIUM 7.8* 8.1* 8.5* 8.2*  CREATININE 8.00* 6.83* 7.77* 6.35*  GFRNONAA 7* 8* 7* 9*    LIVER FUNCTION TESTS: Recent Labs    01/19/22 0321 01/20/22 0343 01/20/22 0820 01/21/22 0258 01/22/22 0405 01/23/22 0450 01/24/22 0841  BILITOT 0.9 0.8  --  1.0  --  0.6  --   AST 345* 149*  --  75*  --  35  --   ALT 129* 95*  --  72*  --  48*  --   ALKPHOS 133* 94  --  83  --  81  --   PROT 6.1* 5.8*  --  5.5*  --  5.9*  --   ALBUMIN 1.6* 1.6*   < > 1.6* 1.6* 1.7* 1.6*   < > = values in this interval not displayed.    TUMOR MARKERS: No results for input(s): "AFPTM", "CEA", "CA199", "CHROMGRNA" in the last 8760 hours.  Assessment and Plan: 66 y.o. male with ESRD who is in need of long term hemdialysis access.   VSS WBC 17.5 and trending down, hgb stable 8.5, plt 243  Blood cx on 8/14 NG x 5 days  Bilat knee aspiration neg for septic arthritis  Had some clear liquid around 10 am per RN. IR team informed.  Ancef 2g ordered   Risks and benefits discussed with the patient's son including, but not limited to bleeding, infection, vascular injury, pneumothorax which may require chest tube placement, air embolism or even death  All of the son questions were answered, patient is agreeable to proceed. Consent signed and in chart.   Thank you for this interesting consult.  I greatly enjoyed meeting [name] and look forward to participating in their care.  A copy of this report was sent  to the requesting provider on this date.  Electronically Signed: Tera Mater, PA-C 01/24/2022, 1:58 PM   I spent a total of 20 Minutes    in face to face in clinical consultation, greater than 50% of which was counseling/coordinating care for [facility] placement.   This chart was dictated using voice recognition software.  Despite best efforts to proofread,  errors can occur which can change the documentation meaning.

## 2022-01-24 NOTE — Progress Notes (Signed)
PROGRESS NOTE        PATIENT DETAILS Name: [name] Age: 66 y.o. Sex: male Date of Birth: 1956/03/24 Admit Date: 01/15/2022 Admitting Physician Lequita Halt, MD GYJ:EHUDJ, Malva Limes., FNP  Brief Summary: Patient is a 66 y.o.  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  See below for further details.  Significant events: 8/14>> admit for confusion/gouty arthritis/progression to ESRD. 8/16>> worsening transaminases-renal planning HD. 8/17>> TDC placed-first HD planned. 8/18>> Afib RVR with first HD session-HD cut short. Spontaneously converted to sinus rhythm.Oozing from nontunneled HD cath site.  8/19>> Brief run of RVR.  Tolerated HD without any incident. 8/20>> Worsening encephalopathy-urgent HD, 2 episodes of painless small-volume hematochezia. Hemoglobin dropped to 6.7-given 2 units of PRBC with HD.  8/21>> less encephalopathic-answering simple questions-another episode (third) of hematochezia earlier this morning.  Significant studies: 8/15>> MRI brain: No acute CVA. 8/15>> renal ultrasound: No hydronephrosis. 8/14>> left knee synovial fluid: WBC 23,835, extracellular sodium urate crystals. 8/15>> right knee synovial fluid: WBC 15,600, intracellular monosodium urate crystals. 8/16>> Echo: EF 55-60% 8/16>> RUQ ultrasound: No major abnormalities noted.  Significant microbiology data: 8/14>> blood culture: No growth 8/14>> left knee synovial fluid: No growth 8/15>> right knee synovial fluid: No growth  Procedures: 8/14>> left knee arthrocentesis 8/15>> right knee arthrocentesis 8/17>> tunneled HD catheter placement by IR.  Consults: Nephrology. Orthopedics Cardiology Vascular ID GI  Subjective: No BM x2 days-more awake/alert.  Continues to  have arthralgias/myalgias involving bilateral lower extremity and left hand.   Objective: Vitals: Blood pressure 124/70, pulse 77, temperature 98.7 F (37.1 C), temperature source Oral, resp. rate 16, height '5\' 11"'$  (1.803 m), weight 126.5 kg, SpO2 93 %.   Exam: Gen Exam:Alert awake-not in any distress HEENT:atraumatic, normocephalic Chest: B/L clear to auscultation anteriorly CVS:S1S2 regular Abdomen:soft non tender, non distended Extremities: Continues to have bilateral knee arthralgias, left hand still somewhat tender.  Left arm still swollen.  Unchanged over the past few days. Neurology: Non focal-except for mild left upper extremity weakness-that has been ongoing for the past several months. Skin: no rash    Pertinent Labs/Radiology:    Latest Ref Rng & Units 01/24/2022    4:15 AM 01/23/2022    6:34 PM 01/23/2022    4:50 AM  CBC  WBC 4.0 - 10.5 K/uL 17.5  18.5  19.2   Hemoglobin 13.0 - 17.0 g/dL 8.5  8.6  8.7   Hematocrit 39.0 - 52.0 % 26.5  26.1  26.0   Platelets 150 - 400 K/uL 243  217  196     Lab Results  Component Value Date   NA 135 01/24/2022   K 3.7 01/24/2022   CL 95 (L) 01/24/2022   CO2 30 01/24/2022     Assessment/Plan: SIRS due to acute gouty arthritis involving bilateral knees, numerous joints of left hand: Overall improved but continues to have low-grade arthralgias involving knees/left hand-renal significant response to IV steroids-steroids have been tapered down.  Will need to discuss with orthopedics if there is a role for intra-articular steroids injections especially in his knees.  Continue slowly taper down steroids to his usual regimen of prednisone.  Leukocytosis now downtrending-we will  continue to follow-thankfully patient is afebrile.  If leukocytosis worsens or persist-May need to repeat cultures at some point.  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   Hyperkalemia: Due to  worsening renal function-resolved with Lokelma and HD.  Continue to monitor periodically.  Oozing from tunneled HD catheter site: Evaluated by IR on 8/18-bleeding site sutured-no further bleeding evident.  A-fib with RVR: Triggered by uremia/acute illness-now maintaining sinus rhythm on oral beta-blocker-Eliquis on hold due to ongoing lower GI bleeding.   Lower GI bleeding: No further hematochezia x2 days-hemoglobin stable-remains off Eliquis-GI consulted but since encephalopathy improving-patient still deconditioning-recommendations are to hold off on colonoscopy until he is a little bit more improved.  Per GI-hold Eliquis x5 days from 8/22 before resuming anticoagulation.    Normocytic anemia: Multifactorial etiology-due to acute illness/CKD/lower GI bleeding.  Transfused 2 units of PRBC on 8/20-hemoglobin remains relatively stable.  Continue to follow CBC.  Acute metabolic encephalopathy: Due to AKI/uremia-improving.  Much more awake and alert today.    Transaminitis: Unclear etiology-suspect probably medication related-possibly related to cefepime.  Statin/Zytiga was held-LFTs have improved.  RUQ ultrasound was unremarkable.    Hyponatremia: Related to volume-improved with HD.  Hypoglycemia: Likely related to use of oral hypoglycemic agents in the setting of worsening renal function.  Continue to watch closely.  DM-2 (A1c 9.6 on 8/14) with uncontrolled hyperglycemia due to steroids: CBGs better-borderline hypoglycemic this morning-decrease Levemir to 26 units twice daily, decrease Premeal NovoLog to 8 units.  Follow and optimize.      Will need to permanently stop glipizide and metformin on discharge.  Recent Labs    01/23/22 2117 01/24/22 0819 01/24/22 1217  GLUCAP 206* 75 126*     Chronic left arm weakness: Per patient's son-this has been ongoing for the past several months-son has noted a problem with the patient's grip mostly.  MRI brain negative for acute CVA.  We will need to  check a MRI C-spine at some point.  Suspect some of it may be due to acute gouty arthritis involving his small hand joints as well.  Superficialization of left upper extremity AV fistula by vascular surgery on 8/8: With some residual arm swelling that seems to have worsened over the past few days-appreciate vascular surgery evaluation.  Prostate cancer: On prednisone/Zytiga-currently on higher doses of prednisone than usual-Zytiga held due to elevated LFTs but since they have improved-Zytiga has been resumed.  Morbid Obesity: Estimated body mass index is 38.9 kg/m as calculated from the following:   Height as of this encounter: '5\' 11"'$  (1.803 m).   Weight as of this encounter: 126.5 kg.   Code status:   Code Status: Full Code   DVT Prophylaxis:Hold Eliquis due to ongoing hematochezia.   Family Communication: Son at bedside on 8/23   Disposition Plan: Status is: Inpatient Remains inpatient appropriate because: Likely progression to ESRD-nephrology initiating dialysis-remains encephalopathic-not yet stable for discharge.   Planned Discharge Destination:SNF  Diet: Diet Order             Diet renal/carb modified with fluid restriction Diet-HS Snack? Nothing; Fluid restriction: 1200 mL Fluid; Room service appropriate? Yes; Fluid consistency: Thin  Diet effective now                     Antimicrobial agents: Anti-infectives (From admission, onward)    Start     Dose/Rate Route Frequency Ordered Stop   01/16/22 1000  ceFEPIme (MAXIPIME) 2 g  in sodium chloride 0.9 % 100 mL IVPB  Status:  Discontinued        2 g 200 mL/hr over 30 Minutes Intravenous Every 24 hours 01/15/22 1014 01/17/22 1355   01/15/22 1015  ceFEPIme (MAXIPIME) 2 g in sodium chloride 0.9 % 100 mL IVPB        2 g 200 mL/hr over 30 Minutes Intravenous  Once 01/15/22 1005 01/15/22 1127   01/15/22 1015  metroNIDAZOLE (FLAGYL) IVPB 500 mg        500 mg 100 mL/hr over 60 Minutes Intravenous  Once 01/15/22 1005  01/15/22 1213   01/15/22 1015  vancomycin (VANCOCIN) IVPB 1000 mg/200 mL premix  Status:  Discontinued        1,000 mg 200 mL/hr over 60 Minutes Intravenous  Once 01/15/22 1005 01/15/22 1013   01/15/22 1015  vancomycin (VANCOREADY) IVPB 2000 mg/400 mL        2,000 mg 200 mL/hr over 120 Minutes Intravenous  Once 01/15/22 1013 01/15/22 1325   01/15/22 1011  vancomycin variable dose per unstable renal function (pharmacist dosing)  Status:  Discontinued         Does not apply See admin instructions 01/15/22 1011 01/18/22 1526        MEDICATIONS: Scheduled Meds:  sodium chloride   Intravenous Once   sodium chloride   Intravenous Once   abiraterone acetate  1,000 mg Oral Daily   acetaminophen  650 mg Oral Once   calcitRIOL  0.25 mcg Oral Daily   carvedilol  25 mg Oral BID WC   Chlorhexidine Gluconate Cloth  6 each Topical Q0600   diphenhydrAMINE  25 mg Intravenous Once   feeding supplement (NEPRO CARB STEADY)  237 mL Oral BID BM   insulin aspart  0-9 Units Subcutaneous TID WC   insulin aspart  10 Units Subcutaneous TID WC   insulin detemir  30 Units Subcutaneous BID   multivitamin  1 tablet Oral QHS   pantoprazole  40 mg Oral BID   predniSONE  10 mg Oral Q breakfast   sodium chloride flush  10-40 mL Intracatheter Q12H   sodium chloride flush  3 mL Intravenous Q12H   sodium chloride flush  3 mL Intravenous Q12H   Continuous Infusions:  sodium chloride     sodium chloride     anticoagulant sodium citrate     PRN Meds:.sodium chloride, sodium chloride, acetaminophen, hydrALAZINE, HYDROmorphone (DILAUDID) injection, lidocaine (PF), lidocaine-prilocaine, metoprolol tartrate, ondansetron (ZOFRAN) IV, pentafluoroprop-tetrafluoroeth, sodium chloride, sodium chloride flush, sodium chloride flush, sodium chloride flush   I have personally reviewed following labs and imaging studies  LABORATORY DATA: CBC: Recent Labs  Lab 01/22/22 0405 01/22/22 1643 01/23/22 0450 01/23/22 1834  01/24/22 0415  WBC 22.1* 19.7* 19.2* 18.5* 17.5*  HGB 8.3* 8.3* 8.7* 8.6* 8.5*  HCT 25.4* 26.0* 26.0* 26.1* 26.5*  MCV 86.1 87.2 86.1 86.4 86.9  PLT 156 168 196 217 243     Basic Metabolic Panel: Recent Labs  Lab 01/18/22 0339 01/19/22 0321 01/20/22 0820 01/21/22 0258 01/22/22 0405 01/23/22 0450 01/24/22 0841  NA 129*   < > 133* 130* 135 133* 135  K 4.8   < > 4.2 3.9 4.0 4.2 3.7  CL 93*   < > 95* 92* 95* 92* 95*  CO2 17*   < > 19* '22 22 22 30  '$ GLUCOSE 360*   < > 159* 233* 181* 304* 72  BUN 170*   < > 177* 140* 110* 140* 95*  CREATININE 9.54*   < > 9.55* 8.00* 6.83* 7.77* 6.35*  CALCIUM 7.1*   < > 7.9* 7.8* 8.1* 8.5* 8.2*  PHOS 10.1*  --  10.2*  --  7.7*  --  6.1*   < > = values in this interval not displayed.     GFR: Estimated Creatinine Clearance: 15.1 mL/min (A) (by C-G formula based on SCr of 6.35 mg/dL (H)).  Liver Function Tests: Recent Labs  Lab 01/18/22 1042 01/19/22 0321 01/20/22 0343 01/20/22 0820 01/21/22 0258 01/22/22 0405 01/23/22 0450 01/24/22 0841  AST 456* 345* 149*  --  75*  --  35  --   ALT 132* 129* 95*  --  72*  --  48*  --   ALKPHOS 125 133* 94  --  83  --  81  --   BILITOT 0.7 0.9 0.8  --  1.0  --  0.6  --   PROT 6.0* 6.1* 5.8*  --  5.5*  --  5.9*  --   ALBUMIN 1.6* 1.6* 1.6* 1.6* 1.6* 1.6* 1.7* 1.6*    No results for input(s): "LIPASE", "AMYLASE" in the last 168 hours. No results for input(s): "AMMONIA" in the last 168 hours.   Coagulation Profile: No results for input(s): "INR", "PROTIME" in the last 168 hours.   Cardiac Enzymes: No results for input(s): "CKTOTAL", "CKMB", "CKMBINDEX", "TROPONINI" in the last 168 hours.  BNP (last 3 results) No results for input(s): "PROBNP" in the last 8760 hours.  Lipid Profile: No results for input(s): "CHOL", "HDL", "LDLCALC", "TRIG", "CHOLHDL", "LDLDIRECT" in the last 72 hours.  Thyroid Function Tests: No results for input(s): "TSH", "T4TOTAL", "FREET4", "T3FREE", "THYROIDAB" in the  last 72 hours.   Anemia Panel: No results for input(s): "VITAMINB12", "FOLATE", "FERRITIN", "TIBC", "IRON", "RETICCTPCT" in the last 72 hours.   Urine analysis:    Component Value Date/Time   COLORURINE YELLOW 05/04/2008 2129   APPEARANCEUR Cloudy (A) 08/19/2015 1403   LABSPEC 1.012 05/04/2008 2129   PHURINE 5.5 05/04/2008 2129   GLUCOSEU Negative 08/19/2015 1403   GLUCOSEU 500 (A) 05/04/2008 2129   HGBUR SMALL (A) 04/22/2008 0848   BILIRUBINUR Negative 08/19/2015 1403   KETONESUR NEG mg/dL 05/04/2008 2129   PROTEINUR 1+ (A) 08/19/2015 1403   PROTEINUR 30 (A) 05/04/2008 2129   UROBILINOGEN 0.2 05/04/2008 2129   NITRITE Negative 08/19/2015 1403   NITRITE NEG 05/04/2008 2129   LEUKOCYTESUR 1+ (A) 08/19/2015 1403    Sepsis Labs: Lactic Acid, Venous    Component Value Date/Time   LATICACIDVEN 2.6 (HH) 01/15/2022 1026    MICROBIOLOGY: Recent Results (from the past 240 hour(s))  Blood Culture (routine x 2)     Status: None   Collection Time: 01/15/22 10:04 AM   Specimen: BLOOD RIGHT FOREARM  Result Value Ref Range Status   Specimen Description BLOOD RIGHT FOREARM  Final   Special Requests   Final    BOTTLES DRAWN AEROBIC AND ANAEROBIC Blood Culture adequate volume   Culture   Final    NO GROWTH 5 DAYS Performed at [facility], [place] [address] [place], [place] [address]    Report Status 01/20/2022 FINAL  Final  Resp Panel by RT-PCR (Flu A&B, Covid) Anterior Nasal Swab     Status: None   Collection Time: 01/15/22 10:05 AM   Specimen: Anterior Nasal Swab  Result Value Ref Range Status   SARS Coronavirus 2 by RT PCR NEGATIVE NEGATIVE Final    Comment: (NOTE) SARS-CoV-2 target nucleic acids  are NOT DETECTED.  The SARS-CoV-2 RNA is generally detectable in upper respiratory specimens during the acute phase of infection. The lowest concentration of SARS-CoV-2 viral copies this assay can detect is 138 copies/mL. A negative result does not preclude  SARS-Cov-2 infection and should not be used as the sole basis for treatment or other patient management decisions. A negative result may occur with  improper specimen collection/handling, submission of specimen other than nasopharyngeal swab, presence of viral mutation(s) within the areas targeted by this assay, and inadequate number of viral copies(<138 copies/mL). A negative result must be combined with clinical observations, patient history, and epidemiological information. The expected result is Negative.  Fact Sheet for Patients:  EntrepreneurPulse.com.au  Fact Sheet for Healthcare Providers:  IncredibleEmployment.be  This test is no t yet approved or cleared by the [place] FDA and  has been authorized for detection and/or diagnosis of SARS-CoV-2 by FDA under an Emergency Use Authorization (EUA). This EUA will remain  in effect (meaning this test can be used) for the duration of the COVID-19 declaration under Section 564(b)(1) of the Act, 21 U.S.C.section 360bbb-3(b)(1), unless the authorization is terminated  or revoked sooner.       Influenza A by PCR NEGATIVE NEGATIVE Final   Influenza B by PCR NEGATIVE NEGATIVE Final    Comment: (NOTE) The Xpert Xpress SARS-CoV-2/FLU/RSV plus assay is intended as an aid in the diagnosis of influenza from Nasopharyngeal swab specimens and should not be used as a sole basis for treatment. Nasal washings and aspirates are unacceptable for Xpert Xpress SARS-CoV-2/FLU/RSV testing.  Fact Sheet for Patients: EntrepreneurPulse.com.au  Fact Sheet for Healthcare Providers: IncredibleEmployment.be  This test is not yet approved or cleared by the [place] FDA and has been authorized for detection and/or diagnosis of SARS-CoV-2 by FDA under an Emergency Use Authorization (EUA). This EUA will remain in effect (meaning this test can be used) for the duration of  the COVID-19 declaration under Section 564(b)(1) of the Act, 21 U.S.C. section 360bbb-3(b)(1), unless the authorization is terminated or revoked.  Performed at [facility], [place] [address], [place], [place] [address]   Blood Culture (routine x 2)     Status: None   Collection Time: 01/15/22 10:09 AM   Specimen: BLOOD  Result Value Ref Range Status   Specimen Description BLOOD SITE NOT SPECIFIED  Final   Special Requests   Final    BOTTLES DRAWN AEROBIC AND ANAEROBIC Blood Culture adequate volume   Culture   Final    NO GROWTH 5 DAYS Performed at [facility], [address], [place], [place] [address]    Report Status 01/20/2022 FINAL  Final  MRSA Next Gen by PCR, Nasal     Status: None   Collection Time: 01/15/22  1:28 PM   Specimen: Nasal Mucosa; Nasal Swab  Result Value Ref Range Status   MRSA by PCR Next Gen NOT DETECTED NOT DETECTED Final    Comment: (NOTE) The GeneXpert MRSA Assay (FDA approved for NASAL specimens only), is one component of a comprehensive MRSA colonization surveillance program. It is not intended to diagnose MRSA infection nor to guide or monitor treatment for MRSA infections. Test performance is not FDA approved in patients less than 86 years old. Performed at [facility], [place] [address], [place], [place] [address]   Body fluid culture w Gram Stain     Status: None   Collection Time: 01/15/22  5:59 PM   Specimen: Synovial Fluid  Result Value Ref Range Status   Specimen Description FLUID SYNOVIAL KNEE LEFT  Final   Special Requests NONE  Final   Gram Stain   Final    ABUNDANT WBC PRESENT, PREDOMINANTLY PMN NO ORGANISMS SEEN    Culture   Final    NO GROWTH 3 DAYS Performed at [facility], [address] [place], [place] [address]    Report Status 01/18/2022 FINAL  Final  Anaerobic culture w Gram Stain     Status: None   Collection Time: 01/15/22  5:59 PM   Specimen: Synovial Fluid  Result Value Ref Range Status    Specimen Description FLUID SYNOVIAL KNEE LEFT  Final   Special Requests NONE  Final   Gram Stain   Final    ABUNDANT WBC PRESENT, PREDOMINANTLY PMN NO ORGANISMS SEEN    Culture   Final    NO ANAEROBES ISOLATED Performed at [facility], [place] [address] [place], [place] [address]    Report Status 01/20/2022 FINAL  Final  Body fluid culture w Gram Stain     Status: None   Collection Time: 01/16/22 11:48 AM   Specimen: Synovium; Body Fluid  Result Value Ref Range Status   Specimen Description SYNOVIAL  Final   Special Requests  RIGHT KNEE  Final   Gram Stain   Final    ABUNDANT WBC PRESENT, PREDOMINANTLY PMN NO ORGANISMS SEEN    Culture   Final    NO GROWTH 3 DAYS Performed at [facility], [address] [place] [employer] [place], [place] [address]    Report Status 01/19/2022 FINAL  Final    RADIOLOGY STUDIES/RESULTS: No results found.   LOS: 9 days   Oren Binet, MD  [facility]    To contact the attending provider between 7A-7P or the covering provider during after hours 7P-7A, please log into the web site [email or web address] and access using universal [place] password for that web site. If you do not have the password, please call the hospital operator.  01/24/2022, 1:27 PM

## 2022-01-25 ENCOUNTER — Other Ambulatory Visit: Payer: Self-pay

## 2022-01-25 DIAGNOSIS — G9340 Encephalopathy, unspecified: Secondary | ICD-10-CM | POA: Diagnosis not present

## 2022-01-25 DIAGNOSIS — G9341 Metabolic encephalopathy: Secondary | ICD-10-CM | POA: Diagnosis not present

## 2022-01-25 DIAGNOSIS — R4182 Altered mental status, unspecified: Secondary | ICD-10-CM | POA: Diagnosis not present

## 2022-01-25 DIAGNOSIS — A419 Sepsis, unspecified organism: Secondary | ICD-10-CM | POA: Diagnosis not present

## 2022-01-25 LAB — RENAL FUNCTION PANEL
Albumin: 1.6 g/dL — ABNORMAL LOW (ref 3.5–5.0)
Anion gap: 16 — ABNORMAL HIGH (ref 5–15)
BUN: 116 mg/dL — ABNORMAL HIGH (ref 8–23)
CO2: 23 mmol/L (ref 22–32)
Calcium: 8.3 mg/dL — ABNORMAL LOW (ref 8.9–10.3)
Chloride: 94 mmol/L — ABNORMAL LOW (ref 98–111)
Creatinine, Ser: 7.6 mg/dL — ABNORMAL HIGH (ref 0.61–1.24)
GFR, Estimated: 7 mL/min — ABNORMAL LOW (ref >60)
Glucose, Bld: 49 mg/dL — ABNORMAL LOW (ref 70–99)
Phosphorus: 8 mg/dL — ABNORMAL HIGH (ref 2.5–4.6)
Potassium: 3.8 mmol/L (ref 3.5–5.1)
Sodium: 133 mmol/L — ABNORMAL LOW (ref 135–145)

## 2022-01-25 LAB — CBC
HCT: 25.2 % — ABNORMAL LOW (ref 39.0–52.0)
Hemoglobin: 8.3 g/dL — ABNORMAL LOW (ref 13.0–17.0)
MCH: 28.4 pg (ref 26.0–34.0)
MCHC: 32.9 g/dL (ref 30.0–36.0)
MCV: 86.3 fL (ref 80.0–100.0)
Platelets: 254 10*3/uL (ref 150–400)
RBC: 2.92 MIL/uL — ABNORMAL LOW (ref 4.22–5.81)
RDW: 15.4 % (ref 11.5–15.5)
WBC: 17 10*3/uL — ABNORMAL HIGH (ref 4.0–10.5)
nRBC: 0 % (ref 0.0–0.2)

## 2022-01-25 LAB — GLUCOSE, CAPILLARY
Glucose-Capillary: 109 mg/dL — ABNORMAL HIGH (ref 70–99)
Glucose-Capillary: 56 mg/dL — ABNORMAL LOW (ref 70–99)
Glucose-Capillary: 168 mg/dL — ABNORMAL HIGH (ref 70–99)
Glucose-Capillary: 248 mg/dL — ABNORMAL HIGH (ref 70–99)

## 2022-01-25 LAB — MAGNESIUM: Magnesium: 1.9 mg/dL (ref 1.7–2.4)

## 2022-01-25 MED ORDER — HEPARIN SODIUM (PORCINE) 1000 UNIT/ML IJ SOLN
INTRAMUSCULAR | Status: AC
  Filled 2022-01-25: qty 4

## 2022-01-25 MED ORDER — BUPIVACAINE HCL (PF) 0.5 % IJ SOLN
10.0000 mL | Freq: Once | INTRAMUSCULAR | Status: AC
  Administered 2022-01-25: 10 mL
  Filled 2022-01-25: qty 10

## 2022-01-25 MED ORDER — INSULIN ASPART 100 UNIT/ML IJ SOLN
6.0000 [IU] | Freq: Three times a day (TID) | INTRAMUSCULAR | Status: DC
  Administered 2022-01-26 (×2): 6 [IU] via SUBCUTANEOUS

## 2022-01-25 MED ORDER — METHYLPREDNISOLONE ACETATE 40 MG/ML IJ SUSP
80.0000 mg | Freq: Once | INTRAMUSCULAR | Status: AC
  Administered 2022-01-25: 80 mg via INTRA_ARTICULAR
  Filled 2022-01-25: qty 2

## 2022-01-25 MED ORDER — INSULIN DETEMIR 100 UNIT/ML ~~LOC~~ SOLN
22.0000 [IU] | Freq: Two times a day (BID) | SUBCUTANEOUS | Status: DC
Start: 2022-01-25 — End: 2022-01-27
  Administered 2022-01-25 – 2022-01-26 (×3): 22 [IU] via SUBCUTANEOUS
  Filled 2022-01-25 (×5): qty 0.22

## 2022-01-25 NOTE — Progress Notes (Signed)
Pt has been accepted at [facility] on MWF 12:00 chair time. Pt will need to arrive at 11:15 for first appt to complete paperwork prior to first treatment. Spoke to pt's son via phone to discuss the above arrangements. Update provided to attending, nephrologist, and CSW. Will assist as needed.  Melven Sartorius Renal Navigator [phone]

## 2022-01-25 NOTE — Progress Notes (Signed)
PROGRESS NOTE        PATIENT DETAILS Name: SORA OLIVO Age: 66 y.o. Sex: male Date of Birth: 01-Aug-1955 Admit Date: 01/15/2022 Admitting Physician Lequita Halt, MD IRW:[id number], Malva Limes., FNP  Brief Summary: Patient is a 66 y.o.  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  See below for further details.  Significant events: 8/14>> admit for confusion/gouty arthritis/progression to ESRD. 8/16>> worsening transaminases-renal planning HD. 8/17>> TDC placed-first HD planned. 8/18>> Afib RVR with first HD session-HD cut short. Spontaneously converted to sinus rhythm.Oozing from nontunneled HD cath site.  8/19>> Brief run of RVR.  Tolerated HD without any incident. 8/20>> Worsening encephalopathy-urgent HD, 2 episodes of painless small-volume hematochezia. Hemoglobin dropped to 6.7-given 2 units of PRBC with HD.  8/21>> less encephalopathic-answering simple questions-another episode (third) of hematochezia earlier this morning.  Significant studies: 8/15>> MRI brain: No acute CVA. 8/15>> renal ultrasound: No hydronephrosis. 8/14>> left knee synovial fluid: WBC 23,835, extracellular sodium urate crystals. 8/15>> right knee synovial fluid: WBC 15,600, intracellular monosodium urate crystals. 8/16>> Echo: EF 55-60% 8/16>> RUQ ultrasound: No major abnormalities noted.  Significant microbiology data: 8/14>> blood culture: No growth 8/14>> left knee synovial fluid: No growth 8/15>> right knee synovial fluid: No growth  Procedures: 8/14>> left knee arthrocentesis 8/15>> right knee arthrocentesis 8/17>> tunneled HD catheter placement by IR.  Consults: Nephrology. Orthopedics Cardiology Vascular ID GI  Subjective: No major events overnight-continues to have  arthralgias involving knees.  Left upper extremity remains swollen-hand area is somewhat tender.  Mentation continues to slowly improve-he is awake/alert but slow.   Objective: Vitals: Blood pressure (!) 154/50, pulse 77, temperature 98.8 F (37.1 C), temperature source Oral, resp. rate 14, height '5\' 11"'$  (1.803 m), weight 125.9 kg, SpO2 94 %.   Exam: Gen Exam:Alert awake-not in any distress HEENT:atraumatic, normocephalic Chest: B/L clear to auscultation anteriorly CVS:S1S2 regular Abdomen:soft non tender, non distended Extremities: LUE remains swollen-unchanged over the past several days-bilateral knees remain tender when palpated.   Neurology: Minimal left upper extremity weakness (ongoing prior to this hospitalization)-likely due to swelling and other issues. Skin: no rash   Pertinent Labs/Radiology:    Latest Ref Rng & Units 01/25/2022    6:02 AM 01/24/2022    4:15 AM 01/23/2022    6:34 PM  CBC  WBC 4.0 - 10.5 K/uL 17.0  17.5  18.5   Hemoglobin 13.0 - 17.0 g/dL 8.3  8.5  8.6   Hematocrit 39.0 - 52.0 % 25.2  26.5  26.1   Platelets 150 - 400 K/uL 254  243  217     Lab Results  Component Value Date   NA 133 (L) 01/25/2022   K 3.8 01/25/2022   CL 94 (L) 01/25/2022   CO2 23 01/25/2022     Assessment/Plan: SIRS due to acute gouty arthritis involving bilateral knees, numerous joints of left hand: Although overall improved-continues to have arthralgias-he is not able to ambulate due to pain-previously on IV steroids but subsequently has been tapered down.  Discussed with orthopedics-Dr. Elijah Birk will ask his team to evaluate for intra-articular steroid injections.  Thankfully leukocytosis continues to downtrend.  All blood/synovial fluid cultures remain negative so  far.  No evidence of septic arthritis (ruled out) no evidence of sepsis.  Remains off all antimicrobial therapy.  CKD stage V with progression to ESRD: Nephrology following and directing care.  Hyperkalemia: Due to  worsening renal function-resolved with Lokelma and HD.  Continue to monitor periodically.  Oozing from tunneled HD catheter site: Evaluated by IR on 8/18-bleeding site sutured-no further bleeding evident.  A-fib with RVR: Triggered by uremia/acute illness-now maintaining sinus rhythm on oral beta-blocker-Eliquis on hold due to ongoing lower GI bleeding.   Lower GI bleeding: No further hematochezia x3 days-hemoglobin stable-remains off Eliquis-GI consulted but since encephalopathy improving-patient still deconditioning-recommendations are to hold off on colonoscopy until he is a little bit more improved.  Per GI-hold Eliquis x5 days from 8/22 before resuming anticoagulation.    Normocytic anemia: Multifactorial etiology-due to acute illness/CKD/acute blood loss anemia due to lower GI bleeding.  Transfused 2 units of PRBC on 8/20-hemoglobin remains relatively stable.  Continue to follow CBC.  Acute metabolic encephalopathy: Due to AKI/uremia-improving.  Much more awake and alert today.    Transaminitis: Unclear etiology-suspect probably medication related-possibly related to cefepime.  Statin/Zytiga was held-LFTs have improved.  RUQ ultrasound was unremarkable.    Hyponatremia: Related to volume-improved with HD.  Hypoglycemia: Likely related to use of oral hypoglycemic agents in the setting of worsening renal function.  Continue to watch closely.  DM-2 (A1c 9.6 on 8/14) with uncontrolled hyperglycemia due to steroids: CBGs are better in the 70s range this morning-decrease Levemir to 22 units twice daily, decrease Premeal NovoLog to 6 units.  Follow/optimize.   Follow and optimize.      Will need to permanently stop glipizide and metformin on discharge.  Recent Labs    01/24/22 1217 01/24/22 1714 01/24/22 2046  GLUCAP 126* 74 79     Chronic left arm weakness: Per patient's son-this has been ongoing for the past several months-son has noted a problem with the patient's grip mostly.  MRI  brain negative for acute CVA.  We will need to check a MRI C-spine at some point.  Suspect some of it may be due to acute gouty arthritis involving his small hand joints as well.  Superficialization of left upper extremity AV fistula by vascular surgery on 8/8: With some residual arm swelling that seems to have worsened over the past few days-appreciate vascular surgery evaluation.  Prostate cancer: On prednisone/Zytiga-currently on higher doses of prednisone than usual-Zytiga held due to elevated LFTs but since they have improved-Zytiga has been resumed.  Morbid Obesity: Estimated body mass index is 38.71 kg/m as calculated from the following:   Height as of this encounter: '5\' 11"'$  (1.803 m).   Weight as of this encounter: 125.9 kg.   Code status:   Code Status: Full Code   DVT Prophylaxis:Hold Eliquis due to ongoing hematochezia.   Family Communication: Son at bedside on 8/24   Disposition Plan: Status is: Inpatient Remains inpatient appropriate because: Likely progression to ESRD-nephrology initiating dialysis-remains encephalopathic-not yet stable for discharge.   Planned Discharge Destination:SNF  Diet: Diet Order             Diet renal/carb modified with fluid restriction Diet-HS Snack? Nothing; Fluid restriction: 1200 mL Fluid; Room service appropriate? Yes; Fluid consistency: Thin  Diet effective now                     Antimicrobial agents: Anti-infectives (From admission, onward)    Start     Dose/Rate Route Frequency Ordered Stop  01/24/22 1535  ceFAZolin (ANCEF) IVPB 2g/100 mL premix        over 30 Minutes Intravenous Continuous PRN 01/24/22 1535 01/24/22 1535   01/24/22 1445  ceFAZolin (ANCEF) IVPB 2g/100 mL premix        2 g 200 mL/hr over 30 Minutes Intravenous To Radiology 01/24/22 1359 01/25/22 1445   01/16/22 1000  ceFEPIme (MAXIPIME) 2 g in sodium chloride 0.9 % 100 mL IVPB  Status:  Discontinued        2 g 200 mL/hr over 30 Minutes Intravenous  Every 24 hours 01/15/22 1014 01/17/22 1355   01/15/22 1015  ceFEPIme (MAXIPIME) 2 g in sodium chloride 0.9 % 100 mL IVPB        2 g 200 mL/hr over 30 Minutes Intravenous  Once 01/15/22 1005 01/15/22 1127   01/15/22 1015  metroNIDAZOLE (FLAGYL) IVPB 500 mg        500 mg 100 mL/hr over 60 Minutes Intravenous  Once 01/15/22 1005 01/15/22 1213   01/15/22 1015  vancomycin (VANCOCIN) IVPB 1000 mg/200 mL premix  Status:  Discontinued        1,000 mg 200 mL/hr over 60 Minutes Intravenous  Once 01/15/22 1005 01/15/22 1013   01/15/22 1015  vancomycin (VANCOREADY) IVPB 2000 mg/400 mL        2,000 mg 200 mL/hr over 120 Minutes Intravenous  Once 01/15/22 1013 01/15/22 1325   01/15/22 1011  vancomycin variable dose per unstable renal function (pharmacist dosing)  Status:  Discontinued         Does not apply See admin instructions 01/15/22 1011 01/18/22 1526        MEDICATIONS: Scheduled Meds:  abiraterone acetate  1,000 mg Oral Daily   bupivacaine(PF)  10 mL Infiltration Once   calcitRIOL  0.25 mcg Oral Daily   carvedilol  25 mg Oral BID WC   Chlorhexidine Gluconate Cloth  6 each Topical Q0600   feeding supplement (NEPRO CARB STEADY)  237 mL Oral BID BM   heparin sodium (porcine)       insulin aspart  0-9 Units Subcutaneous TID WC   insulin aspart  8 Units Subcutaneous TID WC   insulin detemir  26 Units Subcutaneous BID   methylPREDNISolone acetate  80 mg Intra-articular Once   multivitamin  1 tablet Oral QHS   pantoprazole  40 mg Oral BID   predniSONE  10 mg Oral Q breakfast   sodium chloride flush  10-40 mL Intracatheter Q12H   sodium chloride flush  3 mL Intravenous Q12H   sodium chloride flush  3 mL Intravenous Q12H   Continuous Infusions:  sodium chloride     sodium chloride      ceFAZolin (ANCEF) IV     PRN Meds:.sodium chloride, sodium chloride, acetaminophen, heparin sodium (porcine), hydrALAZINE, HYDROmorphone (DILAUDID) injection, lidocaine (PF), lidocaine-prilocaine,  metoprolol tartrate, ondansetron (ZOFRAN) IV, pentafluoroprop-tetrafluoroeth, sodium chloride, sodium chloride flush, sodium chloride flush, sodium chloride flush   I have personally reviewed following labs and imaging studies  LABORATORY DATA: CBC: Recent Labs  Lab 01/22/22 1643 01/23/22 0450 01/23/22 1834 01/24/22 0415 01/25/22 0602  WBC 19.7* 19.2* 18.5* 17.5* 17.0*  HGB 8.3* 8.7* 8.6* 8.5* 8.3*  HCT 26.0* 26.0* 26.1* 26.5* 25.2*  MCV 87.2 86.1 86.4 86.9 86.3  PLT 168 196 217 243 254     Basic Metabolic Panel: Recent Labs  Lab 01/20/22 0820 01/21/22 0258 01/22/22 0405 01/23/22 0450 01/24/22 0841 01/25/22 0602  NA 133* 130* 135 133* 135 133*  K  4.2 3.9 4.0 4.2 3.7 3.8  CL 95* 92* 95* 92* 95* 94*  CO2 19* '22 22 22 30 23  '$ GLUCOSE 159* 233* 181* 304* 72 49*  BUN 177* 140* 110* 140* 95* 116*  CREATININE 9.55* 8.00* 6.83* 7.77* 6.35* 7.60*  CALCIUM 7.9* 7.8* 8.1* 8.5* 8.2* 8.3*  MG  --   --   --   --   --  1.9  PHOS 10.2*  --  7.7*  --  6.1* 8.0*     GFR: Estimated Creatinine Clearance: 12.6 mL/min (A) (by C-G formula based on SCr of 7.6 mg/dL (H)).  Liver Function Tests: Recent Labs  Lab 01/19/22 0321 01/20/22 0343 01/20/22 0820 01/21/22 0258 01/22/22 0405 01/23/22 0450 01/24/22 0841 01/25/22 0602  AST 345* 149*  --  75*  --  35  --   --   ALT 129* 95*  --  72*  --  48*  --   --   ALKPHOS 133* 94  --  83  --  81  --   --   BILITOT 0.9 0.8  --  1.0  --  0.6  --   --   PROT 6.1* 5.8*  --  5.5*  --  5.9*  --   --   ALBUMIN 1.6* 1.6*   < > 1.6* 1.6* 1.7* 1.6* 1.6*   < > = values in this interval not displayed.    No results for input(s): "LIPASE", "AMYLASE" in the last 168 hours. No results for input(s): "AMMONIA" in the last 168 hours.   Coagulation Profile: No results for input(s): "INR", "PROTIME" in the last 168 hours.   Cardiac Enzymes: No results for input(s): "CKTOTAL", "CKMB", "CKMBINDEX", "TROPONINI" in the last 168 hours.  BNP (last 3  results) No results for input(s): "PROBNP" in the last 8760 hours.  Lipid Profile: No results for input(s): "CHOL", "HDL", "LDLCALC", "TRIG", "CHOLHDL", "LDLDIRECT" in the last 72 hours.  Thyroid Function Tests: No results for input(s): "TSH", "T4TOTAL", "FREET4", "T3FREE", "THYROIDAB" in the last 72 hours.   Anemia Panel: No results for input(s): "VITAMINB12", "FOLATE", "FERRITIN", "TIBC", "IRON", "RETICCTPCT" in the last 72 hours.   Urine analysis:    Component Value Date/Time   COLORURINE YELLOW 05/04/2008 2129   APPEARANCEUR Cloudy (A) 08/19/2015 1403   LABSPEC 1.012 05/04/2008 2129   PHURINE 5.5 05/04/2008 2129   GLUCOSEU Negative 08/19/2015 1403   GLUCOSEU 500 (A) 05/04/2008 2129   HGBUR SMALL (A) 04/22/2008 0848   BILIRUBINUR Negative 08/19/2015 1403   KETONESUR NEG mg/dL 05/04/2008 2129   PROTEINUR 1+ (A) 08/19/2015 1403   PROTEINUR 30 (A) 05/04/2008 2129   UROBILINOGEN 0.2 05/04/2008 2129   NITRITE Negative 08/19/2015 1403   NITRITE NEG 05/04/2008 2129   LEUKOCYTESUR 1+ (A) 08/19/2015 1403    Sepsis Labs: Lactic Acid, Venous    Component Value Date/Time   LATICACIDVEN 2.6 (HH) 01/15/2022 1026    MICROBIOLOGY: Recent Results (from the past 240 hour(s))  MRSA Next Gen by PCR, Nasal     Status: None   Collection Time: 01/15/22  1:28 PM   Specimen: Nasal Mucosa; Nasal Swab  Result Value Ref Range Status   MRSA by PCR Next Gen NOT DETECTED NOT DETECTED Final    Comment: (NOTE) The GeneXpert MRSA Assay (FDA approved for NASAL specimens only), is one component of a comprehensive MRSA colonization surveillance program. It is not intended to diagnose MRSA infection nor to guide or monitor treatment for MRSA infections. Test performance is  not FDA approved in patients less than 10 years old. Performed at [facility], [place] [address] [place], [place] [address]   Body fluid culture w Gram Stain     Status: None   Collection Time: 01/15/22  5:59 PM    Specimen: Synovial Fluid  Result Value Ref Range Status   Specimen Description FLUID SYNOVIAL KNEE LEFT  Final   Special Requests NONE  Final   Gram Stain   Final    ABUNDANT WBC PRESENT, PREDOMINANTLY PMN NO ORGANISMS SEEN    Culture   Final    NO GROWTH 3 DAYS Performed at [facility], [place] [address], [place], [place] [address]    Report Status 01/18/2022 FINAL  Final  Anaerobic culture w Gram Stain     Status: None   Collection Time: 01/15/22  5:59 PM   Specimen: Synovial Fluid  Result Value Ref Range Status   Specimen Description FLUID SYNOVIAL KNEE LEFT  Final   Special Requests NONE  Final   Gram Stain   Final    ABUNDANT WBC PRESENT, PREDOMINANTLY PMN NO ORGANISMS SEEN    Culture   Final    NO ANAEROBES ISOLATED Performed at [facility], [place] [address], [place], [place] [address]    Report Status 01/20/2022 FINAL  Final  Body fluid culture w Gram Stain     Status: None   Collection Time: 01/16/22 11:48 AM   Specimen: Synovium; Body Fluid  Result Value Ref Range Status   Specimen Description SYNOVIAL  Final   Special Requests  RIGHT KNEE  Final   Gram Stain   Final    ABUNDANT WBC PRESENT, PREDOMINANTLY PMN NO ORGANISMS SEEN    Culture   Final    NO GROWTH 3 DAYS Performed at [facility], [address], [place], [place] [address]    Report Status 01/19/2022 FINAL  Final    RADIOLOGY STUDIES/RESULTS: IR Fluoro Guide CV Line Right  Result Date: 01/24/2022 INDICATION: End-stage renal disease. In need of durable intravenous access for continuation of hemodialysis. Please convert temporary dialysis catheter (placed on 01/18/2022 by Dr. Serafina Royals), to a tunneled hemodialysis catheter. EXAM: FLUOROSCOPIC GUIDED CONVERSION OF NON TUNNELED TO TUNNELED HEMODIALYSIS CATHETER COMPARISON:  Image guided temporary dialysis catheter placement-01/18/2022 MEDICATIONS: None ANESTHESIA/SEDATION: Moderate (conscious) sedation was employed during this procedure as  administered by the Interventional Radiology RN. A total of Versed 1.5 mg and Fentanyl 75 mcg was administered intravenously. Moderate Sedation Time: 20 minutes. The patient's level of consciousness and vital signs were monitored continuously by radiology nursing throughout the procedure under my direct supervision. FLUOROSCOPY TIME:  48 seconds (8 mGy) COMPLICATIONS: None immediate. PROCEDURE: Informed written consent was obtained from the patient's son after a discussion of the risks, benefits, and alternatives to treatment. Questions regarding the procedure were encouraged and answered. The skin and external portion of the existing hemodialysis catheter was prepped with chlorhexidine in a sterile fashion, and a sterile drape was applied covering the operative field. Maximum barrier sterile technique with sterile gowns and gloves were used for the procedure. A timeout was performed prior to the initiation of the procedure. A lumen of the non tunneled temporary dialysis catheter was cannulated with a stiff Glidewire and utilized for measurement purposes. Next, the stiff Glidewire was advanced to the level of the IVC. A 19 cm tip to cuff palindrome dialysis catheter was tunneled from a site along the anterior chest to the venotomy site. Under  intermittent fluoroscopic guidance, the temporary dialysis catheter was exchanged for a peel-away sheath. The tunneled dialysis catheter was inserted into the peel-away sheath with tips ultimately terminating within the superior aspect of the right atrium. Final catheter positioning was confirmed and documented with a spot fluoroscopic image. The catheter aspirates and flushes normally. The catheter was flushed with appropriate volume heparin dwells. The catheter exit site was secured with a 0-Prolene retention sutures. The venotomy site was closed with 2 interrupted 4 0 Vicryl sutures and the skin was apposed with Dermabond and Steri-Strips. Both lumens were heparinized. A  dressing was applied. The patient tolerated the procedure well without immediate post procedural complication. IMPRESSION: Successful fluoroscopic guided conversion of a temporary to a permanent 19 cm tip to cuff tunneled hemodialysis catheter with tips terminating within the superior aspect of the right atrium. The new tunneled dialysis catheter is ready for immediate use. Electronically Signed   By: Sandi Mariscal M.D.   On: 01/24/2022 16:31     LOS: 10 days   Oren Binet, MD  [facility]    To contact the attending provider between 7A-7P or the covering provider during after hours 7P-7A, please log into the web site [email or web address] and access using universal [place] password for that web site. If you do not have the password, please call the hospital operator.  01/25/2022, 12:04 PM

## 2022-01-25 NOTE — Procedures (Signed)
I was present at this dialysis session. I have reviewed the session itself and made appropriate changes.   Converted to [facility] yesterday with IR.  Good BFR 400 today.    K 3.8 on 2K bath.  UF goal 2L.  Hb stable 8.3.   No c/o.    Filed Weights   01/23/22 1236 01/24/22 0500 01/25/22 0500  Weight: 123.5 kg 126.5 kg 125.9 kg    Recent Labs  Lab 01/25/22 0602  NA 133*  K 3.8  CL 94*  CO2 23  GLUCOSE 49*  BUN 116*  CREATININE 7.60*  CALCIUM 8.3*  PHOS 8.0*    Recent Labs  Lab 01/23/22 1834 01/24/22 0415 01/25/22 0602  WBC 18.5* 17.5* 17.0*  HGB 8.6* 8.5* 8.3*  HCT 26.1* 26.5* 25.2*  MCV 86.4 86.9 86.3  PLT 217 243 254    Scheduled Meds:  abiraterone acetate  1,000 mg Oral Daily   calcitRIOL  0.25 mcg Oral Daily   carvedilol  25 mg Oral BID WC   Chlorhexidine Gluconate Cloth  6 each Topical Q0600   feeding supplement (NEPRO CARB STEADY)  237 mL Oral BID BM   insulin aspart  0-9 Units Subcutaneous TID WC   insulin aspart  8 Units Subcutaneous TID WC   insulin detemir  26 Units Subcutaneous BID   multivitamin  1 tablet Oral QHS   pantoprazole  40 mg Oral BID   predniSONE  10 mg Oral Q breakfast   sodium chloride flush  10-40 mL Intracatheter Q12H   sodium chloride flush  3 mL Intravenous Q12H   sodium chloride flush  3 mL Intravenous Q12H   Continuous Infusions:  sodium chloride     sodium chloride      ceFAZolin (ANCEF) IV     PRN Meds:.sodium chloride, sodium chloride, acetaminophen, hydrALAZINE, HYDROmorphone (DILAUDID) injection, lidocaine (PF), lidocaine-prilocaine, metoprolol tartrate, ondansetron (ZOFRAN) IV, pentafluoroprop-tetrafluoroeth, sodium chloride, sodium chloride flush, sodium chloride flush, sodium chloride flush   Pearson Grippe  MD 01/25/2022, 9:39 AM

## 2022-01-25 NOTE — Procedures (Signed)
Procedure: Bilateral knee injection   Indication: Bilateral knee pain   Surgeon: Silvestre Gunner, PA-C   Assist: None   Anesthesia: Topical refrigerant   EBL: None   Complications: None   Findings: After risks/benefits explained patient desires to undergo procedure. The bilateral knees were sterilely prepped and aspirated. 82m 0.5% Marcaine and '40mg'$  depo-medrol instilled into each. Pt tolerated the procedure well.       MLisette Abu PA-C Orthopedic Surgery [phone]

## 2022-01-25 NOTE — Procedures (Signed)
HD note  Patient arrived on the [place] at 0730.  He was diaphoretic and denied pain.  He flinched with every touch made while connecting him for monitoring  for the treatment.  He continued to deny pain. But, when asked what his pain scale was for his arm, he stated it was an 8. He wanted to wait for the end of dialysis to take his Dilaudid.  It was given at 1200. His blood pressure was soft and fluid had to be stopped twice.  The goal was reduced to 2042m.

## 2022-01-26 DIAGNOSIS — G9340 Encephalopathy, unspecified: Secondary | ICD-10-CM | POA: Diagnosis not present

## 2022-01-26 DIAGNOSIS — G9341 Metabolic encephalopathy: Secondary | ICD-10-CM | POA: Diagnosis not present

## 2022-01-26 DIAGNOSIS — R4182 Altered mental status, unspecified: Secondary | ICD-10-CM | POA: Diagnosis not present

## 2022-01-26 DIAGNOSIS — A419 Sepsis, unspecified organism: Secondary | ICD-10-CM | POA: Diagnosis not present

## 2022-01-26 LAB — CBC
HCT: 27.5 % — ABNORMAL LOW (ref 39.0–52.0)
Hemoglobin: 9.1 g/dL — ABNORMAL LOW (ref 13.0–17.0)
MCH: 28.5 pg (ref 26.0–34.0)
MCHC: 33.1 g/dL (ref 30.0–36.0)
MCV: 86.2 fL (ref 80.0–100.0)
Platelets: 278 10*3/uL (ref 150–400)
RBC: 3.19 MIL/uL — ABNORMAL LOW (ref 4.22–5.81)
RDW: 15.2 % (ref 11.5–15.5)
WBC: 19.4 10*3/uL — ABNORMAL HIGH (ref 4.0–10.5)
nRBC: 0 % (ref 0.0–0.2)

## 2022-01-26 LAB — GLUCOSE, CAPILLARY
Glucose-Capillary: 257 mg/dL — ABNORMAL HIGH (ref 70–99)
Glucose-Capillary: 398 mg/dL — ABNORMAL HIGH (ref 70–99)
Glucose-Capillary: 422 mg/dL — ABNORMAL HIGH (ref 70–99)
Glucose-Capillary: 323 mg/dL — ABNORMAL HIGH (ref 70–99)

## 2022-01-26 LAB — RENAL FUNCTION PANEL
Albumin: 1.7 g/dL — ABNORMAL LOW (ref 3.5–5.0)
Anion gap: 18 — ABNORMAL HIGH (ref 5–15)
BUN: 73 mg/dL — ABNORMAL HIGH (ref 8–23)
CO2: 23 mmol/L (ref 22–32)
Calcium: 8.5 mg/dL — ABNORMAL LOW (ref 8.9–10.3)
Chloride: 94 mmol/L — ABNORMAL LOW (ref 98–111)
Creatinine, Ser: 5.81 mg/dL — ABNORMAL HIGH (ref 0.61–1.24)
GFR, Estimated: 10 mL/min — ABNORMAL LOW (ref >60)
Glucose, Bld: 260 mg/dL — ABNORMAL HIGH (ref 70–99)
Phosphorus: 7.5 mg/dL — ABNORMAL HIGH (ref 2.5–4.6)
Potassium: 4.4 mmol/L (ref 3.5–5.1)
Sodium: 135 mmol/L (ref 135–145)

## 2022-01-26 MED ORDER — INSULIN ASPART 100 UNIT/ML IJ SOLN
20.0000 [IU] | Freq: Once | INTRAMUSCULAR | Status: AC
  Administered 2022-01-26: 20 [IU] via SUBCUTANEOUS

## 2022-01-26 MED ORDER — PREDNISONE 5 MG PO TABS
5.0000 mg | ORAL_TABLET | Freq: Every day | ORAL | Status: DC
  Administered 2022-01-27 – 2022-01-29 (×3): 5 mg via ORAL
  Filled 2022-01-26 (×3): qty 1

## 2022-01-26 NOTE — Care Management Important Message (Signed)
Important Message  Patient Details  Name: [name] MRN: [id number] Date of Birth: 1955-06-17   Medicare Important Message Given:  Yes     Orbie Pyo 01/26/2022, 2:53 PM

## 2022-01-26 NOTE — Progress Notes (Signed)
[name] Progress Note   Assessment/ Plan:    AoCKD5, now ESRD  s/p L BC AVF 01/09/22 superficialization, not ready for use S/p [facility] 01/24/22 with IR CLIP complete [name] MWF 2nd shift Started HD 8/17 Next HD 8/26, then on MWF schedule: 2K 4h 400 BFR, 3L UF, no heparin   2.  L knee arthritis, probably gout  - per primary  - ID following   3.  Hyperkalemia             - off Lokelma, K stable; 2K bath   4.  Hyponatremia:             - resolved   5.  Elevated LFTs:             - improved  6.  Encephalopathy:  - combination likely of uremia/ sepsis  - Improving  7.  Afib with RVR:  -s/p dilt gtt--> coreg  - Eliquis on hold  8. Bloody stools / hematochezia; GI FOllowing; EGD/CSY when more stable; per TRH; Hb stable   9.  Dispo: SNF when DC; CLIP is finalized  10. Leukocytosis, stable  Subjective:    ? Hematochezia overnight HD yesterday 2L UF, tolerated well Labs ok here today MOre awake, engaged, and appropriate toda Family at bedside     Objective:   BP 134/71 (BP Location: Right Leg)   Pulse 80   Temp 99 F (37.2 C) (Oral)   Resp 18   Ht '5\' 11"'$  (1.803 m)   Wt 118.9 kg   SpO2 92%   BMI 36.56 kg/m   Intake/Output Summary (Last 24 hours) at 01/26/2022 1154 Last data filed at 01/26/2022 0930 Gross per 24 hour  Intake 60 ml  Output 2100 ml  Net -2040 ml    Weight change: -7.3 kg  Physical Exam: GEN NAD, lying in bed, appears uncomfortable, yelling out HEENT EOMI PERRL NECK NO JVD PULM  clear CV RRR  ABD + pannus EXT no LE edema NEURO able to respond but doesn't always make sense MSK L knee tenderness ACCESS L AVF + T/B, healing well, R IJ nontunneled HD catheter with no blood  Imaging: IR Fluoro Guide CV Line Right  Result Date: 01/24/2022 INDICATION: End-stage renal disease. In need of durable intravenous access for continuation of hemodialysis. Please convert temporary dialysis catheter (placed on 01/18/2022 by Dr. Serafina Royals),  to a tunneled hemodialysis catheter. EXAM: FLUOROSCOPIC GUIDED CONVERSION OF NON TUNNELED TO TUNNELED HEMODIALYSIS CATHETER COMPARISON:  Image guided temporary dialysis catheter placement-01/18/2022 MEDICATIONS: None ANESTHESIA/SEDATION: Moderate (conscious) sedation was employed during this procedure as administered by the Interventional Radiology RN. A total of Versed 1.5 mg and Fentanyl 75 mcg was administered intravenously. Moderate Sedation Time: 20 minutes. The patient's level of consciousness and vital signs were monitored continuously by radiology nursing throughout the procedure under my direct supervision. FLUOROSCOPY TIME:  48 seconds (8 mGy) COMPLICATIONS: None immediate. PROCEDURE: Informed written consent was obtained from the patient's son after a discussion of the risks, benefits, and alternatives to treatment. Questions regarding the procedure were encouraged and answered. The skin and external portion of the existing hemodialysis catheter was prepped with chlorhexidine in a sterile fashion, and a sterile drape was applied covering the operative field. Maximum barrier sterile technique with sterile gowns and gloves were used for the procedure. A timeout was performed prior to the initiation of the procedure. A lumen of the non tunneled temporary dialysis catheter was cannulated with a stiff Glidewire and utilized  for measurement purposes. Next, the stiff Glidewire was advanced to the level of the IVC. A 19 cm tip to cuff palindrome dialysis catheter was tunneled from a site along the anterior chest to the venotomy site. Under intermittent fluoroscopic guidance, the temporary dialysis catheter was exchanged for a peel-away sheath. The tunneled dialysis catheter was inserted into the peel-away sheath with tips ultimately terminating within the superior aspect of the right atrium. Final catheter positioning was confirmed and documented with a spot fluoroscopic image. The catheter aspirates and flushes  normally. The catheter was flushed with appropriate volume heparin dwells. The catheter exit site was secured with a 0-Prolene retention sutures. The venotomy site was closed with 2 interrupted 4 0 Vicryl sutures and the skin was apposed with Dermabond and Steri-Strips. Both lumens were heparinized. A dressing was applied. The patient tolerated the procedure well without immediate post procedural complication. IMPRESSION: Successful fluoroscopic guided conversion of a temporary to a permanent 19 cm tip to cuff tunneled hemodialysis catheter with tips terminating within the superior aspect of the right atrium. The new tunneled dialysis catheter is ready for immediate use. Electronically Signed   By: Sandi Mariscal M.D.   On: 01/24/2022 16:31    Labs: BMET Recent Labs  Lab 01/20/22 0820 01/21/22 0258 01/22/22 0405 01/23/22 0450 01/24/22 0841 01/25/22 0602 01/26/22 0453  NA 133* 130* 135 133* 135 133* 135  K 4.2 3.9 4.0 4.2 3.7 3.8 4.4  CL 95* 92* 95* 92* 95* 94* 94*  CO2 19* '22 22 22 30 23 23  '$ GLUCOSE 159* 233* 181* 304* 72 49* 260*  BUN 177* 140* 110* 140* 95* 116* 73*  CREATININE 9.55* 8.00* 6.83* 7.77* 6.35* 7.60* 5.81*  CALCIUM 7.9* 7.8* 8.1* 8.5* 8.2* 8.3* 8.5*  PHOS 10.2*  --  7.7*  --  6.1* 8.0* 7.5*    CBC Recent Labs  Lab 01/23/22 1834 01/24/22 0415 01/25/22 0602 01/26/22 0453  WBC 18.5* 17.5* 17.0* 19.4*  HGB 8.6* 8.5* 8.3* 9.1*  HCT 26.1* 26.5* 25.2* 27.5*  MCV 86.4 86.9 86.3 86.2  PLT 217 243 254 278     Medications:     abiraterone acetate  1,000 mg Oral Daily   calcitRIOL  0.25 mcg Oral Daily   carvedilol  25 mg Oral BID WC   Chlorhexidine Gluconate Cloth  6 each Topical Q0600   feeding supplement (NEPRO CARB STEADY)  237 mL Oral BID BM   insulin aspart  0-9 Units Subcutaneous TID WC   insulin aspart  6 Units Subcutaneous TID WC   insulin detemir  22 Units Subcutaneous BID   multivitamin  1 tablet Oral QHS   pantoprazole  40 mg Oral BID   [START ON  01/27/2022] predniSONE  5 mg Oral Q breakfast   sodium chloride flush  10-40 mL Intracatheter Q12H    Rexene Agent, MD  01/26/2022, 11:54 AM

## 2022-01-26 NOTE — Progress Notes (Signed)
Physical Therapy Treatment Patient Details Name: [name] MRN: [id number] DOB: 1955/07/22 Today's Date: 01/26/2022   History of Present Illness 66 yo male presents to ED on 8/14 with hypoglycemia, AMS, recent fall, POD6 L AV fistula placement. CTH negative, FOBT +. s/p R and L knee aspiration on 8/14 and 8/15, respectively. Workup for SIRS due to acute gouty arthritis bilat knees. PMH includes CKD stage V, DM-2 with retinopathy and neuropathy, chronic HFpEF, HTN, HLD, prostate cancer, cardiomyopathy.    PT Comments    PT and OT saw pt together given level of physical assist required as well as limited tolerance. Pt overall mobilizing to/from EOB at max +2 level, but tolerated EOB sitting x15 minutes with focus on unsupported sitting with dynamic tasks. Pt is difficult to progress given ongoing bilat knee pain and debility.    Recommendations for follow up therapy are one component of a multi-disciplinary discharge planning process, led by the attending physician.  Recommendations may be updated based on patient status, additional functional criteria and insurance authorization.  Follow Up Recommendations  Skilled nursing-short term rehab (<3 hours/day) Can patient physically be transported by private vehicle: No   Assistance Recommended at Discharge Frequent or constant Supervision/Assistance  Patient can return home with the following Two people to help with walking and/or transfers;A lot of help with bathing/dressing/bathroom;Assistance with cooking/housework;Help with stairs or ramp for entrance;Assist for transportation   Equipment Recommendations  Other (comment) (tbd)    Recommendations for Other Services       Precautions / Restrictions Precautions Precautions: Fall Precaution Comments: R IJ catheter Restrictions Weight Bearing Restrictions: No     Mobility  Bed Mobility Overal bed mobility: Needs Assistance Bed Mobility: Rolling, Supine to Sit, Sit to  Supine Rolling: Max assist   Supine to sit: Max assist, +2 for physical assistance, +2 for safety/equipment Sit to supine: Max assist, +2 for physical assistance, +2 for safety/equipment   General bed mobility comments: pt requires assist to manage trunk and to maneuver BLEs during bed mobility tasks, pt rolling R<>L for pericare + assist needed to roll to R side d/t impaired functional use of LUE    Transfers Overall transfer level: Needs assistance   Transfers: Bed to chair/wheelchair/BSC            Lateral/Scoot Transfers: Max assist, +2 physical assistance General transfer comment: practiced lateral scoots on EOB with pt needing MAX A +2 to scoot hips towards HOB with use of bed pad, encouraged pt to use RUE to assist    Ambulation/Gait                   Stairs             Wheelchair Mobility    Modified Rankin (Stroke Patients Only)       Balance Overall balance assessment: Needs assistance Sitting-balance support: Bilateral upper extremity supported, Feet unsupported Sitting balance-Leahy Scale: Fair   Postural control: Posterior lean     Standing balance comment: NT                            Cognition Arousal/Alertness: Awake/alert Behavior During Therapy: WFL for tasks assessed/performed Overall Cognitive Status: Impaired/Different from baseline Area of Impairment: Safety/judgement, Problem solving, Following commands                       Following Commands: Follows one step commands with increased time Safety/Judgement: Decreased awareness  of deficits   Problem Solving: Slow processing, Requires verbal cues, Requires tactile cues, Decreased initiation General Comments: overall slow to process and slow to initiate tasks        Exercises General Exercises - Lower Extremity Long Arc Quad: AAROM, Both, Seated, 5 reps    General Comments General comments (skin integrity, edema, etc.): PT encouraged ankle pumps  and quad sets for edema management      Pertinent Vitals/Pain Pain Assessment Pain Assessment: Faces Faces Pain Scale: Hurts whole lot Pain Location: B knees/ L arm Pain Descriptors / Indicators: Sore, Discomfort, Grimacing, Guarding, Moaning Pain Intervention(s): Monitored during session, Repositioned, Limited activity within patient's tolerance    Home Living                          Prior Function            PT Goals (current goals can now be found in the care plan section) Acute Rehab PT Goals Patient Stated Goal: return to PLOF PT Goal Formulation: With patient/family Time For Goal Achievement: 01/31/22 Potential to Achieve Goals: Good Progress towards PT goals: Not progressing toward goals - comment    Frequency    Min 2X/week      PT Plan Current plan remains appropriate    Co-evaluation PT/OT/SLP Co-Evaluation/Treatment: Yes Reason for Co-Treatment: For patient/therapist safety;To address functional/ADL transfers PT goals addressed during session: Mobility/safety with mobility;Balance OT goals addressed during session: ADL's and self-care      AM-PAC PT "6 Clicks" Mobility   Outcome Measure  Help needed turning from your back to your side while in a flat bed without using bedrails?: A Lot Help needed moving from lying on your back to sitting on the side of a flat bed without using bedrails?: Total Help needed moving to and from a bed to a chair (including a wheelchair)?: Total Help needed standing up from a chair using your arms (e.g., wheelchair or bedside chair)?: Total Help needed to walk in hospital room?: Total Help needed climbing 3-5 steps with a railing? : Total 6 Click Score: 7    End of Session   Activity Tolerance: Patient tolerated treatment well;Patient limited by fatigue Patient left: with call bell/phone within reach;in bed;with bed alarm set;with family/visitor present Nurse Communication: Mobility status PT Visit  Diagnosis: Other abnormalities of gait and mobility (R26.89)     Time: 7342-8768 PT Time Calculation (min) (ACUTE ONLY): 34 min  Charges:  $Neuromuscular Re-education: 8-22 mins                     Stacie Glaze, PT DPT Acute Rehabilitation Services Pager [phone]  Office [phone]    Louis Matte 01/26/2022, 4:29 PM

## 2022-01-26 NOTE — Progress Notes (Signed)
Dsg has old blood due to blood clotting. Dsg not changed at this time due to patient request. Patient educated that dsg would need to be changed before day is over to protect from infection.

## 2022-01-26 NOTE — Consult Note (Signed)
WOC Nurse Consult Note: Reason for Consult:Partial thickness skin injuries related to incontinence, not pressure. Skin fold moisture associated skin damage (erythema intertrigo). Patient with large liquid tool at the time of my assessment. Also wearing male urinary incontinence external device. The patient's Bedside RN and a Nursing Tech were present and assisted with the assessment today. Wound type:irritant contact dermatitis  ICD-10 CM Codes for Irritant Dermatitis L24A2 - Due to fecal, urinary or dual incontinence L30.4  - Erythema intertrigo. Also used for abrasion of the hand, chafing of the skin, dermatitis due to sweating and friction, friction dermatitis, friction eczema, and genital/thigh intertrigo.   Pressure Injury POA: NA Measurement: Three lesions on right buttock, two lesions on left buttock, sacrm intact, medial thigh lesion on right. Largest measures 0.8cm x 4cm x 0.1cm Wound KKX:FGHW, moist Drainage (amount, consistency, odor) scant serous Periwound:Intact, macerated Dressing procedure/placement/frequency: I have provided Nursing with guidance in the care of the integumentary system to include3: Mattress replacement Skin care to the intertriginous areas of dermatitis at the subpannicular and inguinal skin folds using InterDry, our house antimicrobial moisture wicking textile Kellie Simmering # [id number]) Silicone foam dressings to the partial thickness areas of skin loss on the buttocks Moisture barrier cream (Critic Aid Clear) to the partial thickness areas of skin loss near the areas impacted by fecal incontinence. Prevalon pressure redistribution heel boots Turning and repositioning to minimize time in the supine position A pressure redistribution chair cushion for use when the patient is OOB to a chair. [place] nursing team will not follow, but will remain available to this patient, the nursing and medical teams.  Please re-consult if needed.  Thank you for inviting [place] to participate  in this patient's Plan of Care.  Maudie Flakes, MSN, RN, CNS, [place], Serita Grammes, [employer], [employer] phone:  [phone]

## 2022-01-26 NOTE — Progress Notes (Signed)
Occupational Therapy Treatment Patient Details Name: [name] MRN: [id number] DOB: 1956-02-13 Today's Date: 01/26/2022   History of present illness 66 yo male presents to ED on 8/14 with hypoglycemia, AMS, recent fall, POD6 L AV fistula placement. CTH negative, FOBT +. s/p R and L knee aspiration on 8/14 and 8/15, respectively. Workup for SIRS due to acute gouty arthritis bilat knees. PMH includes CKD stage V, DM-2 with retinopathy and neuropathy, chronic HFpEF, HTN, HLD, prostate cancer, cardiomyopathy.   OT comments  Pt seen in conjunction with PT to maximize pts activity tolerance and optimize pt participation. Pt continues to present with increased pain, decreased activity tolerance and impaired strength/endurance. Pt currently requires MAX A +2 for bed mobility and MAX  +2 to complete lateral scoots from EOB >HOB. Pt engaged in seated grooming tasks from EOB with pt needing supervision for UB ADLS and min guard for LB ADLs. Increased edema noted in LUE, issued pt squeeze ball and provided education on edema mgmt strategies. Pt would continue to benefit from skilled occupational therapy while admitted and after d/c to address the below listed limitations in order to improve overall functional mobility and facilitate independence with BADL participation. DC plan remains appropriate, will follow acutely per POC.      Recommendations for follow up therapy are one component of a multi-disciplinary discharge planning process, led by the attending physician.  Recommendations may be updated based on patient status, additional functional criteria and insurance authorization.    Follow Up Recommendations  Skilled nursing-short term rehab (<3 hours/day)    Assistance Recommended at Discharge Frequent or constant Supervision/Assistance  Patient can return home with the following  Two people to help with bathing/dressing/bathroom;Assistance with cooking/housework;Direct supervision/assist for  medications management;Direct supervision/assist for financial management;Assist for transportation;Help with stairs or ramp for entrance;Two people to help with walking and/or transfers   Equipment Recommendations  Other (comment) (TBD)    Recommendations for Other Services      Precautions / Restrictions Precautions Precautions: Fall Precaution Comments: R IJ catheter Restrictions Weight Bearing Restrictions: No       Mobility Bed Mobility Overal bed mobility: Needs Assistance Bed Mobility: Rolling, Supine to Sit, Sit to Supine Rolling: Max assist   Supine to sit: Max assist, +2 for physical assistance, +2 for safety/equipment Sit to supine: Max assist, +2 for physical assistance, +2 for safety/equipment   General bed mobility comments: pt requires assist to manage trunk and to maneuver BLEs during bed mobility tasks, pt rolling R<>L for pericare + assist needed to roll to R side d/t impaired functional use of LUE    Transfers Overall transfer level: Needs assistance                 General transfer comment: practiced lateral scoots on EOB with pt needing MAX A +2 to scoot hips towards HOB with use of bed pad     Balance Overall balance assessment: Needs assistance Sitting-balance support: Bilateral upper extremity supported, Feet unsupported Sitting balance-Leahy Scale: Fair Sitting balance - Comments: able to elevate EOB to allow for no BLE support with pt able to maintain static sitting balance wit BUE support and close min guard, no LOB       Standing balance comment: NT                           ADL either performed or assessed with clinical judgement   ADL Overall ADL's : Needs assistance/impaired  Grooming: Wash/dry face;Oral care;Sitting;Supervision/safety;Set up Grooming Details (indicate cue type and reason): sitting EOB     Lower Body Bathing: Min guard;Sitting/lateral leans Lower Body Bathing Details (indicate cue type and  reason): simulated by applying lotion to BLEs, min guard assist to lean forward to apply lotion to lower legs     Lower Body Dressing: Total assistance;+2 for physical assistance;+2 for safety/equipment;Bed level Lower Body Dressing Details (indicate cue type and reason): to don socks     Toileting- Clothing Manipulation and Hygiene: Total assistance;+2 for physical assistance;Bed level Toileting - Clothing Manipulation Details (indicate cue type and reason): posterior pericare from incontinent stools from bed level     Functional mobility during ADLs: Maximal assistance;+2 for physical assistance;+2 for safety/equipment (lateral scoots on EOB) General ADL Comments: ADL participation impacted by increased pain, decreased activity tolerance and impaired strength/endurance    Extremity/Trunk Assessment Upper Extremity Assessment Upper Extremity Assessment: Generalized weakness;LUE deficits/detail LUE Deficits / Details: increased swelling in L hand , impaired grip strength, issues pt squeeze ball for edema mgmt LUE: Unable to fully assess due to pain LUE Sensation: decreased light touch LUE Coordination: decreased fine motor;decreased gross motor   Lower Extremity Assessment Lower Extremity Assessment: Defer to PT evaluation        Vision Baseline Vision/History: 1 Wears glasses Ability to See in Adequate Light: 0 Adequate Patient Visual Report: No change from baseline     Perception Perception Perception: Within Functional Limits   Praxis Praxis Praxis: Intact    Cognition Arousal/Alertness: Awake/alert Behavior During Therapy: WFL for tasks assessed/performed Overall Cognitive Status: Impaired/Different from baseline Area of Impairment: Safety/judgement, Problem solving, Following commands                       Following Commands: Follows one step commands with increased time Safety/Judgement: Decreased awareness of deficits   Problem Solving: Slow  processing, Requires verbal cues, Requires tactile cues, Decreased initiation General Comments: overall slow to process and slow to initiate tasks        Exercises      Shoulder Instructions       General Comments pt on 1 L [place], SpO2 WFL, pts son present during session, noted blister on L hand, increased edema noted in BLEs and L hand- asked RN to order SCDs for BLEs and education provided on edema mgmt with squeeze ball provided LUE elevated on pillow at end of session    Pertinent Vitals/ Pain       Pain Assessment Pain Assessment: Faces Faces Pain Scale: Hurts whole lot Pain Location: B knees/ L arm Pain Descriptors / Indicators: Sore, Discomfort, Grimacing, Guarding, Moaning Pain Intervention(s): Limited activity within patient's tolerance, Monitored during session, Repositioned  Home Living                                          Prior Functioning/Environment              Frequency  Min 2X/week        Progress Toward Goals  OT Goals(current goals can now be found in the care plan section)  Progress towards OT goals: Progressing toward goals (gradually)  Acute Rehab OT Goals Time For Goal Achievement: 01/23/22 Potential to Achieve Goals: [place] Discharge plan remains appropriate;Frequency remains appropriate    Co-evaluation    PT/OT/SLP Co-Evaluation/Treatment: Yes Reason for Co-Treatment: To address  functional/ADL transfers;For patient/therapist safety   OT goals addressed during session: ADL's and self-care      AM-PAC OT "6 Clicks" Daily Activity     Outcome Measure   Help from another person eating meals?: A Little Help from another person taking care of personal grooming?: A Little Help from another person toileting, which includes using toliet, bedpan, or urinal?: Total Help from another person bathing (including washing, rinsing, drying)?: A Lot Help from another person to put on and taking off regular upper body  clothing?: A Lot Help from another person to put on and taking off regular lower body clothing?: Total 6 Click Score: 12    End of Session    OT Visit Diagnosis: Pain;Muscle weakness (generalized) (M62.81);Other symptoms and signs involving cognitive function Pain - Right/Left: Left Pain - part of body:  (BLE; LUE)   Activity Tolerance Patient tolerated treatment well   Patient Left in bed;with call bell/phone within reach;with bed alarm set   Nurse Communication Mobility status;Other (comment) (via secure chat)        Time: 9211-9417 OT Time Calculation (min): 39 min  Charges: OT General Charges $OT Visit: 1 Visit OT Treatments $Self Care/Home Management : 8-22 mins  Harley Alto., COTA/L Acute Rehabilitation Services [phone]  Precious Haws 01/26/2022, 4:00 PM

## 2022-01-26 NOTE — Plan of Care (Signed)

## 2022-01-26 NOTE — Progress Notes (Signed)
Multiple open areas on buttocks and sacral area. When Mepaplex is taken off more skin comes off. Needs Wound Consult

## 2022-01-26 NOTE — Inpatient Diabetes Management (Signed)
Inpatient Diabetes Program Recommendations  AACE/ADA: New Consensus Statement on Inpatient Glycemic Control (2015)  Target Ranges:  Prepandial:   less than 140 mg/dL      Peak postprandial:   less than 180 mg/dL (1-2 hours)      Critically ill patients:  140 - 180 mg/dL   Lab Results  Component Value Date   GLUCAP 398 (H) 01/26/2022   HGBA1C 9.6 (H) 01/15/2022    Latest Reference Range & Units 01/25/22 16:58 01/25/22 21:35 01/26/22 08:12 01/26/22 11:51  Glucose-Capillary 70 - 99 mg/dL 168 (H) 248 (H) 257 (H) 398 (H)  (H): Data is abnormally high Review of Glycemic Control  Diabetes history: type 2 Outpatient Diabetes medications: Levemir 40 units BID, Glucotrol 10 mg BID Current orders for Inpatient glycemic control: Levemir 22 units BID, Novolog 0-9 units TID, Novolog 6 units TID  Inpatient Diabetes Program Recommendations:   Noted that CBGs have been greater than 180 mg/dl. Recommend increasing Levemir to 24 units BID. Noted that Novolog 6 units TID has not been given today since the patient is not eating. Titrate dosages as needed.   Will continue to monitor blood sugars while in the hospital.  Harvel Ricks RN BSN CDE Diabetes Coordinator Pager: [phone]  8am-5pm

## 2022-01-26 NOTE — Progress Notes (Signed)
PROGRESS NOTE        PATIENT DETAILS Name: [name] Age: 66 y.o. Sex: male Date of Birth: 1955-06-24 Admit Date: 01/15/2022 Admitting Physician Lequita Halt, MD VCB:SWHQP, Malva Limes., FNP  Brief Summary: Patient is a 66 y.o.  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  See below for further details.  Significant events: 8/14>> admit for confusion/gouty arthritis/progression to ESRD. 8/16>> worsening transaminases-renal planning HD. 8/17>> TDC placed-first HD planned. 8/18>> Afib RVR with first HD session-HD cut short. Spontaneously converted to sinus rhythm.Oozing from nontunneled HD cath site.  8/19>> Brief run of RVR.  Tolerated HD without any incident. 8/20>> Worsening encephalopathy-urgent HD, 2 episodes of painless small-volume hematochezia. Hemoglobin dropped to 6.7-given 2 units of PRBC with HD.  8/21>> less encephalopathic-answering simple questions-another episode (third) of hematochezia earlier this morning. 8/24>> intra-articular steroid injections to bilateral knee 8/25>> small-volume hematochezia reoccurred  Significant studies: 8/15>> MRI brain: No acute CVA. 8/15>> renal ultrasound: No hydronephrosis. 8/14>> left knee synovial fluid: WBC 23,835, extracellular sodium urate crystals. 8/15>> right knee synovial fluid: WBC 15,600, intracellular monosodium urate crystals. 8/16>> Echo: EF 55-60% 8/16>> RUQ ultrasound: No major abnormalities noted.  Significant microbiology data: 8/14>> blood culture: No growth 8/14>> left knee synovial fluid: No growth 8/15>> right knee synovial fluid: No growth  Procedures: 8/14>> left knee arthrocentesis 8/15>> right knee arthrocentesis 8/17>> tunneled HD catheter placement by IR. 8/24>> intra-articular  steroid injection to bilateral knee by orthopedics.  Consults: Nephrology. Orthopedics Cardiology Vascular ID GI  Subjective: Small volume hematochezia earlier this morning per nursing staff.  Awake/alert but still weak/deconditioned.  Some improvement in arthralgias involving bilateral knees.   Objective: Vitals: Blood pressure 136/69, pulse 81, temperature 98.5 F (36.9 C), temperature source Oral, resp. rate 20, height '5\' 11"'$  (1.803 m), weight 118.9 kg, SpO2 94 %.   Exam: Gen Exam:Alert awake-not in any distress-chronically sick/frail appearing. HEENT:atraumatic, normocephalic Chest: B/L clear to auscultation anteriorly CVS:S1S2 regular Abdomen:soft non tender, non distended Extremities: Left upper extremity still swollen-unchanged for the past week or so.  Slight decrease in tenderness in the bilateral knees on palpation. Neurology: Non focal Skin: no rash   Pertinent Labs/Radiology:    Latest Ref Rng & Units 01/26/2022    4:53 AM 01/25/2022    6:02 AM 01/24/2022    4:15 AM  CBC  WBC 4.0 - 10.5 K/uL 19.4  17.0  17.5   Hemoglobin 13.0 - 17.0 g/dL 9.1  8.3  8.5   Hematocrit 39.0 - 52.0 % 27.5  25.2  26.5   Platelets 150 - 400 K/uL 278  254  243     Lab Results  Component Value Date   NA 135 01/26/2022   K 4.4 01/26/2022   CL 94 (L) 01/26/2022   CO2 23 01/26/2022     Assessment/Plan: SIRS due to acute gouty arthritis involving bilateral knees, numerous joints of left hand: Although overall improved-continues to have arthralgias-no longer on antibiotics as all cultures negative.  After discussion with orthopedic-Dr. Alvan Dame on 8/24-underwent bilateral intra-articular steroid injections.  Some improvement in arthralgias post intra-articular steroid injections.  Per Dr. Elijah Birk likely has some underlying osteoarthritis as well.  Note-septic arthritis ruled out-no evidence of sepsis.  CKD stage V with progression to ESRD: Uremic symptoms have improved-no longer  encephalopathic-nephrology following and directing HD care.  Hyperkalemia: Due to worsening renal function-resolved with Lokelma and HD.  Continue to monitor periodically.  Oozing from tunneled HD catheter site: Evaluated by IR on 8/18-bleeding site sutured-no further bleeding evident.  A-fib with RVR: Triggered by uremia/acute illness-now maintaining sinus rhythm on oral beta-blocker-Eliquis on hold due to ongoing lower GI bleeding.  Cardiology has signed off.  Lower GI bleeding: After having no hematochezia x3 days-patient had a small-volume hematochezia earlier this morning.  Difficult situation-very deconditioned/frail-Per GI-will not be able to tolerate a colonoscopy prep.  GI signed off with plans to reconsult GI once patient's deconditioning improved to an extent that he would be able to tolerate a colonoscopy prep.  Per GI-continue to hold Eliquis for 5 days from 8/22-however patient has had another episode of hematochezia earlier this morning.  Follow CBC and transfuse accordingly.  If hematochezia worsens-becomes more frequent-we will need to reconsult GI.    Normocytic anemia: Multifactorial etiology-due to acute illness/CKD/acute blood loss anemia due to lower GI bleeding.  Transfused 2 units of PRBC on 8/20-hemoglobin remains relatively stable.  Continue to follow CBC.  Acute metabolic encephalopathy: Due to AKI/uremia-improving.  Much more awake and alert today.    Transaminitis: Unclear etiology-suspect probably medication related-possibly related to cefepime.  Statin/Zytiga was held-LFTs have improved.  RUQ ultrasound was unremarkable.    Hyponatremia: Related to volume-improved with HD.  Hypoglycemia: Likely related to use of oral hypoglycemic agents in the setting of worsening renal function.  Continue to watch closely.  DM-2 (A1c 9.6 on 8/14) with uncontrolled hyperglycemia due to steroids: CBGs stable overnight-continue Levemir 23 units twice daily, continue Premeal NovoLog 6  units and SSI.  Follow and optimize.  Will need to permanently stop glipizide and metformin on discharge.  Recent Labs    01/25/22 1658 01/25/22 2135 01/26/22 0812  GLUCAP 168* 248* 257*     Chronic left arm weakness: Per patient's son-this has been ongoing for the past several months-son has noted a problem with the patient's grip mostly.  Suspect this is worsened due to left arm swelling/left hand arthralgias.  MRI brain negative for CVA..  Once patient's debility improves more-we will need to pursue a C-spine MRI at some point.  Superficialization of left upper extremity AV fistula by vascular surgery on 8/8: With some residual arm swelling that seems to have worsened over the past few days-appreciate vascular surgery evaluation.  Prostate cancer: On prednisone/Zytiga-currently on higher doses of prednisone than usual-Zytiga held due to elevated LFTs but since they have improved-Zytiga has been resumed.  Morbid Obesity: Estimated body mass index is 36.56 kg/m as calculated from the following:   Height as of this encounter: '5\' 11"'$  (1.803 m).   Weight as of this encounter: 118.9 kg.   Code status:   Code Status: Full Code   DVT Prophylaxis:Hold Eliquis due to ongoing hematochezia.   Family Communication: Son at bedside on 8/24   Disposition Plan: Status is: Inpatient Remains inpatient appropriate because: Likely progression to ESRD-nephrology initiating dialysis-remains encephalopathic-continues to have intermittent hematochezia-needs colonoscopy-not yet stable for discharge.   Planned Discharge Destination:SNF  Diet: Diet Order             Diet renal/carb modified with fluid restriction Diet-HS Snack? Nothing; Fluid restriction: 1200 mL Fluid; Room service appropriate? Yes; Fluid consistency: Thin  Diet effective now  Antimicrobial agents: Anti-infectives (From admission, onward)    Start     Dose/Rate Route Frequency Ordered Stop   01/24/22  1535  ceFAZolin (ANCEF) IVPB 2g/100 mL premix        over 30 Minutes Intravenous Continuous PRN 01/24/22 1535 01/24/22 1535   01/24/22 1445  ceFAZolin (ANCEF) IVPB 2g/100 mL premix  Status:  Discontinued        2 g 200 mL/hr over 30 Minutes Intravenous To Radiology 01/24/22 1359 01/25/22 1341   01/16/22 1000  ceFEPIme (MAXIPIME) 2 g in sodium chloride 0.9 % 100 mL IVPB  Status:  Discontinued        2 g 200 mL/hr over 30 Minutes Intravenous Every 24 hours 01/15/22 1014 01/17/22 1355   01/15/22 1015  ceFEPIme (MAXIPIME) 2 g in sodium chloride 0.9 % 100 mL IVPB        2 g 200 mL/hr over 30 Minutes Intravenous  Once 01/15/22 1005 01/15/22 1127   01/15/22 1015  metroNIDAZOLE (FLAGYL) IVPB 500 mg        500 mg 100 mL/hr over 60 Minutes Intravenous  Once 01/15/22 1005 01/15/22 1213   01/15/22 1015  vancomycin (VANCOCIN) IVPB 1000 mg/200 mL premix  Status:  Discontinued        1,000 mg 200 mL/hr over 60 Minutes Intravenous  Once 01/15/22 1005 01/15/22 1013   01/15/22 1015  vancomycin (VANCOREADY) IVPB 2000 mg/400 mL        2,000 mg 200 mL/hr over 120 Minutes Intravenous  Once 01/15/22 1013 01/15/22 1325   01/15/22 1011  vancomycin variable dose per unstable renal function (pharmacist dosing)  Status:  Discontinued         Does not apply See admin instructions 01/15/22 1011 01/18/22 1526        MEDICATIONS: Scheduled Meds:  abiraterone acetate  1,000 mg Oral Daily   calcitRIOL  0.25 mcg Oral Daily   carvedilol  25 mg Oral BID WC   Chlorhexidine Gluconate Cloth  6 each Topical Q0600   feeding supplement (NEPRO CARB STEADY)  237 mL Oral BID BM   insulin aspart  0-9 Units Subcutaneous TID WC   insulin aspart  6 Units Subcutaneous TID WC   insulin detemir  22 Units Subcutaneous BID   multivitamin  1 tablet Oral QHS   pantoprazole  40 mg Oral BID   predniSONE  10 mg Oral Q breakfast   sodium chloride flush  10-40 mL Intracatheter Q12H   Continuous Infusions:   PRN Meds:.acetaminophen,  hydrALAZINE, HYDROmorphone (DILAUDID) injection, lidocaine (PF), lidocaine-prilocaine, metoprolol tartrate, ondansetron (ZOFRAN) IV, pentafluoroprop-tetrafluoroeth, sodium chloride   I have personally reviewed following labs and imaging studies  LABORATORY DATA: CBC: Recent Labs  Lab 01/23/22 0450 01/23/22 1834 01/24/22 0415 01/25/22 0602 01/26/22 0453  WBC 19.2* 18.5* 17.5* 17.0* 19.4*  HGB 8.7* 8.6* 8.5* 8.3* 9.1*  HCT 26.0* 26.1* 26.5* 25.2* 27.5*  MCV 86.1 86.4 86.9 86.3 86.2  PLT 196 217 243 254 278     Basic Metabolic Panel: Recent Labs  Lab 01/20/22 0820 01/21/22 0258 01/22/22 0405 01/23/22 0450 01/24/22 0841 01/25/22 0602 01/26/22 0453  NA 133*   < > 135 133* 135 133* 135  K 4.2   < > 4.0 4.2 3.7 3.8 4.4  CL 95*   < > 95* 92* 95* 94* 94*  CO2 19*   < > '22 22 30 23 23  '$ GLUCOSE 159*   < > 181* 304* 72 49* 260*  BUN 177*   < >  110* 140* 95* 116* 73*  CREATININE 9.55*   < > 6.83* 7.77* 6.35* 7.60* 5.81*  CALCIUM 7.9*   < > 8.1* 8.5* 8.2* 8.3* 8.5*  MG  --   --   --   --   --  1.9  --   PHOS 10.2*  --  7.7*  --  6.1* 8.0* 7.5*   < > = values in this interval not displayed.     GFR: Estimated Creatinine Clearance: 16 mL/min (A) (by C-G formula based on SCr of 5.81 mg/dL (H)).  Liver Function Tests: Recent Labs  Lab 01/20/22 0343 01/20/22 0820 01/21/22 0258 01/22/22 0405 01/23/22 0450 01/24/22 0841 01/25/22 0602 01/26/22 0453  AST 149*  --  75*  --  35  --   --   --   ALT 95*  --  72*  --  48*  --   --   --   ALKPHOS 94  --  83  --  81  --   --   --   BILITOT 0.8  --  1.0  --  0.6  --   --   --   PROT 5.8*  --  5.5*  --  5.9*  --   --   --   ALBUMIN 1.6*   < > 1.6* 1.6* 1.7* 1.6* 1.6* 1.7*   < > = values in this interval not displayed.    No results for input(s): "LIPASE", "AMYLASE" in the last 168 hours. No results for input(s): "AMMONIA" in the last 168 hours.   Coagulation Profile: No results for input(s): "INR", "PROTIME" in the last 168  hours.   Cardiac Enzymes: No results for input(s): "CKTOTAL", "CKMB", "CKMBINDEX", "TROPONINI" in the last 168 hours.  BNP (last 3 results) No results for input(s): "PROBNP" in the last 8760 hours.  Lipid Profile: No results for input(s): "CHOL", "HDL", "LDLCALC", "TRIG", "CHOLHDL", "LDLDIRECT" in the last 72 hours.  Thyroid Function Tests: No results for input(s): "TSH", "T4TOTAL", "FREET4", "T3FREE", "THYROIDAB" in the last 72 hours.   Anemia Panel: No results for input(s): "VITAMINB12", "FOLATE", "FERRITIN", "TIBC", "IRON", "RETICCTPCT" in the last 72 hours.   Urine analysis:    Component Value Date/Time   COLORURINE YELLOW 05/04/2008 2129   APPEARANCEUR Cloudy (A) 08/19/2015 1403   LABSPEC 1.012 05/04/2008 2129   PHURINE 5.5 05/04/2008 2129   GLUCOSEU Negative 08/19/2015 1403   GLUCOSEU 500 (A) 05/04/2008 2129   HGBUR SMALL (A) 04/22/2008 0848   BILIRUBINUR Negative 08/19/2015 1403   KETONESUR NEG mg/dL 05/04/2008 2129   PROTEINUR 1+ (A) 08/19/2015 1403   PROTEINUR 30 (A) 05/04/2008 2129   UROBILINOGEN 0.2 05/04/2008 2129   NITRITE Negative 08/19/2015 1403   NITRITE NEG 05/04/2008 2129   LEUKOCYTESUR 1+ (A) 08/19/2015 1403    Sepsis Labs: Lactic Acid, Venous    Component Value Date/Time   LATICACIDVEN 2.6 (HH) 01/15/2022 1026    MICROBIOLOGY: Recent Results (from the past 240 hour(s))  Body fluid culture w Gram Stain     Status: None   Collection Time: 01/16/22 11:48 AM   Specimen: Synovium; Body Fluid  Result Value Ref Range Status   Specimen Description SYNOVIAL  Final   Special Requests  RIGHT KNEE  Final   Gram Stain   Final    ABUNDANT WBC PRESENT, PREDOMINANTLY PMN NO ORGANISMS SEEN    Culture   Final    NO GROWTH 3 DAYS Performed at [facility], [address],  [place] [address]    Report Status 01/19/2022 FINAL  Final    RADIOLOGY STUDIES/RESULTS: IR Fluoro Guide CV Line Right  Result Date: 01/24/2022 INDICATION:  End-stage renal disease. In need of durable intravenous access for continuation of hemodialysis. Please convert temporary dialysis catheter (placed on 01/18/2022 by Dr. Serafina Royals), to a tunneled hemodialysis catheter. EXAM: FLUOROSCOPIC GUIDED CONVERSION OF NON TUNNELED TO TUNNELED HEMODIALYSIS CATHETER COMPARISON:  Image guided temporary dialysis catheter placement-01/18/2022 MEDICATIONS: None ANESTHESIA/SEDATION: Moderate (conscious) sedation was employed during this procedure as administered by the Interventional Radiology RN. A total of Versed 1.5 mg and Fentanyl 75 mcg was administered intravenously. Moderate Sedation Time: 20 minutes. The patient's level of consciousness and vital signs were monitored continuously by radiology nursing throughout the procedure under my direct supervision. FLUOROSCOPY TIME:  48 seconds (8 mGy) COMPLICATIONS: None immediate. PROCEDURE: Informed written consent was obtained from the patient's son after a discussion of the risks, benefits, and alternatives to treatment. Questions regarding the procedure were encouraged and answered. The skin and external portion of the existing hemodialysis catheter was prepped with chlorhexidine in a sterile fashion, and a sterile drape was applied covering the operative field. Maximum barrier sterile technique with sterile gowns and gloves were used for the procedure. A timeout was performed prior to the initiation of the procedure. A lumen of the non tunneled temporary dialysis catheter was cannulated with a stiff Glidewire and utilized for measurement purposes. Next, the stiff Glidewire was advanced to the level of the IVC. A 19 cm tip to cuff palindrome dialysis catheter was tunneled from a site along the anterior chest to the venotomy site. Under intermittent fluoroscopic guidance, the temporary dialysis catheter was exchanged for a peel-away sheath. The tunneled dialysis catheter was inserted into the peel-away sheath with tips ultimately  terminating within the superior aspect of the right atrium. Final catheter positioning was confirmed and documented with a spot fluoroscopic image. The catheter aspirates and flushes normally. The catheter was flushed with appropriate volume heparin dwells. The catheter exit site was secured with a 0-Prolene retention sutures. The venotomy site was closed with 2 interrupted 4 0 Vicryl sutures and the skin was apposed with Dermabond and Steri-Strips. Both lumens were heparinized. A dressing was applied. The patient tolerated the procedure well without immediate post procedural complication. IMPRESSION: Successful fluoroscopic guided conversion of a temporary to a permanent 19 cm tip to cuff tunneled hemodialysis catheter with tips terminating within the superior aspect of the right atrium. The new tunneled dialysis catheter is ready for immediate use. Electronically Signed   By: Sandi Mariscal M.D.   On: 01/24/2022 16:31     LOS: 11 days   Oren Binet, MD  [facility]    To contact the attending provider between 7A-7P or the covering provider during after hours 7P-7A, please log into the web site [email or web address] and access using universal [place] password for that web site. If you do not have the password, please call the hospital operator.  01/26/2022, 11:11 AM

## 2022-01-27 DIAGNOSIS — G9341 Metabolic encephalopathy: Secondary | ICD-10-CM | POA: Diagnosis not present

## 2022-01-27 DIAGNOSIS — R4182 Altered mental status, unspecified: Secondary | ICD-10-CM | POA: Diagnosis not present

## 2022-01-27 DIAGNOSIS — G9340 Encephalopathy, unspecified: Secondary | ICD-10-CM | POA: Diagnosis not present

## 2022-01-27 DIAGNOSIS — A419 Sepsis, unspecified organism: Secondary | ICD-10-CM | POA: Diagnosis not present

## 2022-01-27 LAB — CBC
HCT: 25.1 % — ABNORMAL LOW (ref 39.0–52.0)
Hemoglobin: 7.9 g/dL — ABNORMAL LOW (ref 13.0–17.0)
MCH: 27.6 pg (ref 26.0–34.0)
MCHC: 31.5 g/dL (ref 30.0–36.0)
MCV: 87.8 fL (ref 80.0–100.0)
Platelets: 309 10*3/uL (ref 150–400)
RBC: 2.86 MIL/uL — ABNORMAL LOW (ref 4.22–5.81)
RDW: 15.1 % (ref 11.5–15.5)
WBC: 17.3 10*3/uL — ABNORMAL HIGH (ref 4.0–10.5)
nRBC: 0 % (ref 0.0–0.2)

## 2022-01-27 LAB — RENAL FUNCTION PANEL
Albumin: 1.6 g/dL — ABNORMAL LOW (ref 3.5–5.0)
Anion gap: 15 (ref 5–15)
BUN: 116 mg/dL — ABNORMAL HIGH (ref 8–23)
CO2: 23 mmol/L (ref 22–32)
Calcium: 8.3 mg/dL — ABNORMAL LOW (ref 8.9–10.3)
Chloride: 93 mmol/L — ABNORMAL LOW (ref 98–111)
Creatinine, Ser: 7.8 mg/dL — ABNORMAL HIGH (ref 0.61–1.24)
GFR, Estimated: 7 mL/min — ABNORMAL LOW (ref >60)
Glucose, Bld: 278 mg/dL — ABNORMAL HIGH (ref 70–99)
Phosphorus: 8.7 mg/dL — ABNORMAL HIGH (ref 2.5–4.6)
Potassium: 4.1 mmol/L (ref 3.5–5.1)
Sodium: 131 mmol/L — ABNORMAL LOW (ref 135–145)

## 2022-01-27 LAB — GLUCOSE, CAPILLARY
Glucose-Capillary: 278 mg/dL — ABNORMAL HIGH (ref 70–99)
Glucose-Capillary: 137 mg/dL — ABNORMAL HIGH (ref 70–99)
Glucose-Capillary: 282 mg/dL — ABNORMAL HIGH (ref 70–99)
Glucose-Capillary: 189 mg/dL — ABNORMAL HIGH (ref 70–99)

## 2022-01-27 MED ORDER — INSULIN DETEMIR 100 UNIT/ML ~~LOC~~ SOLN
26.0000 [IU] | Freq: Two times a day (BID) | SUBCUTANEOUS | Status: DC
Start: 2022-01-27 — End: 2022-01-29
  Administered 2022-01-27 – 2022-01-28 (×4): 26 [IU] via SUBCUTANEOUS
  Filled 2022-01-27 (×6): qty 0.26

## 2022-01-27 MED ORDER — HEPARIN SODIUM (PORCINE) 1000 UNIT/ML IJ SOLN
INTRAMUSCULAR | Status: AC
  Administered 2022-01-27: 4000 [IU]
  Filled 2022-01-27: qty 3

## 2022-01-27 MED ORDER — INSULIN ASPART 100 UNIT/ML IJ SOLN
8.0000 [IU] | Freq: Three times a day (TID) | INTRAMUSCULAR | Status: DC
Start: 2022-01-27 — End: 2022-02-02
  Administered 2022-01-27 – 2022-02-01 (×12): 8 [IU] via SUBCUTANEOUS

## 2022-01-27 MED ORDER — HEPARIN SODIUM (PORCINE) 1000 UNIT/ML IJ SOLN
INTRAMUSCULAR | Status: AC
  Filled 2022-01-27: qty 1

## 2022-01-27 NOTE — Procedures (Signed)
I was present at this dialysis session. I have reviewed the session itself and made appropriate changes.   2K bath, 3L UF goal using TDC. Tolerating well, no c/o.  AML pending.   Filed Weights   01/25/22 0500 01/25/22 1232 01/26/22 0500  Weight: 125.9 kg 118.6 kg 118.9 kg    Recent Labs  Lab 01/26/22 0453  NA 135  K 4.4  CL 94*  CO2 23  GLUCOSE 260*  BUN 73*  CREATININE 5.81*  CALCIUM 8.5*  PHOS 7.5*    Recent Labs  Lab 01/24/22 0415 01/25/22 0602 01/26/22 0453  WBC 17.5* 17.0* 19.4*  HGB 8.5* 8.3* 9.1*  HCT 26.5* 25.2* 27.5*  MCV 86.9 86.3 86.2  PLT 243 254 278    Scheduled Meds:  abiraterone acetate  1,000 mg Oral Daily   calcitRIOL  0.25 mcg Oral Daily   carvedilol  25 mg Oral BID WC   Chlorhexidine Gluconate Cloth  6 each Topical Q0600   feeding supplement (NEPRO CARB STEADY)  237 mL Oral BID BM   insulin aspart  0-9 Units Subcutaneous TID WC   insulin aspart  8 Units Subcutaneous TID WC   insulin detemir  26 Units Subcutaneous BID   multivitamin  1 tablet Oral QHS   pantoprazole  40 mg Oral BID   predniSONE  5 mg Oral Q breakfast   sodium chloride flush  10-40 mL Intracatheter Q12H   Continuous Infusions: PRN Meds:.acetaminophen, hydrALAZINE, HYDROmorphone (DILAUDID) injection, lidocaine (PF), lidocaine-prilocaine, metoprolol tartrate, ondansetron (ZOFRAN) IV, pentafluoroprop-tetrafluoroeth, sodium chloride   Pearson Grippe  MD 01/27/2022, 7:42 AM

## 2022-01-27 NOTE — Plan of Care (Signed)

## 2022-01-27 NOTE — Progress Notes (Signed)
PROGRESS NOTE        PATIENT DETAILS Name: [name] Age: 66 y.o. Sex: male Date of Birth: [birth year] Admit Date: 01/15/2022 Admitting Physician Lequita Halt, MD [facility]G:GEZMO, Malva Limes., FNP  Brief Summary: Patient is a 66 y.o.  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  Started on IV steroids for gouty arthritis-evaluated by nephrology and started on HD.  Hospital course complicated by A-fib RVR, lower GI bleeding and slow clinical improvement.  See below for further details.    Significant events: 8/14>> admit for confusion/gouty arthritis/progression to ESRD. 8/16>> worsening transaminases/encephalopathy/renal function-renal planning HD. 8/17>> TDC placed-first HD planned. 8/18>> Afib RVR with first HD session-HD cut short. Spontaneously converted to sinus rhythm.Oozing from nontunneled HD cath site.  8/19>> Brief run of RVR.  Tolerated HD without any incident. 8/20>> Worsening encephalopathy-urgent HD, 2 episodes of painless small-volume hematochezia. Hemoglobin dropped to 6.7-given 2 units of PRBC with HD.  8/21>> less encephalopathic-answering simple questions-another episode (third) of hematochezia earlier this morning. 8/24>> intra-articular steroid injections to bilateral knee 8/25>> small-volume hematochezia reoccurred  Significant studies: 8/15>> MRI brain: No acute CVA. 8/15>> renal ultrasound: No hydronephrosis. 8/14>> left knee synovial fluid: WBC 23,835, extracellular sodium urate crystals. 8/15>> right knee synovial fluid: WBC 15,600, intracellular monosodium urate crystals. 8/16>> Echo: EF 55-60% 8/16>> RUQ ultrasound: No major abnormalities noted.  Significant microbiology data: 8/14>> blood culture: No growth 8/14>> left  knee synovial fluid: No growth 8/15>> right knee synovial fluid: No growth  Procedures: 8/14>> left knee arthrocentesis 8/15>> right knee arthrocentesis 8/17>> tunneled HD catheter placement by IR. 8/24>> intra-articular steroid injection to bilateral knee by orthopedics.  Consults: Nephrology. Orthopedics Cardiology Vascular ID GI  Subjective: Slow but more awake/alert today.  Denies any hematochezia overnight.   Objective: Vitals: Blood pressure (!) 152/54, pulse (!) 46, temperature 97.9 F (36.6 C), temperature source Oral, resp. rate 17, height '5\' 11"'$  (1.803 m), weight 118.9 kg, SpO2 94 %.   Exam: Gen Exam:Alert awake-not in any distress-chronically frail/sick appearing. HEENT:atraumatic, normocephalic Chest: B/L clear to auscultation anteriorly CVS:S1S2 regular Abdomen:soft non tender, non distended Extremities: Significant decrease in left arm edema.  Significant less tenderness in bilateral knees this morning. Neurology: Moving all 4 extremities with still with some mild LUE-mostly grip weakness that has been ongoing for the past several months per son. Skin: no rash   Pertinent Labs/Radiology:    Latest Ref Rng & Units 01/27/2022    7:44 AM 01/26/2022    4:53 AM 01/25/2022    6:02 AM  CBC  WBC 4.0 - 10.5 K/uL 17.3  19.4  17.0   Hemoglobin 13.0 - 17.0 g/dL 7.9  9.1  8.3   Hematocrit 39.0 - 52.0 % 25.1  27.5  25.2   Platelets 150 - 400 K/uL 309  278  254     Lab Results  Component Value Date   NA 131 (L) 01/27/2022   K 4.1 01/27/2022   CL 93 (L) 01/27/2022   CO2 23 01/27/2022     Assessment/Plan: SIRS due to acute gouty arthritis involving bilateral knees, numerous joints of left hand: Bilateral knee/left hand arthralgias better-patient underwent intra-articular steroid injection to  bilateral knee on 8/24.  He did not have a significant response to IV Solu-Medrol/prednisone.  Suspect that patient not only has acute gouty arthritis but significant amount of  OA at baseline as well.  No evidence of infection-blood/sign of blood cultures negative.  Septic arthritis/sepsis ruled out.  Evaluated by infectious disease during the earlier part of this hospitalization-with recommendations to continue to monitor off antibiotics.  Orthopedics (Dr. Alvan Dame) continues to follow periodically  CKD stage V with progression to ESRD: Encephalopathy better-nephrology following and directing HD care.  Hyperkalemia: Due to worsening renal function-resolved with Lokelma and HD.  Continue to monitor periodically.  Oozing from tunneled HD catheter site: Evaluated by IR on 8/18-bleeding site sutured-no further bleeding evident.  A-fib with RVR: Triggered by uremia/acute illness-now maintaining sinus rhythm on oral beta-blocker-Eliquis on hold due to ongoing lower GI bleeding.  Cardiology has signed off.  Lower GI bleeding: Has had intermittent hematochezia for the past few days-last episode on 8/25.  Did require 2 units of PRBC transfusion on 8/20.  GI consulted but given lingering encephalopathy-severe debility-felt to be too weak to tolerate a colonoscopy prep-with recommendations to reconsult GI when encephalopathy/debility have improved.  GI recommended to hold Eliquis 5 days from 8/22-but since patient had another episode of hematochezia on 8/25-we will plan to hold Eliquis for a few more days.  Thankfully his encephalopathy is better-he has started to participate a bit more with therapy-suspect if this trend continues-we will need to reconsult GI sometime early next week.  In the meantime-we will plan to continue to follow CBC and transfuse accordingly.  Normocytic anemia: Multifactorial etiology-due to acute illness/CKD/acute blood loss anemia due to lower GI bleeding.  Transfused 2 units of PRBC on 8/20-slight drop in hemoglobin today-in the absence of active GI bleeding-this is likely due to equilibration.  Repeat CBC tomorrow morning or sooner if patient has another episode  of lower GI bleeding.  We will plan on transfusion if significant drop in hemoglobin or if recurrent hematochezia recurs.    Acute metabolic encephalopathy: Due to AKI/uremia-improving.  Much more awake and alert today.    Transaminitis: Unclear etiology-suspect probably medication related-possibly related to cefepime.  Statin/Zytiga was held-LFTs have improved.  RUQ ultrasound was unremarkable.    Hyponatremia: Related to volume-mild-should improve with further HD episodes.  Hypoglycemia: Likely related to use of oral hypoglycemic agents in the setting of worsening renal function.  Continue to watch closely.  DM-2 (A1c 9.6 on 8/14) with uncontrolled hyperglycemia due to steroids: CBGs fluctuating quite a bit-now mostly on the higher side-increase Levemir to 26 units twice daily, increase Premeal NovoLog to 8 units.  Follow/optimize.   Will need to permanently stop glipizide and metformin on discharge.  Recent Labs    01/26/22 1618 01/26/22 2050 01/27/22 0707  GLUCAP 422* 323* 278*     Chronic left arm weakness: Per patient's son-this has been ongoing for the past several months-son has noted a problem with the patient's grip mostly.  Due to left hand arthralgia/L UE swelling (after superficialization of AV fistula)-this has worsened somewhat.  However over the past few days-with decreasing swelling/arthralgias-he seems to move his left arm more fluently.  MRI brain negative for CVA..  Once patient's debility improves more-we may need to pursue a C-spine MRI at some point.  Superficialization of left upper extremity AV fistula by vascular surgery on 8/8: With some residual arm swelling that seems to have that had worsened but has since improved over the past few days  with elevation.  Appreciate vascular surgery input.  Continue to follow closely.    Prostate cancer: On prednisone/Zytiga chronically.  Zytiga briefly held due to elevated LFTs-has been tapered back to his usual prednisone  dosing  Morbid Obesity: Estimated body mass index is 36.56 kg/m as calculated from the following:   Height as of this encounter: '5\' 11"'$  (1.803 m).   Weight as of this encounter: 118.9 kg.   Code status:   Code Status: Full Code   DVT Prophylaxis:Hold Eliquis due to ongoing hematochezia.   Family Communication: Son at bedside on 8/24   Disposition Plan: Status is: Inpatient Remains inpatient appropriate because: Likely progression to ESRD-nephrology initiating dialysis-remains encephalopathic-continues to have intermittent hematochezia-needs colonoscopy-not yet stable for discharge.   Planned Discharge Destination:SNF  Diet: Diet Order             Diet renal/carb modified with fluid restriction Diet-HS Snack? Nothing; Fluid restriction: 1200 mL Fluid; Room service appropriate? Yes; Fluid consistency: Thin  Diet effective now                     Antimicrobial agents: Anti-infectives (From admission, onward)    Start     Dose/Rate Route Frequency Ordered Stop   01/24/22 1535  ceFAZolin (ANCEF) IVPB 2g/100 mL premix        over 30 Minutes Intravenous Continuous PRN 01/24/22 1535 01/24/22 1535   01/24/22 1445  ceFAZolin (ANCEF) IVPB 2g/100 mL premix  Status:  Discontinued        2 g 200 mL/hr over 30 Minutes Intravenous To Radiology 01/24/22 1359 01/25/22 1341   01/16/22 1000  ceFEPIme (MAXIPIME) 2 g in sodium chloride 0.9 % 100 mL IVPB  Status:  Discontinued        2 g 200 mL/hr over 30 Minutes Intravenous Every 24 hours 01/15/22 1014 01/17/22 1355   01/15/22 1015  ceFEPIme (MAXIPIME) 2 g in sodium chloride 0.9 % 100 mL IVPB        2 g 200 mL/hr over 30 Minutes Intravenous  Once 01/15/22 1005 01/15/22 1127   01/15/22 1015  metroNIDAZOLE (FLAGYL) IVPB 500 mg        500 mg 100 mL/hr over 60 Minutes Intravenous  Once 01/15/22 1005 01/15/22 1213   01/15/22 1015  vancomycin (VANCOCIN) IVPB 1000 mg/200 mL premix  Status:  Discontinued        1,000 mg 200 mL/hr over 60  Minutes Intravenous  Once 01/15/22 1005 01/15/22 1013   01/15/22 1015  vancomycin (VANCOREADY) IVPB 2000 mg/400 mL        2,000 mg 200 mL/hr over 120 Minutes Intravenous  Once 01/15/22 1013 01/15/22 1325   01/15/22 1011  vancomycin variable dose per unstable renal function (pharmacist dosing)  Status:  Discontinued         Does not apply See admin instructions 01/15/22 1011 01/18/22 1526        MEDICATIONS: Scheduled Meds:  abiraterone acetate  1,000 mg Oral Daily   calcitRIOL  0.25 mcg Oral Daily   carvedilol  25 mg Oral BID WC   Chlorhexidine Gluconate Cloth  6 each Topical Q0600   feeding supplement (NEPRO CARB STEADY)  237 mL Oral BID BM   insulin aspart  0-9 Units Subcutaneous TID WC   insulin aspart  8 Units Subcutaneous TID WC   insulin detemir  26 Units Subcutaneous BID   multivitamin  1 tablet Oral QHS   pantoprazole  40 mg Oral BID   predniSONE  5 mg Oral Q breakfast   sodium chloride flush  10-40 mL Intracatheter Q12H   Continuous Infusions:   PRN Meds:.acetaminophen, hydrALAZINE, HYDROmorphone (DILAUDID) injection, lidocaine (PF), lidocaine-prilocaine, metoprolol tartrate, ondansetron (ZOFRAN) IV, pentafluoroprop-tetrafluoroeth, sodium chloride   I have personally reviewed following labs and imaging studies  LABORATORY DATA: CBC: Recent Labs  Lab 01/23/22 1834 01/24/22 0415 01/25/22 0602 01/26/22 0453 01/27/22 0744  WBC 18.5* 17.5* 17.0* 19.4* 17.3*  HGB 8.6* 8.5* 8.3* 9.1* 7.9*  HCT 26.1* 26.5* 25.2* 27.5* 25.1*  MCV 86.4 86.9 86.3 86.2 87.8  PLT 217 243 254 278 309     Basic Metabolic Panel: Recent Labs  Lab 01/22/22 0405 01/23/22 0450 01/24/22 0841 01/25/22 0602 01/26/22 0453 01/27/22 0745  NA 135 133* 135 133* 135 131*  K 4.0 4.2 3.7 3.8 4.4 4.1  CL 95* 92* 95* 94* 94* 93*  CO2 '22 22 30 23 23 23  '$ GLUCOSE 181* 304* 72 49* 260* 278*  BUN 110* 140* 95* 116* 73* 116*  CREATININE 6.83* 7.77* 6.35* 7.60* 5.81* 7.80*  CALCIUM 8.1* 8.5* 8.2*  8.3* 8.5* 8.3*  MG  --   --   --  1.9  --   --   PHOS 7.7*  --  6.1* 8.0* 7.5* 8.7*     GFR: Estimated Creatinine Clearance: 11.9 mL/min (A) (by C-G formula based on SCr of 7.8 mg/dL (H)).  Liver Function Tests: Recent Labs  Lab 01/21/22 0258 01/22/22 0405 01/23/22 0450 01/24/22 0841 01/25/22 0602 01/26/22 0453 01/27/22 0745  AST 75*  --  35  --   --   --   --   ALT 72*  --  48*  --   --   --   --   ALKPHOS 83  --  81  --   --   --   --   BILITOT 1.0  --  0.6  --   --   --   --   PROT 5.5*  --  5.9*  --   --   --   --   ALBUMIN 1.6*   < > 1.7* 1.6* 1.6* 1.7* 1.6*   < > = values in this interval not displayed.    No results for input(s): "LIPASE", "AMYLASE" in the last 168 hours. No results for input(s): "AMMONIA" in the last 168 hours.   Coagulation Profile: No results for input(s): "INR", "PROTIME" in the last 168 hours.   Cardiac Enzymes: No results for input(s): "CKTOTAL", "CKMB", "CKMBINDEX", "TROPONINI" in the last 168 hours.  BNP (last 3 results) No results for input(s): "PROBNP" in the last 8760 hours.  Lipid Profile: No results for input(s): "CHOL", "HDL", "LDLCALC", "TRIG", "CHOLHDL", "LDLDIRECT" in the last 72 hours.  Thyroid Function Tests: No results for input(s): "TSH", "T4TOTAL", "FREET4", "T3FREE", "THYROIDAB" in the last 72 hours.   Anemia Panel: No results for input(s): "VITAMINB12", "FOLATE", "FERRITIN", "TIBC", "IRON", "RETICCTPCT" in the last 72 hours.   Urine analysis:    Component Value Date/Time   COLORURINE YELLOW 05/04/2008 2129   APPEARANCEUR Cloudy (A) 08/19/2015 1403   LABSPEC 1.012 05/04/2008 2129   PHURINE 5.5 05/04/2008 2129   GLUCOSEU Negative 08/19/2015 1403   GLUCOSEU 500 (A) 05/04/2008 2129   HGBUR SMALL (A) 04/22/2008 0848   BILIRUBINUR Negative 08/19/2015 1403   KETONESUR NEG mg/dL 05/04/2008 2129   PROTEINUR 1+ (A) 08/19/2015 1403   PROTEINUR 30 (A) 05/04/2008 2129   UROBILINOGEN 0.2 05/04/2008 2129   NITRITE  Negative 08/19/2015 1403  NITRITE NEG 05/04/2008 2129   LEUKOCYTESUR 1+ (A) 08/19/2015 1403    Sepsis Labs: Lactic Acid, Venous    Component Value Date/Time   LATICACIDVEN 2.6 (HH) 01/15/2022 1026    MICROBIOLOGY: No results found for this or any previous visit (from the past 240 hour(s)).   RADIOLOGY STUDIES/RESULTS: No results found.   LOS: 12 days   Oren Binet, MD  [facility]    To contact the attending provider between 7A-7P or the covering provider during after hours 7P-7A, please log into the web site [email or web address] and access using universal  password for that web site. If you do not have the password, please call the hospital operator.  01/27/2022, 11:32 AM

## 2022-01-28 ENCOUNTER — Inpatient Hospital Stay (HOSPITAL_COMMUNITY): Payer: Medicare Other

## 2022-01-28 DIAGNOSIS — D62 Acute posthemorrhagic anemia: Secondary | ICD-10-CM | POA: Diagnosis not present

## 2022-01-28 DIAGNOSIS — R29898 Other symptoms and signs involving the musculoskeletal system: Secondary | ICD-10-CM

## 2022-01-28 DIAGNOSIS — A419 Sepsis, unspecified organism: Secondary | ICD-10-CM | POA: Diagnosis not present

## 2022-01-28 DIAGNOSIS — I10 Essential (primary) hypertension: Secondary | ICD-10-CM

## 2022-01-28 DIAGNOSIS — N185 Chronic kidney disease, stage 5: Secondary | ICD-10-CM | POA: Diagnosis not present

## 2022-01-28 LAB — CBC
HCT: 26.5 % — ABNORMAL LOW (ref 39.0–52.0)
Hemoglobin: 8.5 g/dL — ABNORMAL LOW (ref 13.0–17.0)
MCH: 28.2 pg (ref 26.0–34.0)
MCHC: 32.1 g/dL (ref 30.0–36.0)
MCV: 88 fL (ref 80.0–100.0)
Platelets: 333 10*3/uL (ref 150–400)
RBC: 3.01 MIL/uL — ABNORMAL LOW (ref 4.22–5.81)
RDW: 14.9 % (ref 11.5–15.5)
WBC: 17 10*3/uL — ABNORMAL HIGH (ref 4.0–10.5)
nRBC: 0 % (ref 0.0–0.2)

## 2022-01-28 LAB — GLUCOSE, CAPILLARY
Glucose-Capillary: 133 mg/dL — ABNORMAL HIGH (ref 70–99)
Glucose-Capillary: 188 mg/dL — ABNORMAL HIGH (ref 70–99)
Glucose-Capillary: 300 mg/dL — ABNORMAL HIGH (ref 70–99)
Glucose-Capillary: 232 mg/dL — ABNORMAL HIGH (ref 70–99)

## 2022-01-28 LAB — RENAL FUNCTION PANEL
Albumin: 1.7 g/dL — ABNORMAL LOW (ref 3.5–5.0)
Anion gap: 15 (ref 5–15)
BUN: 69 mg/dL — ABNORMAL HIGH (ref 8–23)
CO2: 25 mmol/L (ref 22–32)
Calcium: 8.2 mg/dL — ABNORMAL LOW (ref 8.9–10.3)
Chloride: 94 mmol/L — ABNORMAL LOW (ref 98–111)
Creatinine, Ser: 5.61 mg/dL — ABNORMAL HIGH (ref 0.61–1.24)
GFR, Estimated: 10 mL/min — ABNORMAL LOW (ref >60)
Glucose, Bld: 179 mg/dL — ABNORMAL HIGH (ref 70–99)
Phosphorus: 6.4 mg/dL — ABNORMAL HIGH (ref 2.5–4.6)
Potassium: 3.7 mmol/L (ref 3.5–5.1)
Sodium: 134 mmol/L — ABNORMAL LOW (ref 135–145)

## 2022-01-28 MED ORDER — LORAZEPAM 2 MG/ML IJ SOLN: 1.0000 mg | Freq: Once | INTRAMUSCULAR | Status: DC | PRN

## 2022-01-28 NOTE — Progress Notes (Signed)
PROGRESS NOTE        PATIENT DETAILS Name: [name] Age: 66 y.o. Sex: male Date of Birth: [birth year] Admit Date: 01/15/2022 Admitting Physician Lequita Halt, MD POE:UMPNT, Malva Limes., FNP  Brief Summary: Patient is a 65 y.o.  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  Started on IV steroids for gouty arthritis-evaluated by nephrology and started on HD.  Hospital course complicated by A-fib RVR, lower GI bleeding and slow clinical improvement.  See below for further details.    Significant events: 8/14>> admit for confusion/gouty arthritis/progression to ESRD. 8/16>> worsening transaminases/encephalopathy/renal function-renal planning HD. 8/17>> TDC placed-first HD planned. 8/18>> Afib RVR with first HD session-HD cut short. Spontaneously converted to sinus rhythm.Oozing from nontunneled HD cath site.  8/19>> Brief run of RVR.  Tolerated HD without any incident. 8/20>> Worsening encephalopathy-urgent HD, 2 episodes of painless small-volume hematochezia. Hemoglobin dropped to 6.7-given 2 units of PRBC with HD.  8/21>> less encephalopathic-answering simple questions-another episode (third) of hematochezia earlier this morning. 8/24>> intra-articular steroid injections to bilateral knee 8/25>> small-volume hematochezia reoccurred  Significant studies: 8/15>> MRI brain: No acute CVA. 8/15>> renal ultrasound: No hydronephrosis. 8/14>> left knee synovial fluid: WBC 23,835, extracellular sodium urate crystals. 8/15>> right knee synovial fluid: WBC 15,600, intracellular monosodium urate crystals. 8/16>> Echo: EF 55-60% 8/16>> RUQ ultrasound: No major abnormalities noted.  Significant microbiology data: 8/14>> blood culture: No growth 8/14>> left  knee synovial fluid: No growth 8/15>> right knee synovial fluid: No growth  Procedures: 8/14>> left knee arthrocentesis 8/15>> right knee arthrocentesis 8/17>> tunneled HD catheter placement by IR. 8/24>> intra-articular steroid injection to bilateral knee by orthopedics.  Consults: Nephrology. Orthopedics Cardiology Vascular ID GI  Subjective: Patient in bed, appears comfortable, denies any headache, no fever, no chest pain or pressure, no shortness of breath , no abdominal pain. No focal weakness.   Objective: Vitals: Blood pressure (!) 152/90, pulse 74, temperature 98.1 F (36.7 C), temperature source Oral, resp. rate (!) 21, height '5\' 11"'$  (1.803 m), weight 116 kg, SpO2 94 %.   Exam:  Awake Alert, No new F.N deficits, chronic left upper extremity grip weakness for several months [place].AT,PERRAL Supple Neck, No JVD,   Symmetrical Chest wall movement, Good air movement bilaterally, CTAB RRR,No Gallops, Rubs or new Murmurs,  +ve B.Sounds, Abd Soft, No tenderness,   No Cyanosis, Clubbing or edema   Assessment/Plan:  SIRS due to acute gouty arthritis involving bilateral knees, numerous joints of left hand: Bilateral knee/left hand arthralgias better-patient underwent intra-articular steroid injection to bilateral knee on 8/24.  He did not have a significant response to IV Solu-Medrol/prednisone.  Suspect that patient not only has acute gouty arthritis but significant amount of OA at baseline as well.  No evidence of infection-blood/sign of blood cultures negative.  Septic arthritis/sepsis ruled out.  Evaluated by infectious disease during the earlier part of this hospitalization-with recommendations to continue to monitor off antibiotics.  Orthopedics (Dr. Alvan Dame) continues to follow periodically  CKD stage V with progression to ESRD: Encephalopathy better-nephrology following and directing HD care.  Hyperkalemia: Due to worsening renal function-resolved with Lokelma and HD.   Continue to monitor periodically.  Oozing from  tunneled HD catheter site: Evaluated by IR on 8/18-bleeding site sutured-no further bleeding evident.  A-fib with RVR: Triggered by uremia/acute illness-now maintaining sinus rhythm on oral beta-blocker-Eliquis on hold due to ongoing lower GI bleeding.  Cardiology has signed off.  Lower GI bleeding: Has had intermittent hematochezia for the past few days-last episode on 8/25.  Did require 2 units of PRBC transfusion on 8/20.  GI consulted but given lingering encephalopathy-severe debility-felt to be too weak to tolerate a colonoscopy prep-with recommendations to reconsult GI when encephalopathy/debility have improved.  GI recommended to hold Eliquis 5 days from 8/22-but since patient had another episode of hematochezia on 8/25-we will plan to hold Eliquis for a few more days.  Thankfully his encephalopathy is better-he has started to participate a bit more with therapy-suspect if this trend continues-we will need to reconsult GI sometime early next week.  In the meantime-we will plan to continue to follow CBC and transfuse accordingly.  Normocytic anemia: Multifactorial etiology-due to acute illness/CKD/acute blood loss anemia due to lower GI bleeding.  Transfused 2 units of PRBC on 8/20-slight drop in hemoglobin today-in the absence of active GI bleeding-this is likely due to equilibration.  Repeat CBC tomorrow morning or sooner if patient has another episode of lower GI bleeding.  We will plan on transfusion if significant drop in hemoglobin or if recurrent hematochezia recurs.    Acute metabolic encephalopathy: Due to AKI/uremia-improving.  Much more awake and alert today.    Transaminitis: Unclear etiology-suspect probably medication related-possibly related to cefepime.  Statin/Zytiga was held-LFTs have improved.  RUQ ultrasound was unremarkable.    Hyponatremia: Related to volume-mild-should improve with further HD episodes.  Hypoglycemia: Likely  related to use of oral hypoglycemic agents in the setting of worsening renal function.  Continue to watch closely.  DM-2 (A1c 9.6 on 8/14) with uncontrolled hyperglycemia due to steroids: CBGs fluctuating quite a bit-now mostly on the higher side-increase Levemir to 26 units twice daily, increase Premeal NovoLog to 8 units.  Follow/optimize.   Will need to permanently stop glipizide and metformin on discharge.  Recent Labs    01/27/22 1308 01/27/22 1637 01/27/22 2051  GLUCAP 137* 282* 189*    Chronic left arm weakness: Per patient's son-this has been ongoing for the past several months-son has noted a problem with the patient's grip mostly.  Due to left hand arthralgia/L UE swelling (after superficialization of AV fistula)-this has worsened somewhat.  However over the past few days-with decreasing swelling/arthralgias-he seems to move his left arm more fluently.  MRI brain negative for CVA.  Will obtain MRI of C-spine.  Superficialization of left upper extremity AV fistula by vascular surgery on 8/8: With some residual arm swelling that seems to have that had worsened but has since improved over the past few days with elevation.  Appreciate vascular surgery input.  Continue to follow closely.    Prostate cancer: On prednisone/Zytiga chronically.  Zytiga briefly held due to elevated LFTs-has been tapered back to his usual prednisone dosing  Morbid Obesity: Estimated body mass index is 35.67 kg/m as calculated from the following:   Height as of this encounter: '5\' 11"'$  (1.803 m).   Weight as of this encounter: 116 kg.   Code status:   Code Status: Full Code   DVT Prophylaxis:Hold Eliquis due to ongoing hematochezia.   Family Communication: Son at bedside on 8/24   Disposition Plan: Status is: Inpatient Remains inpatient appropriate because: Likely progression to ESRD-nephrology initiating dialysis-remains encephalopathic-continues to have intermittent hematochezia-needs  colonoscopy-not  yet stable for discharge.   Planned Discharge Destination:SNF  Diet: Diet Order             Diet renal/carb modified with fluid restriction Diet-HS Snack? Nothing; Fluid restriction: 1200 mL Fluid; Room service appropriate? Yes; Fluid consistency: Thin  Diet effective now                   MEDICATIONS: Scheduled Meds:  abiraterone acetate  1,000 mg Oral Daily   calcitRIOL  0.25 mcg Oral Daily   carvedilol  25 mg Oral BID WC   Chlorhexidine Gluconate Cloth  6 each Topical Q0600   feeding supplement (NEPRO CARB STEADY)  237 mL Oral BID BM   insulin aspart  0-9 Units Subcutaneous TID WC   insulin aspart  8 Units Subcutaneous TID WC   insulin detemir  26 Units Subcutaneous BID   multivitamin  1 tablet Oral QHS   pantoprazole  40 mg Oral BID   predniSONE  5 mg Oral Q breakfast   sodium chloride flush  10-40 mL Intracatheter Q12H   Continuous Infusions: PRN Meds:.acetaminophen, hydrALAZINE, HYDROmorphone (DILAUDID) injection, lidocaine (PF), lidocaine-prilocaine, metoprolol tartrate, ondansetron (ZOFRAN) IV, pentafluoroprop-tetrafluoroeth, sodium chloride   I have personally reviewed following labs and imaging studies  LABORATORY DATA:  Recent Labs  Lab 01/24/22 0415 01/25/22 0602 01/26/22 0453 01/27/22 0744 01/28/22 0258  WBC 17.5* 17.0* 19.4* 17.3* 17.0*  HGB 8.5* 8.3* 9.1* 7.9* 8.5*  HCT 26.5* 25.2* 27.5* 25.1* 26.5*  PLT 243 254 278 309 333  MCV 86.9 86.3 86.2 87.8 88.0  MCH 27.9 28.4 28.5 27.6 28.2  MCHC 32.1 32.9 33.1 31.5 32.1  RDW 15.8* 15.4 15.2 15.1 14.9    Recent Labs  Lab 01/23/22 0450 01/24/22 0841 01/25/22 0602 01/26/22 0453 01/27/22 0745 01/28/22 0258  NA 133* 135 133* 135 131* 134*  K 4.2 3.7 3.8 4.4 4.1 3.7  CL 92* 95* 94* 94* 93* 94*  CO2 '22 30 23 23 23 25  '$ GLUCOSE 304* 72 49* 260* 278* 179*  BUN 140* 95* 116* 73* 116* 69*  CREATININE 7.77* 6.35* 7.60* 5.81* 7.80* 5.61*  CALCIUM 8.5* 8.2* 8.3* 8.5* 8.3* 8.2*  AST 35  --   --   --    --   --   ALT 48*  --   --   --   --   --   ALKPHOS 81  --   --   --   --   --   BILITOT 0.6  --   --   --   --   --   ALBUMIN 1.7* 1.6* 1.6* 1.7* 1.6* 1.7*  MG  --   --  1.9  --   --   --   PHOS  --  6.1* 8.0* 7.5* 8.7* 6.4*   RADIOLOGY STUDIES/RESULTS: No results found.   LOS: 13 days   Signature  Lala Lund M.D on 01/28/2022 at 11:09 AM   -  To page go to [email or web address]

## 2022-01-28 NOTE — Progress Notes (Signed)
[facility] Progress Note   Assessment/ Plan:    AoCKD5, now ESRD  s/p L BC AVF 01/09/22 superficialization, not ready for use S/p [facility] 01/24/22 with IR CLIP complete [name] MWF 2nd shift Started HD 8/17 Next HD 8/28, now on MWF schedule: 3K 4h 400 BFR, 3L UF, no heparin   2.  L knee arthritis, probably gout  - per primary  - ID following   3.  Hyperkalemia             - off Lokelma, K stable; 2-3K bath   4.  Hyponatremia:             - resolved   5.  Elevated LFTs:             - improved  6.  Encephalopathy:  - combination likely of uremia/ sepsis  - Improving  7.  Afib with RVR:  -s/p dilt gtt--> coreg  - Eliquis on hold  8. Bloody stools / hematochezia; GI FOllowing; EGD/CSY when more stable; per TRH; Hb stable   9.  Dispo: SNF when DC; CLIP is finalized  10. Leukocytosis, stable  Subjective:     HD yesterday 3L UF, tolerated well No interval event Hb up to 8.5   Objective:   BP (!) 152/90 (BP Location: Right Leg)   Pulse 80   Temp 98.1 F (36.7 C) (Oral)   Resp 20   Ht '5\' 11"'$  (1.803 m)   Wt 116 kg   SpO2 95%   BMI 35.67 kg/m   Intake/Output Summary (Last 24 hours) at 01/28/2022 0939 Last data filed at 01/28/2022 0900 Gross per 24 hour  Intake 837 ml  Output 3000 ml  Net -2163 ml    Weight change:   Physical Exam: GEN NAD, lying in bed, appears uncomfortable, yelling out HEENT EOMI PERRL NECK NO JVD PULM  clear CV RRR  ABD + pannus EXT no LE edema NEURO able to respond but doesn't always make sense MSK L knee tenderness ACCESS L AVF + T/B, healing well, R IJ nontunneled HD catheter with no blood  Imaging: No results found.  Labs: BMET Recent Labs  Lab 01/22/22 0405 01/23/22 0450 01/24/22 0841 01/25/22 0602 01/26/22 0453 01/27/22 0745 01/28/22 0258  NA 135 133* 135 133* 135 131* 134*  K 4.0 4.2 3.7 3.8 4.4 4.1 3.7  CL 95* 92* 95* 94* 94* 93* 94*  CO2 '22 22 30 23 23 23 25  '$ GLUCOSE 181* 304* 72 49* 260* 278*  179*  BUN 110* 140* 95* 116* 73* 116* 69*  CREATININE 6.83* 7.77* 6.35* 7.60* 5.81* 7.80* 5.61*  CALCIUM 8.1* 8.5* 8.2* 8.3* 8.5* 8.3* 8.2*  PHOS 7.7*  --  6.1* 8.0* 7.5* 8.7* 6.4*    CBC Recent Labs  Lab 01/25/22 0602 01/26/22 0453 01/27/22 0744 01/28/22 0258  WBC 17.0* 19.4* 17.3* 17.0*  HGB 8.3* 9.1* 7.9* 8.5*  HCT 25.2* 27.5* 25.1* 26.5*  MCV 86.3 86.2 87.8 88.0  PLT 254 278 309 333     Medications:     abiraterone acetate  1,000 mg Oral Daily   calcitRIOL  0.25 mcg Oral Daily   carvedilol  25 mg Oral BID WC   Chlorhexidine Gluconate Cloth  6 each Topical Q0600   feeding supplement (NEPRO CARB STEADY)  237 mL Oral BID BM   insulin aspart  0-9 Units Subcutaneous TID WC   insulin aspart  8 Units Subcutaneous TID WC   insulin detemir  26  Units Subcutaneous BID   multivitamin  1 tablet Oral QHS   pantoprazole  40 mg Oral BID   predniSONE  5 mg Oral Q breakfast   sodium chloride flush  10-40 mL Intracatheter Q12H    Rexene Agent, MD  01/28/2022, 9:39 AM

## 2022-01-29 DIAGNOSIS — I10 Essential (primary) hypertension: Secondary | ICD-10-CM | POA: Diagnosis not present

## 2022-01-29 DIAGNOSIS — D62 Acute posthemorrhagic anemia: Secondary | ICD-10-CM | POA: Diagnosis not present

## 2022-01-29 DIAGNOSIS — A419 Sepsis, unspecified organism: Secondary | ICD-10-CM | POA: Diagnosis not present

## 2022-01-29 DIAGNOSIS — N185 Chronic kidney disease, stage 5: Secondary | ICD-10-CM | POA: Diagnosis not present

## 2022-01-29 LAB — CBC
HCT: 26.4 % — ABNORMAL LOW (ref 39.0–52.0)
Hemoglobin: 8.4 g/dL — ABNORMAL LOW (ref 13.0–17.0)
MCH: 28.3 pg (ref 26.0–34.0)
MCHC: 31.8 g/dL (ref 30.0–36.0)
MCV: 88.9 fL (ref 80.0–100.0)
Platelets: 335 10*3/uL (ref 150–400)
RBC: 2.97 MIL/uL — ABNORMAL LOW (ref 4.22–5.81)
RDW: 14.9 % (ref 11.5–15.5)
WBC: 16 10*3/uL — ABNORMAL HIGH (ref 4.0–10.5)
nRBC: 0 % (ref 0.0–0.2)

## 2022-01-29 LAB — RENAL FUNCTION PANEL
Albumin: 1.8 g/dL — ABNORMAL LOW (ref 3.5–5.0)
Anion gap: 13 (ref 5–15)
BUN: 112 mg/dL — ABNORMAL HIGH (ref 8–23)
CO2: 25 mmol/L (ref 22–32)
Calcium: 8.1 mg/dL — ABNORMAL LOW (ref 8.9–10.3)
Chloride: 96 mmol/L — ABNORMAL LOW (ref 98–111)
Creatinine, Ser: 7.28 mg/dL — ABNORMAL HIGH (ref 0.61–1.24)
GFR, Estimated: 8 mL/min — ABNORMAL LOW (ref >60)
Glucose, Bld: 103 mg/dL — ABNORMAL HIGH (ref 70–99)
Phosphorus: 8 mg/dL — ABNORMAL HIGH (ref 2.5–4.6)
Potassium: 3.8 mmol/L (ref 3.5–5.1)
Sodium: 134 mmol/L — ABNORMAL LOW (ref 135–145)

## 2022-01-29 LAB — GLUCOSE, CAPILLARY
Glucose-Capillary: 121 mg/dL — ABNORMAL HIGH (ref 70–99)
Glucose-Capillary: 90 mg/dL (ref 70–99)
Glucose-Capillary: 275 mg/dL — ABNORMAL HIGH (ref 70–99)
Glucose-Capillary: 250 mg/dL — ABNORMAL HIGH (ref 70–99)

## 2022-01-29 MED ORDER — HEPARIN SODIUM (PORCINE) 1000 UNIT/ML IJ SOLN
INTRAMUSCULAR | Status: AC
  Filled 2022-01-29: qty 4

## 2022-01-29 MED ORDER — INSULIN DETEMIR 100 UNIT/ML ~~LOC~~ SOLN
30.0000 [IU] | Freq: Two times a day (BID) | SUBCUTANEOUS | Status: DC
Start: 2022-01-29 — End: 2022-02-01
  Administered 2022-01-29 – 2022-02-01 (×5): 30 [IU] via SUBCUTANEOUS
  Filled 2022-01-29 (×8): qty 0.3

## 2022-01-29 MED ORDER — CALCIUM ACETATE (PHOS BINDER) 667 MG PO CAPS
1334.0000 mg | ORAL_CAPSULE | Freq: Three times a day (TID) | ORAL | Status: DC
  Administered 2022-01-29 – 2022-02-02 (×10): 1334 mg via ORAL
  Filled 2022-01-29 (×10): qty 2

## 2022-01-29 NOTE — Progress Notes (Signed)
PROGRESS NOTE        PATIENT DETAILS Name: [name] Age: 66 y.o. Sex: male Date of Birth: 1956/01/22 Admit Date: 01/15/2022 Admitting Physician Lequita Halt, MD EUM:PNTIR, Malva Limes., FNP  Brief Summary: Patient is a 67 y.o.  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  Started on IV steroids for gouty arthritis-evaluated by nephrology and started on HD.  Hospital course complicated by A-fib RVR, lower GI bleeding and slow clinical improvement.  See below for further details.    Significant events: 8/14>> admit for confusion/gouty arthritis/progression to ESRD. 8/16>> worsening transaminases/encephalopathy/renal function-renal planning HD. 8/17>> TDC placed-first HD planned. 8/18>> Afib RVR with first HD session-HD cut short. Spontaneously converted to sinus rhythm.Oozing from nontunneled HD cath site.  8/19>> Brief run of RVR.  Tolerated HD without any incident. 8/20>> Worsening encephalopathy-urgent HD, 2 episodes of painless small-volume hematochezia. Hemoglobin dropped to 6.7-given 2 units of PRBC with HD.  8/21>> less encephalopathic-answering simple questions-another episode (third) of hematochezia earlier this morning. 8/24>> intra-articular steroid injections to bilateral knee 8/25>> small-volume hematochezia reoccurred  Significant studies: 8/15>> MRI brain: No acute CVA. 8/15>> renal ultrasound: No hydronephrosis. 8/14>> left knee synovial fluid: WBC 23,835, extracellular sodium urate crystals. 8/15>> right knee synovial fluid: WBC 15,600, intracellular monosodium urate crystals. 8/16>> Echo: EF 55-60% 8/16>> RUQ ultrasound: No major abnormalities noted. 8/28 MRI C Spine - 1. Prominent central disc protrusion at C5-6 with severe  central canal stenosis. 2. Increased T2 signal in the left hemi cord at C5-6 compatible with chronic myelomalacia. 3. Mild foraminal narrowing bilaterally at C5-6. 4. Mild central and moderate foraminal stenosis bilaterally at C6-7. 5. Severe right foraminal stenosis at C3-4.    Significant microbiology data: 8/14>> blood culture: No growth 8/14>> left knee synovial fluid: No growth 8/15>> right knee synovial fluid: No growth  Procedures: 8/14>> left knee arthrocentesis 8/15>> right knee arthrocentesis 8/17>> tunneled HD catheter placement by IR. 8/24>> intra-articular steroid injection to bilateral knee by orthopedics.  Consults: Nephrology. Orthopedics Cardiology Vascular ID GI  Subjective:  Patient in bed, appears comfortable, denies any headache, no fever, no chest pain or pressure, no shortness of breath , no abdominal pain. No new focal weakness.    Objective: Vitals: Blood pressure (!) 169/85, pulse 62, temperature 98.7 F (37.1 C), temperature source Oral, resp. rate 14, height '5\' 11"'$  (1.803 m), weight 117.7 kg, SpO2 100 %.   Exam:  Awake Alert, No new F.N deficits, chronic left upper extremity grip weakness for several months [place].AT,PERRAL Supple Neck, No JVD,   Symmetrical Chest wall movement, Good air movement bilaterally, CTAB RRR,No Gallops, Rubs or new Murmurs,  +ve B.Sounds, Abd Soft, No tenderness,   No Cyanosis, Clubbing or edema    Assessment/Plan:  SIRS due to acute gouty arthritis involving bilateral knees, numerous joints of left hand: Bilateral knee/left hand arthralgias better-patient underwent intra-articular steroid injection to bilateral knee on 8/24.  He did not have a significant response to IV Solu-Medrol/prednisone.  Suspect that patient not only has acute gouty arthritis but significant amount of OA at baseline as well.  No evidence of infection-blood/sign of blood cultures negative.  Septic arthritis/sepsis ruled out.  Evaluated by infectious  disease during the earlier part of this hospitalization-with recommendations to continue to monitor off antibiotics.  Orthopedics (Dr. Alvan Dame) continues to follow periodically  CKD stage V with progression to ESRD: Encephalopathy better-nephrology following and directing HD care.  Hyperkalemia: Due to worsening renal function-resolved with Lokelma and HD.  Continue to monitor periodically.  Oozing from tunneled HD catheter site: Evaluated by IR on 8/18-bleeding site sutured-no further bleeding evident.  A-fib with RVR: Triggered by uremia/acute illness-now maintaining sinus rhythm on oral beta-blocker-Eliquis on hold due to ongoing lower GI bleeding.  Cardiology has signed off.  Lower GI bleeding: Has had intermittent hematochezia for the past few days-last episode on 8/25.  Did require 2 units of PRBC transfusion on 8/20.  GI consulted but given lingering encephalopathy-severe debility-felt to be too weak to tolerate a colonoscopy prep-with recommendations to reconsult GI when encephalopathy/debility have improved.  GI recommended to hold Eliquis 5 days from 8/22-but since patient had another episode of hematochezia on 8/25-we will plan to hold Eliquis for a few more days.  Thankfully his encephalopathy is better-he has started to participate a bit more with therapy-suspect if this trend continues-we will need to reconsult GI sometime early next week.  In the meantime-we will plan to continue to follow CBC and transfuse accordingly.  Normocytic anemia: Multifactorial etiology-due to acute illness/CKD/acute blood loss anemia due to lower GI bleeding.  Transfused 2 units of PRBC on 8/20-slight drop in hemoglobin today-in the absence of active GI bleeding-this is likely due to equilibration.  Repeat CBC tomorrow morning or sooner if patient has another episode of lower GI bleeding.  We will plan on transfusion if significant drop in hemoglobin or if recurrent hematochezia recurs.    Acute metabolic  encephalopathy: Due to AKI/uremia-improving.  Much more awake and alert today.    Transaminitis: Unclear etiology-suspect probably medication related-possibly related to cefepime.  Statin/Zytiga was held-LFTs have improved.  RUQ ultrasound was unremarkable.    Hyponatremia: Related to volume-mild-should improve with further HD episodes.  Hypoglycemia: Likely related to use of oral hypoglycemic agents in the setting of worsening renal function.  Continue to watch closely.  DM-2 (A1c 9.6 on 8/14) with uncontrolled hyperglycemia due to steroids: CBGs fluctuating quite a bit-now mostly on the higher side-increase Levemir to 26 units twice daily, increase Premeal NovoLog to 8 units.  Follow/optimize.   Will need to permanently stop glipizide and metformin on discharge.  Recent Labs    01/28/22 1711 01/28/22 2139 01/29/22 0650  GLUCAP 300* 232* 121*    Chronic left arm weakness: Per patient's son-this has been ongoing for the past several months-son has noted a problem with the patient's grip mostly.  Due to left hand arthralgia/L UE swelling (after superficialization of AV fistula)-this has worsened somewhat.  However over the past few days-with decreasing swelling/arthralgias-he seems to move his left arm more fluently.  MRI brain negative for CVA.  Noted MRI of C-spine, will DW NS.  Superficialization of left upper extremity AV fistula by vascular surgery on 8/8: With some residual arm swelling that seems to have that had worsened but has since improved over the past few days with elevation.  Appreciate vascular surgery input.  Continue to follow closely.    Prostate cancer: On prednisone/Zytiga chronically.  Zytiga briefly held due to elevated LFTs-has been tapered back to his usual prednisone dosing  Morbid Obesity: Estimated body mass index is 36.19 kg/m as calculated from the following:   Height as of this encounter:  $'5\' 11"'V$  (1.803 m).   Weight as of this encounter: 117.7 kg.   Code  status:   Code Status: Full Code   DVT Prophylaxis:Hold Eliquis due to ongoing hematochezia.   Family Communication: Son at bedside on 8/24   Disposition Plan: Status is: Inpatient Remains inpatient appropriate because: Likely progression to ESRD-nephrology initiating dialysis-remains encephalopathic-continues to have intermittent hematochezia-needs colonoscopy-not yet stable for discharge.   Planned Discharge Destination:SNF  Diet: Diet Order             Diet renal/carb modified with fluid restriction Diet-HS Snack? Nothing; Fluid restriction: 1200 mL Fluid; Room service appropriate? Yes; Fluid consistency: Thin  Diet effective now                   MEDICATIONS: Scheduled Meds:  abiraterone acetate  1,000 mg Oral Daily   calcitRIOL  0.25 mcg Oral Daily   calcium acetate  1,334 mg Oral TID WC   carvedilol  25 mg Oral BID WC   Chlorhexidine Gluconate Cloth  6 each Topical Q0600   feeding supplement (NEPRO CARB STEADY)  237 mL Oral BID BM   heparin sodium (porcine)       insulin aspart  0-9 Units Subcutaneous TID WC   insulin aspart  8 Units Subcutaneous TID WC   insulin detemir  30 Units Subcutaneous BID   multivitamin  1 tablet Oral QHS   pantoprazole  40 mg Oral BID   predniSONE  5 mg Oral Q breakfast   sodium chloride flush  10-40 mL Intracatheter Q12H   Continuous Infusions: PRN Meds:.acetaminophen, heparin sodium (porcine), hydrALAZINE, HYDROmorphone (DILAUDID) injection, lidocaine (PF), lidocaine-prilocaine, LORazepam, metoprolol tartrate, ondansetron (ZOFRAN) IV, pentafluoroprop-tetrafluoroeth, sodium chloride   I have personally reviewed following labs and imaging studies  LABORATORY DATA:  Recent Labs  Lab 01/25/22 0602 01/26/22 0453 01/27/22 0744 01/28/22 0258 01/29/22 0800  WBC 17.0* 19.4* 17.3* 17.0* 16.0*  HGB 8.3* 9.1* 7.9* 8.5* 8.4*  HCT 25.2* 27.5* 25.1* 26.5* 26.4*  PLT 254 278 309 333 335  MCV 86.3 86.2 87.8 88.0 88.9  MCH 28.4 28.5 27.6  28.2 28.3  MCHC 32.9 33.1 31.5 32.1 31.8  RDW 15.4 15.2 15.1 14.9 14.9    Recent Labs  Lab 01/23/22 0450 01/24/22 0841 01/25/22 0602 01/26/22 0453 01/27/22 0745 01/28/22 0258 01/29/22 0800  NA 133*   < > 133* 135 131* 134* 134*  K 4.2   < > 3.8 4.4 4.1 3.7 3.8  CL 92*   < > 94* 94* 93* 94* 96*  CO2 22   < > '23 23 23 25 25  '$ GLUCOSE 304*   < > 49* 260* 278* 179* 103*  BUN 140*   < > 116* 73* 116* 69* 112*  CREATININE 7.77*   < > 7.60* 5.81* 7.80* 5.61* 7.28*  CALCIUM 8.5*   < > 8.3* 8.5* 8.3* 8.2* 8.1*  AST 35  --   --   --   --   --   --   ALT 48*  --   --   --   --   --   --   ALKPHOS 81  --   --   --   --   --   --   BILITOT 0.6  --   --   --   --   --   --   ALBUMIN 1.7*   < > 1.6* 1.7* 1.6* 1.7* 1.8*  MG  --   --  1.9  --   --   --   --  PHOS  --    < > 8.0* 7.5* 8.7* 6.4* 8.0*   < > = values in this interval not displayed.   RADIOLOGY STUDIES/RESULTS: MR CERVICAL SPINE WO CONTRAST  Result Date: 01/28/2022 CLINICAL DATA:  Myelopathy, acute, cervical spine. Left upper extremity weakness. EXAM: MRI CERVICAL SPINE WITHOUT CONTRAST TECHNIQUE: Multiplanar, multisequence MR imaging of the cervical spine was performed. No intravenous contrast was administered. COMPARISON:  None Available. FINDINGS: Alignment: No significant listhesis is present. Straightening of the normal cervical lordosis is present. Vertebrae: Marrow signal and vertebral body heights are normal. Cord: Distortion of the ventral surface the cord is noted at C5 increased T2 signal on the left. Cord signal and morphology is otherwise within. Posterior Fossa, vertebral arteries, paraspinal tissues: Craniocervical junction is normal. Flow is present in the vertebral arteries bilaterally. A remote lacunar infarct present the central pons. Visualized intracranial contents are otherwise within normal limits. Disc levels: C2-3: Negative. C3-4: Rightward disc osteophyte complex partially effaces ventral CSF. Severe right  foraminal stenosis is present. Left foramen is patent. C4-5: Shallow broad-based disc osteophyte complex is present. No significant stenosis is present. C5-6: A prominent central disc protrusion is present. Osteophytic spurring is noted. Canal is narrowed to 5 mm midline. Increased T2 signal is present in the left hemi cord. Mild foraminal narrowing is present bilaterally. C6-7: A broad-based disc osteophyte complex effaces the ventral CSF. Moderate foraminal stenosis is present bilaterally. C7-T1: Negative. IMPRESSION: 1. Prominent central disc protrusion at C5-6 with severe central canal stenosis. 2. Increased T2 signal in the left hemi cord at C5-6 compatible with chronic myelomalacia. 3. Mild foraminal narrowing bilaterally at C5-6. 4. Mild central and moderate foraminal stenosis bilaterally at C6-7. 5. Severe right foraminal stenosis at C3-4. Electronically Signed   By: San Morelle M.D.   On: 01/28/2022 19:41     LOS: 14 days   Signature  Lala Lund M.D on 01/29/2022 at 12:06 PM   -  To page go to [email or web address]

## 2022-01-29 NOTE — Progress Notes (Signed)
Physical Therapy Treatment Patient Details Name: [name] MRN: [id number] DOB: 1955/12/09 Today's Date: 01/29/2022   History of Present Illness 66 yo male presents to ED on 8/14 with hypoglycemia, AMS, recent fall, POD6 L AV fistula placement. CTH negative, FOBT +. s/p R and L knee aspiration on 8/14 and 8/15, respectively. Workup for SIRS due to acute gouty arthritis bilat knees. Bilateral knee/left hand arthralgias better-patient underwent intra-articular steroid injection to bilateral knee on 8/24.  He did not have a significant response to IV Solu-Medrol/prednisone, septic arthritis ruled out. C-Spine MRI showing prominent central disc protrusion at C5-6 with severe central canal stenosis. Increased T2 signal in the left hemi cord at C5-6 compatible with chronic myelomalacia. Mild foraminal narrowing and central and moderate foraminal stenosis bilaterally at C5 and 6-7. Severe right foraminal stenosis at C3-4. PMH includes CKD stage V, DM-2 with retinopathy and neuropathy, chronic HFpEF, HTN, HLD, prostate cancer, cardiomyopathy.    PT Comments    Pt received in supine, agreeable to therapy session after premedication by RN with IV pain meds, pt limited due to severe LUE and B knee pain during bed mobility. Pt needing +2 maxA to totalA for supine to/from sitting posture and able to tolerate sitting EOB ~12 mins. BP readings inconsistent with pt symptoms from supine to seated posture and pt alert and cooperative with fair seated balance after initial severe pain subsided, able to perform some tasks while seated with RUE but BLE and LUE too pain limited to perform LE seated exercises. MD/RN notified of pt pain not responding well to premedication. Plan to progress seated/standing balance next session pending pain tolerance, may need to consider mechanical lift for OOB transfers if pain remains too severe. Pt continues to benefit from PT services to progress toward functional mobility goals.    Recommendations for follow up therapy are one component of a multi-disciplinary discharge planning process, led by the attending physician.  Recommendations may be updated based on patient status, additional functional criteria and insurance authorization.  Follow Up Recommendations  Skilled nursing-short term rehab (<3 hours/day) Can patient physically be transported by private vehicle: No   Assistance Recommended at Discharge Frequent or constant Supervision/Assistance  Patient can return home with the following Two people to help with walking and/or transfers;A lot of help with bathing/dressing/bathroom;Assistance with cooking/housework;Help with stairs or ramp for entrance;Assist for transportation   Equipment Recommendations  Other (comment) (TBD, currently would need mechanical lift and hospital bed)    Recommendations for Other Services       Precautions / Restrictions Precautions Precautions: Fall Precaution Comments: R IJ catheter, LUE edema/pain, severe B knee pain Restrictions Weight Bearing Restrictions: No     Mobility  Bed Mobility Overal bed mobility: Needs Assistance Bed Mobility: Rolling, Supine to Sit, Sit to Supine Rolling: Max assist   Supine to sit: Max assist, +2 for physical assistance, +2 for safety/equipment, HOB elevated Sit to supine: Max assist, +2 for physical assistance, +2 for safety/equipment, Total assist   General bed mobility comments: pt requires assist to manage trunk and to maneuver BLEs during bed mobility tasks, able to assist minimally with pulling trunk forward with transition to EOB using RUE (unable to assist functionally with LUE due to edema/pain); max to totalA for return to supine (via helicopter technique with therapist helping at trunk and BLE) secondary to pt increased fatigue and pain; partial roll to L side prior to sitting upright with maxA for cross-body reaching to rail    Transfers Overall  transfer level: Needs  assistance   Transfers: Bed to chair/wheelchair/BSC            Lateral/Scoot Transfers: Max assist, +2 physical assistance, Total assist General transfer comment: practiced lateral scoots on EOB with pt needing MAX A to totalA +2 to scoot hips towards [facility] with use of bed pad, encouraged pt to use RUE to assist but no significant          Balance Overall balance assessment: Needs assistance Sitting-balance support: Single extremity supported, Feet supported Sitting balance-Leahy Scale:  (Poor to Fair) Sitting balance - Comments: initially maxA trunk support due to posterior lean with c/o BLE pain, once bed height elevated ~3-4" pt able to tolerate B feet resting on floor with progression to close min guard/supervision for static sitting, ~12 mins total. Postural control: Posterior lean     Standing balance comment: NT, BLE pain too severe                            Cognition Arousal/Alertness: Awake/alert Behavior During Therapy: WFL for tasks assessed/performed Overall Cognitive Status: Impaired/Different from baseline Area of Impairment: Safety/judgement, Problem solving, Following commands                       Following Commands: Follows one step commands with increased time Safety/Judgement: Decreased awareness of deficits   Problem Solving: Slow processing, Requires verbal cues, Requires tactile cues, Decreased initiation General Comments: overall slow to process and slow to initiate tasks, perseverating on pain and anticipation of pain, cooperative with encouragement. Pt son present and able to assist with therapist questions at times.        Exercises General Exercises - Lower Extremity Long Arc Quad:  (attempted minimally during repositioning of BLE while seated EOB but pt unable to tolerate due to pain)    General Comments General comments (skin integrity, edema, etc.): BP taken supine/seated in RLE (no options to use other extremities due to  restrictions/IV and LLE pain) but possibly inaccurate readings, MAP (142) supine then reading (52) at edge of bed, pt asymptomatic and responding normally, denies lightheadedness, RN notified may need manual BP check next session for more accuracy. HR tachy to 120's bpm with bed mobility, improves to ~105 bpm with seated rest break after a few mins.      Pertinent Vitals/Pain Pain Assessment Pain Assessment: PAINAD Faces Pain Scale: Hurts whole lot Breathing: occasional labored breathing, short period of hyperventilation Negative Vocalization: repeated troubled calling out, loud moaning/groaning, crying Facial Expression: facial grimacing Body Language: tense, distressed pacing, fidgeting Consolability: distracted or reassured by voice/touch PAINAD Score: 7 Pain Location: B knees L>R/ L arm Pain Descriptors / Indicators: Sore, Discomfort, Grimacing, Guarding, Moaning Pain Intervention(s): Limited activity within patient's tolerance, Monitored during session, Repositioned, Premedicated before session, Other (comment), Ice applied (dilaudid ~45 mins prior to mobility (tried immediately after meds given but pt deferred at that time), pt yelling out loudly during transition to EOB but reports pain down to 7/10 after sitting EOB ~5 mins)           PT Goals (current goals can now be found in the care plan section) Acute Rehab PT Goals PT Goal Formulation: With patient/family Time For Goal Achievement: 01/31/22 Progress towards PT goals: Progressing toward goals (slow progress 2/2 pain)    Frequency    Min 2X/week      PT Plan Current plan remains appropriate    Co-evaluation  PT/OT/SLP Co-Evaluation/Treatment: Yes Reason for Co-Treatment: Complexity of the patient's impairments (multi-system involvement);Necessary to address cognition/behavior during functional activity;For patient/therapist safety;To address functional/ADL transfers PT goals addressed during session: Mobility/safety  with mobility;Balance        AM-PAC PT "6 Clicks" Mobility   Outcome Measure  Help needed turning from your back to your side while in a flat bed without using bedrails?: A Lot Help needed moving from lying on your back to sitting on the side of a flat bed without using bedrails?: Total Help needed moving to and from a bed to a chair (including a wheelchair)?: Total Help needed standing up from a chair using your arms (e.g., wheelchair or bedside chair)?: Total Help needed to walk in hospital room?: Total Help needed climbing 3-5 steps with a railing? : Total 6 Click Score: 7    End of Session Equipment Utilized During Treatment: Gait belt Activity Tolerance: Patient limited by pain;Patient limited by fatigue Patient left: in bed;with call bell/phone within reach;with bed alarm set;with family/visitor present;Other (comment) (bed in chair posture, pillow to elevate LUE) Nurse Communication: Mobility status;Patient requests pain meds;Other (comment) (RN/MD notified dilaudid isn't decreasing pain much for B knee and L elbow pain when premedicated for mobility) PT Visit Diagnosis: Other abnormalities of gait and mobility (R26.89)     Time: 1428-1510 PT Time Calculation (min) (ACUTE ONLY): 42 min  Charges:  $Therapeutic Activity: 23-37 mins                     Vollie Brunty P., PTA Acute Rehabilitation Services Secure Chat Preferred 9a-5:30pm Office: [place] 01/29/2022, 3:54 PM

## 2022-01-29 NOTE — Progress Notes (Signed)
Occupational Therapy Treatment Patient Details Name: [name] MRN: [id number] DOB: 28-Jun-1955 Today's Date: 01/29/2022   History of present illness 66 yo male presents to ED on 8/14 with hypoglycemia, AMS, recent fall, POD6 L AV fistula placement. CTH negative, FOBT +. s/p R and L knee aspiration on 8/14 and 8/15, respectively. Workup for SIRS due to acute gouty arthritis bilat knees. Bilateral knee/left hand arthralgias better-patient underwent intra-articular steroid injection to bilateral knee on 8/24.  He did not have a significant response to IV Solu-Medrol/prednisone, septic arthritis ruled out. C-Spine MRI showing prominent central disc protrusion at C5-6 with severe central canal stenosis. Increased T2 signal in the left hemi cord at C5-6 compatible with chronic myelomalacia. Mild foraminal narrowing and central and moderate foraminal stenosis bilaterally at C5 and 6-7. Severe right foraminal stenosis at C3-4. PMH includes CKD stage V, DM-2 with retinopathy and neuropathy, chronic HFpEF, HTN, HLD, prostate cancer, cardiomyopathy.   OT comments  Pt with gradual progress towards established OT goals. Seen in collaboration with PT to optimize mobility and therapeutic benefit as well as decrease pain. Pt pain limited this session with audible moaning with movement. Engaging in appropriate coping strategies including deep breathing, however, requiring cues to time and pace deep breathing for optimal benefit. Pt requiring max A for bed mobility and initially requiring max A for sitting balance but improving to min guard/supervision with improved pain tolerance with bed elevated to decrease pressure on feet. Performing grooming with supervision and LUE therapy ball squeezes. Continue to recommend SNF to optimize safety and independence in ADL.    Recommendations for follow up therapy are one component of a multi-disciplinary discharge planning process, led by the attending physician.  Recommendations  may be updated based on patient status, additional functional criteria and insurance authorization.    Follow Up Recommendations  Skilled nursing-short term rehab (<3 hours/day)    Assistance Recommended at Discharge Frequent or constant Supervision/Assistance  Patient can return home with the following  Two people to help with bathing/dressing/bathroom;Assistance with cooking/housework;Direct supervision/assist for medications management;Direct supervision/assist for financial management;Assist for transportation;Help with stairs or ramp for entrance;Two people to help with walking and/or transfers   Equipment Recommendations  Other (comment) (TBD)    Recommendations for Other Services      Precautions / Restrictions Precautions Precautions: Fall Precaution Comments: R IJ catheter, LUE edema/pain, severe B knee pain Restrictions Weight Bearing Restrictions: No       Mobility Bed Mobility Overal bed mobility: Needs Assistance Bed Mobility: Rolling, Supine to Sit, Sit to Supine Rolling: Max assist   Supine to sit: Max assist, +2 for physical assistance, +2 for safety/equipment, HOB elevated Sit to supine: Max assist, +2 for physical assistance, +2 for safety/equipment, Total assist   General bed mobility comments: pt requires assist to manage trunk and to maneuver BLEs during bed mobility tasks, able to assist minimally with pulling trunk forward with transition to EOB using RUE (unable to assist functionally with LUE due to edema/pain); max to totalA for return to supine (via helicopter technique with therapist helping at trunk and BLE) secondary to pt increased fatigue and pain; partial roll to L side prior to sitting upright with maxA for cross-body reaching to rail    Transfers Overall transfer level: Needs assistance                Lateral/Scoot Transfers: Max assist, +2 physical assistance, Total assist General transfer comment: practiced lateral scoots on EOB with  pt needing MAX A  to totalA +2 to scoot hips towards [facility] with use of bed pad, encouraged pt to use RUE to assist but no significant self-initiated movement of body     Balance Overall balance assessment: Needs assistance Sitting-balance support: Single extremity supported, Feet supported Sitting balance-Leahy Scale: Fair Sitting balance - Comments: initially maxA trunk support due to posterior lean with c/o BLE pain, once bed height elevated ~3-4" pt able to tolerate B feet resting on floor with progression to close min guard/supervision for static sitting, ~12 mins total. Postural control: Posterior lean     Standing balance comment: deferred this session due to max B LE pain                           ADL either performed or assessed with clinical judgement   ADL Overall ADL's : Needs assistance/impaired     Grooming: Wash/dry face;Oral care;Sitting;Supervision/safety;Set up Grooming Details (indicate cue type and reason): sitting EOB             Lower Body Dressing: Total assistance;+2 for physical assistance;+2 for safety/equipment;Bed level Lower Body Dressing Details (indicate cue type and reason): to don socks             Functional mobility during ADLs: Total assistance;+2 for physical assistance;+2 for safety/equipment (lateral scoots EOB. Pt with little initiation) General ADL Comments: ADL participation impacted by increased pain, decreased activity tolerance and impaired strength/endurance    Extremity/Trunk Assessment Upper Extremity Assessment Upper Extremity Assessment: Generalized weakness;LUE deficits/detail LUE Deficits / Details: increased swelling in L hand , impaired grip strength, issues pt squeeze ball for edema mgmt   Lower Extremity Assessment Lower Extremity Assessment: Defer to PT evaluation        Vision   Vision Assessment?: No apparent visual deficits Additional Comments: WFL for tasks assessed. Requiring cues to open eyes with  onset of pain with movement in order to maintain safety and reach for bed rail.   Perception     Praxis      Cognition Arousal/Alertness: Awake/alert Behavior During Therapy: WFL for tasks assessed/performed Overall Cognitive Status: Impaired/Different from baseline Area of Impairment: Safety/judgement, Problem solving, Following commands                       Following Commands: Follows one step commands with increased time Safety/Judgement: Decreased awareness of deficits   Problem Solving: Slow processing, Requires verbal cues, Requires tactile cues, Decreased initiation General Comments: overall slow to process and slow to initiate tasks, perseverating on pain and anticipation of pain, cooperative with encouragement. Pt son present and able to assist with therapist questions at times.        Exercises Exercises: General Upper Extremity General Exercises - Upper Extremity Shoulder Flexion: Left, AROM, AAROM, PROM (3 reps, lifting during session) Digit Composite Flexion: AROM, Left, 5 reps Composite Extension: Left, PROM, AROM, AAROM    Shoulder Instructions       General Comments BP taken supine/seated in RLE (no options to use other extremities due to restrictions/IV and LLE pain) but possibly inaccurate readings, MAP (142) supine then reading (52) at edge of bed, pt asymptomatic and responding normally, denies lightheadedness, RN notified may need manual BP check next session for more accuracy. HR tachy to 120's bpm with bed mobility, improves to ~105 bpm with seated rest break after a few mins.    Pertinent Vitals/ Pain       Pain Assessment Pain Assessment:  PAINAD Faces Pain Scale: Hurts whole lot Breathing: occasional labored breathing, short period of hyperventilation Negative Vocalization: repeated troubled calling out, loud moaning/groaning, crying Facial Expression: facial grimacing Body Language: tense, distressed pacing, fidgeting Consolability:  distracted or reassured by voice/touch PAINAD Score: 7 Pain Location: B knees L>R/ L arm Pain Descriptors / Indicators: Sore, Discomfort, Grimacing, Guarding, Moaning Pain Intervention(s): Limited activity within patient's tolerance, Monitored during session, Ice applied, Relaxation, Repositioned, Premedicated before session, Other (comment) (dilaudid ~45 mins prior to mobility (tried immediately after meds given but pt deferred at that time), pt yelling out loudly during transition to EOB but reports pain down to 7/10 after sitting EOB ~5 mins)  Home Living                                          Prior Functioning/Environment              Frequency  Min 2X/week        Progress Toward Goals  OT Goals(current goals can now be found in the care plan section)  Progress towards OT goals: Progressing toward goals (gradually, and pain limited this session)  Acute Rehab OT Goals OT Goal Formulation: With patient Time For Goal Achievement: 02/06/22 Potential to Achieve Goals: Fair ADL Goals Pt Will Perform Grooming: with set-up;sitting Pt Will Perform Upper Body Bathing: with min assist;sitting Pt Will Perform Upper Body Dressing: with min assist;sitting Pt Will Transfer to Toilet: with mod assist;stand pivot transfer;bedside commode Pt/caregiver will Perform Home Exercise Program: Left upper extremity;Increased ROM;Increased strength;With minimal assist Additional ADL Goal #1: Pt will perform bed mobility with min assist in preparation for ADLs. Additional ADL Goal #2: Pt will follow two step commands with 75% accuracy.  Plan Discharge plan remains appropriate;Frequency remains appropriate    Co-evaluation    PT/OT/SLP Co-Evaluation/Treatment: Yes Reason for Co-Treatment: Complexity of the patient's impairments (multi-system involvement);Necessary to address cognition/behavior during functional activity;For patient/therapist safety;To address functional/ADL  transfers PT goals addressed during session: Mobility/safety with mobility;Balance OT goals addressed during session: ADL's and self-care      AM-PAC OT "6 Clicks" Daily Activity     Outcome Measure   Help from another person eating meals?: A Little Help from another person taking care of personal grooming?: A Little Help from another person toileting, which includes using toliet, bedpan, or urinal?: Total Help from another person bathing (including washing, rinsing, drying)?: A Lot Help from another person to put on and taking off regular upper body clothing?: A Lot Help from another person to put on and taking off regular lower body clothing?: Total 6 Click Score: 12    End of Session    OT Visit Diagnosis: Pain;Muscle weakness (generalized) (M62.81);Other symptoms and signs involving cognitive function Pain - Right/Left: Left Pain - part of body:  (BLE, LUE)   Activity Tolerance Patient limited by pain   Patient Left in bed;with call bell/phone within reach;with bed alarm set   Nurse Communication Mobility status;Other (comment) (Pain, hand IV out on arrivat)        Time: 1428-1510 OT Time Calculation (min): 42 min  Charges: OT General Charges $OT Visit: 1 Visit OT Treatments $Self Care/Home Management : 8-22 mins  Shanda Howells, OTR/L [facility] Acute Rehabilitation Office: [phone]   Lula Olszewski 01/29/2022, 4:16 PM

## 2022-01-29 NOTE — Progress Notes (Signed)
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 HD today and tolerating well.  Continue MWF schedule.  #Hyperkalemia, hyponatremia: Now managing with dialysis.  #Hypertension/volume: On carvedilol, UF during HD.  #Anemia of CKD and ABLA due to hematochezia: Plan for GI procedure when he is stable.  I will check iron studies, may benefit from ESA.  #CKD-MBD, hyperphosphatemia: On calcitriol, I will add calcium acetate with meals to manage hyperphosphatemia.  Monitor lab.  #Acute metabolic/uremic encephalopathy: Gradually improving.  #A-fib with RVR initially required diltiazem drip now on Coreg, Eliquis on hold because of GI bleed.  Subjective: Seen and examined in dialysis unit.  He is alert awake and tolerating dialysis well.  No new event.. Objective Vital signs in last 24 hours: Vitals:   01/29/22 0900 01/29/22 0930 01/29/22 1000 01/29/22 1030  BP: (!) 171/82 (!) 160/92 (!) 155/82 (!) 145/88  Pulse: 63 60 65 61  Resp: '13 13 10 14  '$ Temp:      TempSrc:      SpO2: 100% 100% 100% 100%  Weight:      Height:       Weight change: -2.7 kg  Intake/Output Summary (Last 24 hours) at 01/29/2022 1055 Last data filed at 01/28/2022 1400 Gross per 24 hour  Intake 120 ml  Output --  Net 120 ml       Labs: RENAL PANEL Recent Labs    01/15/22 1323 01/16/22 0410 01/17/22 0441 01/17/22 1005 01/18/22 0339 01/18/22 1042 01/20/22 0820 01/21/22 0258 01/22/22 0405 01/23/22 0450 01/24/22 8295 01/25/22 0602 01/26/22 0453 01/27/22 0745 01/28/22 0258 01/29/22 0800  NA  --    < > 132* 129* 129*   < > 133* 130* 135 133* 135 133* 135 131* 134* 134*  K  --    < > 5.8* 5.4* 4.8   < > 4.2 3.9 4.0 4.2 3.7 3.8 4.4 4.1 3.7 3.8  CL  --    < > 95* 93* 93*   < > 95* 92* 95* 92* 95* 94* 94* 93*  94* 96*  CO2  --    < > 18* 16* 17*   < > 19* '22 22 22 30 23 23 23 25 25  '$ GLUCOSE  --    < > 367* 360* 360*   < > 159* 233* 181* 304* 72 49* 260* 278* 179* 103*  BUN  --    < > 145* 149* 170*   < > 177* 140* 110* 140* 95* 116* 73* 116* 69* 112*  CREATININE  --    < > 8.49* 8.64* 9.54*   < > 9.55* 8.00* 6.83* 7.77* 6.35* 7.60* 5.81* 7.80* 5.61* 7.28*  CALCIUM  --    < > 7.4* 7.5* 7.1*   < > 7.9* 7.8* 8.1* 8.5* 8.2* 8.3* 8.5* 8.3* 8.2* 8.1*  MG 1.9  --  2.4  --   --   --   --   --   --   --   --  1.9  --   --   --   --   PHOS 4.9*  --  9.7* 9.7* 10.1*  --  10.2*  --  7.7*  --  6.1* 8.0* 7.5* 8.7* 6.4* 8.0*  ALBUMIN  --   --  1.7* 1.6* 1.5*   < >  1.6* 1.6* 1.6* 1.7* 1.6* 1.6* 1.7* 1.6* 1.7* 1.8*   < > = values in this interval not displayed.     Liver Function Tests: Recent Labs  Lab 01/23/22 0450 01/24/22 0841 01/27/22 0745 01/28/22 0258 01/29/22 0800  AST 35  --   --   --   --   ALT 48*  --   --   --   --   ALKPHOS 81  --   --   --   --   BILITOT 0.6  --   --   --   --   PROT 5.9*  --   --   --   --   ALBUMIN 1.7*   < > 1.6* 1.7* 1.8*   < > = values in this interval not displayed.   No results for input(s): "LIPASE", "AMYLASE" in the last 168 hours. No results for input(s): "AMMONIA" in the last 168 hours. CBC: Recent Labs    01/15/22 1323 01/16/22 0410 01/25/22 0602 01/26/22 0453 01/27/22 0744 01/28/22 0258 01/29/22 0800  HGB  --    < > 8.3* 9.1* 7.9* 8.5* 8.4*  MCV  --    < > 86.3 86.2 87.8 88.0 88.9  FERRITIN 381*  --   --   --   --   --   --   TIBC 171*  --   --   --   --   --   --   IRON 13*  --   --   --   --   --   --    < > = values in this interval not displayed.    Cardiac Enzymes: No results for input(s): "CKTOTAL", "CKMB", "CKMBINDEX", "TROPONINI" in the last 168 hours. CBG: Recent Labs  Lab 01/28/22 0752 01/28/22 1127 01/28/22 1711 01/28/22 2139 01/29/22 0650  GLUCAP 133* 188* 300* 232* 121*    Iron Studies: No results for input(s): "IRON",  "TIBC", "TRANSFERRIN", "FERRITIN" in the last 72 hours. Studies/Results: MR CERVICAL SPINE WO CONTRAST  Result Date: 01/28/2022 CLINICAL DATA:  Myelopathy, acute, cervical spine. Left upper extremity weakness. EXAM: MRI CERVICAL SPINE WITHOUT CONTRAST TECHNIQUE: Multiplanar, multisequence MR imaging of the cervical spine was performed. No intravenous contrast was administered. COMPARISON:  None Available. FINDINGS: Alignment: No significant listhesis is present. Straightening of the normal cervical lordosis is present. Vertebrae: Marrow signal and vertebral body heights are normal. Cord: Distortion of the ventral surface the cord is noted at C5 increased T2 signal on the left. Cord signal and morphology is otherwise within. Posterior Fossa, vertebral arteries, paraspinal tissues: Craniocervical junction is normal. Flow is present in the vertebral arteries bilaterally. A remote lacunar infarct present the central pons. Visualized intracranial contents are otherwise within normal limits. Disc levels: C2-3: Negative. C3-4: Rightward disc osteophyte complex partially effaces ventral CSF. Severe right foraminal stenosis is present. Left foramen is patent. C4-5: Shallow broad-based disc osteophyte complex is present. No significant stenosis is present. C5-6: A prominent central disc protrusion is present. Osteophytic spurring is noted. Canal is narrowed to 5 mm midline. Increased T2 signal is present in the left hemi cord. Mild foraminal narrowing is present bilaterally. C6-7: A broad-based disc osteophyte complex effaces the ventral CSF. Moderate foraminal stenosis is present bilaterally. C7-T1: Negative. IMPRESSION: 1. Prominent central disc protrusion at C5-6 with severe central canal stenosis. 2. Increased T2 signal in the left hemi cord at C5-6 compatible with chronic myelomalacia. 3. Mild foraminal narrowing bilaterally at C5-6. 4. Mild central and  moderate foraminal stenosis bilaterally at C6-7. 5. Severe right  foraminal stenosis at C3-4. Electronically Signed   By: San Morelle M.D.   On: 01/28/2022 19:41    Medications: Infusions:   Scheduled Medications:  abiraterone acetate  1,000 mg Oral Daily   calcitRIOL  0.25 mcg Oral Daily   carvedilol  25 mg Oral BID WC   Chlorhexidine Gluconate Cloth  6 each Topical Q0600   feeding supplement (NEPRO CARB STEADY)  237 mL Oral BID BM   heparin sodium (porcine)       insulin aspart  0-9 Units Subcutaneous TID WC   insulin aspart  8 Units Subcutaneous TID WC   insulin detemir  30 Units Subcutaneous BID   multivitamin  1 tablet Oral QHS   pantoprazole  40 mg Oral BID   predniSONE  5 mg Oral Q breakfast   sodium chloride flush  10-40 mL Intracatheter Q12H    have reviewed scheduled and prn medications.  Physical Exam: General:NAD, comfortable, alert awake Heart:RRR, s1s2 nl Lungs:clear b/l, no crackle Abdomen:soft, Non-tender, non-distended Extremities:No edema Dialysis Access: Right IJ TDC catheter site has old blood, no sign of active bleed dressing intact, left upper extremity AV fistula T/B+  [name] Prasad Eiza Canniff 01/29/2022,10:55 AM  LOS: 14 days

## 2022-01-29 NOTE — Consult Note (Signed)
Reason for Consult:cervical stenosis Referring Physician: hospitalist  [name] is an 66 y.o. male.   HPI:  66 year old male presented to the hospital about 2 weeks ago with difficulty ambulating and left arm weakness. States that he was ambulating with a walker before he came to the hospital. His son reports that his left arm has been weak for months to a year, he is unsure of a time. Patient denies any neck pain or arm pain numbness or tingling. He has had a recent gout flare up causing a lot of pain in his knees. Has a history of CKD, diabetes, afib, and currently has a GI bleed. He does not think that his weakness has gotten worse since hes been in the hospital but has stayed about the same.  Past Medical History:  Diagnosis Date   Arthritis    Cardiomyopathy    Resolved with EF 55% 2011   CARDIOMYOPATHY 04/22/2006   CARDIOMYOPATHY 04/22/2006   2- D Echo (04/2010) : The EF is probably 55% with some hypokinesis at the base of the inferior wall. Wall thickness was increased in a pattern of mild LVH. Patient followed by Dr Martinique (Cardiology)      CHF (congestive heart failure) ([place])    Chronic kidney disease    Diabetes mellitus    DIABETIC  RETINOPATHY 02/06/2007   GERD (gastroesophageal reflux disease)    Hyperlipemia    Hypertension    Iron deficiency 04/09/2018   NEPHROPATHY, DIABETIC 06/08/2006   Paroxysmal atrial tachycardia (HCC)     Past Surgical History:  Procedure Laterality Date   AV FISTULA PLACEMENT Left 06/06/2021   Procedure: LEFT BRACHIOCEPHALIC ARTERIOVENOUS (AV) FISTULA CREATION;  Surgeon: Broadus John, MD;  Location: [facility];  Service: Vascular;  Laterality: Left;  PERIPHERAL NERVE BLOCK   CARDIAC CATHETERIZATION  Dec 2007   normal; EF- 20-25%   FISTULA SUPERFICIALIZATION Left 01/09/2022   Procedure: LEFT BRACHIOCEPHALIC FISTULA SUPERFICIALIZATION;  Surgeon: Broadus John, MD;  Location: [facility];  Service: Vascular;  Laterality: Left;  PERIPHERAL NERVE  BLOCK   IR FLUORO GUIDE CV LINE RIGHT  01/18/2022   IR FLUORO GUIDE CV LINE RIGHT  01/24/2022   IR US GUIDE VASC ACCESS RIGHT  01/18/2022    No Known Allergies  Social History   Tobacco Use   Smoking status: Never    Passive exposure: Never   Smokeless tobacco: Never  Substance Use Topics   Alcohol use: No    Alcohol/week: 0.0 standard drinks of alcohol    Family History  Problem Relation Age of Onset   Diabetes Mother    Diabetes Brother      Review of Systems  Positive ROS: as above  All other systems have been reviewed and were otherwise negative with the exception of those mentioned in the HPI and as above.  Objective: Vital signs in last 24 hours: Temp:  [98 F (36.7 C)-98.7 F (37.1 C)] 98 F (36.7 C) (08/28 1614) Pulse Rate:  [54-87] 87 (08/28 1614) Resp:  [10-21] 12 (08/28 1614) BP: (116-174)/(43-97) 116/63 (08/28 1614) SpO2:  [97 %-100 %] 97 % (08/28 1614) Weight:  [114.8 kg-117.7 kg] 114.8 kg (08/28 1246)  General Appearance: Alert, cooperative, no distress, appears stated age Head: Normocephalic, without obvious abnormality, atraumatic Eyes: PERRL, conjunctiva/corneas clear, EOM's intact, fundi benign, both eyes      Neck: Supple, symmetrical, trachea midline, no adenopathy; thyroid: No enlargement/tenderness/nodules; no carotid bruit or JVD Back: Symmetric, no curvature, ROM normal,  no CVA tenderness Lungs:  respirations unlabored Heart: a fib Extremities: Extremities normal, atraumatic, no cyanosis or edema Pulses: 2+ and symmetric all extremities Skin: Skin color, texture, turgor normal, no rashes or lesions  NEUROLOGIC:   Mental status: A&O x4, no aphasia, good attention span, Memory and fund of knowledge Motor Exam - BLE 2/5, LUE 3/5 Sensory Exam - grossly normal Reflexes: symmetric, no pathologic reflexes, No Hoffman's, No clonus Coordination - grossly normal Gait - unable to test Balance - unable to test Cranial Nerves: I: smell Not tested   II: visual acuity  OS: na    OD: na  II: visual fields Full to confrontation  II: pupils Equal, round, reactive to light  III,VII: ptosis None  III,IV,VI: extraocular muscles  Full ROM  V: mastication   V: facial light touch sensation    V,VII: corneal reflex    VII: facial muscle function - upper    VII: facial muscle function - lower   VIII: hearing   IX: soft palate elevation    IX,X: gag reflex   XI: trapezius strength    XI: sternocleidomastoid strength   XI: neck flexion strength    XII: tongue strength      Data Review Lab Results  Component Value Date   WBC 16.0 (H) 01/29/2022   HGB 8.4 (L) 01/29/2022   HCT 26.4 (L) 01/29/2022   MCV 88.9 01/29/2022   PLT 335 01/29/2022   Lab Results  Component Value Date   NA 134 (L) 01/29/2022   K 3.8 01/29/2022   CL 96 (L) 01/29/2022   CO2 25 01/29/2022   BUN 112 (H) 01/29/2022   CREATININE 7.28 (H) 01/29/2022   GLUCOSE 103 (H) 01/29/2022   Lab Results  Component Value Date   INR 1.3 (H) 01/15/2022    Radiology: US RENAL  Result Date: 01/16/2022 CLINICAL DATA:  AKI. EXAM: RENAL / URINARY TRACT ULTRASOUND COMPLETE COMPARISON:  08/17/2021. FINDINGS: Right Kidney: Renal measurements: 10.5 x 5.4 x 4.8 cm = volume: 142.05 mL. Echogenicity within normal limits. No mass or hydronephrosis visualized. Left Kidney: Renal measurements: 10.3 x 5.9 x 4.6 cm = volume: 146.72 mL. Echogenicity within normal limits. An avascular hypoechoic region is noted in the mid left kidney measuring 2.5 x 1.7 cm, possible cyst. No mass or hydronephrosis visualized. Bladder: Appears normal for degree of bladder distention. Other: Examination is limited due to patient's body habitus and limited mobility. IMPRESSION: 1. Avascular hypoechoic structure in the mid left kidney, possible cyst. 2. Normal evaluation of the right kidney and bladder. Electronically Signed   By: Brett Fairy M.D.   On: 01/16/2022 20:16   MR BRAIN WO CONTRAST  Result Date:  01/16/2022 CLINICAL DATA:  Neuro deficit, acute, stroke suspected. EXAM: MRI HEAD WITHOUT CONTRAST TECHNIQUE: Multiplanar, multiecho pulse sequences of the brain and surrounding structures were obtained without intravenous contrast. COMPARISON:  01/15/2022 CT FINDINGS: Brain: Diffusion imaging does not show any acute or subacute infarction or other cause of restricted diffusion. There is generalized brain atrophy, advanced for age. Old small vessel infarctions affect the central pons. Few old small vessel cerebellar infarctions. Cerebral hemispheres show old infarctions of the left basal ganglia and radiating white matter tracts. Mild chronic small-vessel ischemic change seen elsewhere. No large vessel territory infarction. No mass, hemorrhage, hydrocephalus or extra-axial collection. Vascular: Major vessels at the base of the brain show flow. Skull and upper cervical spine: Negative Sinuses/Orbits: Clear/normal Other: None IMPRESSION: No acute MR finding. Accelerated generalized atrophy. Old  small vessel infarctions of the pons, cerebellum, left basal ganglia/radiating white matter tracts, and small-vessel change of the cerebral hemispheric white matter. Electronically Signed   By: Nelson Chimes M.D.   On: 01/16/2022 16:12   DG Hand 2 View Left  Result Date: 01/16/2022 CLINICAL DATA:  Pain and swelling after falling from bed. EXAM: LEFT HAND - 2 VIEW COMPARISON:  None Available. FINDINGS: Generalized regional swelling. No evidence fracture or dislocation. Small erosions are noted at several of the inter phalangeal joints which could be seen with erosive arthritis including erosive osteoarthritis. IMPRESSION: No acute finding other than soft tissue swelling. 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 Electronically Signed   By: Nelson Chimes M.D.   On: 01/16/2022 15:07   DG Chest 1 View  Result Date: 01/16/2022 CLINICAL DATA:  Sepsis. EXAM: CHEST  1 VIEW COMPARISON:   01/15/2022 FINDINGS: 0505 hours. The cardio pericardial silhouette is enlarged. Low volume film. Bibasilar atelectasis noted. There is pulmonary vascular congestion without overt pulmonary edema. Telemetry leads overlie the chest. IMPRESSION: Enlarged cardiopericardial silhouette with vascular congestion. Electronically Signed   By: Misty Stanley M.D.   On: 01/16/2022 06:00   DG Knee Complete 4 Views Right  Result Date: 01/15/2022 CLINICAL DATA:  Fall, pain and swelling, initial encounter. EXAM: RIGHT KNEE - COMPLETE 4+ VIEW COMPARISON:  None Available. FINDINGS: Large joint effusion. No fracture. Slight varus angulation of the knee joint with minimal lateral subluxation of the proximal tibia with respect to the distal femur. Marked loss of joint space in the medial compartment with subchondral sclerosis and osteophytosis. Additional osteophytosis in the medial and patellofemoral compartments. IMPRESSION: 1. Moderate to large joint effusion.  No fracture. 2. 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 Electronically Signed   By: Lorin Picket M.D.   On: 01/15/2022 13:53   CT Head Wo Contrast  Result Date: 01/15/2022 CLINICAL DATA:  Fall off a bed on Friday. EXAM: CT HEAD WITHOUT CONTRAST TECHNIQUE: Contiguous axial images were obtained from the base of the skull through the vertex without intravenous contrast. RADIATION DOSE REDUCTION: This exam was performed according to the departmental dose-optimization program which includes automated exposure control, adjustment of the mA and/or kV according to patient size and/or use of iterative reconstruction technique. COMPARISON:  None Available. FINDINGS: Brain: There is no acute intracranial hemorrhage, extra-axial fluid collection, or acute infarct. There is mild volume loss primarily affecting the frontal lobes. The ventricles are normal in size. Gray-white differentiation is preserved. There is no mass lesion.  There is no mass effect or  midline shift. Vascular: There is calcification of the bilateral cavernous ICAs. Skull: Normal. Negative for fracture or focal lesion. Sinuses/Orbits: The paranasal sinuses are clear. The globes and orbits are unremarkable. Other: None. IMPRESSION: No acute intracranial pathology. Electronically Signed   By: Valetta Mole M.D.   On: 01/15/2022 12:30   DG Chest Port 1 View  Result Date: 01/15/2022 CLINICAL DATA:  Questionable sepsis. EXAM: PORTABLE CHEST 1 VIEW COMPARISON:  Chest radiograph dated April 20, 2008 FINDINGS: The heart is mildly enlarged. Chronic elevation of the right hemidiaphragm. Lungs are otherwise clear without evidence of focal consolidation or large pleural effusion. IMPRESSION: 1.  Stable mild cardiomegaly. 2. Chronic elevation of the right hemidiaphragm. No evidence of pneumonia or pleural effusion. Electronically Signed   By: Keane Police D.O.   On: 01/15/2022 10:54    Assessment/Plan: 66 year old male presented to the hospital with progressive weakness. MRI  cervical spine shows severe spinal stenosis at C5-6 with cord compression. Difficult to know if he has signal change in his cord. He is currently bed bound and it is very difficult to examine him because it causes severe pain when we touch him anywhere. He could be a candidate for a C5-6 ACDF but we question whether he is medically stable enough at this point? The soonest we would do this would be this Friday. Discussed case with Dr. Ronnald Ramp and examined the patient with him as well. We will reassess him in the morning.    Ocie Cornfield [facility] 01/29/2022 4:57 PM

## 2022-01-29 NOTE — TOC Progression Note (Signed)
Transition of Care [facility]) - Progression Note    Patient Details  Name: [name] MRN: [id number] Date of Birth: 05-30-1956  [facility]) CM/SW [place], LCSW Phone Number: 01/29/2022, 4:39 PM  Clinical Narrative:    10am-Per MD, patient may be medically stable for discharge tomorrow. CSW notified [employer] and requested updated PT note for insurance authorization.  4:15pm-CSW uploaded clinicals to [facility] for review, Ref# [id number].    Expected Discharge Plan: Skilled Nursing Facility Barriers to Discharge: Ship broker, Continued Medical Work up, Waiting for outpatient dialysis, SNF Pending bed offer  Expected Discharge Plan and Services Expected Discharge Plan: [place] In-house Referral: Clinical Social Work   Post Acute Care Choice: [place] Living arrangements for the past 2 months: Single Family Home                                       Social Determinants of Health (SDOH) Interventions    Readmission Risk Interventions     No data to display

## 2022-01-30 ENCOUNTER — Other Ambulatory Visit: Payer: Self-pay | Admitting: Neurological Surgery

## 2022-01-30 DIAGNOSIS — A419 Sepsis, unspecified organism: Secondary | ICD-10-CM | POA: Diagnosis not present

## 2022-01-30 DIAGNOSIS — I10 Essential (primary) hypertension: Secondary | ICD-10-CM | POA: Diagnosis not present

## 2022-01-30 DIAGNOSIS — N185 Chronic kidney disease, stage 5: Secondary | ICD-10-CM | POA: Diagnosis not present

## 2022-01-30 DIAGNOSIS — D62 Acute posthemorrhagic anemia: Secondary | ICD-10-CM | POA: Diagnosis not present

## 2022-01-30 LAB — URIC ACID: Uric Acid, Serum: 5.1 mg/dL (ref 3.7–8.6)

## 2022-01-30 LAB — GLUCOSE, CAPILLARY
Glucose-Capillary: 308 mg/dL — ABNORMAL HIGH (ref 70–99)
Glucose-Capillary: 265 mg/dL — ABNORMAL HIGH (ref 70–99)
Glucose-Capillary: 328 mg/dL — ABNORMAL HIGH (ref 70–99)
Glucose-Capillary: 283 mg/dL — ABNORMAL HIGH (ref 70–99)

## 2022-01-30 LAB — IRON AND TIBC
Iron: 20 ug/dL — ABNORMAL LOW (ref 45–182)
Saturation Ratios: 7 % — ABNORMAL LOW (ref 17.9–39.5)
TIBC: 279 ug/dL (ref 250–450)
UIBC: 259 ug/dL

## 2022-01-30 LAB — FERRITIN: Ferritin: 338 ng/mL — ABNORMAL HIGH (ref 24–336)

## 2022-01-30 MED ORDER — PREDNISONE 20 MG PO TABS
20.0000 mg | ORAL_TABLET | Freq: Every day | ORAL | Status: DC
  Administered 2022-01-30: 20 mg via ORAL
  Filled 2022-01-30: qty 1

## 2022-01-30 MED ORDER — SODIUM CHLORIDE 0.9 % IV SOLN
125.0000 mg | INTRAVENOUS | Status: DC
  Administered 2022-01-31 – 2022-02-02 (×3): 125 mg via INTRAVENOUS
  Filled 2022-01-30 (×5): qty 10

## 2022-01-30 MED ORDER — CHLORHEXIDINE GLUCONATE CLOTH 2 % EX PADS: 6.0000 | MEDICATED_PAD | Freq: Every day | CUTANEOUS | Status: DC

## 2022-01-30 MED ORDER — PREDNISONE 5 MG PO TABS
5.0000 mg | ORAL_TABLET | Freq: Every day | ORAL | Status: DC
  Administered 2022-01-31 – 2022-02-01 (×2): 5 mg via ORAL
  Filled 2022-01-30 (×2): qty 1

## 2022-01-30 MED ORDER — COLCHICINE 0.3 MG HALF TABLET
0.3000 mg | ORAL_TABLET | Freq: Every day | ORAL | Status: DC
  Administered 2022-01-30 – 2022-02-02 (×3): 0.3 mg via ORAL
  Filled 2022-01-30 (×4): qty 1

## 2022-01-30 NOTE — Progress Notes (Signed)
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 She had dialysis yesterday around 3 L ultrafiltration, tolerating well.  We will plan for another HD tomorrow  #Hyperkalemia, hyponatremia: Now managing with dialysis.  #Hypertension/volume: On carvedilol, UF during HD.  #Anemia of CKD and ABLA due to hematochezia: Plan for GI procedure when he is stable.  Iron saturation only 7% therefore I will order IV iron with dialysis and ESA.  #CKD-MBD, hyperphosphatemia: On calcitriol, started calcium acetate with meals to manage hyperphosphatemia.  Monitor lab.  #Acute metabolic/uremic encephalopathy: Gradually improving.  #A-fib with RVR initially required diltiazem drip now on Coreg, Eliquis on hold because of GI bleed.  Subjective: Seen and examined.  No new event.  Seems like he is tolerating dialysis well.  He is alert awake and denies any complaints.  Denies shortness of breath, chest pain, nausea, vomiting, dysgeusia. Objective Vital signs in last 24 hours: Vitals:   01/30/22 0400 01/30/22 0459 01/30/22 0800 01/30/22 0906  BP: 136/61  (!) 145/72   Pulse: 83  81   Resp: 16  17   Temp: 98.5 F (36.9 C)   98.5 F (36.9 C)  TempSrc: Axillary   Oral  SpO2: 95%  97%   Weight:  118.6 kg    Height:       Weight change: 0.3 kg  Intake/Output Summary (Last 24 hours) at 01/30/2022 1123 Last data filed at 01/30/2022 1761 Gross per 24 hour  Intake 363 ml  Output 153 ml  Net 210 ml        Labs: RENAL PANEL Recent Labs    01/15/22 1323 01/16/22 0410 01/17/22 0441 01/17/22 1005 01/18/22 0339 01/18/22 1042 01/20/22 0820 01/21/22 0258 01/22/22 0405 01/23/22 0450 01/24/22 6073 01/25/22 0602 01/26/22 0453 01/27/22 0745 01/28/22 0258 01/29/22 0800  NA  --    < > 132* 129* 129*    < > 133* 130* 135 133* 135 133* 135 131* 134* 134*  K  --    < > 5.8* 5.4* 4.8   < > 4.2 3.9 4.0 4.2 3.7 3.8 4.4 4.1 3.7 3.8  CL  --    < > 95* 93* 93*   < > 95* 92* 95* 92* 95* 94* 94* 93* 94* 96*  CO2  --    < > 18* 16* 17*   < > 19* '22 22 22 30 23 23 23 25 25  '$ GLUCOSE  --    < > 367* 360* 360*   < > 159* 233* 181* 304* 72 49* 260* 278* 179* 103*  BUN  --    < > 145* 149* 170*   < > 177* 140* 110* 140* 95* 116* 73* 116* 69* 112*  CREATININE  --    < > 8.49* 8.64* 9.54*   < > 9.55* 8.00* 6.83* 7.77* 6.35* 7.60* 5.81* 7.80* 5.61* 7.28*  CALCIUM  --    < > 7.4* 7.5* 7.1*   < > 7.9* 7.8* 8.1* 8.5* 8.2* 8.3* 8.5* 8.3* 8.2* 8.1*  MG 1.9  --  2.4  --   --   --   --   --   --   --   --  1.9  --   --   --   --   PHOS 4.9*  --  9.7* 9.7* 10.1*  --  10.2*  --  7.7*  --  6.1* 8.0* 7.5* 8.7* 6.4* 8.0*  ALBUMIN  --   --  1.7* 1.6* 1.5*   < > 1.6* 1.6* 1.6* 1.7* 1.6* 1.6* 1.7* 1.6* 1.7* 1.8*   < > = values in this interval not displayed.      Liver Function Tests: Recent Labs  Lab 01/27/22 0745 01/28/22 0258 01/29/22 0800  ALBUMIN 1.6* 1.7* 1.8*    No results for input(s): "LIPASE", "AMYLASE" in the last 168 hours. No results for input(s): "AMMONIA" in the last 168 hours. CBC: Recent Labs    01/15/22 1323 01/16/22 0410 01/25/22 0602 01/26/22 0453 01/27/22 0744 01/28/22 0258 01/29/22 0800 01/30/22 0335  HGB  --    < > 8.3* 9.1* 7.9* 8.5* 8.4*  --   MCV  --    < > 86.3 86.2 87.8 88.0 88.9  --   FERRITIN 381*  --   --   --   --   --   --  338*  TIBC 171*  --   --   --   --   --   --  279  IRON 13*  --   --   --   --   --   --  20*   < > = values in this interval not displayed.     Cardiac Enzymes: No results for input(s): "CKTOTAL", "CKMB", "CKMBINDEX", "TROPONINI" in the last 168 hours. CBG: Recent Labs  Lab 01/29/22 0650 01/29/22 1311 01/29/22 1613 01/29/22 2022 01/30/22 0833  GLUCAP 121* 90 275* 250* 308*     Iron Studies:  Recent Labs    01/30/22 0335  IRON 20*   TIBC 279  FERRITIN 338*   Studies/Results: MR CERVICAL SPINE WO CONTRAST  Result Date: 01/28/2022 CLINICAL DATA:  Myelopathy, acute, cervical spine. Left upper extremity weakness. EXAM: MRI CERVICAL SPINE WITHOUT CONTRAST TECHNIQUE: Multiplanar, multisequence MR imaging of the cervical spine was performed. No intravenous contrast was administered. COMPARISON:  None Available. FINDINGS: Alignment: No significant listhesis is present. Straightening of the normal cervical lordosis is present. Vertebrae: Marrow signal and vertebral body heights are normal. Cord: Distortion of the ventral surface the cord is noted at C5 increased T2 signal on the left. Cord signal and morphology is otherwise within. Posterior Fossa, vertebral arteries, paraspinal tissues: Craniocervical junction is normal. Flow is present in the vertebral arteries bilaterally. A remote lacunar infarct present the central pons. Visualized intracranial contents are otherwise within normal limits. Disc levels: C2-3: Negative. C3-4: Rightward disc osteophyte complex partially effaces ventral CSF. Severe right foraminal stenosis is present. Left foramen is patent. C4-5: Shallow broad-based disc osteophyte complex is present. No significant stenosis is present. C5-6: A prominent central disc protrusion is present. Osteophytic spurring is noted. Canal is narrowed to 5 mm midline. Increased T2 signal is present in the left hemi cord. Mild foraminal narrowing is present bilaterally. C6-7: A broad-based disc osteophyte complex effaces the ventral CSF. Moderate foraminal stenosis is present bilaterally. C7-T1: Negative. IMPRESSION: 1. Prominent central disc protrusion at C5-6 with severe central canal stenosis. 2. Increased T2 signal in the left hemi cord at C5-6 compatible with chronic myelomalacia. 3. Mild foraminal narrowing bilaterally at C5-6. 4. Mild central and moderate foraminal stenosis bilaterally at C6-7. 5. Severe right foraminal stenosis at  C3-4. Electronically Signed   By: San Morelle M.D.   On: 01/28/2022 19:41    Medications: Infusions:   Scheduled Medications:  abiraterone  acetate  1,000 mg Oral Daily   calcitRIOL  0.25 mcg Oral Daily   calcium acetate  1,334 mg Oral TID WC   carvedilol  25 mg Oral BID WC   Chlorhexidine Gluconate Cloth  6 each Topical Q0600   colchicine  0.3 mg Oral Daily   feeding supplement (NEPRO CARB STEADY)  237 mL Oral BID BM   insulin aspart  0-9 Units Subcutaneous TID WC   insulin aspart  8 Units Subcutaneous TID WC   insulin detemir  30 Units Subcutaneous BID   multivitamin  1 tablet Oral QHS   pantoprazole  40 mg Oral BID   [START ON 01/31/2022] predniSONE  5 mg Oral Q breakfast   sodium chloride flush  10-40 mL Intracatheter Q12H    have reviewed scheduled and prn medications.  Physical Exam: General:NAD, comfortable, alert awake Heart:RRR, s1s2 nl Lungs:clear b/l, no crackle Abdomen:soft, Non-tender, non-distended Extremities:No leg edema Dialysis Access: Right IJ TDC catheter site has old blood, no sign of active bleed dressing intact, left upper extremity AV fistula T/B+, the arm is swollen.  Peri Kreft Prasad Hennie Gosa 01/30/2022,11:23 AM  LOS: 15 days

## 2022-01-30 NOTE — TOC Progression Note (Signed)
Transition of Care [facility]) - Progression Note    Patient Details  Name: [name] MRN: [id number] Date of Birth: 1955/07/14  Transition of Care [facility]) CM/SW [place], LCSW Phone Number: 01/30/2022, 12:28 PM  Clinical Narrative:    Insurance approval received for [employer], Ref# [id number], effective 01/30/22-02/01/22. Will follow for medical readiness.    Expected Discharge Plan: Skilled Nursing Facility Barriers to Discharge: Ship broker, Continued Medical Work up, Waiting for outpatient dialysis, SNF Pending bed offer  Expected Discharge Plan and Services Expected Discharge Plan: [place] In-house Referral: Clinical Social Work   Post Acute Care Choice: [place] Living arrangements for the past 2 months: Single Family Home                                       Social Determinants of Health (SDOH) Interventions    Readmission Risk Interventions     No data to display

## 2022-01-30 NOTE — Progress Notes (Signed)
Patient ID: [name], male   DOB: 01/22/1956, 66 y.o.   MRN: [id number]  No change in left arm and hand weakness.  Great difficulty making a fist on the left.  Decreased dexterity.  He states he has been having some trouble dexterity in the hands for a while.    His MRI does show significant stenosis at C5-6 with cord compression and probable signal change in the cord.  I have spoken with his medical doctor and we are going to try to decide if he is medically stable for surgery this Friday.  I have explained the surgery in the reasoning for the surgery to the patient and his son.  I have tried to answer their questions.  We would recommend a ACDF with plating at C5-6.  We will wait for medical clearance.

## 2022-01-30 NOTE — Progress Notes (Addendum)
PROGRESS NOTE        PATIENT DETAILS Name: [name] Age: 66 y.o. Sex: male Date of Birth: 1955-06-05 Admit Date: 01/15/2022 Admitting Physician Lequita Halt, MD SKA:JGOTL, Malva Limes., FNP  Brief Summary: Patient is a 66 y.o.  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  Started on IV steroids for gouty arthritis-evaluated by nephrology and started on HD.  Hospital course complicated by A-fib RVR, lower GI bleeding and slow clinical improvement.  See below for further details.    Significant events: 8/14>> admit for confusion/gouty arthritis/progression to ESRD. 8/16>> worsening transaminases/encephalopathy/renal function-renal planning HD. 8/17>> TDC placed-first HD planned. 8/18>> Afib RVR with first HD session-HD cut short. Spontaneously converted to sinus rhythm.Oozing from nontunneled HD cath site.  8/19>> Brief run of RVR.  Tolerated HD without any incident. 8/20>> Worsening encephalopathy-urgent HD, 2 episodes of painless small-volume hematochezia. Hemoglobin dropped to 6.7-given 2 units of PRBC with HD.  8/21>> less encephalopathic-answering simple questions-another episode (third) of hematochezia earlier this morning. 8/24>> intra-articular steroid injections to bilateral knee 8/25>> small-volume hematochezia reoccurred  Significant studies: 8/15>> MRI brain: No acute CVA. 8/15>> renal ultrasound: No hydronephrosis. 8/14>> left knee synovial fluid: WBC 23,835, extracellular sodium urate crystals. 8/15>> right knee synovial fluid: WBC 15,600, intracellular monosodium urate crystals. 8/16>> Echo: EF 55-60% 8/16>> RUQ ultrasound: No major abnormalities noted. 8/28 MRI C Spine - 1. Prominent central disc protrusion at C5-6 with severe  central canal stenosis. 2. Increased T2 signal in the left hemi cord at C5-6 compatible with chronic myelomalacia. 3. Mild foraminal narrowing bilaterally at C5-6. 4. Mild central and moderate foraminal stenosis bilaterally at C6-7. 5. Severe right foraminal stenosis at C3-4.    Significant microbiology data: 8/14>> blood culture: No growth 8/14>> left knee synovial fluid: No growth 8/15>> right knee synovial fluid: No growth  Procedures: 8/14>> left knee arthrocentesis 8/15>> right knee arthrocentesis 8/17>> tunneled HD catheter placement by IR. 8/24>> intra-articular steroid injection to bilateral knee by orthopedics.  Consults: Nephrology. Orthopedics Cardiology Vascular ID GI Neurosurgery   Subjective:  Patient in bed, appears comfortable, denies any headache, no fever, no chest pain or pressure, no shortness of breath , no abdominal pain. No new focal weakness.    Objective: Vitals: Blood pressure (!) 145/72, pulse 81, temperature 98.5 F (36.9 C), temperature source Oral, resp. rate 17, height '5\' 11"'$  (1.803 m), weight 118.6 kg, SpO2 97 %.   Exam:  Awake Alert, No new F.N deficits, chronic left upper extremity grip weakness for several months .AT,PERRAL Supple Neck, No JVD,   Symmetrical Chest wall movement, Good air movement bilaterally, CTAB RRR,No Gallops, Rubs or new Murmurs,  +ve B.Sounds, Abd Soft, No tenderness,   No Cyanosis, Clubbing or edema     Assessment/Plan:  SIRS due to acute gouty arthritis involving bilateral knees, numerous joints of left hand: Bilateral knee/left hand arthralgias better-patient underwent intra-articular steroid injection to bilateral knee on 8/24.  He did not have a significant response to IV Solu-Medrol/prednisone.  Suspect that patient not only has acute gouty arthritis but significant amount of OA at baseline as well.  No evidence of infection-blood/sign of blood cultures negative.  Septic arthritis/sepsis  ruled out.   Evaluated by infectious disease during the earlier part of this hospitalization-with recommendations to continue to monitor off antibiotics.  Orthopedics (Dr. Alvan Dame) continues to follow periodically  CKD stage V with progression to ESRD: Encephalopathy better-nephrology following and directing HD care.  Hyperkalemia: Due to worsening renal function-resolved with Lokelma and HD.  Continue to monitor periodically.  Oozing from tunneled HD catheter site: Evaluated by IR on 8/18-bleeding site sutured-no further bleeding evident.  A-fib with RVR: Triggered by uremia/acute illness-now maintaining sinus rhythm on oral beta-blocker-Eliquis on hold due to ongoing lower GI bleeding.  Cardiology has signed off.  Lower GI bleeding: Has had intermittent hematochezia for the past few days-last episode on 8/25.  Did require 2 units of PRBC transfusion on 8/20.  GI consulted but given lingering encephalopathy-severe debility-felt to be too weak to tolerate a colonoscopy prep-with recommendations to reconsult GI when encephalopathy/debility have improved.  GI recommended to hold Eliquis 5 days from 8/22-but since patient had another episode of hematochezia on 8/25-we will plan to hold Eliquis for a few more days.  Thankfully his encephalopathy is better-he has started to participate a bit more with therapy-suspect if this trend continues-we will need to reconsult GI sometime early next week.  In the meantime-we will plan to continue to follow CBC and transfuse accordingly.  Normocytic anemia: Multifactorial etiology-due to acute illness/CKD/acute blood loss anemia due to lower GI bleeding.  Transfused 2 units of PRBC on 8/20-slight drop in hemoglobin today-in the absence of active GI bleeding-this is likely due to equilibration.  Repeat CBC tomorrow morning or sooner if patient has another episode of lower GI bleeding.  We will plan on transfusion if significant drop in hemoglobin or if recurrent hematochezia recurs.     Acute metabolic encephalopathy: Due to AKI/uremia-improving.  Much more awake and alert today.    Transaminitis: Unclear etiology-suspect probably medication related-possibly related to cefepime.  Statin/Zytiga was held-LFTs have improved.  RUQ ultrasound was unremarkable.    Hyponatremia: Related to volume-mild-should improve with further HD episodes.  Chronic left arm weakness: Per patient's son-this has been ongoing for the past several months-son has noted a problem with the patient's grip mostly.  Due to left hand arthralgia/L UE swelling (after superficialization of AV fistula)-this has worsened somewhat.  However over the past few days-with decreasing swelling/arthralgias-he seems to move his left arm more fluently.  MRI brain negative for CVA.  Noted MRI of C-spine, will DW NS.  Family thinking about surgical correction this admission versus few months down the line.  We will be a moderate to high risk candidate for adverse cardiopulmonary outcome during the perioperative period.  Superficialization of left upper extremity AV fistula by vascular surgery on 8/8: With some residual arm swelling that seems to have that had worsened but has since improved over the past few days with elevation.  Appreciate vascular surgery input.  Continue to follow closely.    Prostate cancer: On prednisone/Zytiga chronically.  Zytiga briefly held due to elevated LFTs-has been tapered back to his usual prednisone dosing  DM-2 (A1c 9.6 on 8/14) with uncontrolled hyperglycemia due to steroids: CBGs fluctuating quite a bit-now mostly on the higher side-increase Levemir to 26 units twice daily, increase Premeal NovoLog to 8 units.  Follow/optimize.   Will need to permanently stop glipizide and metformin on discharge.  Lab Results  Component Value Date   HGBA1C 9.6 (H) 01/15/2022   CBG (last 3)  Recent Labs    01/29/22 1613 01/29/22 2022  01/30/22 0833  GLUCAP 275* 250* 308*     Morbid  Obesity: Estimated body mass index is 36.47 kg/m as calculated from the following:   Height as of this encounter: '5\' 11"'$  (1.803 m).   Weight as of this encounter: 118.6 kg.   Code status:   Code Status: Full Code   DVT Prophylaxis:Hold Eliquis due to ongoing hematochezia.   Family Communication: Son at bedside on 01/30/2022.   Disposition Plan: Status is: Inpatient Remains inpatient appropriate because: Likely progression to ESRD-nephrology initiating dialysis-remains encephalopathic-continues to have intermittent hematochezia-needs colonoscopy-not yet stable for discharge.   Planned Discharge Destination:SNF  Diet: Diet Order             Diet renal/carb modified with fluid restriction Diet-HS Snack? Nothing; Fluid restriction: 1200 mL Fluid; Room service appropriate? Yes; Fluid consistency: Thin  Diet effective now                   MEDICATIONS: Scheduled Meds:  abiraterone acetate  1,000 mg Oral Daily   calcitRIOL  0.25 mcg Oral Daily   calcium acetate  1,334 mg Oral TID WC   carvedilol  25 mg Oral BID WC   Chlorhexidine Gluconate Cloth  6 each Topical Q0600   colchicine  0.3 mg Oral Daily   feeding supplement (NEPRO CARB STEADY)  237 mL Oral BID BM   insulin aspart  0-9 Units Subcutaneous TID WC   insulin aspart  8 Units Subcutaneous TID WC   insulin detemir  30 Units Subcutaneous BID   multivitamin  1 tablet Oral QHS   pantoprazole  40 mg Oral BID   [START ON 01/31/2022] predniSONE  5 mg Oral Q breakfast   sodium chloride flush  10-40 mL Intracatheter Q12H   Continuous Infusions: PRN Meds:.acetaminophen, hydrALAZINE, HYDROmorphone (DILAUDID) injection, lidocaine (PF), lidocaine-prilocaine, LORazepam, metoprolol tartrate, ondansetron (ZOFRAN) IV, pentafluoroprop-tetrafluoroeth, sodium chloride   I have personally reviewed following labs and imaging studies  LABORATORY DATA:  Recent Labs  Lab 01/25/22 0602 01/26/22 0453 01/27/22 0744 01/28/22 0258  01/29/22 0800  WBC 17.0* 19.4* 17.3* 17.0* 16.0*  HGB 8.3* 9.1* 7.9* 8.5* 8.4*  HCT 25.2* 27.5* 25.1* 26.5* 26.4*  PLT 254 278 309 333 335  MCV 86.3 86.2 87.8 88.0 88.9  MCH 28.4 28.5 27.6 28.2 28.3  MCHC 32.9 33.1 31.5 32.1 31.8  RDW 15.4 15.2 15.1 14.9 14.9    Recent Labs  Lab 01/25/22 0602 01/26/22 0453 01/27/22 0745 01/28/22 0258 01/29/22 0800  NA 133* 135 131* 134* 134*  K 3.8 4.4 4.1 3.7 3.8  CL 94* 94* 93* 94* 96*  CO2 '23 23 23 25 25  '$ GLUCOSE 49* 260* 278* 179* 103*  BUN 116* 73* 116* 69* 112*  CREATININE 7.60* 5.81* 7.80* 5.61* 7.28*  CALCIUM 8.3* 8.5* 8.3* 8.2* 8.1*  ALBUMIN 1.6* 1.7* 1.6* 1.7* 1.8*  MG 1.9  --   --   --   --   PHOS 8.0* 7.5* 8.7* 6.4* 8.0*   RADIOLOGY STUDIES/RESULTS: MR CERVICAL SPINE WO CONTRAST  Result Date: 01/28/2022 CLINICAL DATA:  Myelopathy, acute, cervical spine. Left upper extremity weakness. EXAM: MRI CERVICAL SPINE WITHOUT CONTRAST TECHNIQUE: Multiplanar, multisequence MR imaging of the cervical spine was performed. No intravenous contrast was administered. COMPARISON:  None Available. FINDINGS: Alignment: No significant listhesis is present. Straightening of the normal cervical lordosis is present. Vertebrae: Marrow signal and vertebral body heights are normal. Cord: Distortion of the ventral surface the cord is noted at C5 increased T2 signal  on the left. Cord signal and morphology is otherwise within. Posterior Fossa, vertebral arteries, paraspinal tissues: Craniocervical junction is normal. Flow is present in the vertebral arteries bilaterally. A remote lacunar infarct present the central pons. Visualized intracranial contents are otherwise within normal limits. Disc levels: C2-3: Negative. C3-4: Rightward disc osteophyte complex partially effaces ventral CSF. Severe right foraminal stenosis is present. Left foramen is patent. C4-5: Shallow broad-based disc osteophyte complex is present. No significant stenosis is present. C5-6: A prominent  central disc protrusion is present. Osteophytic spurring is noted. Canal is narrowed to 5 mm midline. Increased T2 signal is present in the left hemi cord. Mild foraminal narrowing is present bilaterally. C6-7: A broad-based disc osteophyte complex effaces the ventral CSF. Moderate foraminal stenosis is present bilaterally. C7-T1: Negative. IMPRESSION: 1. Prominent central disc protrusion at C5-6 with severe central canal stenosis. 2. Increased T2 signal in the left hemi cord at C5-6 compatible with chronic myelomalacia. 3. Mild foraminal narrowing bilaterally at C5-6. 4. Mild central and moderate foraminal stenosis bilaterally at C6-7. 5. Severe right foraminal stenosis at C3-4. Electronically Signed   By: San Morelle M.D.   On: 01/28/2022 19:41     LOS: 15 days   Signature  Lala Lund M.D on 01/30/2022 at 9:58 AM   -  To page go to [email or web address]

## 2022-01-31 DIAGNOSIS — D62 Acute posthemorrhagic anemia: Secondary | ICD-10-CM | POA: Diagnosis not present

## 2022-01-31 DIAGNOSIS — N185 Chronic kidney disease, stage 5: Secondary | ICD-10-CM | POA: Diagnosis not present

## 2022-01-31 DIAGNOSIS — A419 Sepsis, unspecified organism: Secondary | ICD-10-CM | POA: Diagnosis not present

## 2022-01-31 DIAGNOSIS — I10 Essential (primary) hypertension: Secondary | ICD-10-CM | POA: Diagnosis not present

## 2022-01-31 LAB — HEPATIC FUNCTION PANEL
ALT: 16 U/L (ref 0–44)
AST: 21 U/L (ref 15–41)
Albumin: 2 g/dL — ABNORMAL LOW (ref 3.5–5.0)
Alkaline Phosphatase: 87 U/L (ref 38–126)
Bilirubin, Direct: 0.2 mg/dL (ref 0.0–0.2)
Indirect Bilirubin: 0.5 mg/dL (ref 0.3–0.9)
Total Bilirubin: 0.7 mg/dL (ref 0.3–1.2)
Total Protein: 6.7 g/dL (ref 6.5–8.1)

## 2022-01-31 LAB — GLUCOSE, CAPILLARY
Glucose-Capillary: 143 mg/dL — ABNORMAL HIGH (ref 70–99)
Glucose-Capillary: 258 mg/dL — ABNORMAL HIGH (ref 70–99)
Glucose-Capillary: 357 mg/dL — ABNORMAL HIGH (ref 70–99)

## 2022-01-31 MED ORDER — HEPARIN SODIUM (PORCINE) 1000 UNIT/ML IJ SOLN
INTRAMUSCULAR | Status: AC
  Filled 2022-01-31: qty 4

## 2022-01-31 MED ORDER — HYDRALAZINE HCL 20 MG/ML IJ SOLN
10.0000 mg | Freq: Four times a day (QID) | INTRAMUSCULAR | Status: DC | PRN
Start: 2022-01-31 — End: 2022-02-03

## 2022-01-31 MED ORDER — HEPARIN SODIUM (PORCINE) 5000 UNIT/ML IJ SOLN
5000.0000 [IU] | Freq: Three times a day (TID) | INTRAMUSCULAR | Status: DC
  Administered 2022-01-31 – 2022-02-01 (×3): 5000 [IU] via SUBCUTANEOUS
  Filled 2022-01-31 (×3): qty 1

## 2022-01-31 MED ORDER — TRAMADOL HCL 50 MG PO TABS
50.0000 mg | ORAL_TABLET | Freq: Two times a day (BID) | ORAL | Status: DC | PRN
  Administered 2022-01-31 – 2022-02-02 (×3): 50 mg via ORAL
  Filled 2022-01-31 (×3): qty 1

## 2022-01-31 NOTE — Progress Notes (Signed)
Nutrition Follow-up  DOCUMENTATION CODES:   Not applicable  INTERVENTION:   Continue Nepro Shake po BID, each supplement provides 425 kcal and 19 grams protein Continue Renal Multivitamin w/ minerals daily Liberalize pt diet to Carb Modified to help encourage pt PO intake  NUTRITION DIAGNOSIS:   Increased nutrient needs related to chronic illness (Cancer, CHF, ESRD) as evidenced by estimated needs. - Ongoing   GOAL:   Patient will meet greater than or equal to 90% of their needs - Progressing   MONITOR:   PO intake, Supplement acceptance, Labs, Weight trends, I & O's  REASON FOR ASSESSMENT:   Consult Diet education  ASSESSMENT:   66 y.o. male presented to the ED with confusion and hypoglycemia. PMH includes CKD V, HTN, Prostate cancer, T2DM, CHF, and GERD. Pt admitted with sepsis and acute metabolic encephalopathy.  Pt had just returned to the unit from HD. Reports that he is doing "ok" and that he guess HD is going ok. Reports that he thinks he is eating better. Denies any nausea, just in pain. States that he has been drinking the Nepro shakes. Meal Completions: 55-100% x 8 meals (average 85%)  Pt missing one or two meals on dialysis days. Discussed liberalizing diet with pt, pt son, and Therapist, sports. Son and pt appreciative of that to help with encouraging pt to improve.   Medications reviewed and include: Calcitriol, Phoslo, NovoLog, Levemir, Rena-vit, Protonix,  Prednisone, IV Ferric Gluconate  Labs reviewed: 24 hr CBG 143-328  HD on 8/30  Net UF: 3 L Pre- Dialysis Weight: 112.4 kg Post-Dialysis Weight: 109.3 kg  Diet Order:   Diet Order             Diet renal/carb modified with fluid restriction Diet-HS Snack? Nothing; Fluid restriction: 1200 mL Fluid; Room service appropriate? Yes; Fluid consistency: Thin  Diet effective now                   EDUCATION NEEDS:   No education needs have been identified at this time  Skin:  Skin Assessment: Reviewed RN  Assessment  Last BM:  8/28  Height:   Ht Readings from Last 1 Encounters:  01/15/22 '5\' 11"'$  (1.803 m)    Weight:   Wt Readings from Last 1 Encounters:  01/31/22 109.3 kg    Ideal Body Weight:  78.2 kg  BMI:  Body mass index is 33.61 kg/m.  Estimated Nutritional Needs:   Kcal:  2200-2400  Protein:  110-125 grams  Fluid:  UOP + 1L    Hermina Barters RD, LDN Clinical Dietitian See Shea Evans for contact information.

## 2022-01-31 NOTE — Progress Notes (Signed)
PROGRESS NOTE        PATIENT DETAILS Name: [name] Age: 66 y.o. Sex: male Date of Birth: [birth year] Admit Date: 01/15/2022 Admitting Physician Lequita Halt, MD DJT:[id number], Malva Limes., FNP  Brief Summary: Patient is a 66 y.o.  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  Started on IV steroids for gouty arthritis-evaluated by nephrology and started on HD.  Hospital course complicated by A-fib RVR, lower GI bleeding and slow clinical improvement.  See below for further details.    Significant events: 8/14>> admit for confusion/gouty arthritis/progression to ESRD. 8/16>> worsening transaminases/encephalopathy/renal function-renal planning HD. 8/17>> TDC placed-first HD planned. 8/18>> Afib RVR with first HD session-HD cut short. Spontaneously converted to sinus rhythm.Oozing from nontunneled HD cath site.  8/19>> Brief run of RVR.  Tolerated HD without any incident. 8/20>> Worsening encephalopathy-urgent HD, 2 episodes of painless small-volume hematochezia. Hemoglobin dropped to 6.7-given 2 units of PRBC with HD.  8/21>> less encephalopathic-answering simple questions-another episode (third) of hematochezia earlier this morning. 8/24>> intra-articular steroid injections to bilateral knee 8/25>> small-volume hematochezia reoccurred  Significant studies: 8/15>> MRI brain: No acute CVA. 8/15>> renal ultrasound: No hydronephrosis. 8/14>> left knee synovial fluid: WBC 23,835, extracellular sodium urate crystals. 8/15>> right knee synovial fluid: WBC 15,600, intracellular monosodium urate crystals. 8/16>> Echo: EF 55-60% 8/16>> RUQ ultrasound: No major abnormalities noted. 8/28 MRI C Spine - 1. Prominent central disc protrusion at C5-6 with severe  central canal stenosis. 2. Increased T2 signal in the left hemi cord at C5-6 compatible with chronic myelomalacia. 3. Mild foraminal narrowing bilaterally at C5-6. 4. Mild central and moderate foraminal stenosis bilaterally at C6-7. 5. Severe right foraminal stenosis at C3-4.    Significant microbiology data: 8/14>> blood culture: No growth 8/14>> left knee synovial fluid: No growth 8/15>> right knee synovial fluid: No growth  Procedures: 8/14>> left knee arthrocentesis 8/15>> right knee arthrocentesis 8/17>> tunneled HD catheter placement by IR. 8/24>> intra-articular steroid injection to bilateral knee by orthopedics.  Consults: Nephrology. Orthopedics Cardiology Vascular ID GI Neurosurgery   Subjective:  Patient in bed, appears comfortable, denies any headache, no fever, no chest pain or pressure, no shortness of breath , no abdominal pain. No new focal weakness.    Objective: Vitals: Blood pressure (!) 157/55, pulse 69, temperature 98.8 F (37.1 C), temperature source Oral, resp. rate 19, height '5\' 11"'$  (1.803 m), weight 112.4 kg, SpO2 94 %.   Exam:  Awake Alert, No new F.N deficits, chronic left upper extremity grip weakness for several months [place].AT,PERRAL Supple Neck, No JVD,   Symmetrical Chest wall movement, Good air movement bilaterally, CTAB RRR,No Gallops, Rubs or new Murmurs,  +ve B.Sounds, Abd Soft, No tenderness,   No Cyanosis, Clubbing or edema      Assessment/Plan:  SIRS due to acute gouty arthritis involving bilateral knees, numerous joints of left hand: Bilateral knee/left hand arthralgias better-patient underwent intra-articular steroid injection to bilateral knee on 8/24.  He did not have a significant response to IV Solu-Medrol/prednisone.  Suspect that patient not only has acute gouty arthritis but significant amount of OA at baseline as well.  No evidence of infection-blood/sign of blood cultures negative.  Septic  arthritis/sepsis ruled out.   Evaluated by infectious disease during the earlier part of this hospitalization-with recommendations to continue to monitor off antibiotics.  Orthopedics (Dr. Alvan Dame) continues to follow periodically  CKD stage V with progression to ESRD: Encephalopathy better-nephrology following and directing HD care.  Hyperkalemia: Due to worsening renal function-resolved with Lokelma and HD.  Continue to monitor periodically.  Oozing from tunneled HD catheter site: Evaluated by IR on 8/18-bleeding site sutured-no further bleeding evident.  A-fib with RVR: Triggered by uremia/acute illness-now maintaining sinus rhythm on oral beta-blocker-Eliquis on hold due to ongoing lower GI bleeding.  Cardiology has signed off.  Lower GI bleeding: Has had intermittent hematochezia for the past few days-last episode on 8/25.  Did require 2 units of PRBC transfusion on 8/20.  GI consulted but given lingering encephalopathy-severe debility-felt to be too weak to tolerate a colonoscopy prep-with recommendations to reconsult GI when encephalopathy/debility have improved.  GI recommended to hold Eliquis 5 days from 8/22-but since patient had another episode of hematochezia on 8/25-we will plan to hold Eliquis for a few more days.  Thankfully his encephalopathy is better-he has started to participate a bit more with therapy-suspect if this trend continues-we will need to reconsult GI sometime early next week.  In the meantime-we will plan to continue to follow CBC and transfuse accordingly.  Normocytic anemia: Multifactorial etiology-due to acute illness/CKD/acute blood loss anemia due to lower GI bleeding.  Transfused 2 units of PRBC on 8/20-slight drop in hemoglobin today-in the absence of active GI bleeding-this is likely due to equilibration.  Repeat CBC tomorrow morning or sooner if patient has another episode of lower GI bleeding.  We will plan on transfusion if significant drop in hemoglobin or if recurrent hematochezia recurs.     Acute metabolic encephalopathy: Due to AKI/uremia-improving.  Much more awake and alert today.    Transaminitis: Unclear etiology-suspect probably medication related-possibly related to cefepime.  Statin/Zytiga was held-LFTs have improved.  RUQ ultrasound was unremarkable.    Hyponatremia: Related to volume-mild-should improve with further HD episodes.  Chronic left arm weakness: Per patient's son-this has been ongoing for the past several months-son has noted a problem with the patient's grip mostly.  Due to left hand arthralgia/L UE swelling (after superficialization of AV fistula)-this has worsened somewhat.  However over the past few days-with decreasing swelling/arthralgias-he seems to move his left arm more fluently.  MRI brain negative for CVA.  Noted MRI of C-spine, will DW NS.  Family thinking about surgical correction this admission versus few months down the line.  We will be a moderate to high risk candidate for adverse cardiopulmonary outcome during the perioperative period.  Superficialization of left upper extremity AV fistula by vascular surgery on 8/8: With some residual arm swelling that seems to have that had worsened but has since improved over the past few days with elevation.  Appreciate vascular surgery input.  Continue to follow closely.    Prostate cancer: On prednisone/Zytiga chronically.  Zytiga briefly held due to elevated LFTs-has been tapered back to his usual prednisone dosing, recheck LFTs  DM-2 (A1c 9.6 on 8/14) with uncontrolled hyperglycemia due to steroids: CBGs fluctuating quite a bit-now mostly on the higher side-increase Levemir to 26 units twice daily, increase Premeal NovoLog to 8 units.  Follow/optimize.   Will need to permanently stop glipizide and metformin on discharge.  Lab Results  Component Value Date   HGBA1C 9.6 (H) 01/15/2022   CBG (last 3)  Recent Labs    01/30/22  1551 01/30/22 2110 01/31/22 0544  GLUCAP 328* 283* 143*     Morbid  Obesity: Estimated body mass index is 34.56 kg/m as calculated from the following:   Height as of this encounter: '5\' 11"'$  (1.803 m).   Weight as of this encounter: 112.4 kg.   Code status:   Code Status: Full Code   DVT Prophylaxis:Hold Eliquis due to ongoing hematochezia.   Family Communication: Son at bedside on 01/30/2022.   Disposition Plan: Status is: Inpatient Remains inpatient appropriate because: Likely progression to ESRD-nephrology initiating dialysis-remains encephalopathic-continues to have intermittent hematochezia-needs colonoscopy-not yet stable for discharge.   Planned Discharge Destination:SNF  Diet: Diet Order             Diet renal/carb modified with fluid restriction Diet-HS Snack? Nothing; Fluid restriction: 1200 mL Fluid; Room service appropriate? Yes; Fluid consistency: Thin  Diet effective now                   MEDICATIONS: Scheduled Meds:  abiraterone acetate  1,000 mg Oral Daily   calcitRIOL  0.25 mcg Oral Daily   calcium acetate  1,334 mg Oral TID WC   carvedilol  25 mg Oral BID WC   Chlorhexidine Gluconate Cloth  6 each Topical Q0600   colchicine  0.3 mg Oral Daily   feeding supplement (NEPRO CARB STEADY)  237 mL Oral BID BM   insulin aspart  0-9 Units Subcutaneous TID WC   insulin aspart  8 Units Subcutaneous TID WC   insulin detemir  30 Units Subcutaneous BID   multivitamin  1 tablet Oral QHS   pantoprazole  40 mg Oral BID   predniSONE  5 mg Oral Q breakfast   sodium chloride flush  10-40 mL Intracatheter Q12H   Continuous Infusions:  ferric gluconate (FERRLECIT) IVPB    PRN Meds:.acetaminophen, hydrALAZINE, lidocaine (PF), lidocaine-prilocaine, LORazepam, metoprolol tartrate, ondansetron (ZOFRAN) IV, pentafluoroprop-tetrafluoroeth, sodium chloride, traMADol   I have personally reviewed following labs and imaging studies  LABORATORY DATA:  Recent Labs  Lab 01/25/22 0602 01/26/22 0453 01/27/22 0744 01/28/22 0258 01/29/22 0800   WBC 17.0* 19.4* 17.3* 17.0* 16.0*  HGB 8.3* 9.1* 7.9* 8.5* 8.4*  HCT 25.2* 27.5* 25.1* 26.5* 26.4*  PLT 254 278 309 333 335  MCV 86.3 86.2 87.8 88.0 88.9  MCH 28.4 28.5 27.6 28.2 28.3  MCHC 32.9 33.1 31.5 32.1 31.8  RDW 15.4 15.2 15.1 14.9 14.9    Recent Labs  Lab 01/25/22 0602 01/26/22 0453 01/27/22 0745 01/28/22 0258 01/29/22 0800  NA 133* 135 131* 134* 134*  K 3.8 4.4 4.1 3.7 3.8  CL 94* 94* 93* 94* 96*  CO2 '23 23 23 25 25  '$ GLUCOSE 49* 260* 278* 179* 103*  BUN 116* 73* 116* 69* 112*  CREATININE 7.60* 5.81* 7.80* 5.61* 7.28*  CALCIUM 8.3* 8.5* 8.3* 8.2* 8.1*  ALBUMIN 1.6* 1.7* 1.6* 1.7* 1.8*  MG 1.9  --   --   --   --   PHOS 8.0* 7.5* 8.7* 6.4* 8.0*   RADIOLOGY STUDIES/RESULTS: No results found.   LOS: 16 days   Signature  Lala Lund M.D on 01/31/2022 at 8:43 AM   -  To page go to [email or web address]

## 2022-01-31 NOTE — Progress Notes (Signed)
Received patient in bed to unit.  Alert and oriented.  Informed consent signed and in chart.   Treatment initiated: 0903 Treatment completed: 1236  Patient tolerated well.  Transported back to the room  Alert, without acute distress.  Hand-off given to patient's nurse.   Access used: Cath Access issues: none  Total UF removed: 3000 Medication(s) given: Ferrlecit Post HD VS: 144/59,98.1,96%,14 Post HD weight: 109.3kg   [facility]

## 2022-01-31 NOTE — Procedures (Signed)
Patient was seen on dialysis and the procedure was supervised.  BFR 400,  Via TDC BP is  145/59.   Patient appears to be tolerating treatment well. Outpatient HD already arranged, discussed with renal navigator.  [name] Tanna Furry 01/31/2022

## 2022-02-01 ENCOUNTER — Inpatient Hospital Stay (HOSPITAL_COMMUNITY): Payer: Medicare Other

## 2022-02-01 DIAGNOSIS — I10 Essential (primary) hypertension: Secondary | ICD-10-CM | POA: Diagnosis not present

## 2022-02-01 DIAGNOSIS — D62 Acute posthemorrhagic anemia: Secondary | ICD-10-CM | POA: Diagnosis not present

## 2022-02-01 DIAGNOSIS — N185 Chronic kidney disease, stage 5: Secondary | ICD-10-CM | POA: Diagnosis not present

## 2022-02-01 DIAGNOSIS — A419 Sepsis, unspecified organism: Secondary | ICD-10-CM | POA: Diagnosis not present

## 2022-02-01 LAB — CBC WITH DIFFERENTIAL/PLATELET
Abs Immature Granulocytes: 0.1 10*3/uL — ABNORMAL HIGH (ref 0.00–0.07)
Basophils Absolute: 0 10*3/uL (ref 0.0–0.1)
Basophils Relative: 0 %
Eosinophils Absolute: 0 10*3/uL (ref 0.0–0.5)
Eosinophils Relative: 0 %
HCT: 27 % — ABNORMAL LOW (ref 39.0–52.0)
Hemoglobin: 8.9 g/dL — ABNORMAL LOW (ref 13.0–17.0)
Immature Granulocytes: 1 %
Lymphocytes Relative: 9 %
Lymphs Abs: 1.3 10*3/uL (ref 0.7–4.0)
MCH: 28.1 pg (ref 26.0–34.0)
MCHC: 33 g/dL (ref 30.0–36.0)
MCV: 85.2 fL (ref 80.0–100.0)
Monocytes Absolute: 1.3 10*3/uL — ABNORMAL HIGH (ref 0.1–1.0)
Monocytes Relative: 8 %
Neutro Abs: 12.6 10*3/uL — ABNORMAL HIGH (ref 1.7–7.7)
Neutrophils Relative %: 82 %
Platelets: 308 10*3/uL (ref 150–400)
RBC: 3.17 MIL/uL — ABNORMAL LOW (ref 4.22–5.81)
RDW: 14.9 % (ref 11.5–15.5)
WBC: 15.4 10*3/uL — ABNORMAL HIGH (ref 4.0–10.5)
nRBC: 0 % (ref 0.0–0.2)

## 2022-02-01 LAB — GLUCOSE, CAPILLARY
Glucose-Capillary: 354 mg/dL — ABNORMAL HIGH (ref 70–99)
Glucose-Capillary: 283 mg/dL — ABNORMAL HIGH (ref 70–99)
Glucose-Capillary: 190 mg/dL — ABNORMAL HIGH (ref 70–99)
Glucose-Capillary: 56 mg/dL — ABNORMAL LOW (ref 70–99)
Glucose-Capillary: 85 mg/dL (ref 70–99)

## 2022-02-01 LAB — COMPREHENSIVE METABOLIC PANEL
ALT: 15 U/L (ref 0–44)
AST: 20 U/L (ref 15–41)
Albumin: 1.8 g/dL — ABNORMAL LOW (ref 3.5–5.0)
Alkaline Phosphatase: 73 U/L (ref 38–126)
Anion gap: 12 (ref 5–15)
BUN: 80 mg/dL — ABNORMAL HIGH (ref 8–23)
CO2: 28 mmol/L (ref 22–32)
Calcium: 8.8 mg/dL — ABNORMAL LOW (ref 8.9–10.3)
Chloride: 94 mmol/L — ABNORMAL LOW (ref 98–111)
Creatinine, Ser: 5.76 mg/dL — ABNORMAL HIGH (ref 0.61–1.24)
GFR, Estimated: 10 mL/min — ABNORMAL LOW (ref >60)
Glucose, Bld: 268 mg/dL — ABNORMAL HIGH (ref 70–99)
Potassium: 4.6 mmol/L (ref 3.5–5.1)
Sodium: 134 mmol/L — ABNORMAL LOW (ref 135–145)
Total Bilirubin: 0.6 mg/dL (ref 0.3–1.2)
Total Protein: 5.9 g/dL — ABNORMAL LOW (ref 6.5–8.1)

## 2022-02-01 LAB — AMMONIA: Ammonia: 12 umol/L (ref 9–35)

## 2022-02-01 LAB — PHOSPHORUS: Phosphorus: 4.8 mg/dL — ABNORMAL HIGH (ref 2.5–4.6)

## 2022-02-01 MED ORDER — APIXABAN 5 MG PO TABS
5.0000 mg | ORAL_TABLET | Freq: Two times a day (BID) | ORAL | Status: DC
  Administered 2022-02-01 – 2022-02-02 (×3): 5 mg via ORAL
  Filled 2022-02-01 (×3): qty 1

## 2022-02-01 MED ORDER — CHLORHEXIDINE GLUCONATE CLOTH 2 % EX PADS
6.0000 | MEDICATED_PAD | Freq: Every day | CUTANEOUS | Status: DC
  Administered 2022-02-01: 6 via TOPICAL

## 2022-02-01 MED ORDER — INSULIN DETEMIR 100 UNIT/ML ~~LOC~~ SOLN
35.0000 [IU] | Freq: Two times a day (BID) | SUBCUTANEOUS | Status: DC
  Administered 2022-02-02 (×3): 35 [IU] via SUBCUTANEOUS
  Filled 2022-02-01 (×4): qty 0.35

## 2022-02-01 MED ORDER — DARBEPOETIN ALFA 60 MCG/0.3ML IJ SOSY
60.0000 ug | PREFILLED_SYRINGE | INTRAMUSCULAR | Status: DC
  Administered 2022-02-02: 60 ug via INTRAVENOUS
  Filled 2022-02-01: qty 0.3

## 2022-02-01 MED ORDER — INSULIN ASPART 100 UNIT/ML IJ SOLN
0.0000 [IU] | Freq: Three times a day (TID) | INTRAMUSCULAR | Status: DC
  Administered 2022-02-01: 8 [IU] via SUBCUTANEOUS
  Administered 2022-02-01: 3 [IU] via SUBCUTANEOUS

## 2022-02-01 NOTE — TOC Progression Note (Signed)
Transition of Care [facility]) - Progression Note    Patient Details  Name: [name] MRN: [id number] Date of Birth: [birth year]  Transition of Care [facility]) CM/SW [place], [place] Phone Number: 02/01/2022, 1:40 PM  Clinical Narrative:    Biochemist, clinical for [employer] effective through tomorrow at midnight. CSW updated patient's son and he is still in agreement with plan.    Expected Discharge Plan: Skilled Nursing Facility Barriers to Discharge: Ship broker, Continued Medical Work up, Waiting for outpatient dialysis, SNF Pending bed offer  Expected Discharge Plan and Services Expected Discharge Plan: [place] In-house Referral: Clinical Social Work   Post Acute Care Choice: [place] Living arrangements for the past 2 months: Single Family Home                                       Social Determinants of Health (SDOH) Interventions    Readmission Risk Interventions     No data to display

## 2022-02-01 NOTE — Progress Notes (Signed)
PROGRESS NOTE        PATIENT DETAILS Name: [name] Age: 66 y.o. Sex: male Date of Birth: 02-16-1956 Admit Date: 01/15/2022 Admitting Physician Lequita Halt, MD [id number], Malva Limes., FNP  Brief Summary: Patient is a 66 y.o.  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  Started on IV steroids for gouty arthritis-evaluated by nephrology and started on HD.  Hospital course complicated by A-fib RVR, lower GI bleeding and slow clinical improvement.  See below for further details.    Significant events: 8/14>> admit for confusion/gouty arthritis/progression to ESRD. 8/16>> worsening transaminases/encephalopathy/renal function-renal planning HD. 8/17>> TDC placed-first HD planned. 8/18>> Afib RVR with first HD session-HD cut short. Spontaneously converted to sinus rhythm.Oozing from nontunneled HD cath site.  8/19>> Brief run of RVR.  Tolerated HD without any incident. 8/20>> Worsening encephalopathy-urgent HD, 2 episodes of painless small-volume hematochezia. Hemoglobin dropped to 6.7-given 2 units of PRBC with HD.  8/21>> less encephalopathic-answering simple questions-another episode (third) of hematochezia earlier this morning. 8/24>> intra-articular steroid injections to bilateral knee 8/25>> small-volume hematochezia reoccurred  Significant studies: 8/15>> MRI brain: No acute CVA. 8/15>> renal ultrasound: No hydronephrosis. 8/14>> left knee synovial fluid: WBC 23,835, extracellular sodium urate crystals. 8/15>> right knee synovial fluid: WBC 15,600, intracellular monosodium urate crystals. 8/16>> Echo: EF 55-60% 8/16>> RUQ ultrasound: No major abnormalities noted. 8/28 MRI C Spine - 1. Prominent central disc protrusion at C5-6 with severe  central canal stenosis. 2. Increased T2 signal in the left hemi cord at C5-6 compatible with chronic myelomalacia. 3. Mild foraminal narrowing bilaterally at C5-6. 4. Mild central and moderate foraminal stenosis bilaterally at C6-7. 5. Severe right foraminal stenosis at C3-4.    Significant microbiology data: 8/14>> blood culture: No growth 8/14>> left knee synovial fluid: No growth 8/15>> right knee synovial fluid: No growth  Procedures: 8/14>> left knee arthrocentesis 8/15>> right knee arthrocentesis 8/17>> tunneled HD catheter placement by IR. 8/24>> intra-articular steroid injection to bilateral knee by orthopedics.  Consults: Nephrology. Orthopedics Cardiology Vascular ID GI Neurosurgery   Subjective:  Patient in bed, appears comfortable, denies any headache, no fever, no chest pain or pressure, no shortness of breath , no abdominal pain. No new focal weakness.   Objective: Vitals: Blood pressure 114/65, pulse 88, temperature 98.6 F (37 C), temperature source Oral, resp. rate 13, height '5\' 11"'$  (1.803 m), weight 116.6 kg, SpO2 97 %.   Exam:  Awake Alert, No new F.N deficits, Normal affect [place].AT,PERRAL Supple Neck, No JVD,   Symmetrical Chest wall movement, Good air movement bilaterally, CTAB RRR,No Gallops, Rubs or new Murmurs,  +ve B.Sounds, Abd Soft, No tenderness,   No Cyanosis, Clubbing or edema     Assessment/Plan:  SIRS due to acute gouty arthritis involving bilateral knees, numerous joints of left hand: Bilateral knee/left hand arthralgias better-patient underwent intra-articular steroid injection to bilateral knee on 8/24.  He did not have a significant response to IV Solu-Medrol/prednisone.  Suspect that patient not only has acute gouty arthritis but significant amount of OA at baseline as well.  No evidence of infection-blood/sign of blood cultures negative.  Septic arthritis/sepsis ruled out.  Evaluated by infectious disease during the  earlier part of  this hospitalization-with recommendations to continue to monitor off antibiotics.  Orthopedics (Dr. Alvan Dame) continues to follow periodically  CKD stage V with progression to ESRD: Encephalopathy better-nephrology following and directing HD care.  Hyperkalemia: Due to worsening renal function-resolved with Lokelma and HD.  Continue to monitor periodically.  Oozing from tunneled HD catheter site: Evaluated by IR on 8/18-bleeding site sutured-no further bleeding evident.  A-fib with RVR: Triggered by uremia/acute illness-now maintaining sinus rhythm on oral beta-blocker-Eliquis on hold due to ongoing lower GI bleeding.  Cardiology has signed off.  Lower GI bleeding: Has had intermittent hematochezia for the past few days-last episode on 8/25.  Did require 2 units of PRBC transfusion on 8/20.  GI consulted but given lingering encephalopathy-severe debility-felt to be too weak to tolerate a colonoscopy prep-with recommendations to reconsult GI when encephalopathy/debility have improved.  GI recommended to hold Eliquis 5 days from 8/22-had another episode of hematochezia after that, now no further bleeding now commence Eliquis with caution on 02/01/2022.  Normocytic anemia: Multifactorial etiology-due to acute illness/CKD/acute blood loss anemia due to lower GI bleeding.  Transfused 2 units of PRBC on 8/20-slight drop in hemoglobin today-CBC remains stable will restart anticoagulation cautiously on 02/01/2022 and monitor.  Acute metabolic encephalopathy: Due to AKI/uremia-improving.  Much more awake and alert today.    Transaminitis: Unclear etiology-suspect probably medication related-possibly related to cefepime.  Statin/Zytiga was held-LFTs have improved.  RUQ ultrasound was unremarkable.    Hyponatremia: Related to volume-mild-should improve with further HD episodes.  Chronic left arm weakness: Per patient's son-this has been ongoing for the past several months-son has noted a problem with the  patient's grip mostly.  Due to left hand arthralgia/L UE swelling (after superficialization of AV fistula)-this has worsened somewhat.  However over the past few days-with decreasing swelling/arthralgias-he seems to move his left arm more fluently.  MRI brain negative for CVA.  Noted MRI of C-spine, will DW NS.  Family thinking about surgical correction this admission versus few months down the line.  We will be a moderate to high risk candidate for adverse cardiopulmonary outcome during the perioperative period.  Superficialization of left upper extremity AV fistula by vascular surgery on 8/8: With some residual arm swelling that seems to have that had worsened but has since improved over the past few days with elevation.  Appreciate vascular surgery input.  Continue to follow closely.    Prostate cancer: On prednisone/Zytiga chronically.  Zytiga briefly held due to elevated LFTs-has been tapered back to his usual prednisone dosing, stable LFTs now.  DM-2 (A1c 9.6 on 8/14) with uncontrolled hyperglycemia due to steroids: CBGs fluctuating quite a bit-insulin dose adjusted further on 02/01/2022 will monitor closely  Will need to permanently stop glipizide and metformin on discharge.  Lab Results  Component Value Date   HGBA1C 9.6 (H) 01/15/2022   CBG (last 3)  Recent Labs    01/31/22 1616 01/31/22 2112 02/01/22 0828  GLUCAP 258* 357* 354*     Morbid Obesity: Estimated body mass index is 35.84 kg/m as calculated from the following:   Height as of this encounter: '5\' 11"'$  (1.803 m).   Weight as of this encounter: 116.6 kg.   Code status:   Code Status: Full Code   DVT Prophylaxis:Hold Eliquis due to ongoing hematochezia. apixaban (ELIQUIS) tablet 5 mg   Family Communication: Son at bedside on 01/30/2022.   Disposition Plan: Status is: Inpatient Remains inpatient appropriate because: Likely progression to ESRD-nephrology initiating dialysis-remains encephalopathic-continues to  have  intermittent hematochezia-needs colonoscopy-not yet stable for discharge.   Planned Discharge Destination:SNF  Diet: Diet Order             Diet Carb Modified Fluid consistency: Thin; Room service appropriate? Yes with Assist; Fluid restriction: 1200 mL Fluid  Diet effective now                   MEDICATIONS: Scheduled Meds:  abiraterone acetate  1,000 mg Oral Daily   apixaban  5 mg Oral BID   calcitRIOL  0.25 mcg Oral Daily   calcium acetate  1,334 mg Oral TID WC   carvedilol  25 mg Oral BID WC   Chlorhexidine Gluconate Cloth  6 each Topical Q0600   colchicine  0.3 mg Oral Daily   feeding supplement (NEPRO CARB STEADY)  237 mL Oral BID BM   insulin aspart  0-15 Units Subcutaneous TID WC   insulin aspart  8 Units Subcutaneous TID WC   insulin detemir  35 Units Subcutaneous BID   multivitamin  1 tablet Oral QHS   pantoprazole  40 mg Oral BID   predniSONE  5 mg Oral Q breakfast   sodium chloride flush  10-40 mL Intracatheter Q12H   Continuous Infusions:  ferric gluconate (FERRLECIT) IVPB Stopped (01/31/22 1249)  PRN Meds:.acetaminophen, hydrALAZINE, lidocaine (PF), lidocaine-prilocaine, LORazepam, metoprolol tartrate, ondansetron (ZOFRAN) IV, pentafluoroprop-tetrafluoroeth, sodium chloride, traMADol   I have personally reviewed following labs and imaging studies  LABORATORY DATA:  Recent Labs  Lab 01/26/22 0453 01/27/22 0744 01/28/22 0258 01/29/22 0800 02/01/22 0524  WBC 19.4* 17.3* 17.0* 16.0* 15.4*  HGB 9.1* 7.9* 8.5* 8.4* 8.9*  HCT 27.5* 25.1* 26.5* 26.4* 27.0*  PLT 278 309 333 335 308  MCV 86.2 87.8 88.0 88.9 85.2  MCH 28.5 27.6 28.2 28.3 28.1  MCHC 33.1 31.5 32.1 31.8 33.0  RDW 15.2 15.1 14.9 14.9 14.9  LYMPHSABS  --   --   --   --  1.3  MONOABS  --   --   --   --  1.3*  EOSABS  --   --   --   --  0.0  BASOSABS  --   --   --   --  0.0    Recent Labs  Lab 01/26/22 0453 01/27/22 0745 01/28/22 0258 01/29/22 0800 01/31/22 1407 02/01/22 0524  NA  135 131* 134* 134*  --  134*  K 4.4 4.1 3.7 3.8  --  4.6  CL 94* 93* 94* 96*  --  94*  CO2 '23 23 25 25  '$ --  28  GLUCOSE 260* 278* 179* 103*  --  268*  BUN 73* 116* 69* 112*  --  80*  CREATININE 5.81* 7.80* 5.61* 7.28*  --  5.76*  CALCIUM 8.5* 8.3* 8.2* 8.1*  --  8.8*  AST  --   --   --   --  21 20  ALT  --   --   --   --  16 15  ALKPHOS  --   --   --   --  87 73  BILITOT  --   --   --   --  0.7 0.6  ALBUMIN 1.7* 1.6* 1.7* 1.8* 2.0* 1.8*  PHOS 7.5* 8.7* 6.4* 8.0*  --   --    RADIOLOGY STUDIES/RESULTS: No results found.   LOS: 17 days   Signature  Lala Lund M.D on 02/01/2022 at 10:23 AM   -  To page go to  [email or web address]

## 2022-02-01 NOTE — Progress Notes (Signed)
Occupational Therapy Treatment Patient Details Name: [name] MRN: [id number] DOB: 28-Nov-1955 Today's Date: 02/01/2022   History of present illness 66 yo male presents to ED on 8/14 with hypoglycemia, AMS, recent fall, POD6 L AV fistula placement. CTH negative, FOBT +. s/p R and L knee aspiration on 8/14 and 8/15, respectively. Workup for SIRS due to acute gouty arthritis bilat knees. Bilateral knee/left hand arthralgias better-patient underwent intra-articular steroid injection to bilateral knee on 8/24.  He did not have a significant response to IV Solu-Medrol/prednisone, septic arthritis ruled out. C-Spine MRI showing prominent central disc protrusion at C5-6 with severe central canal stenosis. Increased T2 signal in the left hemi cord at C5-6 compatible with chronic myelomalacia. Mild foraminal narrowing and central and moderate foraminal stenosis bilaterally at C5 and 6-7. Severe right foraminal stenosis at C3-4. PMH includes CKD stage V, DM-2 with retinopathy and neuropathy, chronic HFpEF, HTN, HLD, prostate cancer, cardiomyopathy.   OT comments  Pt supine in bed on OT/PTA arrival and agreeable to therapy session. Pt with continued reports of pain in bilateral knees and calling out with pain during all mobility. Pt requiring min cues for engagement in coping strategies for management of pain this session and with improved affect when engaged in coping strategies. Performing LB and UB therapeutic exercises during session of knee flexion, and UE AAROM in preparation for participation in ADL. Pt performing rolling with max A +2 and maintaining long sit in bed with max A +2. Continue to recommend SNF for OT services to optimize safety and independence in ADL and IADL.    Recommendations for follow up therapy are one component of a multi-disciplinary discharge planning process, led by the attending physician.  Recommendations may be updated based on patient status, additional functional criteria and  insurance authorization.    Follow Up Recommendations  Skilled nursing-short term rehab (<3 hours/day)    Assistance Recommended at Discharge Frequent or constant Supervision/Assistance  Patient can return home with the following  Two people to help with bathing/dressing/bathroom;Assistance with cooking/housework;Direct supervision/assist for medications management;Direct supervision/assist for financial management;Assist for transportation;Help with stairs or ramp for entrance;Two people to help with walking and/or transfers   Equipment Recommendations  Other (comment) (TBD)    Recommendations for Other Services      Precautions / Restrictions Precautions Precautions: Fall Precaution Comments: R IJ catheter, LUE edema/pain, severe B knee pain Restrictions Weight Bearing Restrictions: No       Mobility Bed Mobility Overal bed mobility: Needs Assistance Bed Mobility: Rolling, Supine to Sit Rolling: Max assist, +2 for physical assistance   Supine to sit: Max assist, +2 for physical assistance, +2 for safety/equipment, HOB elevated     General bed mobility comments: Requiring max A to roll to achieve optimal positioning in bed. Requiring cues for hand placement and body mechanics throughout. Performing supine to long wit with HOB elevated with max assist +2. Pt actively pulling trunk with RUE with step by step direct verbal cues. Pt with good initiation, but requiring support to maintain. Pt demonstrating pain decreased activity tolerance throughout.    Transfers                   General transfer comment: Pt not moving toward EOB this session due to increased pain. Pt performing lateral scoot to reposition hips in bed with max A+3 this session.     Balance Overall balance assessment: Needs assistance     Sitting balance - Comments: Max A to maintain long sit  in bed. Postural control: Posterior lean                                 ADL either performed  or assessed with clinical judgement   ADL Overall ADL's : Needs assistance/impaired                 Upper Body Dressing : Maximal assistance;Bed level   Lower Body Dressing: Total assistance;+2 for physical assistance;+2 for safety/equipment;Bed level Lower Body Dressing Details (indicate cue type and reason): to don socks               General ADL Comments: ADL participation impacted by increased pain, decreased activity tolerance and impaired strength/endurance    Extremity/Trunk Assessment Upper Extremity Assessment Upper Extremity Assessment: Generalized weakness;LUE deficits/detail;RUE deficits/detail RUE Deficits / Details: decreased strength LUE Deficits / Details: increased swelling in L hand , impaired grip strength, issues pt squeeze ball for edema mgmt LUE: Unable to fully assess due to pain LUE Sensation: decreased light touch LUE Coordination: decreased fine motor;decreased gross motor   Lower Extremity Assessment Lower Extremity Assessment: Defer to PT evaluation        Vision   Vision Assessment?: No apparent visual deficits Additional Comments: WFL for tasks assessed. Wearing glasses.   Perception Perception Perception: Within Functional Limits   Praxis Praxis Praxis: Intact    Cognition Arousal/Alertness: Awake/alert Behavior During Therapy: WFL for tasks assessed/performed Overall Cognitive Status: Impaired/Different from baseline Area of Impairment: Safety/judgement, Problem solving, Following commands                     Memory: Decreased short-term memory Following Commands: Follows one step commands with increased time Safety/Judgement: Decreased awareness of deficits Awareness: Emergent Problem Solving: Slow processing, Requires verbal cues, Requires tactile cues, Decreased initiation General Comments: slow processing and initiation of tasks, requiring up to max encouragement. Pt with decreased functional collaboration with  therapists to express needs for support during session. pt requiring cues for problem solving for all mobility. Pt fearful with anticipation of pain, but cooperative with encouragement.        Exercises Exercises: General Upper Extremity General Exercises - Upper Extremity Shoulder Flexion: Left, AAROM, 5 reps, Supine Elbow Flexion: AAROM, Left, 10 reps, Supine Elbow Extension: AAROM, Left, 5 reps (Decreased ability to achieve full extension, noting tightness in biceps) Wrist Flexion: AAROM, 10 reps, Left, Supine Wrist Extension: AAROM, 10 reps, Supine Digit Composite Flexion: AAROM, 10 reps, Left, Supine Composite Extension: AAROM, 10 reps, Left, Supine Other Exercises Other Exercises: Scapular elevation, depression, upward rotation, protraction and retractionx10 Other Exercises: Long sit ~1 minute with max+2 support    Shoulder Instructions       General Comments BP taken supine 158/63(88), in chair position 173/62(89), and again in supine 165/55(78)    Pertinent Vitals/ Pain       Pain Assessment Pain Assessment: PAINAD Breathing: occasional labored breathing, short period of hyperventilation Negative Vocalization: repeated troubled calling out, loud moaning/groaning, crying Facial Expression: facial grimacing Body Language: tense, distressed pacing, fidgeting Consolability: distracted or reassured by voice/touch PAINAD Score: 7 Pain Location: B knees, L > R Pain Descriptors / Indicators: Sore, Discomfort, Grimacing, Guarding, Moaning Pain Intervention(s): Limited activity within patient's tolerance, Monitored during session, Repositioned, Patient requesting pain meds-RN notified, RN gave pain meds during session, Relaxation, Utilized relaxation techniques  Home Living  Prior Functioning/Environment              Frequency  Min 2X/week        Progress Toward Goals  OT Goals(current goals can now be  found in the care plan section)  Progress towards OT goals: Progressing toward goals (slow progression, pain limited)  Acute Rehab OT Goals OT Goal Formulation: With patient Time For Goal Achievement: 02/06/22 Potential to Achieve Goals: Fair ADL Goals Pt Will Perform Grooming: with set-up;sitting Pt Will Perform Upper Body Bathing: with min assist;sitting Pt Will Perform Upper Body Dressing: with min assist;sitting Pt Will Transfer to Toilet: with mod assist;stand pivot transfer;bedside commode Pt/caregiver will Perform Home Exercise Program: Left upper extremity;Increased ROM;Increased strength;With minimal assist Additional ADL Goal #1: Pt will perform bed mobility with min assist in preparation for ADLs. Additional ADL Goal #2: Pt will follow two step commands with 75% accuracy.  Plan Discharge plan remains appropriate;Frequency remains appropriate    Co-evaluation    PT/OT/SLP Co-Evaluation/Treatment: Yes Reason for Co-Treatment: Complexity of the patient's impairments (multi-system involvement);For patient/therapist safety PT goals addressed during session: Mobility/safety with mobility;Strengthening/ROM OT goals addressed during session: ADL's and self-care;Strengthening/ROM      AM-PAC OT "6 Clicks" Daily Activity     Outcome Measure   Help from another person eating meals?: A Little Help from another person taking care of personal grooming?: A Little Help from another person toileting, which includes using toliet, bedpan, or urinal?: Total Help from another person bathing (including washing, rinsing, drying)?: A Lot Help from another person to put on and taking off regular upper body clothing?: A Lot Help from another person to put on and taking off regular lower body clothing?: Total 6 Click Score: 12    End of Session    OT Visit Diagnosis: Pain;Muscle weakness (generalized) (M62.81);Other symptoms and signs involving cognitive function Pain - Right/Left:  Left Pain - part of body:  (B knees, arm)   Activity Tolerance Patient limited by pain   Patient Left in bed;with call bell/phone within reach;with bed alarm set;with family/visitor present   Nurse Communication Mobility status;Other (comment) (continued pain)        Time: 6837-2902 OT Time Calculation (min): 48 min  Charges: OT General Charges $OT Visit: 1 Visit OT Treatments $Therapeutic Activity: 8-22 mins $Therapeutic Exercise: 8-22 mins  Shanda Howells, OTR/L [facility] Acute Rehabilitation Office: [phone]   MVVKPQ A ESLPNPY 02/01/2022, 1:32 PM

## 2022-02-01 NOTE — Progress Notes (Signed)
ANTICOAGULATION CONSULT NOTE - Consult  Pharmacy Consult for Elqiuis Indication: atrial fibrillation  No Known Allergies  Patient Measurements: Height: '5\' 11"'$  (180.3 cm) Weight: 116.6 kg (257 lb) IBW/kg (Calculated) : 75.3  Vital Signs: Temp: 99 F (37.2 C) (08/31 1202) Temp Source: Oral (08/31 1202) BP: 165/55 (08/31 1202) Pulse Rate: 76 (08/31 1202)  Labs: Recent Labs    02/01/22 0524  HGB 8.9*  HCT 27.0*  PLT 308  CREATININE 5.76*    Estimated Creatinine Clearance: 15.9 mL/min (A) (by C-G formula based on SCr of 5.76 mg/dL (H)).  Assessment:   66 yr old male, started Eliquis on 01/20/22 for atrial fibrillation.  Stopped on 8/20 due to hematochezia and Hgb drop. Transfused 8/20. Protonix 40 mg BID added. Felt unable to tolerate colonoscopy prep, and anticoagulation was to be held for 5 days from 8/22 per GI recommendation. Resuming postponed due to another episode of hematochezia on 8/25. Hgb stable, no further episodes noted. Evaluated for possible cervical disc surgery, but has declined. SQ heparin begun 8/30, last dose ~6am today. To transition to Eliquis today.   Goal of Therapy:  Appropriate Eliquis regimen for indication Monitor platelets by anticoagulation protocol: Yes   Plan:  Resume Eliquis 5 mg PO BID. Monitor for signs/symptoms of bleeding.  Arty Baumgartner, RPh 02/01/2022,12:56 PM

## 2022-02-01 NOTE — Progress Notes (Signed)
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 S/p HD yesterday with 3 L ultrafiltration.  Clinically stable.  Plan for regular HD tomorrow.  #Hyperkalemia, hyponatremia: Now managing with dialysis.  #Hypertension/volume: On carvedilol, UF during HD.  #Anemia of CKD and ABLA due to hematochezia: Plan for GI procedure when he is stable.  Iron saturation only 7%, IV iron with dialysis and ESA.  #CKD-MBD, hyperphosphatemia: On calcitriol, started calcium acetate with meals to manage hyperphosphatemia.  Monitor lab.  #Acute metabolic/uremic encephalopathy: Gradually improving.  #A-fib with RVR initially required diltiazem drip now on Coreg, Eliquis on hold because of GI bleed.  Subjective: Seen and examined.  Remains a stable and has been tolerating dialysis well.  No new event today.  His son was present at the bedside.  Objective Vital signs in last 24 hours: Vitals:   01/31/22 2000 02/01/22 0306 02/01/22 0400 02/01/22 0826  BP: (!) 132/55 (!) 156/74 (!) 161/62 114/65  Pulse: 85 70 74 88  Resp: '20 20 15 13  '$ Temp: 98 F (36.7 C) 98.1 F (36.7 C)  98.6 F (37 C)  TempSrc: Oral Oral  Oral  SpO2: 95% 98% 96% 97%  Weight:   116.6 kg   Height:       Weight change: -9.164 kg  Intake/Output Summary (Last 24 hours) at 02/01/2022 1029 Last data filed at 02/01/2022 0600 Gross per 24 hour  Intake 339.47 ml  Output 3 ml  Net 336.47 ml        Labs: RENAL PANEL Recent Labs    01/15/22 1323 01/16/22 0410 01/17/22 0441 01/17/22 1005 01/18/22 0339 01/18/22 1042 01/20/22 0820 01/21/22 0258 01/22/22 0405 01/23/22 0450 01/24/22 8338 01/25/22 0602 01/26/22 0453 01/27/22 0745 01/28/22 0258 01/29/22 0800 01/31/22 1407 02/01/22 0524  NA  --    < > 132* 129* 129*   < > 133*  130* 135 133* 135 133* 135 131* 134* 134*  --  134*  K  --    < > 5.8* 5.4* 4.8   < > 4.2 3.9 4.0 4.2 3.7 3.8 4.4 4.1 3.7 3.8  --  4.6  CL  --    < > 95* 93* 93*   < > 95* 92* 95* 92* 95* 94* 94* 93* 94* 96*  --  94*  CO2  --    < > 18* 16* 17*   < > 19* '22 22 22 30 23 23 23 25 25  '$ --  28  GLUCOSE  --    < > 367* 360* 360*   < > 159* 233* 181* 304* 72 49* 260* 278* 179* 103*  --  268*  BUN  --    < > 145* 149* 170*   < > 177* 140* 110* 140* 95* 116* 73* 116* 69* 112*  --  80*  CREATININE  --    < > 8.49* 8.64* 9.54*   < > 9.55* 8.00* 6.83* 7.77* 6.35* 7.60* 5.81* 7.80* 5.61* 7.28*  --  5.76*  CALCIUM  --    < > 7.4* 7.5* 7.1*   < > 7.9* 7.8* 8.1* 8.5* 8.2* 8.3* 8.5* 8.3* 8.2* 8.1*  --  8.8*  MG 1.9  --  2.4  --   --   --   --   --   --   --   --  1.9  --   --   --   --   --   --   PHOS 4.9*  --  9.7* 9.7* 10.1*  --  10.2*  --  7.7*  --  6.1* 8.0* 7.5* 8.7* 6.4* 8.0*  --   --   ALBUMIN  --   --  1.7* 1.6* 1.5*   < > 1.6* 1.6* 1.6* 1.7* 1.6* 1.6* 1.7* 1.6* 1.7* 1.8* 2.0* 1.8*   < > = values in this interval not displayed.      Liver Function Tests: Recent Labs  Lab 01/29/22 0800 01/31/22 1407 02/01/22 0524  AST  --  21 20  ALT  --  16 15  ALKPHOS  --  87 73  BILITOT  --  0.7 0.6  PROT  --  6.7 5.9*  ALBUMIN 1.8* 2.0* 1.8*    No results for input(s): "LIPASE", "AMYLASE" in the last 168 hours. No results for input(s): "AMMONIA" in the last 168 hours. CBC: Recent Labs    01/15/22 1323 01/16/22 0410 01/26/22 0453 01/27/22 0744 01/28/22 0258 01/29/22 0800 01/30/22 0335 02/01/22 0524  HGB  --    < > 9.1* 7.9* 8.5* 8.4*  --  8.9*  MCV  --    < > 86.2 87.8 88.0 88.9  --  85.2  FERRITIN 381*  --   --   --   --   --  338*  --   TIBC 171*  --   --   --   --   --  279  --   IRON 13*  --   --   --   --   --  20*  --    < > = values in this interval not displayed.     Cardiac Enzymes: No results for input(s): "CKTOTAL", "CKMB", "CKMBINDEX", "TROPONINI" in the last 168  hours. CBG: Recent Labs  Lab 01/30/22 2110 01/31/22 0544 01/31/22 1616 01/31/22 2112 02/01/22 0828  GLUCAP 283* 143* 258* 357* 354*     Iron Studies:  Recent Labs    01/30/22 0335  IRON 20*  TIBC 279  FERRITIN 338*    Studies/Results: No results found.  Medications: Infusions:  ferric gluconate (FERRLECIT) IVPB Stopped (01/31/22 1249)    Scheduled Medications:  abiraterone acetate  1,000 mg Oral Daily   apixaban  5 mg Oral BID   calcitRIOL  0.25 mcg Oral Daily   calcium acetate  1,334 mg Oral TID WC   carvedilol  25 mg Oral BID WC   Chlorhexidine Gluconate Cloth  6 each Topical Q0600   colchicine  0.3 mg Oral Daily   feeding supplement (NEPRO CARB STEADY)  237 mL Oral BID BM   insulin aspart  0-15 Units Subcutaneous TID WC   insulin aspart  8 Units Subcutaneous TID WC   insulin detemir  35 Units Subcutaneous BID   multivitamin  1 tablet Oral QHS   pantoprazole  40 mg Oral BID   predniSONE  5 mg Oral Q breakfast   sodium chloride flush  10-40 mL Intracatheter Q12H    have reviewed scheduled and prn medications.  Physical Exam: General: Alert awake and comfortable, not in distress Heart:RRR, s1s2 nl Lungs:clear b/l, no crackle Abdomen:soft, Non-tender, non-distended Extremities:No leg edema Dialysis Access: Right IJ TDC catheter, left upper extremity AV fistula T/B+, the arm is swollen.  Dace Denn Prasad Jencarlo Bonadonna 02/01/2022,10:29 AM  LOS: 17 days

## 2022-02-01 NOTE — Progress Notes (Signed)
Physical Therapy Treatment Patient Details Name: [name] MRN: [id number] DOB: [birth year] Today's Date: 02/01/2022   History of Present Illness 66 yo male presents to ED on 8/14 with hypoglycemia, AMS, recent fall, POD6 L AV fistula placement. CTH negative, FOBT +. s/p R and L knee aspiration on 8/14 and 8/15, respectively. Workup for SIRS due to acute gouty arthritis bilat knees. Bilateral knee/left hand arthralgias better-patient underwent intra-articular steroid injection to bilateral knee on 8/24.  He did not have a significant response to IV Solu-Medrol/prednisone, septic arthritis ruled out. C-Spine MRI showing prominent central disc protrusion at C5-6 with severe central canal stenosis. Increased T2 signal in the left hemi cord at C5-6 compatible with chronic myelomalacia. Mild foraminal narrowing and central and moderate foraminal stenosis bilaterally at C5 and 6-7. Severe right foraminal stenosis at C3-4. PMH includes CKD stage V, DM-2 with retinopathy and neuropathy, chronic HFpEF, HTN, HLD, prostate cancer, cardiomyopathy.    PT Comments    Pt received in supine, agreeable to therapy session and with fair participation and tolerance for supine UE/LE exercises and bed mobility. Pt remains very limited due to severe L knee pain with minimal mobility and unable to tolerate transfer fully to edge of bed. Pt performed rolling in air bed and long sitting with +2 maxA. VSS on RA other than elevated BP (likely due to pain). Currently pt would need mechanical lift with pad for transfer OOB to chair. Pt continues to benefit from PT services to progress toward functional mobility goals.    Recommendations for follow up therapy are one component of a multi-disciplinary discharge planning process, led by the attending physician.  Recommendations may be updated based on patient status, additional functional criteria and insurance authorization.  Follow Up Recommendations  Skilled nursing-short term  rehab (<3 hours/day) Can patient physically be transported by private vehicle: No   Assistance Recommended at Discharge Frequent or constant Supervision/Assistance  Patient can return home with the following Two people to help with walking and/or transfers;A lot of help with bathing/dressing/bathroom;Assistance with cooking/housework;Help with stairs or ramp for entrance;Assist for transportation   Equipment Recommendations  Other (comment) (TBD, currently would need mechanical lift and hospital bed)    Recommendations for Other Services       Precautions / Restrictions Precautions Precautions: Fall Precaution Comments: R IJ catheter, LUE edema/pain, severe B knee pain Restrictions Weight Bearing Restrictions: No     Mobility  Bed Mobility Overal bed mobility: Needs Assistance Bed Mobility: Rolling, Supine to Sit Rolling: Max assist, +2 for physical assistance   Supine to sit: Max assist, +2 for physical assistance, +2 for safety/equipment, HOB elevated     General bed mobility comments: Requiring max A to roll to achieve optimal positioning in bed. Requiring cues for hand placement and body mechanics throughout. Performing supine to long with HOB elevated with max assist +2. Pt actively pulling trunk with RUE with step by step direct verbal cues. Pt with good initiation, but requiring support to maintain. Pt demonstrating pain decreased activity tolerance throughout. Attempted edge of bed as well however pt L knee pain too severe and pt crying out in pain, he defers edge of bed after pivoting legs close to the edge.    Transfers                   General transfer comment: Pt not moving toward EOB this session due to increased pain. Pt performing lateral scoot to reposition hips in bed with max A+3 this  session.    Ambulation/Gait                   Stairs             Wheelchair Mobility    Modified Rankin (Stroke Patients Only)       Balance  Overall balance assessment: Needs assistance     Sitting balance - Comments: MaxA +2 to maintain long sit in bed with RUE and bed rail Postural control: Posterior lean                                  Cognition Arousal/Alertness: Awake/alert Behavior During Therapy: WFL for tasks assessed/performed Overall Cognitive Status: Impaired/Different from baseline Area of Impairment: Safety/judgement, Problem solving, Following commands                     Memory: Decreased short-term memory Following Commands: Follows one step commands with increased time Safety/Judgement: Decreased awareness of deficits Awareness: Emergent Problem Solving: Slow processing, Requires verbal cues, Requires tactile cues, Decreased initiation General Comments: slow processing and initiation of tasks, requiring up to max encouragement. Pt with decreased functional collaboration with therapists to express needs for support during session. pt requiring cues for problem solving for all mobility. Pt fearful with anticipation of pain, but cooperative with encouragement.        Exercises General Exercises - Lower Extremity Ankle Circles/Pumps: AROM, Both, 10 reps, Supine Quad Sets: AAROM, 5 reps, Supine (tactile cues for improved technique) Gluteal Sets:  (cues to perform throughout day) Heel Slides: AAROM, Both, Supine, 10 reps (in pain-free range, cues for AA with flat sheet rolled up to self-assist) Hip ABduction/ADduction: AAROM, Both, 5 reps, Supine (pain limiting ROM) Other Exercises Other Exercises: Long sit ~1 minute with max+2 support    General Comments General comments (skin integrity, edema, etc.): BP taken supine 158/63(88), in chair position 173/62(89), and again in supine 165/55(78); HR 70's bpm, SpO2 99-100% on RA      Pertinent Vitals/Pain Pain Assessment Pain Assessment: PAINAD Breathing: occasional labored breathing, short period of hyperventilation Negative  Vocalization: repeated troubled calling out, loud moaning/groaning, crying Facial Expression: facial grimacing Body Language: tense, distressed pacing, fidgeting Consolability: distracted or reassured by voice/touch PAINAD Score: 7 Pain Location: B knees, L > R Pain Descriptors / Indicators: Sore, Discomfort, Grimacing, Guarding, Moaning Pain Intervention(s): Limited activity within patient's tolerance, Monitored during session, Repositioned, RN gave pain meds during session (RN gave tylenol; pt defers ice packs)     PT Goals (current goals can now be found in the care plan section) Acute Rehab PT Goals Patient Stated Goal: return to PLOF PT Goal Formulation: With patient/family Time For Goal Achievement: 01/31/22 Progress towards PT goals: Progressing toward goals    Frequency    Min 2X/week      PT Plan Current plan remains appropriate    Co-evaluation PT/OT/SLP Co-Evaluation/Treatment: Yes Reason for Co-Treatment: Complexity of the patient's impairments (multi-system involvement);For patient/therapist safety PT goals addressed during session: Mobility/safety with mobility;Balance;Strengthening/ROM OT goals addressed during session: ADL's and self-care;Strengthening/ROM      AM-PAC PT "6 Clicks" Mobility   Outcome Measure  Help needed turning from your back to your side while in a flat bed without using bedrails?: Total Help needed moving from lying on your back to sitting on the side of a flat bed without using bedrails?: Total Help needed moving to and from a bed  to a chair (including a wheelchair)?: Total Help needed standing up from a chair using your arms (e.g., wheelchair or bedside chair)?: Total Help needed to walk in hospital room?: Total Help needed climbing 3-5 steps with a railing? : Total 6 Click Score: 6    End of Session   Activity Tolerance: Patient limited by pain Patient left: in bed;with call bell/phone within reach;with family/visitor  present;Other (comment) (4 rails up for air bed) Nurse Communication: Mobility status;Patient requests pain meds;Other (comment) (RN/MD notified that pain meds are not decreasing L knee pain below 10/10 limiting his progression) PT Visit Diagnosis: Other abnormalities of gait and mobility (R26.89)     Time: 4481-8563 PT Time Calculation (min) (ACUTE ONLY): 49 min  Charges:  $Therapeutic Exercise: 8-22 mins                     Kwaku Mostafa P., PTA Acute Rehabilitation Services Secure Chat Preferred 9a-5:30pm Office: [place] 02/01/2022, 3:46 PM

## 2022-02-01 NOTE — Progress Notes (Signed)
CBG at 2130 was 56. Pt asymptomatic. Given G.Crackers with peanut butter and Nepro Shake. Repeat at 2230 was 85. Levimer Insulin held at that time and Dr. Marlowe Sax paged for further orders. Awaiting response

## 2022-02-01 NOTE — Progress Notes (Signed)
NEUROSURGERY PROGRESS NOTE  Doing well. No acute events overnight. Weakness is unchanged. Still is not interested in surgery at this time. Will continue to monitor for now.   Temp:  [98 F (36.7 C)-98.1 F (36.7 C)] 98.1 F (36.7 C) (08/31 0306) Pulse Rate:  [54-90] 74 (08/31 0400) Resp:  [10-22] 15 (08/31 0400) BP: (96-170)/(47-75) 161/62 (08/31 0400) SpO2:  [95 %-100 %] 96 % (08/31 0400) Weight:  [109.3 kg-116.6 kg] 116.6 kg (08/31 0400)    Eleonore Chiquito, NP 02/01/2022 6:49 AM

## 2022-02-02 DIAGNOSIS — G9341 Metabolic encephalopathy: Secondary | ICD-10-CM | POA: Diagnosis not present

## 2022-02-02 DIAGNOSIS — D62 Acute posthemorrhagic anemia: Secondary | ICD-10-CM | POA: Diagnosis not present

## 2022-02-02 DIAGNOSIS — R9389 Abnormal findings on diagnostic imaging of other specified body structures: Secondary | ICD-10-CM | POA: Diagnosis not present

## 2022-02-02 DIAGNOSIS — N179 Acute kidney failure, unspecified: Secondary | ICD-10-CM

## 2022-02-02 LAB — CBC WITH DIFFERENTIAL/PLATELET
Abs Immature Granulocytes: 0.08 10*3/uL — ABNORMAL HIGH (ref 0.00–0.07)
Basophils Absolute: 0 10*3/uL (ref 0.0–0.1)
Basophils Relative: 0 %
Eosinophils Absolute: 0.1 10*3/uL (ref 0.0–0.5)
Eosinophils Relative: 0 %
HCT: 27.5 % — ABNORMAL LOW (ref 39.0–52.0)
Hemoglobin: 8.8 g/dL — ABNORMAL LOW (ref 13.0–17.0)
Immature Granulocytes: 1 %
Lymphocytes Relative: 9 %
Lymphs Abs: 1.3 10*3/uL (ref 0.7–4.0)
MCH: 28 pg (ref 26.0–34.0)
MCHC: 32 g/dL (ref 30.0–36.0)
MCV: 87.6 fL (ref 80.0–100.0)
Monocytes Absolute: 1.4 10*3/uL — ABNORMAL HIGH (ref 0.1–1.0)
Monocytes Relative: 9 %
Neutro Abs: 11.6 10*3/uL — ABNORMAL HIGH (ref 1.7–7.7)
Neutrophils Relative %: 81 %
Platelets: 301 10*3/uL (ref 150–400)
RBC: 3.14 MIL/uL — ABNORMAL LOW (ref 4.22–5.81)
RDW: 15.2 % (ref 11.5–15.5)
WBC: 14.5 10*3/uL — ABNORMAL HIGH (ref 4.0–10.5)
nRBC: 0 % (ref 0.0–0.2)

## 2022-02-02 LAB — RENAL FUNCTION PANEL
Albumin: 1.8 g/dL — ABNORMAL LOW (ref 3.5–5.0)
Anion gap: 14 (ref 5–15)
BUN: 110 mg/dL — ABNORMAL HIGH (ref 8–23)
CO2: 26 mmol/L (ref 22–32)
Calcium: 9 mg/dL (ref 8.9–10.3)
Chloride: 96 mmol/L — ABNORMAL LOW (ref 98–111)
Creatinine, Ser: 6.81 mg/dL — ABNORMAL HIGH (ref 0.61–1.24)
GFR, Estimated: 8 mL/min — ABNORMAL LOW (ref >60)
Glucose, Bld: 72 mg/dL (ref 70–99)
Phosphorus: 4.2 mg/dL (ref 2.5–4.6)
Potassium: 4.2 mmol/L (ref 3.5–5.1)
Sodium: 136 mmol/L (ref 135–145)

## 2022-02-02 LAB — GLUCOSE, CAPILLARY
Glucose-Capillary: 131 mg/dL — ABNORMAL HIGH (ref 70–99)
Glucose-Capillary: 170 mg/dL — ABNORMAL HIGH (ref 70–99)
Glucose-Capillary: 183 mg/dL — ABNORMAL HIGH (ref 70–99)
Glucose-Capillary: 301 mg/dL — ABNORMAL HIGH (ref 70–99)
Glucose-Capillary: 70 mg/dL (ref 70–99)

## 2022-02-02 SURGERY — ANTERIOR CERVICAL DECOMPRESSION/DISCECTOMY FUSION 1 LEVEL
Anesthesia: General

## 2022-02-02 MED ORDER — OXYCODONE HCL 5 MG PO TABS: 5.0000 mg | ORAL_TABLET | Freq: Four times a day (QID) | ORAL | 0 refills | Status: DC | PRN

## 2022-02-02 MED ORDER — APIXABAN 5 MG PO TABS: 5.0000 mg | ORAL_TABLET | Freq: Two times a day (BID) | ORAL | Status: AC

## 2022-02-02 MED ORDER — HEPARIN SODIUM (PORCINE) 1000 UNIT/ML DIALYSIS: 1000.0000 [IU] | INTRAMUSCULAR | Status: DC | PRN

## 2022-02-02 MED ORDER — LEVEMIR 100 UNIT/ML ~~LOC~~ SOLN
30.0000 [IU] | Freq: Two times a day (BID) | SUBCUTANEOUS | 0 refills | Status: AC
Start: 2022-02-02 — End: ?

## 2022-02-02 MED ORDER — ANTICOAGULANT SODIUM CITRATE 4% (200MG/5ML) IV SOLN: 5.0000 mL | Status: DC | PRN

## 2022-02-02 MED ORDER — ALTEPLASE 2 MG IJ SOLR: 2.0000 mg | Freq: Once | INTRAMUSCULAR | Status: DC | PRN

## 2022-02-02 MED ORDER — HEPARIN SODIUM (PORCINE) 1000 UNIT/ML IJ SOLN
INTRAMUSCULAR | Status: AC
  Administered 2022-02-02: 3200 [IU]
  Filled 2022-02-02: qty 4

## 2022-02-02 MED ORDER — LIDOCAINE-PRILOCAINE 2.5-2.5 % EX CREA: 1.0000 | TOPICAL_CREAM | CUTANEOUS | Status: DC | PRN

## 2022-02-02 MED ORDER — INSULIN LISPRO (1 UNIT DIAL) 100 UNIT/ML (KWIKPEN): PEN_INJECTOR | SUBCUTANEOUS | Status: AC

## 2022-02-02 MED ORDER — INSULIN ASPART 100 UNIT/ML IJ SOLN
0.0000 [IU] | INTRAMUSCULAR | Status: DC
  Administered 2022-02-02: 2 [IU] via SUBCUTANEOUS
  Administered 2022-02-02: 7 [IU] via SUBCUTANEOUS

## 2022-02-02 MED ORDER — LIDOCAINE HCL (PF) 1 % IJ SOLN: 5.0000 mL | INTRAMUSCULAR | Status: DC | PRN

## 2022-02-02 MED ORDER — PENTAFLUOROPROP-TETRAFLUOROETH EX AERO: 1.0000 | INHALATION_SPRAY | CUTANEOUS | Status: DC | PRN

## 2022-02-02 MED ORDER — RENA-VITE PO TABS: 1.0000 | ORAL_TABLET | Freq: Every day | ORAL | 0 refills | Status: AC

## 2022-02-02 MED ORDER — OXYCODONE HCL 5 MG PO TABS
5.0000 mg | ORAL_TABLET | Freq: Four times a day (QID) | ORAL | 0 refills | Status: AC | PRN
Start: 2022-02-02 — End: ?

## 2022-02-02 MED ORDER — PANTOPRAZOLE SODIUM 40 MG PO TBEC: 40.0000 mg | DELAYED_RELEASE_TABLET | Freq: Two times a day (BID) | ORAL | Status: AC

## 2022-02-02 MED ORDER — CALCIUM ACETATE (PHOS BINDER) 667 MG PO CAPS: 1334.0000 mg | ORAL_CAPSULE | Freq: Three times a day (TID) | ORAL | Status: AC

## 2022-02-02 NOTE — TOC Transition Note (Signed)
Transition of Care [facility]) - CM/SW Discharge Note   Patient Details  Name: [name] MRN: [id number] Date of Birth: 1955-08-23  Transition of Care [facility]) CM/SW Contact:  Benard Halsted, LCSW Phone Number: 02/02/2022, 3:30 PM   Clinical Narrative:    Patient will DC to: [name] Anticipated DC date: 02/02/22 Family notified: Son, Gwenlyn Fudge Transport by: Corey Harold. Please cancel transport if pt will arrive at [facility] past 12am    Per MD patient ready for DC to [facility]. RN to call report prior to discharge [phone]). RN, patient, patient's family, and facility, and dialysis center notified of DC. Discharge Summary and FL2 sent to facility. DC packet on chart with signed script. Ambulance transport requested for patient.   CSW will sign off for now as social work intervention is no longer needed. Please consult [place] again if new needs arise.     Final next level of care: Skilled Nursing Facility Barriers to Discharge: Barriers Resolved   Patient Goals and CMS Choice Patient states their goals for this hospitalization and ongoing recovery are:: return home CMS Medicare.gov Compare Post Acute Care list provided to:: Patient Choice offered to / list presented to : Patient, Adult Children  Discharge Placement   Existing PASRR number confirmed : 02/02/22          Patient chooses bed at: [facility] Patient to be transferred to facility by: [place] Name of family member notified: Son Patient and family notified of of transfer: 02/02/22  Discharge Plan and Services In-house Referral: Clinical Social Work   Post Acute Care Choice: [place]                               Social Determinants of Health (SDOH) Interventions     Readmission Risk Interventions     No data to display

## 2022-02-02 NOTE — Progress Notes (Signed)
No response from Dr. Marlowe Sax. CBG now 170. Given 35 units Levimer subcu at this time. Dr.Rathore texted

## 2022-02-02 NOTE — Progress Notes (Signed)
Pt left unit per PTAR for adm to [facility]. Pt left in stable condition. Note on front of AVS envelope to check bld sugar around midnight because pt had Novolog Insulin at 2100 and usually doesn't get it at HS. Pt also received his scheduled dose of Levimer Insulin

## 2022-02-02 NOTE — Progress Notes (Addendum)
Received patient in bed to unit. Alert and oriented. Informed consent signed and in chart.   Treatment initiated: 0930 Treatment completed: 1320  Patient tolerated well. Transported back to the room Alert, without acute distress.  Hand-off given to patient's nurse.   Access used: Right HD catheter Access issues: none  Total UF removed: 3.5L Medication(s) given: Aranesp, Calcitriol, Ferric Gluconate  Post HD VS: 105/50 67 97% 98.5., 22 Post HD weight: 108kg    [facility]

## 2022-02-02 NOTE — Progress Notes (Signed)
PROGRESS NOTE        PATIENT DETAILS Name: [name] Age: 66 y.o. Sex: male Date of Birth: 05/28/1956 Admit Date: 01/15/2022 Admitting Physician Lequita Halt, MD OVF:IEPPI, Malva Limes., FNP  Brief Summary: Patient is a 66 y.o.  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  Started on IV steroids for gouty arthritis-evaluated by nephrology and started on HD.  Hospital course complicated by A-fib RVR, lower GI bleeding and slow clinical improvement.  See below for further details.    Significant events: 8/14>> admit for confusion/gouty arthritis/progression to ESRD. 8/16>> worsening transaminases/encephalopathy/renal function-renal planning HD. 8/17>> TDC placed-first HD planned. 8/18>> Afib RVR with first HD session-HD cut short. Spontaneously converted to sinus rhythm.Oozing from nontunneled HD cath site.  8/19>> Brief run of RVR.  Tolerated HD without any incident. 8/20>> Worsening encephalopathy-urgent HD, 2 episodes of painless small-volume hematochezia. Hemoglobin dropped to 6.7-given 2 units of PRBC with HD.  8/21>> less encephalopathic-answering simple questions-another episode (third) of hematochezia earlier this morning. 8/24>> intra-articular steroid injections to bilateral knee 8/25>> small-volume hematochezia reoccurred  Significant studies: 8/15>> MRI brain: No acute CVA. 8/15>> renal ultrasound: No hydronephrosis. 8/14>> left knee synovial fluid: WBC 23,835, extracellular sodium urate crystals. 8/15>> right knee synovial fluid: WBC 15,600, intracellular monosodium urate crystals. 8/16>> Echo: EF 55-60% 8/16>> RUQ ultrasound: No major abnormalities noted. 8/28 MRI C Spine - 1. Prominent central disc protrusion at C5-6 with severe  central canal stenosis. 2. Increased T2 signal in the left hemi cord at C5-6 compatible with chronic myelomalacia. 3. Mild foraminal narrowing bilaterally at C5-6. 4. Mild central and moderate foraminal stenosis bilaterally at C6-7. 5. Severe right foraminal stenosis at C3-4.    Significant microbiology data: 8/14>> blood culture: No growth 8/14>> left knee synovial fluid: No growth 8/15>> right knee synovial fluid: No growth  Procedures: 8/14>> left knee arthrocentesis 8/15>> right knee arthrocentesis 8/17>> tunneled HD catheter placement by IR. 8/24>> intra-articular steroid injection to bilateral knee by orthopedics.  Consults: Nephrology. Orthopedics Cardiology Vascular ID GI Neurosurgery   Subjective:  Patient in bed, appears comfortable, denies any headache, no fever, no chest pain or pressure, no shortness of breath , no abdominal pain. No new focal weakness.   Objective: Vitals: Blood pressure (!) 146/69, pulse 75, temperature 97.6 F (36.4 C), temperature source Oral, resp. rate 18, height '5\' 11"'$  (1.803 m), weight 117 kg, SpO2 96 %.   Exam:  Awake Alert, No new F.N deficits, Normal affect [place].AT,PERRAL Supple Neck, No JVD,   Symmetrical Chest wall movement, Good air movement bilaterally, CTAB RRR,No Gallops, Rubs or new Murmurs,  +ve B.Sounds, Abd Soft, No tenderness,   No Cyanosis, Clubbing or edema     Assessment/Plan:  SIRS due to acute gouty arthritis involving bilateral knees, numerous joints of left hand: Bilateral knee/left hand arthralgias better-patient underwent intra-articular steroid injection to bilateral knee on 8/24.  He did not have a significant response to IV Solu-Medrol/prednisone.  Suspect that patient not only has acute gouty arthritis but significant amount of OA at baseline as well.  No evidence of infection-blood/sign of blood cultures negative.  Septic arthritis/sepsis ruled out.  Evaluated by infectious disease during  the earlier part of  this hospitalization-with recommendations to continue to monitor off antibiotics.  Orthopedics (Dr. Alvan Dame) continues to follow periodically, he continues to have intense left knee pain unable to bear weight, will request orthopedics to reevaluate 1 time on 02/02/2022.  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  Hyperkalemia: Due to worsening renal function-resolved with Lokelma and HD.  Continue to monitor periodically.  Oozing from tunneled HD catheter site: Evaluated by IR on 8/18-bleeding site sutured-no further bleeding evident.  A-fib with RVR: Triggered by uremia/acute illness-now maintaining sinus rhythm on oral beta-blocker-Eliquis on hold due to ongoing lower GI bleeding.  Cardiology has signed off.  Lower GI bleeding: Has had intermittent hematochezia for the past few days-last episode on 8/25.  Did require 2 units of PRBC transfusion on 8/20.  GI consulted but given lingering encephalopathy-severe debility-felt to be too weak to tolerate a colonoscopy prep-with recommendations to reconsult GI when encephalopathy/debility have improved.  GI recommended to hold Eliquis 5 days from 8/22-had another episode of hematochezia after that, now no further bleeding now commence Eliquis with caution on 02/01/2022.  Normocytic anemia: Multifactorial etiology-due to acute illness/CKD/acute blood loss anemia due to lower GI bleeding.  Transfused 2 units of PRBC on 8/20-slight drop in hemoglobin today-CBC remains stable will restart anticoagulation cautiously on 02/01/2022 and monitor.  Acute metabolic encephalopathy: Due to AKI/uremia-improving.  Much more awake and alert today.    Transaminitis: Unclear etiology-suspect probably medication related-possibly related to cefepime.  Statin/Zytiga was held-LFTs have improved.  RUQ ultrasound was unremarkable.    Hyponatremia: Related to volume-mild-should improve with further HD episodes.  Chronic  left arm weakness: Per patient's son-this has been ongoing for the past several months-son has noted a problem with the patient's grip mostly.  Due to left hand arthralgia/L UE swelling (after superficialization of AV fistula)-this has worsened somewhat.  However over the past few days-with decreasing swelling/arthralgias-he seems to move his left arm more fluently.  MRI brain negative for CVA.  Noted MRI of C-spine, will DW NS.  Family thinking about surgical correction this admission versus few months down the line.  We will be a moderate to high risk candidate for adverse cardiopulmonary outcome during the perioperative period.  Superficialization of left upper extremity AV fistula by vascular surgery on 8/8: With some residual arm swelling that seems to have that had worsened but has since improved over the past few days with elevation.  Appreciate vascular surgery input.  Continue to follow closely.    Prostate cancer: On prednisone/Zytiga chronically.  Zytiga briefly held due to elevated LFTs-has been tapered back to his usual prednisone dosing, stable LFTs now.  DM-2 (A1c 9.6 on 8/14) with uncontrolled hyperglycemia due to steroids: CBGs fluctuating quite a bit-insulin dose adjusted further on 02/01/2022 will monitor closely  Will need to permanently stop glipizide and metformin on discharge.  Lab Results  Component Value Date   HGBA1C 9.6 (H) 01/15/2022   CBG (last 3)  Recent Labs    02/01/22 2234 02/02/22 0010 02/02/22 0436  GLUCAP 85 170* 131*     Morbid Obesity: Estimated body mass index is 35.98 kg/m as calculated from the following:   Height as of this encounter: '5\' 11"'$  (1.803 m).   Weight as of this encounter: 117 kg.   Code status:   Code Status: Full Code   DVT Prophylaxis:Hold Eliquis due to ongoing hematochezia. apixaban (ELIQUIS) tablet 5 mg   Family Communication: Son at  bedside on 01/30/2022.   Disposition Plan: Status is: Inpatient Remains inpatient  appropriate because: Likely progression to ESRD-nephrology initiating dialysis-remains encephalopathic-continues to have intermittent hematochezia-needs colonoscopy-not yet stable for discharge.   Planned Discharge Destination:SNF  Diet: Diet Order             Diet Carb Modified Fluid consistency: Thin; Room service appropriate? Yes with Assist; Fluid restriction: 1200 mL Fluid  Diet effective now                   MEDICATIONS: Scheduled Meds:  abiraterone acetate  1,000 mg Oral Daily   apixaban  5 mg Oral BID   calcitRIOL  0.25 mcg Oral Daily   calcium acetate  1,334 mg Oral TID WC   carvedilol  25 mg Oral BID WC   Chlorhexidine Gluconate Cloth  6 each Topical Q0600   colchicine  0.3 mg Oral Daily   darbepoetin (ARANESP) injection - DIALYSIS  60 mcg Intravenous Q Fri-HD   feeding supplement (NEPRO CARB STEADY)  237 mL Oral BID BM   insulin aspart  0-9 Units Subcutaneous Q4H   insulin detemir  35 Units Subcutaneous BID   multivitamin  1 tablet Oral QHS   pantoprazole  40 mg Oral BID   predniSONE  5 mg Oral Q breakfast   sodium chloride flush  10-40 mL Intracatheter Q12H   Continuous Infusions:  anticoagulant sodium citrate     ferric gluconate (FERRLECIT) IVPB Stopped (01/31/22 1249)  PRN Meds:.acetaminophen, alteplase, anticoagulant sodium citrate, heparin, hydrALAZINE, lidocaine (PF), lidocaine-prilocaine, LORazepam, metoprolol tartrate, ondansetron (ZOFRAN) IV, pentafluoroprop-tetrafluoroeth, sodium chloride, traMADol   I have personally reviewed following labs and imaging studies  LABORATORY DATA:  Recent Labs  Lab 01/27/22 0744 01/28/22 0258 01/29/22 0800 02/01/22 0524  WBC 17.3* 17.0* 16.0* 15.4*  HGB 7.9* 8.5* 8.4* 8.9*  HCT 25.1* 26.5* 26.4* 27.0*  PLT 309 333 335 308  MCV 87.8 88.0 88.9 85.2  MCH 27.6 28.2 28.3 28.1  MCHC 31.5 32.1 31.8 33.0  RDW 15.1 14.9 14.9 14.9  LYMPHSABS  --   --   --  1.3  MONOABS  --   --   --  1.3*  EOSABS  --   --   --   0.0  BASOSABS  --   --   --  0.0    Recent Labs  Lab 01/27/22 0745 01/28/22 0258 01/29/22 0800 01/31/22 1407 02/01/22 0524 02/01/22 1609  NA 131* 134* 134*  --  134*  --   K 4.1 3.7 3.8  --  4.6  --   CL 93* 94* 96*  --  94*  --   CO2 '23 25 25  '$ --  28  --   GLUCOSE 278* 179* 103*  --  268*  --   BUN 116* 69* 112*  --  80*  --   CREATININE 7.80* 5.61* 7.28*  --  5.76*  --   CALCIUM 8.3* 8.2* 8.1*  --  8.8*  --   AST  --   --   --  21 20  --   ALT  --   --   --  16 15  --   ALKPHOS  --   --   --  87 73  --   BILITOT  --   --   --  0.7 0.6  --   ALBUMIN 1.6* 1.7* 1.8* 2.0* 1.8*  --   PHOS 8.7* 6.4* 8.0*  --  4.8*  --  AMMONIA  --   --   --   --   --  12   RADIOLOGY STUDIES/RESULTS: DG Knee Complete 4 Views Left  Result Date: 02/01/2022 CLINICAL DATA:  Recurrent knee pain EXAM: LEFT KNEE - COMPLETE 4+ VIEW COMPARISON:  03/15/2009 FINDINGS: Progressive osteoarthritis most pronounced in the medial compartment with joint space loss, sclerosis and bony osteophytes. No acute osseous finding, fracture, malalignment, or effusion. Bones are osteopenic. No focal soft tissue abnormality. IMPRESSION: Progressive left knee degenerative osteoarthritis most pronounced medially. No acute finding. Electronically Signed   By: Jerilynn Mages.  Shick M.D.   On: 02/01/2022 15:33     LOS: 18 days   Signature  Lala Lund M.D on 02/02/2022 at 7:57 AM   -  To page go to [email or web address]

## 2022-02-02 NOTE — Procedures (Signed)
Patient was seen on dialysis and the procedure was supervised.  BFR 400  Via TDC BP is  168/71.   Patient appears to be tolerating treatment well.  Tyah Acord Tanna Furry 02/02/2022

## 2022-02-02 NOTE — Progress Notes (Signed)
Patient ID: [name], male   DOB: [birth year], 66 y.o.   MRN: [id number] Patient is not interested in surgical decompression at this time.  He understands that this increases the risk of permanent neurologic deficit.  He appears to be at high risk for surgery according to the medical note.  Since he and his son are in no way interested in surgical intervention at this time, we will arrange follow-up as an outpatient with [place].  Please call us if we can be of further assistance while he is in the hospital.

## 2022-02-02 NOTE — Progress Notes (Signed)
Pt to d/c to snf today. Contacted [facility] and spoke to [place]. Clinic advised that pt will start on Monday. Contacted renal NP regarding clinic's need for orders. Pt's out-pt HD arrangements added to AVS. Contacted CSW to request that snf be advised of pt's arrival time of 11:15-11:30 on Monday to complete paperwork prior to 12:00 chair time. Pt has a MWF schedule. Spoke to pt's son via phone. Reviewed out-pt HD arrangements and advised son of pt's requested arrival time for Monday in the event son wants to be present at appt for paperwork at HD clinic.   Melven Sartorius Renal Navigator [phone]

## 2022-02-02 NOTE — Progress Notes (Signed)
PT Cancellation Note  Patient Details Name: [name] MRN: [id number] DOB: [birth year]   Cancelled Treatment:    Reason Eval/Treat Not Completed: (P) Patient at procedure or test/unavailable (pt off unit (HD).) Will continue efforts next date per PT plan of care as schedule permits.   Kara Pacer Xia Stohr 02/02/2022, 1:57 PM

## 2022-02-02 NOTE — Discharge Summary (Addendum)
[name] TJQ:[id number] DOB: Nov 09, 1955 DOA: 01/15/2022  PCP: Sonia Side., FNP  Admit date: 01/15/2022  Discharge date: 02/02/2022  Admitted From: Home   Disposition:  SNF   Recommendations for Outpatient Follow-up:   Follow up with PCP in 1-2 weeks  PCP Please obtain BMP/CBC, 2 view CXR in 1week,  (see Discharge instructions)   PCP Please follow up on the following pending results: CBGs closely and adjust insulin dose as needed, needs outpatient orthopedics and neurosurgery follow-up.   Home Health: None   Equipment/Devices: None  Discharge Condition: Stable    CODE STATUS: Full    Diet Recommendation: Renal-low carbohydrate diet with 1.2 L fluid restriction per day   Chief Complaint  Patient presents with   Altered Mental Status     Brief history of present illness from the day of admission and additional interim summary    66 y.o.  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  Started on IV steroids for gouty arthritis-evaluated by nephrology and started on HD.  Hospital course complicated by A-fib RVR, lower GI bleeding and slow clinical improvement.  See below for further details.     Significant events: 8/14>> admit for confusion/gouty arthritis/progression to ESRD. 8/16>> worsening transaminases/encephalopathy/renal function-renal planning HD. 8/17>> TDC placed-first HD planned. 8/18>> Afib RVR with first HD session-HD cut short. Spontaneously converted to sinus rhythm.Oozing from nontunneled HD cath site.  8/19>> Brief run of RVR.  Tolerated HD without any incident. 8/20>>  Worsening encephalopathy-urgent HD, 2 episodes of painless small-volume hematochezia. Hemoglobin dropped to 6.7-given 2 units of PRBC with HD.  8/21>> less encephalopathic-answering simple questions-another episode (third) of hematochezia earlier this morning. 8/24>> intra-articular steroid injections to bilateral knee 8/25>> small-volume hematochezia reoccurred   Significant studies: 8/15>> MRI brain: No acute CVA. 8/15>> renal ultrasound: No hydronephrosis. 8/14>> left knee synovial fluid: WBC 23,835, extracellular sodium urate crystals. 8/15>> right knee synovial fluid: WBC 15,600, intracellular monosodium urate crystals. 8/16>> Echo: EF 55-60% 8/16>> RUQ ultrasound: No major abnormalities noted. 8/28 MRI C Spine - 1. Prominent central disc protrusion at C5-6 with severe central canal stenosis. 2. Increased T2 signal in the left hemi cord at C5-6 compatible with chronic myelomalacia. 3. Mild foraminal narrowing bilaterally at C5-6. 4. Mild central and moderate foraminal stenosis bilaterally at C6-7. 5. Severe right foraminal stenosis at C3-4.      Significant microbiology data: 8/14>> blood culture: No growth 8/14>> left knee synovial fluid: No growth 8/15>> right knee synovial fluid: No growth   Procedures: 8/14>> left knee arthrocentesis 8/15>> right knee arthrocentesis 8/17>> tunneled HD catheter placement by IR. 8/24>> intra-articular steroid injection to bilateral knee by orthopedics.   Consults: Nephrology. Orthopedics Cardiology Vascular ID GI Neurosurgery  Hospital Course   SIRS due to acute gouty arthritis involving bilateral knees, numerous joints of left hand: Bilateral knee/left hand arthralgias better-patient underwent intra-articular steroid injection to bilateral knee on 8/24.  He did not have a significant response to IV Solu-Medrol/prednisone.  Suspect that patient not only has acute gouty  arthritis but significant amount of OA at baseline as well.  No evidence of infection-blood/sign of blood cultures negative.  Septic arthritis/sepsis ruled out.  Evaluated by infectious disease during the earlier part of this hospitalization-with recommendations to continue to monitor off antibiotics.  3 seen by orthopedics on 02/02/2022, likely has advanced osteoarthritis and will require possible total knee replacement in the outpatient setting.  At this time discharge with supportive care to SNF, PT OT with outpatient orthopedics follow-up.   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   Oozing from tunneled HD catheter site: Evaluated by IR on 8/18-bleeding site sutured-no further bleeding evident.   A-fib with RVR: Triggered by uremia/acute illness-now maintaining sinus rhythm on oral beta-blocker-Eliquis, Cardiology has signed off.   Lower GI bleeding: Has had intermittent hematochezia for the past few days-last episode on 8/25.  Did require 2 units of PRBC transfusion on 8/20.  GI consulted but given lingering encephalopathy-severe debility-felt to be too weak to tolerate a colonoscopy prep-with recommendations to reconsult GI when encephalopathy/debility have improved.  GI recommended to hold Eliquis 5 days from 8/22-now tolerating Eliquis, no further episodes of GI bleed.  Normocytic anemia: Multifactorial etiology-due to acute illness/CKD/acute blood loss anemia due to lower GI bleeding.  Transfused 2 units of PRBC on 8/20-slight drop in hemoglobin today-CBC remains stable, currently recheck CBC in 5 to 7 days at [facility].  Acute metabolic encephalopathy: Due to AKI/uremia.  Resolved.   Transaminitis: Unclear etiology-suspect probably medication related-possibly related to cefepime.  Statin/Zytiga was held-LFTs have improved.  RUQ ultrasound was unremarkable.     Hyponatremia: Related to volume-mild-should improve with further HD episodes.    Chronic left arm weakness: Per patient's son-this has been ongoing for the past several months-son has noted a problem with the patient's grip mostly.  Due to left hand arthralgia/L UE swelling (after superficialization of AV fistula)-this has worsened somewhat.  However over the past few days-with decreasing swelling/arthralgias-he seems to move his left arm more fluently.  MRI brain negative for CVA.  Noted MRI of C-spine, will DW NS.  Seen by neurosurgery family has decided for outpatient follow-up.   Superficialization of left upper extremity AV fistula by vascular surgery on 8/8: With some residual arm swelling that seems to have that had worsened but has since improved over the past few days with elevation.  Appreciate vascular surgery input.  Continue to follow closely.     Prostate cancer: On prednisone/Zytiga chronically.  Zytiga briefly held due to elevated LFTs-has been tapered back to his usual prednisone dosing, stable LFTs now.   DM-2 (A1c 9.6 on 8/14) with uncontrolled hyperglycemia due to steroids: CBGs fluctuating quite a bit-insulin dose adjusted further on 02/01/2022 will monitor closely at SNF and adjust further.  His sugars are quite labile.   Lab Results  Component Value Date   HGBA1C 9.6 (H) 01/15/2022   CBG (last 3)  Recent Labs    02/02/22 0010 02/02/22 0436 02/02/22 0823  GLUCAP 170* 131* 70     Discharge diagnosis     Principal Problem:   Sepsis ([place]) Active Problems:   SIRS (systemic inflammatory response  syndrome) (HCC)   Swelling of left hand   Gout flare   Left knee pain   Acute metabolic encephalopathy   Hypoglycemia associated with type 2 diabetes mellitus (HCC)   Anemia   Essential hypertension   CKD (chronic kidney disease) stage 5, GFR less than 15 ml/min (HCC)   Left arm weakness   Current chronic use of systemic steroids   Obesity (BMI 30-39.9)   Prostate cancer ([place])   Abnormal CXR   AKI (acute kidney injury) ([place])   Paroxysmal atrial  fibrillation ([place])   Hematochezia   Long term current use of anticoagulant therapy   Acute blood loss anemia   Leukocytosis   Gouty arthritis    Discharge instructions    Discharge Instructions     Discharge instructions   Complete by: As directed    Follow with Primary MD Sonia Side., FNP in 7 days   Get CBC, CMP, 2 view Chest X ray -  checked next visit within 1 week by SNF MD    Activity: As tolerated with Full fall precautions use walker/cane & assistance as needed  Disposition SNF  Diet: Renal - low carbohydrate diet with 1200 cc fluid restriction.  Check CBGs q. ACH S at SNF.  Adjust insulin as needed.  Special Instructions: If you have smoked or chewed Tobacco  in the last 2 yrs please stop smoking, stop any regular Alcohol  and or any Recreational drug use.  On your next visit with your primary care physician please Get Medicines reviewed and adjusted.  Please request your Prim.MD to go over all Hospital Tests and Procedure/Radiological results at the follow up, please get all Hospital records sent to your Prim MD by signing hospital release before you go home.  If you experience worsening of your admission symptoms, develop shortness of breath, life threatening emergency, suicidal or homicidal thoughts you must seek medical attention immediately by calling 911 or calling your MD immediately  if symptoms less severe.  You Must read complete instructions/literature along with all the possible adverse reactions/side effects for all the Medicines you take and that have been prescribed to you. Take any new Medicines after you have completely understood and accpet all the possible adverse reactions/side effects.   Increase activity slowly   Complete by: As directed    No wound care   Complete by: As directed        Discharge Medications   Allergies as of 02/02/2022   No Known Allergies      Medication List     STOP taking these medications    aspirin 81 MG  tablet   furosemide 80 MG tablet Commonly known as: LASIX   glipiZIDE 10 MG tablet Commonly known as: GLUCOTROL   hydrALAZINE 50 MG tablet Commonly known as: APRESOLINE   metFORMIN 500 MG tablet Commonly known as: GLUCOPHAGE       TAKE these medications    abiraterone acetate 250 MG tablet Commonly known as: ZYTIGA Take 1,000 mg by mouth daily.   acetaminophen 500 MG tablet Commonly known as: TYLENOL Take 500-1,000 mg by mouth every 6 (six) hours as needed for moderate pain.   apixaban 5 MG Tabs tablet Commonly known as: ELIQUIS Take 1 tablet (5 mg total) by mouth 2 (two) times daily.   atorvastatin 80 MG tablet Commonly known as: LIPITOR Take 1 tablet (80 mg total) by mouth daily.   Bayer Microlet Lancets lancets Check blood sugar 3 times a day as instructed  calcitRIOL 0.25 MCG capsule Commonly known as: ROCALTROL Take 0.25 mcg by mouth daily.   calcium acetate 667 MG capsule Commonly known as: PHOSLO Take 2 capsules (1,334 mg total) by mouth 3 (three) times daily with meals.   carvedilol 25 MG tablet Commonly known as: COREG TAKE 1 TABLET BY MOUTH TWICE DAILY WITH A MEAL   glucose blood test strip Commonly known as: ONE TOUCH ULTRA TEST The patient is insulin requiring, ICD 10 code E11.65. The patient tests 3 times per day.   insulin lispro 100 UNIT/ML KwikPen Commonly known as: HUMALOG Before each meal 3 times a day, 140-199 - 2 units, 200-250 - 4 units, 251-299 - 8 units,  300-349 - 12 units,  350 or above 15 units. What changed:  medication strength how much to take how to take this when to take this additional instructions   Insulin Pen Needle 31G X 6 MM Misc 1 Stick by Other route 3 (three) times daily. Use with insulin and liraglutide.   Levemir 100 UNIT/ML injection Generic drug: insulin detemir Inject 0.3 mLs (30 Units total) into the skin 2 (two) times daily. What changed: how much to take   multivitamin Tabs tablet Take 1 tablet  by mouth at bedtime.   ONE TOUCH ULTRA MINI w/Device Kit 1 strip by Other route 3 (three) times daily.   oxyCODONE 5 MG immediate release tablet Commonly known as: Oxy IR/ROXICODONE Take 1 tablet (5 mg total) by mouth every 6 (six) hours as needed for severe pain. What changed: reasons to take this   pantoprazole 40 MG tablet Commonly known as: PROTONIX Take 1 tablet (40 mg total) by mouth 2 (two) times daily.   predniSONE 5 MG tablet Commonly known as: DELTASONE Take 5 mg by mouth daily.         Contact information for follow-up providers     Center, [facility]. Go on 02/05/2022.   Why: Schedule is Monday,Wednesday,Friday with 12:00 chair time.  Please arrive at 11:15 for first appointment to complete paperwork prior to treatment. Contact information: [place] [address] [phone]         Eustace Moore, MD. Schedule an appointment as soon as possible for a visit in 4 week(s).   Specialty: Neurosurgery Contact information: [address] [place] [address] [phone]         Sonia Side., FNP. Schedule an appointment as soon as possible for a visit in 1 week(s).   Specialty: Family Medicine Contact information: [place] [address] [phone]         Sanda Klein, MD .   Specialty: Cardiology Contact information: [address] [place] [address] [phone]         Gaynelle Arabian, MD. Schedule an appointment as soon as possible for a visit in 1 week(s).   Specialty: Orthopedic Surgery Contact information: [address] [place] [address] [phone]              Contact information for after-discharge care     Destination     [name] Preferred SNF .   Service: Skilled Nursing Contact information: [address] [place] [phone]                     Major  procedures and Radiology Reports - PLEASE review detailed and final reports thoroughly  -  DG Knee Complete 4 Views Left  Result Date: 02/01/2022 CLINICAL DATA:  Recurrent knee pain EXAM: LEFT KNEE - COMPLETE 4+ VIEW COMPARISON:  03/15/2009 FINDINGS: Progressive osteoarthritis most pronounced in the medial compartment with joint space loss, sclerosis and bony osteophytes. No acute osseous finding, fracture, malalignment, or effusion. Bones are osteopenic. No focal soft tissue abnormality. IMPRESSION: Progressive left knee degenerative osteoarthritis most pronounced medially. No acute finding. Electronically Signed   By: Jerilynn Mages.  Shick M.D.   On: 02/01/2022 15:33   MR CERVICAL SPINE WO CONTRAST  Result Date: 01/28/2022 CLINICAL DATA:  Myelopathy, acute, cervical spine. Left upper extremity weakness. EXAM: MRI CERVICAL SPINE WITHOUT CONTRAST TECHNIQUE: Multiplanar, multisequence MR imaging of the cervical spine was performed. No intravenous contrast was administered. COMPARISON:  None Available. FINDINGS: Alignment: No significant listhesis is present. Straightening of the normal cervical lordosis is present. Vertebrae: Marrow signal and vertebral body heights are normal. Cord: Distortion of the ventral surface the cord is noted at C5 increased T2 signal on the left. Cord signal and morphology is otherwise within. Posterior Fossa, vertebral arteries, paraspinal tissues: Craniocervical junction is normal. Flow is present in the vertebral arteries bilaterally. A remote lacunar infarct present the central pons. Visualized intracranial contents are otherwise within normal limits. Disc levels: C2-3: Negative. C3-4: Rightward disc osteophyte complex partially effaces ventral CSF. Severe right foraminal stenosis is present. Left foramen is patent. C4-5: Shallow broad-based disc osteophyte complex is present. No significant stenosis is present. C5-6: A prominent central disc protrusion is present. Osteophytic  spurring is noted. Canal is narrowed to 5 mm midline. Increased T2 signal is present in the left hemi cord. Mild foraminal narrowing is present bilaterally. C6-7: A broad-based disc osteophyte complex effaces the ventral CSF. Moderate foraminal stenosis is present bilaterally. C7-T1: Negative. IMPRESSION: 1. Prominent central disc protrusion at C5-6 with severe central canal stenosis. 2. Increased T2 signal in the left hemi cord at C5-6 compatible with chronic myelomalacia. 3. Mild foraminal narrowing bilaterally at C5-6. 4. Mild central and moderate foraminal stenosis bilaterally at C6-7. 5. Severe right foraminal stenosis at C3-4. Electronically Signed   By: San Morelle M.D.   On: 01/28/2022 19:41   IR Fluoro Guide CV Line Right  Result Date: 01/24/2022 INDICATION: End-stage renal disease. In need of durable intravenous access for continuation of hemodialysis. Please convert temporary dialysis catheter (placed on 01/18/2022 by Dr. Serafina Royals), to a tunneled hemodialysis catheter. EXAM: FLUOROSCOPIC GUIDED CONVERSION OF NON TUNNELED TO TUNNELED HEMODIALYSIS CATHETER COMPARISON:  Image guided temporary dialysis catheter placement-01/18/2022 MEDICATIONS: None ANESTHESIA/SEDATION: Moderate (conscious) sedation was employed during this procedure as administered by the Interventional Radiology RN. A total of Versed 1.5 mg and Fentanyl 75 mcg was administered intravenously. Moderate Sedation Time: 20 minutes. The patient's level of consciousness and vital signs were monitored continuously by radiology nursing throughout the procedure under my direct supervision. FLUOROSCOPY TIME:  48 seconds (8 mGy) COMPLICATIONS: None immediate. PROCEDURE: Informed written consent was obtained from the patient's son after a discussion of the risks, benefits, and alternatives to treatment. Questions regarding the procedure were encouraged and answered. The skin and external portion of the existing hemodialysis catheter was  prepped with chlorhexidine in a sterile fashion, and a sterile drape was applied covering the operative field. Maximum barrier sterile technique with sterile gowns and gloves were used for the procedure. A timeout was performed prior to the initiation of the procedure. A lumen of the non tunneled temporary dialysis catheter was cannulated with a stiff Glidewire and utilized  for measurement purposes. Next, the stiff Glidewire was advanced to the level of the IVC. A 19 cm tip to cuff palindrome dialysis catheter was tunneled from a site along the anterior chest to the venotomy site. Under intermittent fluoroscopic guidance, the temporary dialysis catheter was exchanged for a peel-away sheath. The tunneled dialysis catheter was inserted into the peel-away sheath with tips ultimately terminating within the superior aspect of the right atrium. Final catheter positioning was confirmed and documented with a spot fluoroscopic image. The catheter aspirates and flushes normally. The catheter was flushed with appropriate volume heparin dwells. The catheter exit site was secured with a 0-Prolene retention sutures. The venotomy site was closed with 2 interrupted 4 0 Vicryl sutures and the skin was apposed with Dermabond and Steri-Strips. Both lumens were heparinized. A dressing was applied. The patient tolerated the procedure well without immediate post procedural complication. IMPRESSION: Successful fluoroscopic guided conversion of a temporary to a permanent 19 cm tip to cuff tunneled hemodialysis catheter with tips terminating within the superior aspect of the right atrium. The new tunneled dialysis catheter is ready for immediate use. Electronically Signed   By: Sandi Mariscal M.D.   On: 01/24/2022 16:31   IR Fluoro Guide CV Line Right  Result Date: 01/18/2022 INDICATION: 66 year old male with worsening end-stage renal failure requiring initiation of hemodialysis. Temporary hemodialysis catheter requested. EXAM:  NON-TUNNELED CENTRAL VENOUS HEMODIALYSIS CATHETER PLACEMENT WITH ULTRASOUND AND FLUOROSCOPIC GUIDANCE COMPARISON:  None Available. MEDICATIONS: None FLUOROSCOPY TIME:  One mGy COMPLICATIONS: None immediate. PROCEDURE: Informed written consent was obtained from the patient after a discussion of the risks, benefits, and alternatives to treatment. Questions regarding the procedure were encouraged and answered. The right neck and chest were prepped with chlorhexidine in a sterile fashion, and a sterile drape was applied covering the operative field. Maximum barrier sterile technique with sterile gowns and gloves were used for the procedure. A timeout was performed prior to the initiation of the procedure. After the overlying soft tissues were anesthetized, a small venotomy incision was created and a micropuncture kit was utilized to access the internal jugular vein. Real-time ultrasound guidance was utilized for vascular access including the acquisition of a permanent ultrasound image documenting patency of the accessed vessel. The microwire was utilized to measure appropriate catheter length. A Rosen wire was advanced to the level of the IVC. Under fluoroscopic guidance, the venotomy was serially dilated, ultimately allowing placement of a 20 cm temporary Trialysis catheter with tip ultimately terminating within the superior aspect of the right atrium. Final catheter positioning was confirmed and documented with a spot radiographic image. The catheter aspirates and flushes normally. The catheter was flushed with appropriate volume heparin dwells. The catheter exit site was secured with a 0 silk retention suture. A dressing was placed. The patient tolerated the procedure well without immediate post procedural complication. IMPRESSION: Successful placement of a right internal jugular approach 20 cm temporary dialysis catheter with tip terminating with in the superior aspect of the right atrium. The catheter is ready for  immediate use. PLAN: This catheter may be converted to a tunneled dialysis catheter at a later date as indicated. Ruthann Cancer, MD [facility] Radiology Electronically Signed   By: Ruthann Cancer M.D.   On: 01/18/2022 10:04   US Abdomen Limited RUQ (LIVER/GB)  Result Date: 01/17/2022 CLINICAL DATA:  Elevated LFTs EXAM: ULTRASOUND ABDOMEN LIMITED RIGHT UPPER QUADRANT COMPARISON:  None Available. FINDINGS: Gallbladder: No gallstones or wall thickening visualized. No sonographic Percell Miller  sign noted by sonographer. Common bile duct: Diameter: 3.7 mm Liver: No focal lesion identified. Within normal limits in parenchymal echogenicity. Portal vein is patent on color Doppler imaging with normal direction of blood flow towards the liver. Other: None. IMPRESSION: Normal right upper quadrant ultrasound. Electronically Signed   By: Ronney Asters M.D.   On: 01/17/2022 19:30   ECHOCARDIOGRAM COMPLETE  Result Date: 01/17/2022    ECHOCARDIOGRAM REPORT   Patient Name:   COLEY KULIKOWSKI Date of Exam: 01/17/2022 Medical Rec #:  [id number]      Height:       71.0 in Accession #:    [id number]     Weight:       272.9 lb Date of Birth:  11-15-1955       BSA:          2.406 m Patient Age:    66 years       BP:           123/65 mmHg Patient Gender: M              HR:           84 bpm. Exam Location:  Inpatient Procedure: 2D Echo, Cardiac Doppler, Color Doppler and Intracardiac            Opacification Agent Indications:    Abnormal CXR  History:        Patient has prior history of Echocardiogram examinations, most                 recent 04/20/2019. CHF; Risk Factors:Hypertension, Diabetes and                 Dyslipidemia.  Sonographer:    Jefferey Pica Referring Phys: [id number] A CALDWELL POWELL JR IMPRESSIONS  1. Left ventricular ejection fraction, by estimation, is 55 to 60%. The left ventricle has normal function. The left ventricle has no regional wall motion abnormalities. There is  mild concentric left ventricular hypertrophy. Left ventricular diastolic parameters were normal.  2. Right ventricular systolic function is normal. The right ventricular size is normal. There is normal pulmonary artery systolic pressure.  3. The mitral valve is normal in structure. Trivial mitral valve regurgitation. No evidence of mitral stenosis.  4. The aortic valve is tricuspid. Aortic valve regurgitation is not visualized. No aortic stenosis is present.  5. The inferior vena cava is normal in size with greater than 50% respiratory variability, suggesting right atrial pressure of 3 mmHg. FINDINGS  Left Ventricle: Left ventricular ejection fraction, by estimation, is 55 to 60%. The left ventricle has normal function. The left ventricle has no regional wall motion abnormalities. The left ventricular internal cavity size was normal in size. There is  mild concentric left ventricular hypertrophy. Left ventricular diastolic parameters were normal. Right Ventricle: The right ventricular size is normal. No increase in right ventricular wall thickness. Right ventricular systolic function is normal. There is normal pulmonary artery systolic pressure. The tricuspid regurgitant velocity is 2.74 m/s, and  with an assumed right atrial pressure of 5 mmHg, the estimated right ventricular systolic pressure is 22.6 mmHg. Left Atrium: Left atrial size was normal in size. Right Atrium: Right atrial size was normal in size. Pericardium: There is no evidence of pericardial effusion. Mitral Valve: The mitral valve is normal in structure. Trivial mitral valve regurgitation. No evidence of mitral valve stenosis. Tricuspid Valve: The tricuspid valve is normal in structure. Tricuspid valve regurgitation is trivial. No evidence of tricuspid stenosis. Aortic  Valve: The aortic valve is tricuspid. Aortic valve regurgitation is not visualized. No aortic stenosis is present. Aortic valve peak gradient measures 8.9 mmHg. Pulmonic Valve: The  pulmonic valve was not well visualized. Pulmonic valve regurgitation is not visualized. No evidence of pulmonic stenosis. Aorta: The aortic root is normal in size and structure. Venous: The inferior vena cava is normal in size with greater than 50% respiratory variability, suggesting right atrial pressure of 3 mmHg. IAS/Shunts: No atrial level shunt detected by color flow Doppler.  LEFT VENTRICLE PLAX 2D LVIDd:         5.50 cm   Diastology LVIDs:         3.70 cm   LV e' medial:    7.49 cm/s LV PW:         1.20 cm   LV E/e' medial:  13.0 LV IVS:        1.20 cm   LV e' lateral:   7.56 cm/s LVOT diam:     2.00 cm   LV E/e' lateral: 12.9 LV SV:         82 LV SV Index:   34 LVOT Area:     3.14 cm  RIGHT VENTRICLE             IVC RV S prime:     12.80 cm/s  IVC diam: 2.00 cm TAPSE (M-mode): 2.6 cm LEFT ATRIUM           Index        RIGHT ATRIUM           Index LA diam:      4.10 cm 1.70 cm/m   RA Area:     17.00 cm LA Vol (A4C): 68.8 ml 28.59 ml/m  RA Volume:   39.30 ml  16.33 ml/m  AORTIC VALVE                 PULMONIC VALVE AV Area (Vmax): 2.99 cm     PV Vmax:       0.89 m/s AV Vmax:        149.00 cm/s  PV Peak grad:  3.2 mmHg AV Peak Grad:   8.9 mmHg LVOT Vmax:      142.00 cm/s LVOT Vmean:     78.300 cm/s LVOT VTI:       0.260 m  AORTA Ao Root diam: 3.50 cm Ao Asc diam:  3.40 cm MITRAL VALVE               TRICUSPID VALVE MV Area (PHT): 7.09 cm    TR Peak grad:   30.0 mmHg MV Decel Time: 107 msec    TR Vmax:        274.00 cm/s MV E velocity: 97.30 cm/s MV A velocity: 47.70 cm/s  SHUNTS MV E/A ratio:  2.04        Systemic VTI:  0.26 m                            Systemic Diam: 2.00 cm Kardie Tobb DO Electronically signed by Berniece Salines DO Signature Date/Time: 01/17/2022/11:57:08 AM    Final    US RENAL  Result Date: 01/16/2022 CLINICAL DATA:  AKI. EXAM: RENAL / URINARY TRACT ULTRASOUND COMPLETE COMPARISON:  08/17/2021. FINDINGS: Right Kidney: Renal measurements: 10.5 x 5.4 x 4.8 cm = volume: 142.05 mL.  Echogenicity within normal limits. No mass or hydronephrosis visualized. Left Kidney: Renal measurements: 10.3 x 5.9 x  4.6 cm = volume: 146.72 mL. Echogenicity within normal limits. An avascular hypoechoic region is noted in the mid left kidney measuring 2.5 x 1.7 cm, possible cyst. No mass or hydronephrosis visualized. Bladder: Appears normal for degree of bladder distention. Other: Examination is limited due to patient's body habitus and limited mobility. IMPRESSION: 1. Avascular hypoechoic structure in the mid left kidney, possible cyst. 2. Normal evaluation of the right kidney and bladder. Electronically Signed   By: Brett Fairy M.D.   On: 01/16/2022 20:16   MR BRAIN WO CONTRAST  Result Date: 01/16/2022 CLINICAL DATA:  Neuro deficit, acute, stroke suspected. EXAM: MRI HEAD WITHOUT CONTRAST TECHNIQUE: Multiplanar, multiecho pulse sequences of the brain and surrounding structures were obtained without intravenous contrast. COMPARISON:  01/15/2022 CT FINDINGS: Brain: Diffusion imaging does not show any acute or subacute infarction or other cause of restricted diffusion. There is generalized brain atrophy, advanced for age. Old small vessel infarctions affect the central pons. Few old small vessel cerebellar infarctions. Cerebral hemispheres show old infarctions of the left basal ganglia and radiating white matter tracts. Mild chronic small-vessel ischemic change seen elsewhere. No large vessel territory infarction. No mass, hemorrhage, hydrocephalus or extra-axial collection. Vascular: Major vessels at the base of the brain show flow. Skull and upper cervical spine: Negative Sinuses/Orbits: Clear/normal Other: None IMPRESSION: No acute MR finding. Accelerated generalized atrophy. 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 Electronically Signed   By: Nelson Chimes M.D.   On: 01/16/2022  16:12   DG Hand 2 View Left  Result Date: 01/16/2022 CLINICAL DATA:  Pain and swelling after falling from bed. EXAM: LEFT HAND - 2 VIEW COMPARISON:  None Available. FINDINGS: Generalized regional swelling. No evidence fracture or dislocation. Small erosions are noted at several of the inter phalangeal joints which could be seen with erosive arthritis including erosive osteoarthritis. IMPRESSION: No acute finding other than soft tissue swelling. 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 Electronically Signed   By: Nelson Chimes M.D.   On: 01/16/2022 15:07   DG Chest 1 View  Result Date: 01/16/2022 CLINICAL DATA:  Sepsis. EXAM: CHEST  1 VIEW COMPARISON:  01/15/2022 FINDINGS: 0505 hours. The cardio pericardial silhouette is enlarged. Low volume film. Bibasilar atelectasis noted. There is pulmonary vascular congestion without overt pulmonary edema. Telemetry leads overlie the chest. IMPRESSION: Enlarged cardiopericardial silhouette with vascular congestion. Electronically Signed   By: Misty Stanley M.D.   On: 01/16/2022 06:00   DG Knee Complete 4 Views Right  Result Date: 01/15/2022 CLINICAL DATA:  Fall, pain and swelling, initial encounter. EXAM: RIGHT KNEE - COMPLETE 4+ VIEW COMPARISON:  None Available. FINDINGS: Large joint effusion. No fracture. Slight varus angulation of the knee joint with minimal lateral subluxation of the proximal tibia with respect to the distal femur. Marked loss of joint space in the medial compartment with subchondral sclerosis and osteophytosis. Additional osteophytosis in the medial and patellofemoral compartments. IMPRESSION: 1. Moderate to large joint effusion.  No fracture. 2. 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 Electronically Signed   By: Lorin Picket M.D.   On: 01/15/2022 13:53   CT Head Wo Contrast  Result Date: 01/15/2022 CLINICAL DATA:  Fall off a bed on Friday. EXAM: CT HEAD WITHOUT CONTRAST  TECHNIQUE: Contiguous axial images were obtained from the base of the skull through the vertex without intravenous contrast. RADIATION DOSE REDUCTION: This exam  was performed according to the departmental dose-optimization program which includes automated exposure control, adjustment of the mA and/or kV according to patient size and/or use of iterative reconstruction technique. COMPARISON:  None Available. FINDINGS: Brain: There is no acute intracranial hemorrhage, extra-axial fluid collection, or acute infarct. There is mild volume loss primarily affecting the frontal lobes. The ventricles are normal in size. Gray-white differentiation is preserved. There is no mass lesion.  There is no mass effect or midline shift. Vascular: There is calcification of the bilateral cavernous ICAs. Skull: Normal. Negative for fracture or focal lesion. Sinuses/Orbits: The paranasal sinuses are clear. The globes and orbits are unremarkable. Other: None. IMPRESSION: No acute intracranial pathology. Electronically Signed   By: Valetta Mole M.D.   On: 01/15/2022 12:30   DG Chest Port 1 View  Result Date: 01/15/2022 CLINICAL DATA:  Questionable sepsis. EXAM: PORTABLE CHEST 1 VIEW COMPARISON:  Chest radiograph dated April 20, 2008 FINDINGS: The heart is mildly enlarged. Chronic elevation of the right hemidiaphragm. Lungs are otherwise clear without evidence of focal consolidation or large pleural effusion. IMPRESSION: 1.  Stable mild cardiomegaly. 2. Chronic elevation of the right hemidiaphragm. No evidence of pneumonia or pleural effusion. Electronically Signed   By: Keane Police D.O.   On: 01/15/2022 10:54       Today   Subjective    [name] today has no headache,no chest abdominal pain,no new weakness tingling or numbness, +ve L.Knee pain.   Objective   Blood pressure (!) 105/50, pulse 61, temperature 98.5 F (36.9 C), temperature source Oral, resp. rate (!) 22, height 5' 11" (1.803 m), weight 108.9 kg, SpO2  100 %.   Intake/Output Summary (Last 24 hours) at 02/02/2022 1504 Last data filed at 02/02/2022 1320 Gross per 24 hour  Intake 240 ml  Output 3.5 ml  Net 236.5 ml    Exam  Awake Alert, No new F.N deficits,    [place].AT,PERRAL Supple Neck,   Symmetrical Chest wall movement, Good air movement bilaterally, CTAB RRR,No Gallops,   +ve B.Sounds, Abd Soft, Non tender,  No Cyanosis, Clubbing or edema    Data Review   Recent Labs  Lab 01/27/22 0744 01/28/22 0258 01/29/22 0800 02/01/22 0524 02/02/22 0808  WBC 17.3* 17.0* 16.0* 15.4* 14.5*  HGB 7.9* 8.5* 8.4* 8.9* 8.8*  HCT 25.1* 26.5* 26.4* 27.0* 27.5*  PLT 309 333 335 308 301  MCV 87.8 88.0 88.9 85.2 87.6  MCH 27.6 28.2 28.3 28.1 28.0  MCHC 31.5 32.1 31.8 33.0 32.0  RDW 15.1 14.9 14.9 14.9 15.2  LYMPHSABS  --   --   --  1.3 1.3  MONOABS  --   --   --  1.3* 1.4*  EOSABS  --   --   --  0.0 0.1  BASOSABS  --   --   --  0.0 0.0    Recent Labs  Lab 01/27/22 0745 01/28/22 0258 01/29/22 0800 01/31/22 1407 02/01/22 0524 02/01/22 1609 02/02/22 0808  NA 131* 134* 134*  --  134*  --  136  K 4.1 3.7 3.8  --  4.6  --  4.2  CL 93* 94* 96*  --  94*  --  96*  CO2 _0 --  28  --  26  GLUCOSE 278* 179* 103*  --  268*  --  72  BUN 116* 69* 112*  --  80*  --  110*  CREATININE 7.80* 5.61* 7.28*  --  5.76*  --  6.81*  CALCIUM 8.3* 8.2* 8.1*  --  8.8*  --  9.0  AST  --   --   --  21 20  --   --   ALT  --   --   --  16 15  --   --   ALKPHOS  --   --   --  87 73  --   --   BILITOT  --   --   --  0.7 0.6  --   --   ALBUMIN 1.6* 1.7* 1.8* 2.0* 1.8*  --  1.8*  PHOS 8.7* 6.4* 8.0*  --  4.8*  --  4.2  AMMONIA  --   --   --   --   --  12  --     Total Time in preparing paper work, data evaluation and todays exam - 35 minutes  Lala Lund M.D on 02/02/2022 at 3:04 PM  [facility]

## 2022-02-14 NOTE — Progress Notes (Deleted)
Cardiology Office Note:    Date:  02/14/2022   ID:  [name], DOB 01/31/1956, MRN [id number]  PCP:  Sonia Side., [place] Providers Cardiologist:  Sanda Klein, MD { Click to update primary MD,subspecialty MD or APP then REFRESH:1}    Referring MD: Sonia Side., FNP   No chief complaint on file. ***  History of Present Illness:    [name] is a 66 y.o. male with a hx of ESRD on HD, IDDM, HFpEF, hypertension, hyperlipidemia, prostate cancer, and PAT and new A-fib.   Patient previously followed by Dr. Caryl Comes for paroxysmal atrial tachycardia.  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  Echocardiogram in 2013 with an LVEF 55 to 60%.  He was lost to follow-up after 2013.  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  Her course was complicated by A-fib RVR and lower GI bleeding.  He had A-fib RVR with his first HD session.  Continuously converted to sinus rhythm.  He had bilateral knee arthrocenteses and blood cultures were negative. Cardiology was consulted for new onset Afib RVR. Echo showed LVEF 55-60%, no RWMA, mild LVH, normal RV, and no significant valvular disease. He was treated with cardizem.  Hematochezia and oozing from his HD tunneled catheter site site stabilized and he was discharged on 5 mg Eliquis twice daily.  Presents today for hospital follow-up.       Paroxysmal atrial fibrillation - heart monitor    Chronic anticoagulation     Chronic diastolic heart failure      Past Medical History:  Diagnosis Date   Arthritis    Cardiomyopathy    Resolved with EF 55% 2011   CARDIOMYOPATHY 04/22/2006   CARDIOMYOPATHY 04/22/2006   2- D Echo (04/2010) : The EF is probably 55% with some hypokinesis at the base of the inferior  wall. Wall thickness was increased in a pattern of mild LVH. Patient followed by Dr [place] (Cardiology)      CHF (congestive heart failure) ([place])    Chronic kidney disease    Diabetes mellitus    DIABETIC  RETINOPATHY 02/06/2007   GERD (gastroesophageal reflux disease)    Hyperlipemia    Hypertension    Iron deficiency 04/09/2018   NEPHROPATHY, DIABETIC 06/08/2006   Paroxysmal atrial tachycardia ([place])     Past Surgical History:  Procedure Laterality Date   AV FISTULA PLACEMENT Left 06/06/2021   Procedure: LEFT BRACHIOCEPHALIC ARTERIOVENOUS (AV) FISTULA CREATION;  Surgeon: Broadus John, MD;  Location: [facility];  Service: Vascular;  Laterality: Left;  PERIPHERAL NERVE BLOCK   CARDIAC CATHETERIZATION  Dec 2007   normal; EF- 20-25%   FISTULA SUPERFICIALIZATION Left 01/09/2022   Procedure: LEFT BRACHIOCEPHALIC FISTULA SUPERFICIALIZATION;  Surgeon: Broadus John, MD;  Location: [facility] [facility];  Service: Vascular;  Laterality: Left;  PERIPHERAL NERVE BLOCK   IR FLUORO GUIDE CV LINE RIGHT  01/18/2022   IR FLUORO GUIDE CV LINE RIGHT  01/24/2022   IR US GUIDE VASC ACCESS RIGHT  01/18/2022    Current Medications: No outpatient medications have been marked as taking for the 02/19/22 encounter (Appointment) with [name], [place].     Allergies:   Patient has no known allergies.   Social History   Socioeconomic History   Marital status: Single  Spouse name: Not on file   Number of children: Not on file   Years of education: Not on file   Highest education level: Not on file  Occupational History   Not on file  Tobacco Use   Smoking status: Never    Passive exposure: Never   Smokeless tobacco: Never  Vaping Use   Vaping Use: Never used  Substance and Sexual Activity   Alcohol use: No    Alcohol/week: 0.0 standard drinks of alcohol   Drug use: No   Sexual activity: Not on file  Other Topics Concern   Not on file  Social History Narrative   Not on file   Social Determinants of  Health   Financial Resource Strain: Not on file  Food Insecurity: Not on file  Transportation Needs: Not on file  Physical Activity: Not on file  Stress: Not on file  Social Connections: Not on file     Family History: The patient's ***family history includes Diabetes in his brother and mother.  ROS:   Please see the history of present illness.    *** All other systems reviewed and are negative.  EKGs/Labs/Other Studies Reviewed:    The following studies were reviewed today:  ECHO 01/17/2022    1. Left ventricular ejection fraction, by estimation, is 55 to 60%. The  left ventricle has normal function. The left ventricle has no regional  wall motion abnormalities. There is mild concentric left ventricular  hypertrophy. Left ventricular diastolic  parameters were normal.   2. Right ventricular systolic function is normal. The right ventricular  size is normal. There is normal pulmonary artery systolic pressure.   3. The mitral valve is normal in structure. Trivial mitral valve  regurgitation. No evidence of mitral stenosis.   4. The aortic valve is tricuspid. Aortic valve regurgitation is not  visualized. No aortic stenosis is present.   5. The inferior vena cava is normal in size with greater than 50%  respiratory variability, suggesting right atrial pressure of 3 mmHg.   EKG:  EKG is *** ordered today.  The ekg ordered today demonstrates ***  Recent Labs: 01/15/2022: B Natriuretic Peptide 497.7; TSH 0.874 01/25/2022: Magnesium 1.9 02/01/2022: ALT 15 02/02/2022: BUN 110; Creatinine, Ser 6.81; Hemoglobin 8.8; Platelets 301; Potassium 4.2; Sodium 136  Recent Lipid Panel    Component Value Date/Time   CHOL 181 10/03/2017 1435   TRIG 248 (H) 10/03/2017 1435   HDL 46 10/03/2017 1435   CHOLHDL 3.9 10/03/2017 1435   CHOLHDL 4.4 06/14/2014 1402   VLDL 39 06/14/2014 1402   LDLCALC 85 10/03/2017 1435   LDLDIRECT 247.6 06/18/2007 1000     Risk Assessment/Calculations:    {Does this patient have ATRIAL FIBRILLATION?:[phone]}  No BP recorded.  {Refresh Note OR Click here to enter BP  :1}***         Physical Exam:    VS:  There were no vitals taken for this visit.    Wt Readings from Last 3 Encounters:  02/02/22 240 lb (108.9 kg)  01/09/22 265 lb (120.2 kg)  07/21/21 263 lb (119.3 kg)     GEN: *** Well nourished, well developed in no acute distress HEENT: Normal NECK: No JVD; No carotid bruits LYMPHATICS: No lymphadenopathy CARDIAC: ***RRR, no murmurs, rubs, gallops RESPIRATORY:  Clear to auscultation without rales, wheezing or rhonchi  ABDOMEN: Soft, non-tender, non-distended MUSCULOSKELETAL:  No edema; No deformity  SKIN: Warm and dry NEUROLOGIC:  Alert and oriented x 3 PSYCHIATRIC:  Normal affect  ASSESSMENT:    No diagnosis found. PLAN:    In order of problems listed above:  ***      {Are you ordering a CV Procedure (e.g. stress test, cath, DCCV, TEE, etc)?   Press F2        :[id number]}    Medication Adjustments/Labs and Tests Ordered: Current medicines are reviewed at length with the patient today.  Concerns regarding medicines are outlined above.  No orders of the defined types were placed in this encounter.  No orders of the defined types were placed in this encounter.   There are no Patient Instructions on file for this visit.   Signed, [name], [place]  02/14/2022 6:56 AM    [place]

## 2022-02-18 ENCOUNTER — Encounter: Payer: Self-pay | Admitting: Physician Assistant

## 2022-02-18 ENCOUNTER — Telehealth: Payer: Self-pay

## 2022-02-18 NOTE — Telephone Encounter (Signed)
Left a detailed message need to cancel 8am 9/18 appt. Can offer one of the acute spots on DOD ([place] today if able to come) other wise reschedule next available as he is post hosptial f/u Also sent patient MyChart message.

## 2022-02-19 ENCOUNTER — Ambulatory Visit: Payer: Medicare Other | Admitting: Physician Assistant

## 2022-03-01 NOTE — Progress Notes (Deleted)
  POST OPERATIVE OFFICE NOTE    CC:  F/u for surgery  HPI:  This is a 66 y.o. male who is s/p 8/823 superficialization of the left UE BC AV fistula  by Dr. Virl Cagey.  He has a TDC placed by IR.    Pt returns today for follow up.  Pt states ***   No Known Allergies  Current Outpatient Medications  Medication Sig Dispense Refill   abiraterone acetate (ZYTIGA) 250 MG tablet Take 1,000 mg by mouth daily.     acetaminophen (TYLENOL) 500 MG tablet Take 500-1,000 mg by mouth every 6 (six) hours as needed for moderate pain.     apixaban (ELIQUIS) 5 MG TABS tablet Take 1 tablet (5 mg total) by mouth 2 (two) times daily. 60 tablet    atorvastatin (LIPITOR) 80 MG tablet Take 1 tablet (80 mg total) by mouth daily. 90 tablet 3   BAYER MICROLET LANCETS lancets Check blood sugar 3 times a day as instructed 100 each 12   Blood Glucose Monitoring Suppl (ONE TOUCH ULTRA MINI) w/Device KIT 1 strip by Other route 3 (three) times daily. 1 each 11   calcitRIOL (ROCALTROL) 0.25 MCG capsule Take 0.25 mcg by mouth daily.     calcium acetate (PHOSLO) 667 MG capsule Take 2 capsules (1,334 mg total) by mouth 3 (three) times daily with meals.     carvedilol (COREG) 25 MG tablet TAKE 1 TABLET BY MOUTH TWICE DAILY WITH A MEAL 180 tablet 0   glucose blood (ONE TOUCH ULTRA TEST) test strip The patient is insulin requiring, ICD 10 code E11.65. The patient tests 3 times per day. 100 each 12   insulin lispro (HUMALOG) 100 UNIT/ML KwikPen Before each meal 3 times a day, 140-199 - 2 units, 200-250 - 4 units, 251-299 - 8 units,  300-349 - 12 units,  350 or above 15 units.     Insulin Pen Needle 31G X 6 MM MISC 1 Stick by Other route 3 (three) times daily. Use with insulin and liraglutide. 100 each 3   LEVEMIR 100 UNIT/ML injection Inject 0.3 mLs (30 Units total) into the skin 2 (two) times daily. 10 mL 0   multivitamin (RENA-VIT) TABS tablet Take 1 tablet by mouth at bedtime.  0   oxyCODONE (OXY IR/ROXICODONE) 5 MG immediate  release tablet Take 1 tablet (5 mg total) by mouth every 6 (six) hours as needed for severe pain. 5 tablet 0   pantoprazole (PROTONIX) 40 MG tablet Take 1 tablet (40 mg total) by mouth 2 (two) times daily.     predniSONE (DELTASONE) 5 MG tablet Take 5 mg by mouth daily.     No current facility-administered medications for this visit.     ROS:  See HPI  Physical Exam:  ***  Incision:  *** Extremities:  *** Neuro: *** Abdomen:  ***    Assessment/Plan:  This is a 66 y.o. male who is s/p: ***  -***   Leontine Locket, [facility] [phone]   Clinic MD:  ***

## 2022-03-04 DEATH — deceased

## 2022-03-21 ENCOUNTER — Telehealth: Payer: Self-pay | Admitting: Podiatry

## 2022-03-21 NOTE — Telephone Encounter (Signed)
Left message for pt to call with updated insurance as the current one [facility] mcr is showing as inactive.

## 2022-03-28 ENCOUNTER — Ambulatory Visit: Payer: Medicare Other | Admitting: Podiatry
